# Patient Record
Sex: Female | Born: 1945
Health system: Southern US, Community
[De-identification: ages and names within clinical notes are randomized; demographics above are authoritative.]

## PROBLEM LIST (undated history)

## (undated) DIAGNOSIS — R112 Nausea with vomiting, unspecified: Secondary | ICD-10-CM

## (undated) DIAGNOSIS — F32A Depression, unspecified: Secondary | ICD-10-CM

## (undated) DIAGNOSIS — M1990 Unspecified osteoarthritis, unspecified site: Secondary | ICD-10-CM

## (undated) DIAGNOSIS — R7303 Prediabetes: Secondary | ICD-10-CM

## (undated) DIAGNOSIS — M47812 Spondylosis without myelopathy or radiculopathy, cervical region: Secondary | ICD-10-CM

## (undated) DIAGNOSIS — G2581 Restless legs syndrome: Secondary | ICD-10-CM

## (undated) DIAGNOSIS — L0390 Cellulitis, unspecified: Secondary | ICD-10-CM

## (undated) DIAGNOSIS — F329 Major depressive disorder, single episode, unspecified: Secondary | ICD-10-CM

## (undated) DIAGNOSIS — R110 Nausea: Secondary | ICD-10-CM

## (undated) DIAGNOSIS — Z8719 Personal history of other diseases of the digestive system: Secondary | ICD-10-CM

## (undated) DIAGNOSIS — G43909 Migraine, unspecified, not intractable, without status migrainosus: Secondary | ICD-10-CM

## (undated) DIAGNOSIS — M8580 Other specified disorders of bone density and structure, unspecified site: Secondary | ICD-10-CM

## (undated) DIAGNOSIS — Z9889 Other specified postprocedural states: Secondary | ICD-10-CM

## (undated) DIAGNOSIS — K219 Gastro-esophageal reflux disease without esophagitis: Secondary | ICD-10-CM

## (undated) DIAGNOSIS — E785 Hyperlipidemia, unspecified: Secondary | ICD-10-CM

## (undated) DIAGNOSIS — I700 Atherosclerosis of aorta: Secondary | ICD-10-CM

## (undated) DIAGNOSIS — E559 Vitamin D deficiency, unspecified: Secondary | ICD-10-CM

## (undated) DIAGNOSIS — T7840XA Allergy, unspecified, initial encounter: Secondary | ICD-10-CM

## (undated) DIAGNOSIS — F419 Anxiety disorder, unspecified: Secondary | ICD-10-CM

## (undated) DIAGNOSIS — R569 Unspecified convulsions: Secondary | ICD-10-CM

## (undated) DIAGNOSIS — C4490 Unspecified malignant neoplasm of skin, unspecified: Secondary | ICD-10-CM

## (undated) DIAGNOSIS — R031 Nonspecific low blood-pressure reading: Secondary | ICD-10-CM

## (undated) DIAGNOSIS — M5030 Other cervical disc degeneration, unspecified cervical region: Secondary | ICD-10-CM

## (undated) DIAGNOSIS — R55 Syncope and collapse: Secondary | ICD-10-CM

## (undated) DIAGNOSIS — Z87898 Personal history of other specified conditions: Secondary | ICD-10-CM

## (undated) DIAGNOSIS — H269 Unspecified cataract: Secondary | ICD-10-CM

## (undated) DIAGNOSIS — Z860100 Personal history of colon polyps, unspecified: Secondary | ICD-10-CM

## (undated) DIAGNOSIS — G9340 Encephalopathy, unspecified: Secondary | ICD-10-CM

## (undated) DIAGNOSIS — S22080A Wedge compression fracture of T11-T12 vertebra, initial encounter for closed fracture: Secondary | ICD-10-CM

## (undated) DIAGNOSIS — C4492 Squamous cell carcinoma of skin, unspecified: Secondary | ICD-10-CM

## (undated) DIAGNOSIS — I680 Cerebral amyloid angiopathy: Secondary | ICD-10-CM

## (undated) DIAGNOSIS — A048 Other specified bacterial intestinal infections: Secondary | ICD-10-CM

## (undated) DIAGNOSIS — G459 Transient cerebral ischemic attack, unspecified: Secondary | ICD-10-CM

## (undated) DIAGNOSIS — K648 Other hemorrhoids: Secondary | ICD-10-CM

## (undated) DIAGNOSIS — D649 Anemia, unspecified: Secondary | ICD-10-CM

## (undated) HISTORY — DX: Depression, unspecified: F32.A

## (undated) HISTORY — DX: Hyperlipidemia, unspecified: E78.5

## (undated) HISTORY — DX: Restless legs syndrome: G25.81

## (undated) HISTORY — DX: Migraine, unspecified, not intractable, without status migrainosus: G43.909

## (undated) HISTORY — DX: Unspecified osteoarthritis, unspecified site: M19.90

## (undated) HISTORY — DX: Anemia, unspecified: D64.9

## (undated) HISTORY — DX: Major depressive disorder, single episode, unspecified: F32.9

## (undated) HISTORY — DX: Anxiety disorder, unspecified: F41.9

## (undated) HISTORY — DX: Allergy, unspecified, initial encounter: T78.40XA

## (undated) HISTORY — DX: Vitamin D deficiency, unspecified: E55.9

---
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
## 1997-11-17 ENCOUNTER — Other Ambulatory Visit: Admission: RE | Admit: 1997-11-17 | Discharge: 1997-11-17 | Payer: Self-pay | Admitting: *Deleted

## 1999-06-23 ENCOUNTER — Ambulatory Visit (HOSPITAL_COMMUNITY): Admission: RE | Admit: 1999-06-23 | Discharge: 1999-06-23 | Payer: Self-pay | Admitting: Internal Medicine

## 1999-06-23 ENCOUNTER — Encounter: Payer: Self-pay | Admitting: Internal Medicine

## 2000-06-20 ENCOUNTER — Other Ambulatory Visit: Admission: RE | Admit: 2000-06-20 | Discharge: 2000-06-20 | Payer: Self-pay | Admitting: Internal Medicine

## 2000-07-17 ENCOUNTER — Ambulatory Visit (HOSPITAL_COMMUNITY): Admission: RE | Admit: 2000-07-17 | Discharge: 2000-07-17 | Payer: Self-pay | Admitting: Internal Medicine

## 2000-07-17 ENCOUNTER — Encounter: Payer: Self-pay | Admitting: Internal Medicine

## 2000-07-25 ENCOUNTER — Ambulatory Visit (HOSPITAL_COMMUNITY): Admission: RE | Admit: 2000-07-25 | Discharge: 2000-07-25 | Payer: Self-pay | Admitting: Orthopaedic Surgery

## 2000-10-09 ENCOUNTER — Ambulatory Visit (HOSPITAL_COMMUNITY): Admission: RE | Admit: 2000-10-09 | Discharge: 2000-10-09 | Payer: Self-pay | Admitting: Plastic Surgery

## 2003-11-25 ENCOUNTER — Ambulatory Visit (HOSPITAL_COMMUNITY): Admission: RE | Admit: 2003-11-25 | Discharge: 2003-11-25 | Payer: Self-pay | Admitting: Internal Medicine

## 2004-08-23 ENCOUNTER — Other Ambulatory Visit: Admission: RE | Admit: 2004-08-23 | Discharge: 2004-08-23 | Payer: Self-pay | Admitting: Internal Medicine

## 2006-10-12 ENCOUNTER — Ambulatory Visit (HOSPITAL_COMMUNITY): Admission: RE | Admit: 2006-10-12 | Discharge: 2006-10-12 | Payer: Self-pay | Admitting: Internal Medicine

## 2006-10-23 ENCOUNTER — Other Ambulatory Visit: Admission: RE | Admit: 2006-10-23 | Discharge: 2006-10-23 | Payer: Self-pay | Admitting: Internal Medicine

## 2006-11-02 ENCOUNTER — Ambulatory Visit (HOSPITAL_COMMUNITY): Admission: RE | Admit: 2006-11-02 | Discharge: 2006-11-02 | Payer: Self-pay | Admitting: Internal Medicine

## 2006-11-24 LAB — HM COLONOSCOPY

## 2007-03-25 ENCOUNTER — Emergency Department (HOSPITAL_COMMUNITY): Admission: EM | Admit: 2007-03-25 | Discharge: 2007-03-25 | Payer: Self-pay | Admitting: Emergency Medicine

## 2007-11-20 ENCOUNTER — Ambulatory Visit (HOSPITAL_COMMUNITY): Admission: RE | Admit: 2007-11-20 | Discharge: 2007-11-20 | Payer: Self-pay | Admitting: Internal Medicine

## 2008-04-17 HISTORY — PX: SPINE SURGERY: SHX786

## 2008-11-23 ENCOUNTER — Ambulatory Visit (HOSPITAL_COMMUNITY): Admission: RE | Admit: 2008-11-23 | Discharge: 2008-11-23 | Payer: Self-pay | Admitting: Internal Medicine

## 2008-12-02 ENCOUNTER — Other Ambulatory Visit: Admission: RE | Admit: 2008-12-02 | Discharge: 2008-12-02 | Payer: Self-pay | Admitting: Internal Medicine

## 2008-12-16 DIAGNOSIS — M5030 Other cervical disc degeneration, unspecified cervical region: Secondary | ICD-10-CM

## 2008-12-16 DIAGNOSIS — M47812 Spondylosis without myelopathy or radiculopathy, cervical region: Secondary | ICD-10-CM

## 2008-12-16 HISTORY — DX: Spondylosis without myelopathy or radiculopathy, cervical region: M47.812

## 2008-12-16 HISTORY — DX: Other cervical disc degeneration, unspecified cervical region: M50.30

## 2008-12-20 ENCOUNTER — Encounter: Admission: RE | Admit: 2008-12-20 | Discharge: 2008-12-20 | Payer: Self-pay | Admitting: Internal Medicine

## 2008-12-29 ENCOUNTER — Encounter: Admission: RE | Admit: 2008-12-29 | Discharge: 2008-12-29 | Payer: Self-pay | Admitting: Neurosurgery

## 2009-01-07 ENCOUNTER — Ambulatory Visit (HOSPITAL_COMMUNITY): Admission: RE | Admit: 2009-01-07 | Discharge: 2009-01-08 | Payer: Self-pay | Admitting: Orthopedic Surgery

## 2009-11-23 LAB — HM PAP SMEAR: HM Pap smear: NEGATIVE

## 2010-07-22 LAB — BASIC METABOLIC PANEL
CO2: 30 mEq/L (ref 19–32)
Calcium: 9.6 mg/dL (ref 8.4–10.5)
Chloride: 100 mEq/L (ref 96–112)
Creatinine, Ser: 0.81 mg/dL (ref 0.4–1.2)
GFR calc non Af Amer: >60 mL/min (ref >60)
Glucose, Bld: 109 mg/dL — ABNORMAL HIGH (ref 70–99)
Potassium: 3.7 mEq/L (ref 3.5–5.1)
Sodium: 136 mEq/L (ref 135–145)

## 2010-07-22 LAB — CBC
MCHC: 34.3 g/dL (ref 30.0–36.0)
RBC: 4.13 MIL/uL (ref 3.87–5.11)
WBC: 6.2 10*3/uL (ref 4.0–10.5)

## 2011-10-27 ENCOUNTER — Other Ambulatory Visit (HOSPITAL_COMMUNITY): Payer: Self-pay | Admitting: Internal Medicine

## 2011-10-27 DIAGNOSIS — Z1231 Encounter for screening mammogram for malignant neoplasm of breast: Secondary | ICD-10-CM

## 2011-11-16 ENCOUNTER — Ambulatory Visit (HOSPITAL_COMMUNITY)
Admission: RE | Admit: 2011-11-16 | Discharge: 2011-11-16 | Disposition: A | Discharge status: Home / Self Care | Payer: Medicare Other | Source: Ambulatory Visit | Attending: Internal Medicine | Admitting: Internal Medicine

## 2011-11-16 DIAGNOSIS — Z1231 Encounter for screening mammogram for malignant neoplasm of breast: Secondary | ICD-10-CM | POA: Insufficient documentation

## 2011-11-24 LAB — HM MAMMOGRAPHY: HM Mammogram: NEGATIVE

## 2011-12-04 ENCOUNTER — Other Ambulatory Visit (HOSPITAL_COMMUNITY): Payer: Self-pay | Admitting: Internal Medicine

## 2011-12-04 ENCOUNTER — Ambulatory Visit (HOSPITAL_COMMUNITY)
Admission: RE | Admit: 2011-12-04 | Discharge: 2011-12-04 | Disposition: A | Discharge status: Home / Self Care | Payer: Medicare Other | Source: Ambulatory Visit | Attending: Internal Medicine | Admitting: Internal Medicine

## 2011-12-04 DIAGNOSIS — R05 Cough: Secondary | ICD-10-CM

## 2011-12-04 DIAGNOSIS — R110 Nausea: Secondary | ICD-10-CM | POA: Insufficient documentation

## 2011-12-04 DIAGNOSIS — R0989 Other specified symptoms and signs involving the circulatory and respiratory systems: Secondary | ICD-10-CM | POA: Insufficient documentation

## 2011-12-04 DIAGNOSIS — R059 Cough, unspecified: Secondary | ICD-10-CM | POA: Insufficient documentation

## 2012-02-22 ENCOUNTER — Ambulatory Visit
Admission: RE | Admit: 2012-02-22 | Discharge: 2012-02-22 | Disposition: A | Discharge status: Home / Self Care | Payer: Medicare Other | Source: Ambulatory Visit | Attending: Internal Medicine | Admitting: Internal Medicine

## 2012-02-22 ENCOUNTER — Other Ambulatory Visit: Payer: Self-pay | Admitting: Internal Medicine

## 2012-04-17 HISTORY — PX: CATARACT EXTRACTION, BILATERAL: SHX1313

## 2013-03-25 ENCOUNTER — Encounter: Payer: Self-pay | Admitting: Physician Assistant

## 2013-03-25 DIAGNOSIS — E559 Vitamin D deficiency, unspecified: Secondary | ICD-10-CM | POA: Insufficient documentation

## 2013-03-25 DIAGNOSIS — E785 Hyperlipidemia, unspecified: Secondary | ICD-10-CM | POA: Insufficient documentation

## 2013-03-25 DIAGNOSIS — I10 Essential (primary) hypertension: Secondary | ICD-10-CM | POA: Insufficient documentation

## 2013-03-25 DIAGNOSIS — R7303 Prediabetes: Secondary | ICD-10-CM

## 2013-03-27 ENCOUNTER — Ambulatory Visit: Payer: Self-pay | Admitting: Physician Assistant

## 2013-04-17 HISTORY — PX: MOHS SURGERY: SUR867

## 2013-04-22 ENCOUNTER — Other Ambulatory Visit: Payer: Self-pay | Admitting: Internal Medicine

## 2013-04-29 ENCOUNTER — Ambulatory Visit (INDEPENDENT_AMBULATORY_CARE_PROVIDER_SITE_OTHER): Payer: 59 | Admitting: Physician Assistant

## 2013-04-29 ENCOUNTER — Encounter: Payer: Self-pay | Admitting: Physician Assistant

## 2013-04-29 VITALS — BP 110/60 | HR 88 | Temp 97.7°F | Resp 16 | Ht 65.0 in | Wt 165.0 lb

## 2013-04-29 DIAGNOSIS — I10 Essential (primary) hypertension: Secondary | ICD-10-CM

## 2013-04-29 DIAGNOSIS — R7303 Prediabetes: Secondary | ICD-10-CM

## 2013-04-29 DIAGNOSIS — E559 Vitamin D deficiency, unspecified: Secondary | ICD-10-CM

## 2013-04-29 DIAGNOSIS — Z79899 Other long term (current) drug therapy: Secondary | ICD-10-CM

## 2013-04-29 DIAGNOSIS — R7309 Other abnormal glucose: Secondary | ICD-10-CM

## 2013-04-29 DIAGNOSIS — E785 Hyperlipidemia, unspecified: Secondary | ICD-10-CM

## 2013-04-29 LAB — CBC WITH DIFFERENTIAL/PLATELET
BASOS ABS: 0.1 10*3/uL (ref 0.0–0.1)
BASOS PCT: 1 % (ref 0–1)
EOS PCT: 4 % (ref 0–5)
Eosinophils Absolute: 0.2 10*3/uL (ref 0.0–0.7)
HEMATOCRIT: 36.3 % (ref 36.0–46.0)
HEMOGLOBIN: 12.4 g/dL (ref 12.0–15.0)
LYMPHS PCT: 33 % (ref 12–46)
Lymphs Abs: 1.8 10*3/uL (ref 0.7–4.0)
MCH: 31.1 pg (ref 26.0–34.0)
MCHC: 34.2 g/dL (ref 30.0–36.0)
MCV: 91 fL (ref 78.0–100.0)
MONO ABS: 0.5 10*3/uL (ref 0.1–1.0)
MONOS PCT: 9 % (ref 3–12)
Neutro Abs: 2.9 10*3/uL (ref 1.7–7.7)
Neutrophils Relative %: 53 % (ref 43–77)
Platelets: 269 10*3/uL (ref 150–400)
RBC: 3.99 MIL/uL (ref 3.87–5.11)
RDW: 13.1 % (ref 11.5–15.5)
WBC: 5.4 10*3/uL (ref 4.0–10.5)

## 2013-04-29 LAB — LIPID PANEL
Cholesterol: 215 mg/dL — ABNORMAL HIGH (ref 0–200)
HDL: 55 mg/dL (ref >39)
LDL CALC: 112 mg/dL — AB (ref 0–99)
Total CHOL/HDL Ratio: 3.9 Ratio
Triglycerides: 242 mg/dL — ABNORMAL HIGH (ref <150)
VLDL: 48 mg/dL — ABNORMAL HIGH (ref 0–40)

## 2013-04-29 LAB — BASIC METABOLIC PANEL WITH GFR
BUN: 12 mg/dL (ref 6–23)
CHLORIDE: 103 meq/L (ref 96–112)
CO2: 25 mEq/L (ref 19–32)
CREATININE: 0.88 mg/dL (ref 0.50–1.10)
Calcium: 9.1 mg/dL (ref 8.4–10.5)
GFR, EST NON AFRICAN AMERICAN: 68 mL/min
GFR, Est African American: 79 mL/min
GLUCOSE: 86 mg/dL (ref 70–99)
POTASSIUM: 4 meq/L (ref 3.5–5.3)
Sodium: 136 mEq/L (ref 135–145)

## 2013-04-29 LAB — HEPATIC FUNCTION PANEL
ALBUMIN: 4.3 g/dL (ref 3.5–5.2)
ALK PHOS: 55 U/L (ref 39–117)
ALT: 10 U/L (ref 0–35)
AST: 18 U/L (ref 0–37)
BILIRUBIN INDIRECT: 0.3 mg/dL (ref 0.0–0.9)
Bilirubin, Direct: 0.1 mg/dL (ref 0.0–0.3)
TOTAL PROTEIN: 6.4 g/dL (ref 6.0–8.3)
Total Bilirubin: 0.4 mg/dL (ref 0.3–1.2)

## 2013-04-29 LAB — HEMOGLOBIN A1C
HEMOGLOBIN A1C: 5.5 % (ref <5.7)
MEAN PLASMA GLUCOSE: 111 mg/dL (ref <117)

## 2013-04-29 LAB — MAGNESIUM: MAGNESIUM: 2.1 mg/dL (ref 1.5–2.5)

## 2013-04-29 NOTE — Patient Instructions (Addendum)
Meloxicam is a once a day medication for pain as needed. Please take it with food or it can irritate your stomach. You can take tylenol with it but not other antiinflammatories such as aleve, ibuprofen.   Diet and Irritable Bowel Syndrome  No cure has been found for irritable bowel syndrome (IBS). Many options are available to treat the symptoms. Your caregiver will give you the best treatments available for your symptoms. He or she will also encourage you to manage stress and to make changes to your diet. You need to work with your caregiver and Registered Dietician to find the best combination of medicine, diet, counseling, and support to control your symptoms. The following are some diet suggestions. FOODS THAT MAKE IBS WORSE  Fatty foods, such as [place] fries.  Milk products, such as cheese or ice cream.  Chocolate.  Alcohol.  Caffeine (found in coffee and some sodas).  Carbonated drinks, such as soda. If certain foods cause symptoms, you should eat less of them or stop eating them. FOOD JOURNAL   Keep a journal of the foods that seem to cause distress. Write down:  What you are eating during the day and when.  What problems you are having after eating.  When the symptoms occur in relation to your meals.  What foods always make you feel badly.  Take your notes with you to your caregiver to see if you should stop eating certain foods. FOODS THAT MAKE IBS BETTER Fiber reduces IBS symptoms, especially constipation, because it makes stools soft, bulky, and easier to pass. Fiber is found in bran, bread, cereal, beans, fruit, and vegetables. Examples of foods with fiber include:  Apples.  Peaches.  Pears.  Berries.  Figs.  Broccoli, raw.  Cabbage.  Carrots.  Raw peas.  Kidney beans.  Lima beans.  Whole-grain bread.  Whole-grain cereal. Add foods with fiber to your diet a little at a time. This will let your body get used to them. Too much fiber at once might  cause gas and swelling of your abdomen. This can trigger symptoms in a person with IBS. Caregivers usually recommend a diet with enough fiber to produce soft, painless bowel movements. High fiber diets may cause gas and bloating. However, these symptoms often go away within a few weeks, as your body adjusts. In many cases, dietary fiber may lessen IBS symptoms, particularly constipation. However, it may not help pain or diarrhea. High fiber diets keep the colon mildly enlarged (distended) with the added fiber. This may help prevent spasms in the colon. Some forms of fiber also keep water in the stool, thereby preventing hard stools that are difficult to pass.  Besides telling you to eat more foods with fiber, your caregiver may also tell you to get more fiber by taking a fiber pill or drinking water mixed with a special high fiber powder. An example of this is a natural fiber laxative containing psyllium seed.  TIPS  Large meals can cause cramping and diarrhea in people with IBS. If this happens to you, try eating 4 or 5 small meals a day, or try eating less at each of your usual 3 meals. It may also help if your meals are low in fat and high in carbohydrates. Examples of carbohydrates are pasta, rice, whole-grain breads and cereals, fruits, and vegetables.  If dairy products cause your symptoms to flare up, you can try eating less of those foods. You might be able to handle yogurt better than other dairy products,  because it contains bacteria that helps with digestion. Dairy products are an important source of calcium and other nutrients. If you need to avoid dairy products, be sure to talk with a Registered Dietitian about getting these nutrients through other food sources.  Drink enough water and fluids to keep your urine clear or pale yellow. This is important, especially if you have diarrhea. FOR MORE INFORMATION  International Foundation for Functional Gastrointestinal Disorders: www.iffgd.org   National Digestive Diseases Information Clearinghouse: digestive.AmenCredit.is Document Released: 06/24/2003 Document Revised: 06/26/2011 Document Reviewed: 03/11/2007 [facility] Patient Information 2014 [place], [place].    Bad carbs also include fruit juice, alcohol, and sweet tea. These are empty calories that do not signal to your brain that you are full.   Please remember the good carbs are still carbs which convert into sugar. So please measure them out no more than 1/2-1 cup of rice, oatmeal, pasta, and beans.  Veggies are however free foods! Pile them on.   I like lean protein at every meal such as chicken, [place], pork chops, cottage cheese, etc. Just do not fry these meats and please center your meal around vegetable, the meats should be a side dish.   No all fruit is created equal. Please see the list below, the fruit at the bottom is higher in sugars than the fruit at the top

## 2013-04-29 NOTE — Progress Notes (Signed)
HPI Patient presents for 3 month follow up with hypertension, hyperlipidemia, prediabetes and vitamin D. Patient's blood pressure has been controlled at home, today their BP is BP: 110/60 mmHg  Patient denies chest pain, shortness of breath, dizziness.  Patient's cholesterol is diet controlled. In addition they are on crestor/zetia and denies myalgias. The cholesterol last visit was LDL 66, HDL 65.  The patient has been working on diet and exercise for prediabetes, and denies changes in vision, polys, and paresthesias. A1C 5.8.  GFR was 67.  Patient is on Vitamin D supplement.   Current Medications:  Current Outpatient Prescriptions on File Prior to Visit  Medication Sig Dispense Refill  . aspirin 81 MG tablet Take 81 mg by mouth daily.      . baclofen (LIORESAL) 10 MG tablet Take 10 mg by mouth 3 (three) times daily.      . calcium-vitamin D (OSCAL WITH D) 250-125 MG-UNIT per tablet Take 1 tablet by mouth daily.      . cholecalciferol (VITAMIN D) 1000 UNITS tablet Take 10,000 Units by mouth daily.      Marland Kitchen ezetimibe (ZETIA) 10 MG tablet Take 10 mg by mouth daily.      . meloxicam (MOBIC) 15 MG tablet TAKE ONE TABLET BY MOUTH EVERY DAY AFTER A MEAL AS NEEDED FOR PAIN AND INFLAMMATION  90 tablet  0  . rosuvastatin (CRESTOR) 20 MG tablet Take 20 mg by mouth daily.      . sertraline (ZOLOFT) 100 MG tablet Take 100 mg by mouth daily.      . SUMAtriptan (IMITREX) 20 MG/ACT nasal spray Place 20 mg into the nose every 2 (two) hours as needed for migraine or headache. May repeat in 2 hours if headache persists or recurs.      . topiramate (TOPAMAX) 100 MG tablet Take 100 mg by mouth 2 (two) times daily.       No current facility-administered medications on file prior to visit.   Medical History:  Past Medical History  Diagnosis Date  . Hypertension   . Hyperlipidemia   . Arthritis   . Allergy   . Migraines   . Vitamin D deficiency   . Prediabetes   . RLS (restless legs syndrome)   . Anxiety    . Depression   . Anemia    Allergies:  Allergies  Allergen Reactions  . Banana     Nausea and vomiting  . Effexor [Venlafaxine]     insomnia  . Tussin [Guaifenesin]     rash  . Zostavax [Zoster Vaccine Live]     ROS Constitutional: Denies fever, chills, headaches, insomnia, fatigue, night sweats Eyes: Denies redness, blurred vision, diplopia, discharge, itchy, watery eyes.  ENT: Denies congestion, post nasal drip, sore throat, earache, dental pain, Tinnitus, Vertigo, Sinus pain, snoring.  Cardio: Denies chest pain, palpitations, irregular heartbeat, dyspnea, diaphoresis, orthopnea, PND, claudication, edema Respiratory: denies cough, shortness of breath, wheezing.  Gastrointestinal: Denies dysphagia, heartburn, AB pain/ cramps, N/V, diarrhea, constipation, hematemesis, melena, hematochezia,  hemorrhoids Genitourinary: Denies dysuria, frequency, urgency, nocturia, hesitancy, discharge, hematuria, flank pain Musculoskeletal: Denies myalgia, stiffness, pain, swelling and strain/sprain. Skin: Denies pruritis, rash, changing in skin lesion Neuro: Denies Weakness, tremor, incoordination, spasms, pain Psychiatric: Denies confusion, memory loss, sensory loss Endocrine: Denies change in weight, skin, hair change, nocturia Diabetic Polys, Denies visual blurring, hyper /hypo glycemic episodes, and paresthesia, Heme/Lymph: Denies Excessive bleeding, bruising, enlarged lymph nodes  Family history- Review and unchanged Social history- Review and unchanged Physical Exam:  Filed Vitals:   04/29/13 1046  BP: 110/60  Pulse: 88  Temp: 97.7 F (36.5 C)  Resp: 16   Filed Weights   04/29/13 1046  Weight: 165 lb (74.844 kg)   General Appearance: Well nourished, in no apparent distress. Eyes: PERRLA, EOMs, conjunctiva no swelling or erythema Sinuses: No Frontal/maxillary tenderness ENT/Mouth: Ext aud canals clear, TMs without erythema, bulging. No erythema, swelling, or exudate on post  pharynx.  Tonsils not swollen or erythematous. Hearing normal.  Neck: Supple, thyroid normal.  Respiratory: Respiratory effort normal, BS equal bilaterally without rales, rhonchi, wheezing or stridor.  Cardio: RRR with no MRGs. Brisk peripheral pulses without edema.  Abdomen: Soft, + BS.  Non tender, no guarding, rebound, hernias, masses. Lymphatics: Non tender without lymphadenopathy.  Musculoskeletal: Full ROM, 5/5 strength, normal gait.  Skin: Warm, dry without rashes, lesions, ecchymosis.  Neuro: Cranial nerves intact. Normal muscle tone, no cerebellar symptoms. Sensation intact.  Psych: Awake and oriented X 3, normal affect, Insight and Judgment appropriate.   Assessment and Plan:  Hypertension: Continue medication, monitor blood pressure at home.  Continue DASH diet. Cholesterol: Continue diet and exercise. Check cholesterol.  Pre-diabetes-Continue diet and exercise. Check A1C Vitamin D Def- check level and continue medications.   Continue diet and meds as discussed. Further disposition pending results of labs.  Vicie Mutters 11:06 AM

## 2013-04-30 LAB — TSH: TSH: 3.971 u[IU]/mL (ref 0.350–4.500)

## 2013-04-30 LAB — VITAMIN D 25 HYDROXY (VIT D DEFICIENCY, FRACTURES): VIT D 25 HYDROXY: 80 ng/mL (ref 30–89)

## 2013-06-12 ENCOUNTER — Other Ambulatory Visit: Payer: Self-pay | Admitting: Internal Medicine

## 2013-07-01 ENCOUNTER — Encounter: Payer: Self-pay | Admitting: Internal Medicine

## 2013-07-01 ENCOUNTER — Ambulatory Visit (INDEPENDENT_AMBULATORY_CARE_PROVIDER_SITE_OTHER): Payer: 59 | Admitting: Internal Medicine

## 2013-07-01 VITALS — BP 116/74 | HR 88 | Temp 98.8°F | Resp 16 | Ht 65.75 in | Wt 165.0 lb

## 2013-07-01 DIAGNOSIS — I10 Essential (primary) hypertension: Secondary | ICD-10-CM

## 2013-07-01 DIAGNOSIS — R7309 Other abnormal glucose: Secondary | ICD-10-CM

## 2013-07-01 DIAGNOSIS — E785 Hyperlipidemia, unspecified: Secondary | ICD-10-CM

## 2013-07-01 DIAGNOSIS — R7303 Prediabetes: Secondary | ICD-10-CM | POA: Insufficient documentation

## 2013-07-01 DIAGNOSIS — Z79899 Other long term (current) drug therapy: Secondary | ICD-10-CM | POA: Insufficient documentation

## 2013-07-01 DIAGNOSIS — E559 Vitamin D deficiency, unspecified: Secondary | ICD-10-CM

## 2013-07-01 LAB — CBC WITH DIFFERENTIAL/PLATELET
Basophils Absolute: 0.1 10*3/uL (ref 0.0–0.1)
Basophils Relative: 1 % (ref 0–1)
EOS PCT: 4 % (ref 0–5)
Eosinophils Absolute: 0.2 10*3/uL (ref 0.0–0.7)
HCT: 35 % — ABNORMAL LOW (ref 36.0–46.0)
Hemoglobin: 12 g/dL (ref 12.0–15.0)
LYMPHS ABS: 1.7 10*3/uL (ref 0.7–4.0)
LYMPHS PCT: 31 % (ref 12–46)
MCH: 29.8 pg (ref 26.0–34.0)
MCHC: 34.3 g/dL (ref 30.0–36.0)
MCV: 86.8 fL (ref 78.0–100.0)
MONO ABS: 0.4 10*3/uL (ref 0.1–1.0)
Monocytes Relative: 8 % (ref 3–12)
Neutro Abs: 3.1 10*3/uL (ref 1.7–7.7)
Neutrophils Relative %: 56 % (ref 43–77)
Platelets: 300 10*3/uL (ref 150–400)
RBC: 4.03 MIL/uL (ref 3.87–5.11)
RDW: 13.3 % (ref 11.5–15.5)
WBC: 5.6 10*3/uL (ref 4.0–10.5)

## 2013-07-01 MED ORDER — PHENTERMINE HCL 37.5 MG PO TABS: ORAL_TABLET | ORAL | Status: DC

## 2013-07-01 NOTE — Progress Notes (Signed)
Patient ID: Deborah [name], female   DOB: Jul 06, 1945, 68 y.o.   MRN: [id number]    This very nice 68 y.o. MWF presents for 3 month follow up with Hypertension, Hyperlipidemia, Pre-Diabetes, Migraine and Vitamin D Deficiency.    Patient has Hx/o labile HTN which has been monitored expectantly for years. Today's BP: 116/74 mmHg . Patient denies any cardiac type chest pain, palpitations, dyspnea/orthopnea/PND, dizziness, claudication, or dependent edema. Patient's migraines have been controlled with her Topomax.   Hyperlipidemia is not fully controlled with diet & meds. Last lipids in Jan 2015 were still not quite at goal. Patient denies myalgias or other med SE's.  Lab Results  Component Value Date   CHOL 215* 04/29/2013   HDL 55 04/29/2013   LDLCALC 112* 04/29/2013   TRIG 242* 04/29/2013   CHOLHDL 3.9 04/29/2013    Also, the patient has history of PreDiabetes with A1c 6.1% in Nov 2011 and with last A1c of 5.5% in Jan 2014. Patient denies any symptoms of reactive hypoglycemia, diabetic polys, paresthesias or visual blurring.   Further, Patient has history of Vitamin D Deficiency of 18 in 2008 with last vitamin D of 101 in Jan 2014. Patient supplements vitamin D without any suspected side-effects.  Medication Sig  . aspirin 81 MG tablet Take 81 mg by mouth daily.  . calcium-vitamin D (OSCAL WITH D) 250-125 MG-UNIT per tablet Take 1 tablet by mouth daily.  Marland Kitchen ezetimibe (ZETIA) 10 MG tablet Take 10 mg by mouth daily.  . meloxicam (MOBIC) 15 MG tablet TAKE ONE TAB daily AS NEEDED FOR PAIN   . rosuvastatin (CRESTOR) 20 MG tablet Take 20 mg by mouth daily.  . sertraline (ZOLOFT) 100 MG tablet TAKE ONE TABLET BY MOUTH EVERY DAY  . SUMAtriptan (IMITREX) 20 MG/ACT nasal spray Place 20 mg into the nose every 2 (two) hours as needed for migraine or headache. May repeat in 2 hours if headache persists or recurs.  . topiramate (TOPAMAX) 100 MG tablet Take 100 mg by mouth 2 (two) times daily.     Allergies   Allergen Reactions  . Banana     Nausea and vomiting  . Effexor [Venlafaxine]     insomnia  . Tussin [Guaifenesin]     rash  . Zostavax [Zoster Vaccine Live]     PMHx:   Past Medical History  Diagnosis Date  . Hypertension   . Hyperlipidemia   . Arthritis   . Allergy   . Migraines   . Vitamin D deficiency   . Prediabetes   . RLS (restless legs syndrome)   . Anxiety   . Depression   . Anemia     FHx:    Reviewed / unchanged  SHx:    Reviewed / unchanged   Systems Review: Constitutional: Denies fever, chills, wt changes, headaches, insomnia, fatigue, night sweats, change in appetite. Eyes: Denies redness, blurred vision, diplopia, discharge, itchy, watery eyes.  ENT: Denies discharge, congestion, post nasal drip, epistaxis, sore throat, earache, hearing loss, dental pain, tinnitus, vertigo, sinus pain, snoring.  CV: Denies chest pain, palpitations, irregular heartbeat, syncope, dyspnea, diaphoresis, orthopnea, PND, claudication, edema. Respiratory: denies cough, dyspnea, DOE, pleurisy, hoarseness, laryngitis, wheezing.  Gastrointestinal: Denies dysphagia, odynophagia, heartburn, reflux, water brash, abdominal pain or cramps, nausea, vomiting, bloating, diarrhea, constipation, hematemesis, melena, hematochezia,  or hemorrhoids. Genitourinary: Denies dysuria, frequency, urgency, nocturia, hesitancy, discharge, hematuria, flank pain. Musculoskeletal: Denies arthralgias, myalgias, stiffness, jt. swelling, pain, limp, strain/sprain.  Skin: Denies pruritus, rash, hives, warts, acne, eczema,  change in skin lesion(s). Neuro: No weakness, tremor, incoordination, spasms, paresthesia, or pain. Psychiatric: Denies confusion, memory loss, or sensory loss. Endo: Denies change in weight, skin, hair change.  Heme/Lymph: No excessive bleeding, bruising, orenlarged lymph nodes.   Exam:  BP 116/74  Pulse 88  Temp 98.8 F  Resp 16  Ht 5' 5.75"   Wt 165 lb   BMI 26.84  kg/m2  Appears well nourished - in no distress. Eyes: PERRLA, EOMs, conjunctiva no swelling or erythema. Sinuses: No frontal/maxillary tenderness ENT/Mouth: EAC's clear, TM's nl w/o erythema, bulging. Nares clear w/o erythema, swelling, exudates. Oropharynx clear without erythema or exudates. Oral hygiene is good. Tongue normal, non obstructing. Hearing intact.  Neck: Supple. Thyroid nl. Car 2+/2+ without bruits, nodes or JVD. Chest: Respirations nl with BS clear & equal w/o rales, rhonchi, wheezing or stridor.  Cor: Heart sounds normal w/ regular rate and rhythm without sig. murmurs, gallops, clicks, or rubs. Peripheral pulses normal and equal  without edema.  Abdomen: Soft & bowel sounds normal. Non-tender w/o guarding, rebound, hernias, masses, or organomegaly.  Musculoskeletal: Full ROM all peripheral extremities, joint stability, 5/5 strength, and normal gait.  Skin: Warm, dry without exposed rashes, lesions, ecchymosis apparent.  Neuro: Cranial nerves intact, reflexes equal bilaterally. Sensory-motor testing grossly intact. Tendon reflexes grossly intact.  Pysch: Alert & oriented x 3. Insight and judgement nl & appropriate. No ideations.  Assessment and Plan:  1. Labile HTN- Continue monitor blood pressure at home. Continue diet/meds same.  2. Hyperlipidemia - Continue diet/meds, exercise,& lifestyle modifications. Continue monitor periodic cholesterol/liver & renal functions   3. Pre-diabetes - Continue diet, exercise, lifestyle modifications. Monitor appropriate labs.  4. Vitamin D Deficiency - Continue supplementation.  Recommended regular exercise, BP monitoring, weight control, and discussed med and SE's. Recommended labs to assess and monitor clinical status. Further disposition pending results of labs. Patient given RF of Phentermine

## 2013-07-01 NOTE — Patient Instructions (Signed)

## 2013-07-02 LAB — BASIC METABOLIC PANEL WITH GFR
BUN: 14 mg/dL (ref 6–23)
CHLORIDE: 104 meq/L (ref 96–112)
CO2: 26 mEq/L (ref 19–32)
Calcium: 9.6 mg/dL (ref 8.4–10.5)
Creat: 0.86 mg/dL (ref 0.50–1.10)
GFR, EST AFRICAN AMERICAN: 81 mL/min
GFR, Est Non African American: 70 mL/min
GLUCOSE: 95 mg/dL (ref 70–99)
POTASSIUM: 4.6 meq/L (ref 3.5–5.3)
SODIUM: 135 meq/L (ref 135–145)

## 2013-07-02 LAB — LIPID PANEL
CHOLESTEROL: 221 mg/dL — AB (ref 0–200)
HDL: 72 mg/dL (ref >39)
LDL Cholesterol: 120 mg/dL — ABNORMAL HIGH (ref 0–99)
TRIGLYCERIDES: 146 mg/dL (ref <150)
Total CHOL/HDL Ratio: 3.1 Ratio
VLDL: 29 mg/dL (ref 0–40)

## 2013-07-02 LAB — HEPATIC FUNCTION PANEL
ALK PHOS: 62 U/L (ref 39–117)
ALT: 12 U/L (ref 0–35)
AST: 20 U/L (ref 0–37)
Albumin: 4.5 g/dL (ref 3.5–5.2)
BILIRUBIN DIRECT: 0.1 mg/dL (ref 0.0–0.3)
Indirect Bilirubin: 0.3 mg/dL (ref 0.2–1.2)
Total Bilirubin: 0.4 mg/dL (ref 0.2–1.2)
Total Protein: 6.8 g/dL (ref 6.0–8.3)

## 2013-07-02 LAB — HEMOGLOBIN A1C
Hgb A1c MFr Bld: 5.8 % — ABNORMAL HIGH (ref <5.7)
Mean Plasma Glucose: 120 mg/dL — ABNORMAL HIGH (ref <117)

## 2013-07-02 LAB — MAGNESIUM: Magnesium: 2.3 mg/dL (ref 1.5–2.5)

## 2013-07-02 LAB — VITAMIN D 25 HYDROXY (VIT D DEFICIENCY, FRACTURES): Vit D, 25-Hydroxy: 76 ng/mL (ref 30–89)

## 2013-07-02 LAB — INSULIN, FASTING: Insulin fasting, serum: 11 u[IU]/mL (ref 3–28)

## 2013-07-02 LAB — TSH: TSH: 2.457 u[IU]/mL (ref 0.350–4.500)

## 2013-08-20 ENCOUNTER — Other Ambulatory Visit: Payer: Self-pay | Admitting: Emergency Medicine

## 2013-09-30 ENCOUNTER — Encounter: Payer: Self-pay | Admitting: Emergency Medicine

## 2013-10-02 ENCOUNTER — Ambulatory Visit: Payer: Self-pay | Admitting: Emergency Medicine

## 2013-11-23 ENCOUNTER — Other Ambulatory Visit: Payer: Self-pay | Admitting: Physician Assistant

## 2013-12-18 ENCOUNTER — Other Ambulatory Visit: Payer: Self-pay | Admitting: Internal Medicine

## 2013-12-18 DIAGNOSIS — Z1231 Encounter for screening mammogram for malignant neoplasm of breast: Secondary | ICD-10-CM

## 2013-12-25 ENCOUNTER — Ambulatory Visit (HOSPITAL_COMMUNITY)
Admission: RE | Admit: 2013-12-25 | Discharge: 2013-12-25 | Disposition: A | Discharge status: Home / Self Care | Payer: Medicare Other | Source: Ambulatory Visit | Attending: Internal Medicine | Admitting: Internal Medicine

## 2013-12-25 DIAGNOSIS — Z1231 Encounter for screening mammogram for malignant neoplasm of breast: Secondary | ICD-10-CM | POA: Diagnosis present

## 2014-01-01 ENCOUNTER — Encounter: Payer: Self-pay | Admitting: Internal Medicine

## 2014-01-01 ENCOUNTER — Ambulatory Visit (INDEPENDENT_AMBULATORY_CARE_PROVIDER_SITE_OTHER): Payer: Medicare Other | Admitting: Internal Medicine

## 2014-01-01 VITALS — BP 118/80 | HR 88 | Temp 99.1°F | Resp 16 | Ht 66.5 in | Wt 165.0 lb

## 2014-01-01 DIAGNOSIS — I10 Essential (primary) hypertension: Secondary | ICD-10-CM

## 2014-01-01 DIAGNOSIS — Z1331 Encounter for screening for depression: Secondary | ICD-10-CM

## 2014-01-01 DIAGNOSIS — E559 Vitamin D deficiency, unspecified: Secondary | ICD-10-CM

## 2014-01-01 DIAGNOSIS — Z23 Encounter for immunization: Secondary | ICD-10-CM

## 2014-01-01 DIAGNOSIS — R7309 Other abnormal glucose: Secondary | ICD-10-CM

## 2014-01-01 DIAGNOSIS — Z789 Other specified health status: Secondary | ICD-10-CM

## 2014-01-01 DIAGNOSIS — E785 Hyperlipidemia, unspecified: Secondary | ICD-10-CM

## 2014-01-01 DIAGNOSIS — Z79899 Other long term (current) drug therapy: Secondary | ICD-10-CM

## 2014-01-01 DIAGNOSIS — Z0000 Encounter for general adult medical examination without abnormal findings: Secondary | ICD-10-CM

## 2014-01-01 DIAGNOSIS — Z1212 Encounter for screening for malignant neoplasm of rectum: Secondary | ICD-10-CM

## 2014-01-01 LAB — CBC WITH DIFFERENTIAL/PLATELET
BASOS ABS: 0.1 10*3/uL (ref 0.0–0.1)
BASOS PCT: 1 % (ref 0–1)
Eosinophils Absolute: 0.3 10*3/uL (ref 0.0–0.7)
Eosinophils Relative: 4 % (ref 0–5)
HEMATOCRIT: 36.5 % (ref 36.0–46.0)
Hemoglobin: 12.5 g/dL (ref 12.0–15.0)
LYMPHS PCT: 29 % (ref 12–46)
Lymphs Abs: 2 10*3/uL (ref 0.7–4.0)
MCH: 30.4 pg (ref 26.0–34.0)
MCHC: 34.2 g/dL (ref 30.0–36.0)
MCV: 88.8 fL (ref 78.0–100.0)
Monocytes Absolute: 0.5 10*3/uL (ref 0.1–1.0)
Monocytes Relative: 8 % (ref 3–12)
NEUTROS PCT: 58 % (ref 43–77)
Neutro Abs: 3.9 10*3/uL (ref 1.7–7.7)
PLATELETS: 289 10*3/uL (ref 150–400)
RBC: 4.11 MIL/uL (ref 3.87–5.11)
RDW: 13.2 % (ref 11.5–15.5)
WBC: 6.8 10*3/uL (ref 4.0–10.5)

## 2014-01-01 NOTE — Progress Notes (Signed)
Patient ID: Deborah [name], female   DOB: 1945/07/01, 68 y.o.   MRN: [id number]  [facility] VISIT AND CPE  Assessment:   1. Routine general medical examination at a health care facility  2. Essential hypertension  - Microalbumin / creatinine urine ratio - EKG 12-Lead - [place], RETROPERITNL ABD,  LTD - TSH  3. Hyperlipidemia  - Lipid panel  4. PreDiabetes  - Hemoglobin A1c - Insulin, fasting  5. Vitamin D deficiency  - Vit D  25 hydroxy (rtn osteoporosis monitoring)  6. Encounter for long-term (current) use of other medications  - Urine Microscopic - CBC with Differential - BASIC METABOLIC PANEL WITH GFR - Hepatic function panel - Magnesium  7. Screening for malignant neoplasm of the rectum  - POC Hemoccult Bld/Stl (3-Cd Home Screen); Future  8. Other specified conditions influencing health status(V49.89)  -Screening for falls  9. Screening for depression   10. Need for prophylactic vaccination and inoculation against influenza  - Flu vaccine HIGH DOSE PF (Fluzone Tri High dose)  Plan:   During the course of the visit the patient was educated and counseled about appropriate screening and preventive services including:    Pneumococcal vaccine   Influenza vaccine  Td vaccine  Screening electrocardiogram  Screening mammography  Bone densitometry screening  Colorectal cancer screening  Diabetes screening  Glaucoma screening  Nutrition counseling   Advanced directives: given information/requested  Screening recommendations, referrals:  Vaccinations: Tdap vaccine not indicated Influenza vaccine not indicated Pneumococcal vaccine not indicated Shingles vaccine not indicated Hep B vaccine not indicated  Nutrition assessed and recommended  Colonoscopy not indicated Mammogram not indicated Pap smear not indicated Pelvic exam not indicated Recommended yearly ophthalmology/optometry visit for glaucoma screening and  checkup Recommended yearly dental visit for hygiene and checkup Advanced directives - undecided  Conditions/risks identified: BMI: Discussed weight loss, diet, and increase physical activity.  Increase physical activity: AHA recommends 150 minutes of physical activity a week.  Medications reviewed DEXA- not indicated PreDiabetes at goal Urinary Incontinence is not an issue: discussed non pharmacology and pharmacology options.  Fall risk: low- discussed PT, home fall assessment, medications.   Subjective:   Deborah [name] is a 68 y.o. MWF who presents for Medicare Annual Wellness Visit and complete physical.    Date of last medicare wellness visit is unknown.  She has had labile elevated blood pressure for years and has been monitored expectantly. Her blood pressure has been controlled at home, today their BP is BP: 118/80 mmHg She does workout. She denies chest pain, shortness of breath, dizziness.  She is on cholesterol medication and denies myalgias. Her cholesterol is not at goal. The cholesterol last visit was:   Lab Results  Component Value Date   CHOL 221* 07/01/2013   HDL 72 07/01/2013   LDLCALC 120* 07/01/2013   TRIG 146 07/01/2013   CHOLHDL 3.1 07/01/2013   She has had dPreDabetes for 5 years since 2010. She has been working on diet and exercise for prediabetes, and denies foot ulcerations, hyperglycemia, hypoglycemia , nausea, paresthesia of the feet, polydipsia, polyuria, visual disturbances and weight loss. Last A1C in the office was:  Lab Results  Component Value Date   HGBA1C 5.8* 07/01/2013   Patient is on Vitamin D supplement.   Lab Results  Component Value Date   VD25OH 76 07/01/2013     Names of Other Physician/Practitioners you currently use: 1. [facility]- here for primary care 2. Dr Shirley Muscat, eye  doctor, last visit 2015 3. Dr Randie Heinz, dentist, last visit 2015  Patient Care Team: Unk Pinto, MD as PCP - General  (Internal Medicine) Mayme Genta, MD as Consulting Physician (Gastroenterology) Calton Dach, MD as Referring Physician (Optometry)  Medication Review Medication Sig  . aspirin 81 MG tab Take 81 mg  daily.  Marland Kitchen VITAMIN D  Take 5,000 Units daily.  Marland Kitchen MOBIC 15 MG tablet TAKE ONE TAB DAILY AS NEEDED FOR PAIN   . CRESTOR 20 MG tablet Take 20 mg  daily.  . IMITREX 20 MG nasal spray Place 20 mg into the nose every 2 (two) hours as needed   Current Problems (verified) Patient Active Problem List   Diagnosis Date Noted  . PreDiabetes 07/01/2013  . Encounter for long-term (current) use of other medications 07/01/2013  . Hypertension   . Hyperlipidemia   . Vitamin D deficiency    Screening Tests Health Maintenance  Topic Date Due  . Influenza Vaccine  11/15/2013  . Mammogram  12/26/2015  . Colonoscopy  11/23/2016  . Tetanus/tdap  12/27/2022  . Pneumococcal Polysaccharide Vaccine Age 70 And Over  Completed  . Zostavax  Completed   Immunization History  Administered Date(s) Administered  . Influenza, High Dose Seasonal PF 01/01/2014  . Influenza-Unspecified 12/26/2012  . Pneumococcal-Unspecified 12/26/2012  . Td 12/26/2012  . Zoster 12/08/2010   Preventative care: Last colonoscopy: 11/2006 Last mammogram: 12/2013 Last pap smear/pelvic exam: 2013   DEXA:10/2006  Prior vaccinations: TD : 12/2012  Influenza: 01/01/2014  Pneumococcal: 12/2012 Shingles/Zostavax: 11/2010  History reviewed: allergies, current medications, past family history, past medical history, past social history, past surgical history and problem list  Risk Factors: Osteoporosis: postmenopausal estrogen deficiency and amenorrhea History of fracture in the past year: no  Tobacco History  Substance Use Topics  . Smoking status: Former Smoker -- 1.00 packs/day for 4 years    Types: Cigarettes    Quit date: 03/25/1986  . Smokeless tobacco: Never Used  . Alcohol Use: Yes     Comment: rare   She does  not smoke.  Patient is a former smoker. Are there smokers in your home (other than you)?  Yes  Alcohol Current alcohol use: glass of wine with dinner  Caffeine Current caffeine use: coffee occas /day  Exercise  Current exercise: walking  Nutrition/Diet Current diet: in general, a "healthy" diet    Cardiac risk factors: advanced age (older than 38 for men, 21 for women), dyslipidemia, hypertension and sedentary lifestyle.  Depression Screen (Note: if answer to either of the following is "Yes", a more complete depression screening is indicated)   Q1: Over the past two weeks, have you felt down, depressed or hopeless? No  Q2: Over the past two weeks, have you felt little interest or pleasure in doing things? No  Have you lost interest or pleasure in daily life? No  Do you often feel hopeless? No  Do you cry easily over simple problems? No  Activities of Daily Living In your present state of health, do you have any difficulty performing the following activities?:  Driving? No Managing money?  No Feeding yourself? No Getting from bed to chair? No Climbing a flight of stairs? No Preparing food and eating?: No Bathing or showering? No Getting dressed: No Getting to the toilet? No Using the toilet:No Moving around from place to place: No In the past year have you fallen or had a near fall?:No   Are you sexually active?  Yes  Do you have more than one partner?  No  Vision Difficulties: No  Hearing Difficulties: No Do you often ask people to speak up or repeat themselves? No Do you experience ringing or noises in your ears? No Do you have difficulty understanding soft or whispered voices? No  Cognition  Do you feel that you have a problem with memory?No  Do you often misplace items? No  Do you feel safe at home?  No  Advanced directives Does patient have a Health Care Power of Attorney? No Does patient have a Living Will? No   Objective:     Blood pressure 118/80,  pulse 88, temperature 99.1 F (37.3 C), temperature source Temporal, resp. rate 16, height 5' 6.5" (1.689 m), weight 165 lb (74.844 kg). Body mass index is 26.24 kg/(m^2).  General appearance: alert, no distress, WD/WN,  female Cognitive Testing  Alert? Yes  Normal Appearance?Yes  Oriented to person? Yes  Place? Yes   Time? Yes  Recall of three objects?  Yes  Can perform simple calculations? Yes  Displays appropriate judgment?Yes  Can read the correct time from a watch face?Yes  HEENT: normocephalic, sclerae anicteric, TMs pearly, nares patent, no discharge or erythema, pharynx normal Oral cavity: MMM, no lesions Neck: supple, no lymphadenopathy, no thyromegaly, no masses Heart: RRR, normal S1, S2, no murmurs Lungs: CTA bilaterally, no wheezes, rhonchi, or rales Abdomen: +bs, soft, non tender, non distended, no masses, no hepatomegaly, no splenomegaly Musculoskeletal: nontender, no swelling, no obvious deformity Extremities: no edema, no cyanosis, no clubbing Pulses: 2+ symmetric, upper and lower extremities, normal cap refill Neurological: alert, oriented x 3, CN2-12 intact, strength normal upper extremities and lower extremities, sensation normal throughout, DTRs 2+ throughout, no cerebellar signs, gait normal Psychiatric: normal affect, behavior normal, pleasant  Breast:  nontender, no masses or lumps, no skin changes, no nipple discharge or inversion, no axillary lymphadenopathy Gyn: defer  Rectal: defer  Medicare Attestation I have personally reviewed: The patient's medical and social history Their use of alcohol, tobacco or illicit drugs Their current medications and supplements The patient's functional ability including ADLs,fall risks, home safety risks, cognitive, and hearing and visual impairment Diet and physical activities Evidence for depression or mood disorders  The patient's weight, height, BMI, and visual acuity have been recorded in the chart.  I have made  referrals, counseling, and provided education to the patient based on review of the above and I have provided the patient with a written personalized care plan for preventive services.    Zariah Jost DAVID, MD   01/01/2014

## 2014-01-01 NOTE — Patient Instructions (Signed)
Recommend the book "The END of DIETING" by Dr Baker Janus   and the book "The END of DIABETES " by Dr Excell Seltzer  At [email or web address] - get book & Audio CD's      Being diabetic has a  300% increased risk for heart attack, stroke, cancer, and alzheimer- type vascular dementia. It is very important that you work harder with diet by avoiding all foods that are white except chicken & fish. Avoid white rice (brown & wild rice is OK), white potatoes (sweetpotatoes in moderation is OK), White bread or wheat bread or anything made out of white flour like bagels, donuts, rolls, buns, biscuits, cakes, pastries, cookies, pizza crust, and pasta (made from white flour & egg whites) - vegetarian pasta or spinach or wheat pasta is OK. Multigrain breads like Arnold's or [employer], or multigrain sandwich thins or flatbreads.  Diet, exercise and weight loss can reverse and cure diabetes in the early stages.  Diet, exercise and weight loss is very important in the control and prevention of complications of diabetes which affects every system in your body, ie. Brain - dementia/stroke, eyes - glaucoma/blindness, heart - heart attack/heart failure, kidneys - dialysis, stomach - gastric paralysis, intestines - malabsorption, nerves - severe painful neuritis, circulation - gangrene & loss of a leg(s), and finally cancer and Alzheimers.    I recommend avoid fried & greasy foods,  sweets/candy, white rice (brown or wild rice or Quinoa is OK), white potatoes (sweet potatoes are OK) - anything made from white flour - bagels, doughnuts, rolls, buns, biscuits,white and wheat breads, pizza crust and traditional pasta made of white flour & egg white(vegetarian pasta or spinach or wheat pasta is OK).  Multi-grain bread is OK - like multi-grain flat bread or sandwich thins. Avoid alcohol in excess. Exercise is also important.    Eat all the vegetables you want - avoid meat, especially red meat and dairy - especially cheese.  Cheese  is the most concentrated form of trans-fats which is the worst thing to clog up our arteries. Veggie cheese is OK which can be found in the fresh produce section at [employer] or [employer] or [employer]  Preventive Care for Adults A healthy lifestyle and preventive care can promote health and wellness. Preventive health guidelines for women include the following key practices.  A routine yearly physical is a good way to check with your health care provider about your health and preventive screening. It is a chance to share any concerns and updates on your health and to receive a thorough exam.  Visit your dentist for a routine exam and preventive care every 6 months. Brush your teeth twice a day and floss once a day. Good oral hygiene prevents tooth decay and gum disease.  The frequency of eye exams is based on your age, health, family medical history, use of contact lenses, and other factors. Follow your health care provider's recommendations for frequency of eye exams.  Eat a healthy diet. Foods like vegetables, fruits, whole grains, low-fat dairy products, and lean protein foods contain the nutrients you need without too many calories. Decrease your intake of foods high in solid fats, added sugars, and salt. Eat the right amount of calories for you.Get information about a proper diet from your health care provider, if necessary.  Regular physical exercise is one of the most important things you can do for your health. Most adults should get at least 150 minutes of moderate-intensity exercise (any activity that increases  your heart rate and causes you to sweat) each week. In addition, most adults need muscle-strengthening exercises on 2 or more days a week.  Maintain a healthy weight. The body mass index (BMI) is a screening tool to identify possible weight problems. It provides an estimate of body fat based on height and weight. Your health care provider can find your BMI and can help you  achieve or maintain a healthy weight.For adults 20 years and older:  A BMI below 18.5 is considered underweight.  A BMI of 18.5 to 24.9 is normal.  A BMI of 25 to 29.9 is considered overweight.  A BMI of 30 and above is considered obese.  Maintain normal blood lipids and cholesterol levels by exercising and minimizing your intake of saturated fat. Eat a balanced diet with plenty of fruit and vegetables. Blood tests for lipids and cholesterol should begin at age 43 and be repeated every 5 years. If your lipid or cholesterol levels are high, you are over 50, or you are at high risk for heart disease, you may need your cholesterol levels checked more frequently.Ongoing high lipid and cholesterol levels should be treated with medicines if diet and exercise are not working.  If you smoke, find out from your health care provider how to quit. If you do not use tobacco, do not start.  Lung cancer screening is recommended for adults aged 40-80 years who are at high risk for developing lung cancer because of a history of smoking. A yearly low-dose CT scan of the lungs is recommended for people who have at least a 30-pack-year history of smoking and are a current smoker or have quit within the past 15 years. A pack year of smoking is smoking an average of 1 pack of cigarettes a day for 1 year (for example: 1 pack a day for 30 years or 2 packs a day for 15 years). Yearly screening should continue until the smoker has stopped smoking for at least 15 years. Yearly screening should be stopped for people who develop a health problem that would prevent them from having lung cancer treatment.  If you are pregnant, do not drink alcohol. If you are breastfeeding, be very cautious about drinking alcohol. If you are not pregnant and choose to drink alcohol, do not have more than 1 drink per day. One drink is considered to be 12 ounces (355 mL) of beer, 5 ounces (148 mL) of wine, or 1.5 ounces (44 mL) of liquor.  Avoid  use of street drugs. Do not share needles with anyone. Ask for help if you need support or instructions about stopping the use of drugs.  High blood pressure causes heart disease and increases the risk of stroke. Your blood pressure should be checked at least every 1 to 2 years. Ongoing high blood pressure should be treated with medicines if weight loss and exercise do not work.  If you are 74-43 years old, ask your health care provider if you should take aspirin to prevent strokes.  Diabetes screening involves taking a blood sample to check your fasting blood sugar level. This should be done once every 3 years, after age 105, if you are within normal weight and without risk factors for diabetes. Testing should be considered at a younger age or be carried out more frequently if you are overweight and have at least 1 risk factor for diabetes.  Breast cancer screening is essential preventive care for women. You should practice "breast self-awareness." This means understanding the  normal appearance and feel of your breasts and may include breast self-examination. Any changes detected, no matter how small, should be reported to a health care provider. Women in their 77s and 30s should have a clinical breast exam (CBE) by a health care provider as part of a regular health exam every 1 to 3 years. After age 52, women should have a CBE every year. Starting at age 59, women should consider having a mammogram (breast X-ray test) every year. Women who have a family history of breast cancer should talk to their health care provider about genetic screening. Women at a high risk of breast cancer should talk to their health care providers about having an MRI and a mammogram every year.  Breast cancer gene (BRCA)-related cancer risk assessment is recommended for women who have family members with BRCA-related cancers. BRCA-related cancers include breast, ovarian, tubal, and peritoneal cancers. Having family members with  these cancers may be associated with an increased risk for harmful changes (mutations) in the breast cancer genes BRCA1 and BRCA2. Results of the assessment will determine the need for genetic counseling and BRCA1 and BRCA2 testing.  Routine pelvic exams to screen for cancer are no longer recommended for nonpregnant women who are considered low risk for cancer of the pelvic organs (ovaries, uterus, and vagina) and who do not have symptoms. Ask your health care provider if a screening pelvic exam is right for you.  If you have had past treatment for cervical cancer or a condition that could lead to cancer, you need Pap tests and screening for cancer for at least 20 years after your treatment. If Pap tests have been discontinued, your risk factors (such as having a new sexual partner) need to be reassessed to determine if screening should be resumed. Some women have medical problems that increase the chance of getting cervical cancer. In these cases, your health care provider may recommend more frequent screening and Pap tests.  The HPV test is an additional test that may be used for cervical cancer screening. The HPV test looks for the virus that can cause the cell changes on the cervix. The cells collected during the Pap test can be tested for HPV. The HPV test could be used to screen women aged 43 years and older, and should be used in women of any age who have unclear Pap test results. After the age of 50, women should have HPV testing at the same frequency as a Pap test.  Colorectal cancer can be detected and often prevented. Most routine colorectal cancer screening begins at the age of 30 years and continues through age 66 years. However, your health care provider may recommend screening at an earlier age if you have risk factors for colon cancer. On a yearly basis, your health care provider may provide home test kits to check for hidden blood in the stool. Use of a small camera at the end of a tube, to  directly examine the colon (sigmoidoscopy or colonoscopy), can detect the earliest forms of colorectal cancer. Talk to your health care provider about this at age 43, when routine screening begins. Direct exam of the colon should be repeated every 5-10 years through age 56 years, unless early forms of pre-cancerous polyps or small growths are found.  People who are at an increased risk for hepatitis B should be screened for this virus. You are considered at high risk for hepatitis B if:  You were born in a country where hepatitis B occurs  often. Talk with your health care provider about which countries are considered high risk.  Your parents were born in a high-risk country and you have not received a shot to protect against hepatitis B (hepatitis B vaccine).  You have HIV or AIDS.  You use needles to inject street drugs.  You live with, or have sex with, someone who has hepatitis B.  You get hemodialysis treatment.  You take certain medicines for conditions like cancer, organ transplantation, and autoimmune conditions.  Hepatitis C blood testing is recommended for all people born from [birth year] through 1965 and any individual with known risks for hepatitis C.  Practice safe sex. Use condoms and avoid high-risk sexual practices to reduce the spread of sexually transmitted infections (STIs). STIs include gonorrhea, chlamydia, syphilis, trichomonas, herpes, HPV, and human immunodeficiency virus (HIV). Herpes, HIV, and HPV are viral illnesses that have no cure. They can result in disability, cancer, and death.  You should be screened for sexually transmitted illnesses (STIs) including gonorrhea and chlamydia if:  You are sexually active and are younger than 24 years.  You are older than 24 years and your health care provider tells you that you are at risk for this type of infection.  Your sexual activity has changed since you were last screened and you are at an increased risk for chlamydia or  gonorrhea. Ask your health care provider if you are at risk.  If you are at risk of being infected with HIV, it is recommended that you take a prescription medicine daily to prevent HIV infection. This is called preexposure prophylaxis (PrEP). You are considered at risk if:  You are a heterosexual woman, are sexually active, and are at increased risk for HIV infection.  You take drugs by injection.  You are sexually active with a partner who has HIV.  Talk with your health care provider about whether you are at high risk of being infected with HIV. If you choose to begin PrEP, you should first be tested for HIV. You should then be tested every 3 months for as long as you are taking PrEP.  Osteoporosis is a disease in which the bones lose minerals and strength with aging. This can result in serious bone fractures or breaks. The risk of osteoporosis can be identified using a bone density scan. Women ages 35 years and over and women at risk for fractures or osteoporosis should discuss screening with their health care providers. Ask your health care provider whether you should take a calcium supplement or vitamin D to reduce the rate of osteoporosis.  Menopause can be associated with physical symptoms and risks. Hormone replacement therapy is available to decrease symptoms and risks. You should talk to your health care provider about whether hormone replacement therapy is right for you.  Use sunscreen. Apply sunscreen liberally and repeatedly throughout the day. You should seek shade when your shadow is shorter than you. Protect yourself by wearing long sleeves, pants, a wide-brimmed hat, and sunglasses year round, whenever you are outdoors.  Once a month, do a whole body skin exam, using a mirror to look at the skin on your back. Tell your health care provider of new moles, moles that have irregular borders, moles that are larger than a pencil eraser, or moles that have changed in shape or  color.  Stay current with required vaccines (immunizations).  Influenza vaccine. All adults should be immunized every year.  Tetanus, diphtheria, and acellular pertussis (Td, Tdap) vaccine. Pregnant women should receive  1 dose of Tdap vaccine during each pregnancy. The dose should be obtained regardless of the length of time since the last dose. Immunization is preferred during the 27th-36th week of gestation. An adult who has not previously received Tdap or who does not know her vaccine status should receive 1 dose of Tdap. This initial dose should be followed by tetanus and diphtheria toxoids (Td) booster doses every 10 years. Adults with an unknown or incomplete history of completing a 3-dose immunization series with Td-containing vaccines should begin or complete a primary immunization series including a Tdap dose. Adults should receive a Td booster every 10 years.  Varicella vaccine. An adult without evidence of immunity to varicella should receive 2 doses or a second dose if she has previously received 1 dose. Pregnant females who do not have evidence of immunity should receive the first dose after pregnancy. This first dose should be obtained before leaving the health care facility. The second dose should be obtained 4-8 weeks after the first dose.  Human papillomavirus (HPV) vaccine. Females aged 13-26 years who have not received the vaccine previously should obtain the 3-dose series. The vaccine is not recommended for use in pregnant females. However, pregnancy testing is not needed before receiving a dose. If a female is found to be pregnant after receiving a dose, no treatment is needed. In that case, the remaining doses should be delayed until after the pregnancy. Immunization is recommended for any person with an immunocompromised condition through the age of 32 years if she did not get any or all doses earlier. During the 3-dose series, the second dose should be obtained 4-8 weeks after the  first dose. The third dose should be obtained 24 weeks after the first dose and 16 weeks after the second dose.  Zoster vaccine. One dose is recommended for adults aged 26 years or older unless certain conditions are present.  Measles, mumps, and rubella (MMR) vaccine. Adults born before [birth year] generally are considered immune to measles and mumps. Adults born in [birth year] or later should have 1 or more doses of MMR vaccine unless there is a contraindication to the vaccine or there is laboratory evidence of immunity to each of the three diseases. A routine second dose of MMR vaccine should be obtained at least 28 days after the first dose for students attending postsecondary schools, health care workers, or international travelers. People who received inactivated measles vaccine or an unknown type of measles vaccine during 1963-1967 should receive 2 doses of MMR vaccine. People who received inactivated mumps vaccine or an unknown type of mumps vaccine before 1979 and are at high risk for mumps infection should consider immunization with 2 doses of MMR vaccine. For females of childbearing age, rubella immunity should be determined. If there is no evidence of immunity, females who are not pregnant should be vaccinated. If there is no evidence of immunity, females who are pregnant should delay immunization until after pregnancy. Unvaccinated health care workers born before [birth year] who lack laboratory evidence of measles, mumps, or rubella immunity or laboratory confirmation of disease should consider measles and mumps immunization with 2 doses of MMR vaccine or rubella immunization with 1 dose of MMR vaccine.  Pneumococcal 13-valent conjugate (PCV13) vaccine. When indicated, a person who is uncertain of her immunization history and has no record of immunization should receive the PCV13 vaccine. An adult aged 47 years or older who has certain medical conditions and has not been previously immunized should receive 1 dose of  PCV13 vaccine. This PCV13 should be followed with a dose of pneumococcal polysaccharide (PPSV23) vaccine. The PPSV23 vaccine dose should be obtained at least 8 weeks after the dose of PCV13 vaccine. An adult aged 66 years or older who has certain medical conditions and previously received 1 or more doses of PPSV23 vaccine should receive 1 dose of PCV13. The PCV13 vaccine dose should be obtained 1 or more years after the last PPSV23 vaccine dose.  Pneumococcal polysaccharide (PPSV23) vaccine. When PCV13 is also indicated, PCV13 should be obtained first. All adults aged 41 years and older should be immunized. An adult younger than age 20 years who has certain medical conditions should be immunized. Any person who resides in a nursing home or long-term care facility should be immunized. An adult smoker should be immunized. People with an immunocompromised condition and certain other conditions should receive both PCV13 and PPSV23 vaccines. People with human immunodeficiency virus (HIV) infection should be immunized as soon as possible after diagnosis. Immunization during chemotherapy or radiation therapy should be avoided. Routine use of PPSV23 vaccine is not recommended for American Indians, [place] Natives, or people younger than 65 years unless there are medical conditions that require PPSV23 vaccine. When indicated, people who have unknown immunization and have no record of immunization should receive PPSV23 vaccine. One-time revaccination 5 years after the first dose of PPSV23 is recommended for people aged 19-64 years who have chronic kidney failure, nephrotic syndrome, asplenia, or immunocompromised conditions. People who received 1-2 doses of PPSV23 before age 33 years should receive another dose of PPSV23 vaccine at age 19 years or later if at least 5 years have passed since the previous dose. Doses of PPSV23 are not needed for people immunized with PPSV23 at or after age 15 years.  Meningococcal vaccine.  Adults with asplenia or persistent complement component deficiencies should receive 2 doses of quadrivalent meningococcal conjugate (MenACWY-D) vaccine. The doses should be obtained at least 2 months apart. Microbiologists working with certain meningococcal bacteria, [place] recruits, people at risk during an outbreak, and people who travel to or live in countries with a high rate of meningitis should be immunized. A first-year college student up through age 52 years who is living in a residence hall should receive a dose if she did not receive a dose on or after her 16th birthday. Adults who have certain high-risk conditions should receive one or more doses of vaccine.  Hepatitis A vaccine. Adults who wish to be protected from this disease, have certain high-risk conditions, work with hepatitis A-infected animals, work in hepatitis A research labs, or travel to or work in countries with a high rate of hepatitis A should be immunized. Adults who were previously unvaccinated and who anticipate close contact with an international adoptee during the first 60 days after arrival in the [place] from a country with a high rate of hepatitis A should be immunized.  Hepatitis B vaccine. Adults who wish to be protected from this disease, have certain high-risk conditions, may be exposed to blood or other infectious body fluids, are household contacts or sex partners of hepatitis B positive people, are clients or workers in certain care facilities, or travel to or work in countries with a high rate of hepatitis B should be immunized.  Haemophilus influenzae type b (Hib) vaccine. A previously unvaccinated person with asplenia or sickle cell disease or having a scheduled splenectomy should receive 1 dose of Hib vaccine. Regardless of previous immunization, a recipient of a hematopoietic stem  cell transplant should receive a 3-dose series 6-12 months after her successful transplant. Hib vaccine is not recommended for  adults with HIV infection. Preventive Services / Frequency  Ages 65 years and over  Blood pressure check.** / Every 1 to 2 years.  Lipid and cholesterol check.** / Every 5 years beginning at age 20 years.  Lung cancer screening. / Every year if you are aged 55-80 years and have a 30-pack-year history of smoking and currently smoke or have quit within the past 15 years. Yearly screening is stopped once you have quit smoking for at least 15 years or develop a health problem that would prevent you from having lung cancer treatment.  Clinical breast exam.** / Every year after age 40 years.  BRCA-related cancer risk assessment.** / For women who have family members with a BRCA-related cancer (breast, ovarian, tubal, or peritoneal cancers).  Mammogram.** / Every year beginning at age 40 years and continuing for as long as you are in good health. Consult with your health care provider.  Pap test.** / Every 3 years starting at age 30 years through age 65 or 70 years with 3 consecutive normal Pap tests. Testing can be stopped between 65 and 70 years with 3 consecutive normal Pap tests and no abnormal Pap or HPV tests in the past 10 years.  HPV screening.** / Every 3 years from ages 30 years through ages 65 or 70 years with a history of 3 consecutive normal Pap tests. Testing can be stopped between 65 and 70 years with 3 consecutive normal Pap tests and no abnormal Pap or HPV tests in the past 10 years.  Fecal occult blood test (FOBT) of stool. / Every year beginning at age 50 years and continuing until age 75 years. You may not need to do this test if you get a colonoscopy every 10 years.  Flexible sigmoidoscopy or colonoscopy.** / Every 5 years for a flexible sigmoidoscopy or every 10 years for a colonoscopy beginning at age 50 years and continuing until age 75 years.  Hepatitis C blood test.** / For all people born from 1945 through 1965 and any individual with known risks for hepatitis  C.  Osteoporosis screening.** / A one-time screening for women ages 65 years and over and women at risk for fractures or osteoporosis.  Skin self-exam. / Monthly.  Influenza vaccine. / Every year.  Tetanus, diphtheria, and acellular pertussis (Tdap/Td) vaccine.** / 1 dose of Td every 10 years.  Varicella vaccine.** / Consult your health care provider.  Zoster vaccine.** / 1 dose for adults aged 60 years or older.  Pneumococcal 13-valent conjugate (PCV13) vaccine.** / Consult your health care provider.  Pneumococcal polysaccharide (PPSV23) vaccine.** / 1 dose for all adults aged 65 years and older.  Meningococcal vaccine.** / Consult your health care provider.  Hepatitis A vaccine.** / Consult your health care provider.  Hepatitis B vaccine.** / Consult your health care provider.  Haemophilus influenzae type b (Hib) vaccine.** / Consult your health care provider.  

## 2014-01-02 LAB — VITAMIN D 25 HYDROXY (VIT D DEFICIENCY, FRACTURES): Vit D, 25-Hydroxy: 92 ng/mL — ABNORMAL HIGH (ref 30–89)

## 2014-01-02 LAB — MICROALBUMIN / CREATININE URINE RATIO
Creatinine, Urine: 81.8 mg/dL
Microalb Creat Ratio: 9.4 mg/g (ref 0.0–30.0)
Microalb, Ur: 0.77 mg/dL (ref 0.00–1.89)

## 2014-01-02 LAB — HEMOGLOBIN A1C
Hgb A1c MFr Bld: 5.9 % — ABNORMAL HIGH (ref <5.7)
MEAN PLASMA GLUCOSE: 123 mg/dL — AB (ref <117)

## 2014-01-02 LAB — BASIC METABOLIC PANEL WITH GFR
BUN: 11 mg/dL (ref 6–23)
CO2: 27 mEq/L (ref 19–32)
Calcium: 9.9 mg/dL (ref 8.4–10.5)
Chloride: 98 mEq/L (ref 96–112)
Creat: 0.87 mg/dL (ref 0.50–1.10)
GFR, EST AFRICAN AMERICAN: 80 mL/min
GFR, EST NON AFRICAN AMERICAN: 69 mL/min
Glucose, Bld: 91 mg/dL (ref 70–99)
POTASSIUM: 4.1 meq/L (ref 3.5–5.3)
Sodium: 133 mEq/L — ABNORMAL LOW (ref 135–145)

## 2014-01-02 LAB — URINALYSIS, MICROSCOPIC ONLY
Bacteria, UA: NONE SEEN
CASTS: NONE SEEN
CRYSTALS: NONE SEEN
SQUAMOUS EPITHELIAL / LPF: NONE SEEN

## 2014-01-02 LAB — LIPID PANEL
CHOLESTEROL: 176 mg/dL (ref 0–200)
HDL: 78 mg/dL (ref >39)
LDL CALC: 76 mg/dL (ref 0–99)
TRIGLYCERIDES: 108 mg/dL (ref <150)
Total CHOL/HDL Ratio: 2.3 Ratio
VLDL: 22 mg/dL (ref 0–40)

## 2014-01-02 LAB — HEPATIC FUNCTION PANEL
ALK PHOS: 76 U/L (ref 39–117)
ALT: 13 U/L (ref 0–35)
AST: 24 U/L (ref 0–37)
Albumin: 4.6 g/dL (ref 3.5–5.2)
BILIRUBIN INDIRECT: 0.5 mg/dL (ref 0.2–1.2)
BILIRUBIN TOTAL: 0.6 mg/dL (ref 0.2–1.2)
Bilirubin, Direct: 0.1 mg/dL (ref 0.0–0.3)
Total Protein: 7.1 g/dL (ref 6.0–8.3)

## 2014-01-02 LAB — MAGNESIUM: Magnesium: 2.2 mg/dL (ref 1.5–2.5)

## 2014-01-02 LAB — TSH: TSH: 1.569 u[IU]/mL (ref 0.350–4.500)

## 2014-01-02 LAB — INSULIN, FASTING: INSULIN FASTING, SERUM: 2.8 u[IU]/mL (ref 2.0–19.6)

## 2014-01-06 ENCOUNTER — Other Ambulatory Visit: Payer: Self-pay | Admitting: Internal Medicine

## 2014-01-19 ENCOUNTER — Other Ambulatory Visit (INDEPENDENT_AMBULATORY_CARE_PROVIDER_SITE_OTHER): Payer: Medicare Other | Admitting: *Deleted

## 2014-01-19 DIAGNOSIS — Z1212 Encounter for screening for malignant neoplasm of rectum: Secondary | ICD-10-CM

## 2014-01-19 LAB — POC HEMOCCULT BLD/STL (HOME/3-CARD/SCREEN)
FECAL OCCULT BLD: NEGATIVE
FECAL OCCULT BLD: NEGATIVE
Fecal Occult Blood, POC: NEGATIVE

## 2014-02-25 ENCOUNTER — Other Ambulatory Visit: Payer: Self-pay | Admitting: Internal Medicine

## 2014-03-05 ENCOUNTER — Other Ambulatory Visit: Payer: Self-pay

## 2014-03-05 MED ORDER — ROSUVASTATIN CALCIUM 20 MG PO TABS: 20.0000 mg | ORAL_TABLET | Freq: Every day | ORAL | Status: DC

## 2014-04-01 ENCOUNTER — Other Ambulatory Visit: Payer: Self-pay | Admitting: Internal Medicine

## 2014-04-01 DIAGNOSIS — F4322 Adjustment disorder with anxiety: Secondary | ICD-10-CM

## 2014-04-01 MED ORDER — ALPRAZOLAM 1 MG PO TABS: ORAL_TABLET | ORAL | Status: DC

## 2014-04-07 ENCOUNTER — Ambulatory Visit: Payer: Self-pay | Admitting: Physician Assistant

## 2014-04-14 ENCOUNTER — Ambulatory Visit (INDEPENDENT_AMBULATORY_CARE_PROVIDER_SITE_OTHER): Payer: Medicare Other | Admitting: Internal Medicine

## 2014-04-14 ENCOUNTER — Other Ambulatory Visit: Payer: Self-pay | Admitting: Internal Medicine

## 2014-04-14 ENCOUNTER — Encounter: Payer: Self-pay | Admitting: Internal Medicine

## 2014-04-14 VITALS — BP 136/82 | HR 84 | Temp 98.1°F | Resp 18 | Ht 66.5 in | Wt 170.0 lb

## 2014-04-14 DIAGNOSIS — R7309 Other abnormal glucose: Secondary | ICD-10-CM

## 2014-04-14 DIAGNOSIS — E559 Vitamin D deficiency, unspecified: Secondary | ICD-10-CM

## 2014-04-14 DIAGNOSIS — R7303 Prediabetes: Secondary | ICD-10-CM

## 2014-04-14 DIAGNOSIS — E785 Hyperlipidemia, unspecified: Secondary | ICD-10-CM

## 2014-04-14 DIAGNOSIS — Z79899 Other long term (current) drug therapy: Secondary | ICD-10-CM

## 2014-04-14 DIAGNOSIS — I10 Essential (primary) hypertension: Secondary | ICD-10-CM

## 2014-04-14 LAB — CBC WITH DIFFERENTIAL/PLATELET
BASOS ABS: 0.1 10*3/uL (ref 0.0–0.1)
Basophils Relative: 1 % (ref 0–1)
Eosinophils Absolute: 0.3 10*3/uL (ref 0.0–0.7)
Eosinophils Relative: 5 % (ref 0–5)
HEMATOCRIT: 36.9 % (ref 36.0–46.0)
HEMOGLOBIN: 12.5 g/dL (ref 12.0–15.0)
LYMPHS PCT: 31 % (ref 12–46)
Lymphs Abs: 1.7 10*3/uL (ref 0.7–4.0)
MCH: 30.5 pg (ref 26.0–34.0)
MCHC: 33.9 g/dL (ref 30.0–36.0)
MCV: 90 fL (ref 78.0–100.0)
MPV: 9.3 fL (ref 8.6–12.4)
Monocytes Absolute: 0.6 10*3/uL (ref 0.1–1.0)
Monocytes Relative: 11 % (ref 3–12)
NEUTROS ABS: 2.8 10*3/uL (ref 1.7–7.7)
NEUTROS PCT: 52 % (ref 43–77)
Platelets: 314 10*3/uL (ref 150–400)
RBC: 4.1 MIL/uL (ref 3.87–5.11)
RDW: 13.6 % (ref 11.5–15.5)
WBC: 5.4 10*3/uL (ref 4.0–10.5)

## 2014-04-14 NOTE — Progress Notes (Signed)
Patient ID: Deborah [name], female   DOB: 1946/01/02, 68 y.o.   MRN: [id number]   This very nice 68 y.o.MWF  presents for 3 month follow up with Hypertension, Hyperlipidemia, Pre-Diabetes and Vitamin D Deficiency.    Patient recently has a (+0 bx of a skin cancer of her left lower eye lid and is scheduling for surgery.   Patient is monitored expectantly  For labile HTN & BP has been controlled at home. Today's BP: 136/82 mmHg. Patient has had no complaints of any cardiac type chest pain, palpitations, dyspnea/orthopnea/PND, dizziness, claudication, or dependent edema.   Hyperlipidemia is controlled with diet & meds. Patient denies myalgias or other med SE's. Last Lipids were at goal - Total Chol 176; HDL  78; LDL  76; Trig 108 on 01/01/2014.   Also, the patient has history of  PreDiabetes and has had no symptoms of reactive hypoglycemia, diabetic polys, paresthesias or visual blurring.  Last A1c was  5.9% on  01/01/2014.   Further, the patient also has history of Vitamin D Deficiency and supplements vitamin D without any suspected side-effects. Last vitamin D was  92 on  01/01/2014.   Medication List   aspirin 81 MG tablet  Take 81 mg by mouth daily.     BIOTIN PO  Take by mouth. Patient takes 10000 mcg plus Keratin 1 daily     meloxicam 15 MG tablet  Commonly known as:  MOBIC  TAKE ONE TABLET BY MOUTH ONCE DAILY AS NEEDED FOR PAIN AND  INFLAMMATION     multivitamin tablet  Take 1 tablet by mouth daily.     OVER THE COUNTER MEDICATION  Magnesium with chelate Zinc 1 deaily     OVER THE COUNTER MEDICATION  Advanced Melatonin 1 daily     rosuvastatin 20 MG tablet  Commonly known as:  CRESTOR  Take 1 tablet (20 mg total) by mouth daily.     SUMAtriptan 20 MG/ACT nasal spray  Commonly known as:  IMITREX  USE ONE DOSE STAT AND MAY REPEAT IN 2 HOURS MAX 2 DOSES IN 24 HOURS     VITAMIN D PO  Take 5,000 Units by mouth daily.     VITAMIN-B COMPLEX PO  Take 1 capsule by mouth daily.      Allergies  Allergen Reactions  . Banana     Nausea and vomiting  . Effexor [Venlafaxine]     insomnia  . Tussin [Guaifenesin]     rash  . Zostavax [Zoster Vaccine Live]    PMHx:   Past Medical History  Diagnosis Date  . Hypertension   . Hyperlipidemia   . Arthritis   . Allergy   . Migraines   . Vitamin D deficiency   . Prediabetes   . RLS (restless legs syndrome)   . Anxiety   . Depression   . Anemia    Immunization History  Administered Date(s) Administered  . Influenza, High Dose Seasonal PF 01/01/2014  . Influenza-Unspecified 12/26/2012  . Pneumococcal-Unspecified 12/26/2012  . Td 12/26/2012  . Zoster 12/08/2010   Past Surgical History  Procedure Laterality Date  . Spine surgery  2010    C6-C7 fusion   FHx:    Reviewed / unchanged  SHx:    Reviewed / unchanged  Systems Review:  Constitutional: Denies fever, chills, wt changes, headaches, insomnia, fatigue, night sweats, change in appetite. Eyes: Denies redness, blurred vision, diplopia, discharge, itchy, watery eyes.  ENT: Denies discharge, congestion, post nasal drip, epistaxis, sore throat, earache,  hearing loss, dental pain, tinnitus, vertigo, sinus pain, snoring.  CV: Denies chest pain, palpitations, irregular heartbeat, syncope, dyspnea, diaphoresis, orthopnea, PND, claudication or edema. Respiratory: denies cough, dyspnea, DOE, pleurisy, hoarseness, laryngitis, wheezing.  Gastrointestinal: Denies dysphagia, odynophagia, heartburn, reflux, water brash, abdominal pain or cramps, nausea, vomiting, bloating, diarrhea, constipation, hematemesis, melena, hematochezia  or hemorrhoids. Genitourinary: Denies dysuria, frequency, urgency, nocturia, hesitancy, discharge, hematuria or flank pain. Musculoskeletal: Denies arthralgias, myalgias, stiffness, jt. swelling, pain, limping or strain/sprain.  Skin: Denies pruritus, rash, hives, warts, acne, eczema or change in skin lesion(s). Neuro: No weakness, tremor,  incoordination, spasms, paresthesia or pain. Psychiatric: Denies confusion, memory loss or sensory loss. Endo: Denies change in weight, skin or hair change.  Heme/Lymph: No excessive bleeding, bruising or enlarged lymph nodes.  Physical Exam  BP 136/82   Pulse 84  Temp 98.1 F   Resp 18  Ht 5' 6.5"  Wt 170 lb   BMI 27.03   Appears well nourished and in no distress. Eyes: PERRLA, EOMs, conjunctiva no swelling or erythema. Sinuses: No frontal/maxillary tenderness ENT/Mouth: EAC's clear, TM's nl w/o erythema, bulging. Nares clear w/o erythema, swelling, exudates. Oropharynx clear without erythema or exudates. Oral hygiene is good. Tongue normal, non obstructing. Hearing intact.  Neck: Supple. Thyroid nl. Car 2+/2+ without bruits, nodes or JVD. Chest: Respirations nl with BS clear & equal w/o rales, rhonchi, wheezing or stridor.  Cor: Heart sounds normal w/ regular rate and rhythm without sig. murmurs, gallops, clicks, or rubs. Peripheral pulses normal and equal  without edema.  Abdomen: Soft & bowel sounds normal. Non-tender w/o guarding, rebound, hernias, masses, or organomegaly.  Lymphatics: Unremarkable.  Musculoskeletal: Full ROM all peripheral extremities, joint stability, 5/5 strength, and normal gait.  Skin: Warm, dry without exposed rashes, lesions or ecchymosis apparent.  Neuro: Cranial nerves intact, reflexes equal bilaterally. Sensory-motor testing grossly intact. Tendon reflexes grossly intact.  Pysch: Alert & oriented x 3.  Insight and judgement nl & appropriate. No ideations.  Assessment and Plan:  1. Hypertension - Continue monitor blood pressure at home. Continue diet/meds same.  2. Hyperlipidemia - Continue diet/meds, exercise,& lifestyle modifications. Continue monitor periodic cholesterol/liver & renal functions   3.  Pre-Diabetes - Continue diet, exercise, lifestyle modifications. Monitor appropriate labs.  4. Vitamin D Deficiency - Continue  supplementation.   Recommended regular exercise, BP monitoring, weight control, and discussed med and SE's. Recommended labs to assess and monitor clinical status. Further disposition pending results of labs.

## 2014-04-14 NOTE — Patient Instructions (Signed)

## 2014-04-15 LAB — HEPATIC FUNCTION PANEL
ALT: 33 U/L (ref 0–35)
AST: 41 U/L — ABNORMAL HIGH (ref 0–37)
Albumin: 4.3 g/dL (ref 3.5–5.2)
Alkaline Phosphatase: 65 U/L (ref 39–117)
BILIRUBIN DIRECT: 0.1 mg/dL (ref 0.0–0.3)
BILIRUBIN TOTAL: 0.5 mg/dL (ref 0.2–1.2)
Indirect Bilirubin: 0.4 mg/dL (ref 0.2–1.2)
Total Protein: 6.8 g/dL (ref 6.0–8.3)

## 2014-04-15 LAB — BASIC METABOLIC PANEL WITH GFR
BUN: 18 mg/dL (ref 6–23)
CHLORIDE: 101 meq/L (ref 96–112)
CO2: 27 meq/L (ref 19–32)
Calcium: 9.8 mg/dL (ref 8.4–10.5)
Creat: 0.87 mg/dL (ref 0.50–1.10)
GFR, EST NON AFRICAN AMERICAN: 69 mL/min
GFR, Est African American: 79 mL/min
Glucose, Bld: 72 mg/dL (ref 70–99)
Potassium: 4.2 mEq/L (ref 3.5–5.3)
Sodium: 136 mEq/L (ref 135–145)

## 2014-04-15 LAB — LIPID PANEL
CHOL/HDL RATIO: 3.1 ratio
CHOLESTEROL: 236 mg/dL — AB (ref 0–200)
HDL: 75 mg/dL (ref >39)
LDL Cholesterol: 134 mg/dL — ABNORMAL HIGH (ref 0–99)
TRIGLYCERIDES: 134 mg/dL (ref <150)
VLDL: 27 mg/dL (ref 0–40)

## 2014-04-15 LAB — TSH: TSH: 1.933 u[IU]/mL (ref 0.350–4.500)

## 2014-04-15 LAB — VITAMIN D 25 HYDROXY (VIT D DEFICIENCY, FRACTURES): Vit D, 25-Hydroxy: 57 ng/mL (ref 30–100)

## 2014-04-15 LAB — INSULIN, FASTING: Insulin fasting, serum: 5.6 u[IU]/mL (ref 2.0–19.6)

## 2014-04-15 LAB — HEMOGLOBIN A1C
HEMOGLOBIN A1C: 6 % — AB (ref <5.7)
Mean Plasma Glucose: 126 mg/dL — ABNORMAL HIGH (ref <117)

## 2014-04-15 LAB — MAGNESIUM: Magnesium: 2.1 mg/dL (ref 1.5–2.5)

## 2014-04-17 HISTORY — PX: KNEE ARTHROSCOPY: SUR90

## 2014-04-30 ENCOUNTER — Telehealth: Payer: Self-pay

## 2014-04-30 NOTE — Telephone Encounter (Signed)
Sunday Spillers from [facility] called requesting last EKG. EKG was faxed to [facility] [phone].

## 2014-07-15 ENCOUNTER — Ambulatory Visit: Payer: Self-pay | Admitting: Internal Medicine

## 2014-07-21 ENCOUNTER — Other Ambulatory Visit: Payer: Self-pay | Admitting: Internal Medicine

## 2014-07-21 DIAGNOSIS — E782 Mixed hyperlipidemia: Secondary | ICD-10-CM

## 2014-07-21 MED ORDER — ROSUVASTATIN CALCIUM 20 MG PO TABS: 20.0000 mg | ORAL_TABLET | Freq: Every day | ORAL | Status: DC

## 2014-07-23 ENCOUNTER — Ambulatory Visit: Payer: Self-pay | Admitting: Physician Assistant

## 2014-08-06 ENCOUNTER — Ambulatory Visit (INDEPENDENT_AMBULATORY_CARE_PROVIDER_SITE_OTHER): Payer: Medicare Other | Admitting: Physician Assistant

## 2014-08-06 ENCOUNTER — Encounter: Payer: Self-pay | Admitting: Physician Assistant

## 2014-08-06 VITALS — BP 128/78 | HR 80 | Temp 98.1°F | Resp 16 | Ht 66.5 in | Wt 171.0 lb

## 2014-08-06 DIAGNOSIS — E785 Hyperlipidemia, unspecified: Secondary | ICD-10-CM

## 2014-08-06 DIAGNOSIS — C4490 Unspecified malignant neoplasm of skin, unspecified: Secondary | ICD-10-CM

## 2014-08-06 DIAGNOSIS — Z0000 Encounter for general adult medical examination without abnormal findings: Secondary | ICD-10-CM

## 2014-08-06 DIAGNOSIS — G43909 Migraine, unspecified, not intractable, without status migrainosus: Secondary | ICD-10-CM | POA: Insufficient documentation

## 2014-08-06 DIAGNOSIS — I10 Essential (primary) hypertension: Secondary | ICD-10-CM

## 2014-08-06 DIAGNOSIS — R7303 Prediabetes: Secondary | ICD-10-CM

## 2014-08-06 DIAGNOSIS — E559 Vitamin D deficiency, unspecified: Secondary | ICD-10-CM

## 2014-08-06 DIAGNOSIS — R32 Unspecified urinary incontinence: Secondary | ICD-10-CM | POA: Insufficient documentation

## 2014-08-06 DIAGNOSIS — Z79899 Other long term (current) drug therapy: Secondary | ICD-10-CM

## 2014-08-06 DIAGNOSIS — R7309 Other abnormal glucose: Secondary | ICD-10-CM

## 2014-08-06 DIAGNOSIS — Z85828 Personal history of other malignant neoplasm of skin: Secondary | ICD-10-CM | POA: Insufficient documentation

## 2014-08-06 DIAGNOSIS — Z9181 History of falling: Secondary | ICD-10-CM

## 2014-08-06 DIAGNOSIS — Z1331 Encounter for screening for depression: Secondary | ICD-10-CM

## 2014-08-06 DIAGNOSIS — G43809 Other migraine, not intractable, without status migrainosus: Secondary | ICD-10-CM

## 2014-08-06 DIAGNOSIS — F411 Generalized anxiety disorder: Secondary | ICD-10-CM | POA: Insufficient documentation

## 2014-08-06 DIAGNOSIS — Z23 Encounter for immunization: Secondary | ICD-10-CM

## 2014-08-06 HISTORY — DX: Unspecified urinary incontinence: R32

## 2014-08-06 LAB — CBC WITH DIFFERENTIAL/PLATELET
BASOS ABS: 0.1 10*3/uL (ref 0.0–0.1)
Basophils Relative: 1 % (ref 0–1)
EOS PCT: 4 % (ref 0–5)
Eosinophils Absolute: 0.2 10*3/uL (ref 0.0–0.7)
HCT: 37.2 % (ref 36.0–46.0)
Hemoglobin: 12.6 g/dL (ref 12.0–15.0)
LYMPHS ABS: 1.8 10*3/uL (ref 0.7–4.0)
Lymphocytes Relative: 33 % (ref 12–46)
MCH: 29.4 pg (ref 26.0–34.0)
MCHC: 33.9 g/dL (ref 30.0–36.0)
MCV: 86.7 fL (ref 78.0–100.0)
MONO ABS: 0.6 10*3/uL (ref 0.1–1.0)
MONOS PCT: 11 % (ref 3–12)
MPV: 9.5 fL (ref 8.6–12.4)
NEUTROS ABS: 2.8 10*3/uL (ref 1.7–7.7)
Neutrophils Relative %: 51 % (ref 43–77)
PLATELETS: 302 10*3/uL (ref 150–400)
RBC: 4.29 MIL/uL (ref 3.87–5.11)
RDW: 13.1 % (ref 11.5–15.5)
WBC: 5.5 10*3/uL (ref 4.0–10.5)

## 2014-08-06 LAB — HEMOGLOBIN A1C
Hgb A1c MFr Bld: 6 % — ABNORMAL HIGH
Mean Plasma Glucose: 126 mg/dL — ABNORMAL HIGH

## 2014-08-06 NOTE — Progress Notes (Signed)
MEDICARE ANNUAL WELLNESS VISIT AND FOLLOW UP  Assessment:   1. Essential hypertension - continue medications, DASH diet, exercise and monitor at home. Call if greater than 130/80.  - CBC with Differential/Platelet - BASIC METABOLIC PANEL WITH GFR - Hepatic function panel - TSH  2. Prediabetes Discussed general issues about diabetes pathophysiology and management., Educational material distributed., Suggested low cholesterol diet., Encouraged aerobic exercise., Discussed foot care., Reminded to get yearly retinal exam. - Hemoglobin A1c - Insulin, fasting - HM DIABETES FOOT EXAM  3. Hyperlipidemia -continue medications, check lipids, decrease fatty foods, increase activity. - Lipid panel  4. Vitamin D deficiency - Vit D  25 hydroxy (rtn osteoporosis monitoring)  5. Medication management - Magnesium  6. Screening for depression negative  7. Routine general medical examination at a health care facility  8. Skin cancer Continue follow up derm  9. Other migraine without status migrainosus, not intractable controlled  10. Generalized anxiety disorder Anxiety-  continue medications, stress management techniques discussed, increase water, good sleep hygiene discussed, increase exercise, and increase veggies.   11. Need for prophylactic vaccination against Streptococcus pneumoniae (pneumococcus) - Pneumococcal conjugate vaccine 13-valent IM  12. Incontinence in female Samples of myrbetriq given, bladder matters given.   Over 30 minutes of exam, counseling, chart review, and critical decision making was performed  Plan:   During the course of the visit the patient was educated and counseled about appropriate screening and preventive services including:    Pneumococcal vaccine   Influenza vaccine  Td vaccine  Screening electrocardiogram  Screening mammography  Bone densitometry screening  Colorectal cancer screening  Diabetes screening  Glaucoma  screening  Nutrition counseling   Advanced directives: given info/requested  Conditions/risks identified: Diabetes is at goal, ACE/ARB therapy: No, Reason not on Ace Inhibitor/ARB therapy:  only PreDM Urinary Incontinence is an issue: discussed non pharmacology and pharmacology options. Given bladder matters and will give sample myrebtreiq.  Fall risk: low- moderate- discussed PT, home fall assessment, medications.    Subjective:   Deborah [name] is a 69 y.o. female who presents for Medicare Annual Wellness Visit and 3 month follow up on hypertension, prediabetes, hyperlipidemia, vitamin D def.  Date of last medicare wellness visit was 01/01/2014.   Her blood pressure has been controlled at home, today their BP is BP: 128/78 mmHg She does workout. She denies chest pain, shortness of breath, dizziness.  She is on cholesterol medication and denies myalgias. Her cholesterol is not at goal. The cholesterol last visit was:   Lab Results  Component Value Date   CHOL 236* 04/14/2014   HDL 75 04/14/2014   LDLCALC 134* 04/14/2014   TRIG 134 04/14/2014   CHOLHDL 3.1 04/14/2014   She has been working on diet and exercise for prediabetes x 2010, and denies paresthesia of the feet, polydipsia, polyuria and visual disturbances. Last A1C in the office was:  Lab Results  Component Value Date   HGBA1C 6.0* 04/14/2014   Patient is on Vitamin D supplement. Lab Results  Component Value Date   VD25OH 57 04/14/2014    She has a history of migraines, uses imitrex PRN, well controlled.  [facility]  She does have stress/over flow incontinence.  Had bilateral knee pain, had arthroscopy bilateral knees in jan at [facility], she did have some falls after her procedures due to imbalance, did PT.  She is on xanax PRN for anxiety and uses for sleep.  Wt Readings  from Last 3 Encounters:  08/06/14 171 lb (77.565 kg)  04/14/14 170 lb  (77.111 kg)  01/01/14 165 lb (74.844 kg)     Medication Review Current Outpatient Prescriptions on File Prior to Visit  Medication Sig Dispense Refill  . ALPRAZolam (XANAX) 1 MG tablet Take 1/2 to 1 tablet 2 or 3 x day if needed for anxiety 90 tablet 2  . aspirin 81 MG tablet Take 81 mg by mouth daily.    . B Complex Vitamins (VITAMIN-B COMPLEX PO) Take 1 capsule by mouth daily.    Marland Kitchen BIOTIN PO Take by mouth. Patient takes 10000 mcg plus Keratin 1 daily    . Cholecalciferol (VITAMIN D PO) Take 5,000 Units by mouth daily.    . meloxicam (MOBIC) 15 MG tablet TAKE ONE TABLET BY MOUTH ONCE DAILY AS NEEDED FOR PAIN AND  INFLAMMATION 90 tablet 99  . Multiple Vitamin (MULTIVITAMIN) tablet Take 1 tablet by mouth daily.    Marland Kitchen OVER THE COUNTER MEDICATION Magnesium with chelate Zinc 1 deaily    . OVER THE COUNTER MEDICATION Advanced Melatonin 1 daily    . rosuvastatin (CRESTOR) 20 MG tablet Take 1 tablet (20 mg total) by mouth daily. 90 tablet 0  . SUMAtriptan (IMITREX) 20 MG/ACT nasal spray USE ONE DOSE STAT AND MAY REPEAT IN 2 HOURS MAX 2 DOSES IN 24 HOURS 12 Inhaler 99   No current facility-administered medications on file prior to visit.    Current Problems (verified) Patient Active Problem List   Diagnosis Date Noted  . Skin cancer 08/06/2014  . Migraine 08/06/2014  . Generalized anxiety disorder 08/06/2014  . Incontinence in female 08/06/2014  . Prediabetes 07/01/2013  . Medication management 07/01/2013  . Hypertension   . Hyperlipidemia   . Vitamin D deficiency     Screening Tests Immunization History  Administered Date(s) Administered  . Influenza, High Dose Seasonal PF 01/01/2014  . Influenza-Unspecified 12/26/2012  . Pneumococcal Conjugate-13 08/06/2014  . Pneumococcal-Unspecified 12/26/2012  . Td 12/26/2012  . Zoster 12/08/2010   Preventative care: Last colonoscopy: 11/2006 Last mammogram: 12/2013 Last pap smear/pelvic exam: 2013  DEXA:10/2006  Prior  vaccinations: TD : 12/2012 Influenza: 01/01/2014 Pneumococcal: 12/2012 Prevnar 13: DUE  Shingles/Zostavax: 11/2010  Names of Other Physician/Practitioners you currently use: 1. [facility]- here for primary care 2. Dr Shirley Muscat, eye doctor, last visit 2015 3. Dr Randie Heinz, dentist, last visit 2015  Patient Care Team: Unk Pinto, MD as PCP - General (Internal Medicine) Richmond Campbell, MD as Consulting Physician (Gastroenterology) Calton Dach, MD as Referring Physician (Optometry) Berenice Primas, MD as Referring Physician (Orthopedic Surgery) Janann August, MD as Referring Physician (Dermatology)  Past Surgical History  Procedure Laterality Date  . Spine surgery  2010    C6-C7 fusion  . Mohs surgery Right 04/2013    Squamous cell cacinoma  . Knee arthroscopy Bilateral 04/2014    [facility]   Family History  Problem Relation Age of Onset  . Diabetes Mother   . Heart disease Mother   . Heart disease Father   . Stroke Father   . Hypertension Father   . Hyperlipidemia Sister   . Hypertension Brother    History  Substance Use Topics  . Smoking status: Former Smoker -- 1.00 packs/day for 4 years    Types: Cigarettes    Quit date: 03/25/1986  . Smokeless tobacco: Never Used  . Alcohol Use: Yes     Comment: rare    MEDICARE WELLNESS  OBJECTIVES: Tobacco use: She does not smoke.  Patient is a former smoker. Alcohol Current alcohol use: glass of wine with dinner Caffeine Current caffeine use: coffee 1 /day Osteoporosis: postmenopausal estrogen deficiency and dietary calcium and/or vitamin D deficiency, History of fracture in the past year: no Diet: in general, a "healthy" diet   Physical activity: walking Depression/mood screen:   Depression screen [facility] 2/9 08/06/2014  Decreased Interest 0  Down, Depressed, Hopeless 0  PHQ - 2 Score 0   Hearing: decreased Visual acuity: normal,  does perform annual eye exam   ADLs:  In your present state of health, do you have any difficulty performing the following activities: 08/06/2014 01/01/2014  Hearing? N N  Vision? N N  Difficulty concentrating or making decisions? N N  Walking or climbing stairs? N N  Dressing or bathing? N N  Doing errands, shopping? N N  Preparing Food and eating ? N -  Using the Toilet? N -  In the past six months, have you accidently leaked urine? N -  Do you have problems with loss of bowel control? N -  Managing your Medications? N -  Managing your Finances? N -  Housekeeping or managing your Housekeeping? N -    Fall risk: Low- moderate Risk Cognitive Testing  Alert? Yes  Normal Appearance?Yes  Oriented to person? Yes  Place? Yes   Time? Yes  Recall of three objects?  Yes  Can perform simple calculations? Yes  Displays appropriate judgment?Yes  Can read the correct time from a watch face?Yes No flowsheet data found. EOL planning: Does patient have an advance directive?: No Would patient like information on creating an advanced directive?: Yes - Educational materials given     Objective:   Blood pressure 128/78, pulse 80, temperature 98.1 F (36.7 C), resp. rate 16, height 5' 6.5" (1.689 m), weight 171 lb (77.565 kg). Body mass index is 27.19 kg/(m^2).  General appearance: alert, no distress, WD/WN,  female HEENT: normocephalic, sclerae anicteric, TMs pearly, nares patent, no discharge or erythema, pharynx normal Oral cavity: MMM, no lesions Neck: supple, no lymphadenopathy, no thyromegaly, no masses Heart: RRR, normal S1, S2, no murmurs Lungs: CTA bilaterally, no wheezes, rhonchi, or rales Abdomen: +bs, soft, non tender, non distended, no masses, no hepatomegaly, no splenomegaly Musculoskeletal: nontender, no swelling, no obvious deformity Extremities: no edema, no cyanosis, no clubbing Pulses: 2+ symmetric, upper and lower extremities, normal cap refill Neurological: alert, oriented x 3, CN2-12 intact,  strength normal upper extremities and lower extremities, sensation normal throughout, DTRs 2+ throughout, no cerebellar signs, gait normal Psychiatric: normal affect, behavior normal, pleasant  Breast: defer Gyn: defer Rectal: defer   Medicare Attestation I have personally reviewed: The patient's medical and social history Their use of alcohol, tobacco or illicit drugs Their current medications and supplements The patient's functional ability including ADLs,fall risks, home safety risks, cognitive, and hearing and visual impairment Diet and physical activities Evidence for depression or mood disorders  The patient's weight, height, BMI, and visual acuity have been recorded in the chart.  I have made referrals, counseling, and provided education to the patient based on review of the above and I have provided the patient with a written personalized care plan for preventive services.     Vicie Mutters, PA-C   08/06/2014

## 2014-08-06 NOTE — Patient Instructions (Addendum)
: - Try the Flonase or Nasonex. Remember to spray each nostril twice towards the outer part of your eye.  Do not sniff but instead pinch your nose and tilt your head back to help the medicine get into your sinuses.  The best time to do this is at bedtime.Stop if you get blurred vision or nose bleeds.  -While drinking fluids, pinch and hold nose close and swallow, to help open eustachian tubes to drain fluid behind ear drums.  if worsening HA, changes vision/speech, imbalance, weakness go to the ER     Bad carbs also include fruit juice, alcohol, and sweet tea. These are empty calories that do not signal to your brain that you are full.   Please remember the good carbs are still carbs which convert into sugar. So please measure them out no more than 1/2-1 cup of rice, oatmeal, pasta, and beans  Veggies are however free foods! Pile them on.   Not all fruit is created equal. Please see the list below, the fruit at the bottom is higher in sugars than the fruit at the top. Please avoid all dried fruits.      Before you even begin to attack a weight-loss plan, it pays to remember this: You are not fat. You have fat. Losing weight isn't about blame or shame; it's simply another achievement to accomplish. Dieting is like any other skill-you have to buckle down and work at it. As long as you act in a smart, reasonable way, you'll ultimately get where you want to be. Here are some weight loss pearls for you.  1. It's Not a Diet. It's a Lifestyle Thinking of a diet as something you're on and suffering through only for the short term doesn't work. To shed weight and keep it off, you need to make permanent changes to the way you eat. It's OK to indulge occasionally, of course, but if you cut calories temporarily and then revert to your old way of eating, you'll gain back the weight quicker than you can say yo-yo. Use it to lose it. Research shows that one of the best predictors of long-term weight loss is  how many pounds you drop in the first month. For that reason, nutritionists often suggest being stricter for the first two weeks of your new eating strategy to build momentum. Cut out added sugar and alcohol and avoid unrefined carbs. After that, figure out how you can reincorporate them in a way that's healthy and maintainable.  2. There's a Right Way to Exercise Working out burns calories and fat and boosts your metabolism by building muscle. But those trying to lose weight are notorious for overestimating the number of calories they burn and underestimating the amount they take in. Unfortunately, your system is biologically programmed to hold on to extra pounds and that means when you start exercising, your body senses the deficit and ramps up its hunger signals. If you're not diligent, you'll eat everything you burn and then some. Use it to lose it. Cardio gets all the exercise glory, but strength and interval training are the real heroes. They help you build lean muscle, which in turn increases your metabolism and calorie-burning ability 3. Don't Overreact to Mild Hunger Some people have a hard time losing weight because of hunger anxiety. To them, being hungry is bad-something to be avoided at all costs-so they carry snacks with them and eat when they don't need to. Others eat because they're stressed out or bored. While you  never want to get to the point of being ravenous (that's when bingeing is likely to happen), a hunger pang, a craving, or the fact that it's 3:00 p.m. should not send you racing for the vending machine or obsessing about the energy bar in your purse. Ideally, you should put off eating until your stomach is growling and it's difficult to concentrate.  Use it to lose it. When you feel the urge to eat, use the HALT method. Ask yourself, Am I really hungry? Or am I angry or anxious, lonely or bored, or tired? If you're still not certain, try the apple test. If you're truly hungry, an  apple should seem delicious; if it doesn't, something else is going on. Or you can try drinking water and making yourself busy, if you are still hungry try a healthy snack.  4. Not All Calories Are Created Equal The mechanics of weight loss are pretty simple: Take in fewer calories than you use for energy. But the kind of food you eat makes all the difference. Processed food that's high in saturated fat and refined starch or sugar can cause inflammation that disrupts the hormone signals that tell your brain you're full. The result: You eat a lot more.  Use it to lose it. Clean up your diet. Swap in whole, unprocessed foods, including vegetables, lean protein, and healthy fats that will fill you up and give you the biggest nutritional bang for your calorie buck. In a few weeks, as your brain starts receiving regular hunger and fullness signals once again, you'll notice that you feel less hungry overall and naturally start cutting back on the amount you eat.  5. Protein, Produce, and Plant-Based Fats Are Your Weight-Loss Trinity Here's why eating the three Ps regularly will help you drop pounds. Protein fills you up. You need it to build lean muscle, which keeps your metabolism humming so that you can torch more fat. People in a weight-loss program who ate double the recommended daily allowance for protein (about 110 grams for a 150-pound woman) lost 70 percent of their weight from fat, while people who ate the RDA lost only about 40 percent, one study found. Produce is packed with filling fiber. "It's very difficult to consume too many calories if you're eating a lot of vegetables. Example: Three cups of broccoli is a lot of food, yet only 93 calories. (Fruit is another story. It can be easy to overeat and can contain a lot of calories from sugar, so be sure to monitor your intake.) Plant-based fats like olive oil and those in avocados and nuts are healthy and extra satiating.  Use it to lose it. Aim to  incorporate each of the three Ps into every meal and snack. People who eat protein throughout the day are able to keep weight off, according to a study in the [place] of Clinical Nutrition. In addition to meat, poultry and seafood, good sources are beans, lentils, eggs, tofu, and yogurt. As for fat, keep portion sizes in check by measuring out salad dressing, oil, and nut butters (shoot for one to two tablespoons). Finally, eat veggies or a little fruit at every meal. People who did that consumed 308 fewer calories but didn't feel any hungrier than when they didn't eat more produce.  7. How You Eat Is As Important As What You Eat In order for your brain to register that you're full, you need to focus on what you're eating. Sit down whenever you eat, preferably at  a table. Turn off the TV or computer, put down your phone, and look at your food. Smell it. Chew slowly, and don't put another bite on your fork until you swallow. When women ate lunch this attentively, they consumed 30 percent less when snacking later than those who listened to an audiobook at lunchtime, according to a study in the [place] of Nutrition. 8. Weighing Yourself Really Works The scale provides the best evidence about whether your efforts are paying off. Seeing the numbers tick up or down or stagnate is motivation to keep going-or to rethink your approach. A 2015 study at [facility] found that daily weigh-ins helped people lose more weight, keep it off, and maintain that loss, even after two years. Use it to lose it. Step on the scale at the same time every day for the best results. If your weight shoots up several pounds from one weigh-in to the next, don't freak out. Eating a lot of salt the night before or having your period is the likely culprit. The number should return to normal in a day or two. It's a steady climb that you need to do something about. 9. Too Much Stress and Too Little Sleep Are Your  Enemies When you're tired and frazzled, your body cranks up the production of cortisol, the stress hormone that can cause carb cravings. Not getting enough sleep also boosts your levels of ghrelin, a hormone associated with hunger, while suppressing leptin, a hormone that signals fullness and satiety. People on a diet who slept only five and a half hours a night for two weeks lost 55 percent less fat and were hungrier than those who slept eight and a half hours, according to a study in the [place]. Use it to lose it. Prioritize sleep, aiming for seven hours or more a night, which research shows helps lower stress. And make sure you're getting quality zzz's. If a snoring spouse or a fidgety cat wakes you up frequently throughout the night, you may end up getting the equivalent of just four hours of sleep, according to a study from [facility]. Keep pets out of the bedroom, and use a white-noise app to drown out snoring. 10. You Will Hit a plateau-And You Can Bust Through It As you slim down, your body releases much less leptin, the fullness hormone.  If you're not strength training, start right now. Building muscle can raise your metabolism to help you overcome a plateau. To keep your body challenged and burning calories, incorporate new moves and more intense intervals into your workouts or add another sweat session to your weekly routine. Alternatively, cut an extra 100 calories or so a day from your diet. Now that you've lost weight, your body simply doesn't need as much fuel.

## 2014-08-07 LAB — LIPID PANEL
CHOLESTEROL: 192 mg/dL (ref 0–200)
HDL: 86 mg/dL (ref >=46)
LDL CALC: 86 mg/dL (ref 0–99)
Total CHOL/HDL Ratio: 2.2 Ratio
Triglycerides: 98 mg/dL (ref <150)
VLDL: 20 mg/dL (ref 0–40)

## 2014-08-07 LAB — BASIC METABOLIC PANEL WITH GFR
BUN: 10 mg/dL (ref 6–23)
CHLORIDE: 101 meq/L (ref 96–112)
CO2: 27 meq/L (ref 19–32)
Calcium: 9.7 mg/dL (ref 8.4–10.5)
Creat: 0.93 mg/dL (ref 0.50–1.10)
GFR, Est African American: 73 mL/min
GFR, Est Non African American: 63 mL/min
Glucose, Bld: 85 mg/dL (ref 70–99)
POTASSIUM: 4.2 meq/L (ref 3.5–5.3)
SODIUM: 137 meq/L (ref 135–145)

## 2014-08-07 LAB — HEPATIC FUNCTION PANEL
ALBUMIN: 4.3 g/dL (ref 3.5–5.2)
ALT: 12 U/L (ref 0–35)
AST: 23 U/L (ref 0–37)
Alkaline Phosphatase: 65 U/L (ref 39–117)
BILIRUBIN TOTAL: 0.3 mg/dL (ref 0.2–1.2)
Bilirubin, Direct: 0.1 mg/dL (ref 0.0–0.3)
Indirect Bilirubin: 0.2 mg/dL (ref 0.2–1.2)
Total Protein: 6.7 g/dL (ref 6.0–8.3)

## 2014-08-07 LAB — INSULIN, FASTING: Insulin fasting, serum: 5.5 u[IU]/mL (ref 2.0–19.6)

## 2014-08-07 LAB — MAGNESIUM: MAGNESIUM: 2.2 mg/dL (ref 1.5–2.5)

## 2014-08-07 LAB — TSH: TSH: 3.188 u[IU]/mL (ref 0.350–4.500)

## 2014-08-07 LAB — VITAMIN D 25 HYDROXY (VIT D DEFICIENCY, FRACTURES): VIT D 25 HYDROXY: 66 ng/mL (ref 30–100)

## 2014-09-08 ENCOUNTER — Other Ambulatory Visit: Payer: Self-pay | Admitting: Internal Medicine

## 2014-09-08 DIAGNOSIS — G43809 Other migraine, not intractable, without status migrainosus: Secondary | ICD-10-CM

## 2014-09-08 MED ORDER — ZOLMITRIPTAN 5 MG NA SOLN: NASAL | Status: DC

## 2014-09-09 ENCOUNTER — Ambulatory Visit (INDEPENDENT_AMBULATORY_CARE_PROVIDER_SITE_OTHER): Payer: Medicare Other

## 2014-09-09 DIAGNOSIS — R300 Dysuria: Secondary | ICD-10-CM

## 2014-09-09 MED ORDER — CIPROFLOXACIN HCL 500 MG PO TABS
500.0000 mg | ORAL_TABLET | Freq: Two times a day (BID) | ORAL | Status: DC
Start: 2014-09-09 — End: 2014-11-12

## 2014-09-09 NOTE — Progress Notes (Signed)
Patient ID: Deborah [name], female   DOB: June 03, 1945, 69 y.o.   MRN: [id number] Patient complains of dysuria and UTI symptoms. Started Cipro today. Patient aware that the medication may have to be changed based on results.

## 2014-09-10 LAB — URINALYSIS, COMPLETE
BACTERIA UA: NONE SEEN
Bilirubin Urine: NEGATIVE
Crystals: NONE SEEN
Glucose, UA: NEGATIVE mg/dL
Nitrite: NEGATIVE
PH: 5.5 (ref 5.0–8.0)
Protein, ur: NEGATIVE mg/dL
SQUAMOUS EPITHELIAL / LPF: NONE SEEN
Specific Gravity, Urine: <1.005 — ABNORMAL LOW (ref 1.005–1.030)
Urobilinogen, UA: 0.2 mg/dL (ref 0.0–1.0)

## 2014-09-12 LAB — URINE CULTURE: Colony Count: 40000

## 2014-09-15 ENCOUNTER — Other Ambulatory Visit: Payer: Self-pay | Admitting: Internal Medicine

## 2014-09-15 DIAGNOSIS — N390 Urinary tract infection, site not specified: Secondary | ICD-10-CM

## 2014-09-15 MED ORDER — SULFAMETHOXAZOLE-TRIMETHOPRIM 800-160 MG PO TABS: ORAL_TABLET | ORAL | Status: DC

## 2014-10-11 ENCOUNTER — Other Ambulatory Visit: Payer: Self-pay | Admitting: Internal Medicine

## 2014-10-12 ENCOUNTER — Ambulatory Visit (INDEPENDENT_AMBULATORY_CARE_PROVIDER_SITE_OTHER): Payer: Medicare Other | Admitting: *Deleted

## 2014-10-12 ENCOUNTER — Other Ambulatory Visit: Payer: Self-pay | Admitting: Internal Medicine

## 2014-10-12 DIAGNOSIS — N390 Urinary tract infection, site not specified: Secondary | ICD-10-CM

## 2014-10-12 NOTE — Progress Notes (Signed)
Patient ID: Deborah [name], female   DOB: 1945-05-16, 69 y.o.   MRN: [id number] Patient presents for 1 month f/u recheck UA, C&S.

## 2014-10-13 LAB — URINALYSIS, ROUTINE W REFLEX MICROSCOPIC
BILIRUBIN URINE: NEGATIVE
Glucose, UA: NEGATIVE mg/dL
Hgb urine dipstick: NEGATIVE
Ketones, ur: NEGATIVE mg/dL
Leukocytes, UA: NEGATIVE
Nitrite: NEGATIVE
PROTEIN: NEGATIVE mg/dL
Specific Gravity, Urine: 1.005 (ref 1.005–1.030)
UROBILINOGEN UA: 0.2 mg/dL (ref 0.0–1.0)
pH: 6 (ref 5.0–8.0)

## 2014-10-13 LAB — URINE CULTURE
Colony Count: NO GROWTH
Organism ID, Bacteria: NO GROWTH

## 2014-10-30 ENCOUNTER — Ambulatory Visit: Payer: Self-pay | Admitting: Internal Medicine

## 2014-11-12 ENCOUNTER — Ambulatory Visit (INDEPENDENT_AMBULATORY_CARE_PROVIDER_SITE_OTHER): Payer: Medicare Other | Admitting: Internal Medicine

## 2014-11-12 ENCOUNTER — Encounter: Payer: Self-pay | Admitting: Internal Medicine

## 2014-11-12 VITALS — BP 104/78 | HR 64 | Temp 97.6°F | Resp 16 | Ht 66.5 in | Wt 168.8 lb

## 2014-11-12 DIAGNOSIS — R7303 Prediabetes: Secondary | ICD-10-CM

## 2014-11-12 DIAGNOSIS — E785 Hyperlipidemia, unspecified: Secondary | ICD-10-CM

## 2014-11-12 DIAGNOSIS — G43009 Migraine without aura, not intractable, without status migrainosus: Secondary | ICD-10-CM

## 2014-11-12 DIAGNOSIS — E559 Vitamin D deficiency, unspecified: Secondary | ICD-10-CM

## 2014-11-12 DIAGNOSIS — R7309 Other abnormal glucose: Secondary | ICD-10-CM

## 2014-11-12 DIAGNOSIS — Z6826 Body mass index (BMI) 26.0-26.9, adult: Secondary | ICD-10-CM

## 2014-11-12 DIAGNOSIS — Z79899 Other long term (current) drug therapy: Secondary | ICD-10-CM

## 2014-11-12 DIAGNOSIS — I10 Essential (primary) hypertension: Secondary | ICD-10-CM

## 2014-11-12 MED ORDER — TOPIRAMATE 100 MG PO TABS: ORAL_TABLET | ORAL | Status: DC

## 2014-11-12 NOTE — Progress Notes (Signed)
Patient ID: Deborah [name], female   DOB: 12-Sep-1945, 69 y.o.   MRN: [id number]   This very nice 69 y.o. MWF presents for 3 month follow up with Hypertension, Hyperlipidemia, Pre-Diabetes and Vitamin D Deficiency. Patient report increased frequency of her common migraines and requests to restart Topomax.    Patient is treated for HTN & BP has been controlled at home. Today's BP: 104/78 mmHg. Patient has had no complaints of any cardiac type chest pain, palpitations, dyspnea/orthopnea/PND, dizziness, claudication, or dependent edema.   Hyperlipidemia is controlled with diet & meds. Patient denies myalgias or other med SE's. Last Lipids were at goal -  Cholesterol 192; HDL 86; LDL 86; Triglycerides 98 on 08/06/2014.   Also, the patient has history of PreDiabetes and has had no symptoms of reactive hypoglycemia, diabetic polys, paresthesias or visual blurring.  Last A1c was  6.0% on 08/06/2014.   Further, the patient also has history of Vitamin D Deficiency and supplements vitamin D without any suspected side-effects. Last vitamin D was  on 08/06/2014.   Medication Sig  . ALPRAZolam  1 MG tablet TAKE 1/2 -1 TAB TWICE DAILY TO THREE TIMES DAILY AS NEEDED FOR ANXIETY  . aspirin 81 MG tablet Take 81 mg by mouth daily.  . B Complex Vitamins  Take 1 capsule by mouth daily.  Marland Kitchen BIOTIN PO Take by mouth. Patient takes 10000 mcg plus Keratin 1 daily  . VITAMIN D  Take 5,000 Units by mouth daily.  . Multiple Vitamin  Take 1 tablet by mouth daily.  . Magnesium with chelate Zinc  1 daily  . Advanced Melatonin  1 daily  . rosuvastatin 20 MG tablet Take 1 tablet (20 mg total) by mouth daily.  . meloxicam 15 MG tablet TAKE ONE TABLET BY MOUTH ONCE DAILY AS NEEDED FOR PAIN AND  INFLAMMATION  . zolmitriptan (ZOMIG) 5 MG nasal solution 1 Spray nasal stat for migraine and may repeat 1 x in 2 hours   Allergies  Allergen Reactions  . Banana     Nausea and vomiting  . Effexor [Venlafaxine]     insomnia  . Tussin  [Guaifenesin]     rash  . Zostavax [Zoster Vaccine Live]    PMHx:   Past Medical History  Diagnosis Date  . Hypertension   . Hyperlipidemia   . Arthritis   . Allergy   . Migraines   . Vitamin D deficiency   . Prediabetes   . RLS (restless legs syndrome)   . Anxiety   . Depression   . Anemia    Immunization History  Administered Date(s) Administered  . Influenza, High Dose Seasonal PF 01/01/2014  . Influenza-Unspecified 12/26/2012  . Pneumococcal Conjugate-13 08/06/2014  . Pneumococcal-Unspecified 12/26/2012  . Td 12/26/2012  . Zoster 12/08/2010   Past Surgical History  Procedure Laterality Date  . Spine surgery  2010    C6-C7 fusion  . Mohs surgery Right 04/2013    Squamous cell cacinoma  . Knee arthroscopy Bilateral 04/2014    [facility]   FHx:    Reviewed / unchanged  SHx:    Reviewed / unchanged  Systems Review:  Constitutional: Denies fever, chills, wt changes, headaches, insomnia, fatigue, night sweats, change in appetite. Eyes: Denies redness, blurred vision, diplopia, discharge, itchy, watery eyes.  ENT: Denies discharge, congestion, post nasal drip, epistaxis, sore throat, earache, hearing loss, dental pain, tinnitus, vertigo, sinus pain, snoring.  CV: Denies chest pain, palpitations, irregular heartbeat, syncope, dyspnea, diaphoresis, orthopnea, PND, claudication  or edema. Respiratory: denies cough, dyspnea, DOE, pleurisy, hoarseness, laryngitis, wheezing.  Gastrointestinal: Denies dysphagia, odynophagia, heartburn, reflux, water brash, abdominal pain or cramps, nausea, vomiting, bloating, diarrhea, constipation, hematemesis, melena, hematochezia  or hemorrhoids. Genitourinary: Denies dysuria, frequency, urgency, nocturia, hesitancy, discharge, hematuria or flank pain. Musculoskeletal: Denies arthralgias, myalgias, stiffness, jt. swelling, pain, limping or strain/sprain.  Skin: Denies pruritus, rash, hives, warts, acne, eczema or change in skin  lesion(s). Neuro: No weakness, tremor, incoordination, spasms, paresthesia or pain. Psychiatric: Denies confusion, memory loss or sensory loss. Endo: Denies change in weight, skin or hair change.  Heme/Lymph: No excessive bleeding, bruising or enlarged lymph nodes.  Physical Exam  BP 104/78 mmHg  Pulse 64  Temp(Src) 97.6 F (36.4 C)  Resp 16  Ht 5' 6.5" (1.689 m)  Wt 168 lb 12.8 oz (76.567 kg)  BMI 26.84 kg/m2  Appears well nourished and in no distress. Eyes: PERRLA, EOMs, conjunctiva no swelling or erythema. Sinuses: No frontal/maxillary tenderness ENT/Mouth: EAC's clear, TM's nl w/o erythema, bulging. Nares clear w/o erythema, swelling, exudates. Oropharynx clear without erythema or exudates. Oral hygiene is good. Tongue normal, non obstructing. Hearing intact.  Neck: Supple. Thyroid nl. Car 2+/2+ without bruits, nodes or JVD. Chest: Respirations nl with BS clear & equal w/o rales, rhonchi, wheezing or stridor.  Cor: Heart sounds normal w/ regular rate and rhythm without sig. murmurs, gallops, clicks, or rubs. Peripheral pulses normal and equal  without edema.  Abdomen: Soft & bowel sounds normal. Non-tender w/o guarding, rebound, hernias, masses, or organomegaly.  Lymphatics: Unremarkable.  Musculoskeletal: Full ROM all peripheral extremities, joint stability, 5/5 strength, and normal gait.  Skin: Warm, dry without exposed rashes, lesions or ecchymosis apparent.  Neuro: Cranial nerves intact, reflexes equal bilaterally. Sensory-motor testing grossly intact. Tendon reflexes grossly intact.  Pysch: Alert & oriented x 3.  Insight and judgement nl & appropriate. No ideations.  Assessment and Plan:  1. Essential hypertension  - TSH  2. Hyperlipidemia  - Lipid panel  3. Prediabetes  - Hemoglobin A1c - Insulin, random  4. Vitamin D deficiency  - Vit D  25 hydroxy   5. Medication management  - CBC with Differential/Platelet - BASIC METABOLIC PANEL WITH GFR - Hepatic  function panel - Magnesium  6. Migraine  - Restart Topiramate 100 mg bid for HA prophylaxis   Recommended regular exercise, BP monitoring, weight control, and discussed med and SE's. Recommended labs to assess and monitor clinical status. Further disposition pending results of labs. Over 30 minutes of exam, counseling, chart review was performed

## 2014-11-12 NOTE — Patient Instructions (Signed)

## 2014-11-13 LAB — BASIC METABOLIC PANEL WITH GFR
BUN: 12 mg/dL (ref 7–25)
CALCIUM: 9.6 mg/dL (ref 8.6–10.4)
CO2: 28 meq/L (ref 20–31)
Chloride: 101 mEq/L (ref 98–110)
Creat: 0.83 mg/dL (ref 0.50–0.99)
GFR, EST AFRICAN AMERICAN: 84 mL/min (ref >=60)
GFR, EST NON AFRICAN AMERICAN: 73 mL/min (ref >=60)
Glucose, Bld: 72 mg/dL (ref 65–99)
POTASSIUM: 4.7 meq/L (ref 3.5–5.3)
SODIUM: 135 meq/L (ref 135–146)

## 2014-11-13 LAB — CBC WITH DIFFERENTIAL/PLATELET
Basophils Absolute: 0.1 10*3/uL (ref 0.0–0.1)
Basophils Relative: 2 % — ABNORMAL HIGH (ref 0–1)
EOS ABS: 0.1 10*3/uL (ref 0.0–0.7)
Eosinophils Relative: 3 % (ref 0–5)
HCT: 34.5 % — ABNORMAL LOW (ref 36.0–46.0)
Hemoglobin: 11.5 g/dL — ABNORMAL LOW (ref 12.0–15.0)
Lymphocytes Relative: 42 % (ref 12–46)
Lymphs Abs: 2 10*3/uL (ref 0.7–4.0)
MCH: 29.1 pg (ref 26.0–34.0)
MCHC: 33.3 g/dL (ref 30.0–36.0)
MCV: 87.3 fL (ref 78.0–100.0)
MONO ABS: 0.6 10*3/uL (ref 0.1–1.0)
MPV: 9.2 fL (ref 8.6–12.4)
Monocytes Relative: 12 % (ref 3–12)
NEUTROS PCT: 41 % — AB (ref 43–77)
Neutro Abs: 2 10*3/uL (ref 1.7–7.7)
Platelets: 264 10*3/uL (ref 150–400)
RBC: 3.95 MIL/uL (ref 3.87–5.11)
RDW: 15 % (ref 11.5–15.5)
WBC: 4.8 10*3/uL (ref 4.0–10.5)

## 2014-11-13 LAB — MAGNESIUM: Magnesium: 2.2 mg/dL (ref 1.5–2.5)

## 2014-11-13 LAB — HEPATIC FUNCTION PANEL
ALK PHOS: 69 U/L (ref 33–130)
ALT: 10 U/L (ref 6–29)
AST: 20 U/L (ref 10–35)
Albumin: 4.2 g/dL (ref 3.6–5.1)
Bilirubin, Direct: 0.1 mg/dL (ref <=0.2)
Indirect Bilirubin: 0.3 mg/dL (ref 0.2–1.2)
Total Bilirubin: 0.4 mg/dL (ref 0.2–1.2)
Total Protein: 6.5 g/dL (ref 6.1–8.1)

## 2014-11-13 LAB — LIPID PANEL
CHOLESTEROL: 177 mg/dL (ref 125–200)
HDL: 77 mg/dL (ref >=46)
LDL Cholesterol: 89 mg/dL (ref <130)
TRIGLYCERIDES: 57 mg/dL (ref <150)
Total CHOL/HDL Ratio: 2.3 Ratio (ref <=5.0)
VLDL: 11 mg/dL (ref <30)

## 2014-11-13 LAB — HEMOGLOBIN A1C
Hgb A1c MFr Bld: 5.8 % — ABNORMAL HIGH (ref <5.7)
Mean Plasma Glucose: 120 mg/dL — ABNORMAL HIGH (ref <117)

## 2014-11-13 LAB — INSULIN, RANDOM: Insulin: 4.3 u[IU]/mL (ref 2.0–19.6)

## 2014-11-13 LAB — TSH: TSH: 3.625 u[IU]/mL (ref 0.350–4.500)

## 2014-11-13 LAB — VITAMIN D 25 HYDROXY (VIT D DEFICIENCY, FRACTURES): Vit D, 25-Hydroxy: 68 ng/mL (ref 30–100)

## 2014-11-16 ENCOUNTER — Other Ambulatory Visit: Payer: Self-pay | Admitting: Internal Medicine

## 2015-01-07 ENCOUNTER — Encounter: Payer: Self-pay | Admitting: Internal Medicine

## 2015-03-12 DIAGNOSIS — Z6822 Body mass index (BMI) 22.0-22.9, adult: Secondary | ICD-10-CM | POA: Insufficient documentation

## 2015-03-12 DIAGNOSIS — Z6824 Body mass index (BMI) 24.0-24.9, adult: Secondary | ICD-10-CM | POA: Insufficient documentation

## 2015-03-15 ENCOUNTER — Encounter: Payer: Self-pay | Admitting: Internal Medicine

## 2015-03-15 ENCOUNTER — Ambulatory Visit (INDEPENDENT_AMBULATORY_CARE_PROVIDER_SITE_OTHER): Payer: Medicare Other | Admitting: Internal Medicine

## 2015-03-15 VITALS — BP 122/68 | HR 96 | Temp 97.9°F | Resp 16 | Ht 66.5 in | Wt 153.2 lb

## 2015-03-15 DIAGNOSIS — G43009 Migraine without aura, not intractable, without status migrainosus: Secondary | ICD-10-CM

## 2015-03-15 DIAGNOSIS — Z1331 Encounter for screening for depression: Secondary | ICD-10-CM

## 2015-03-15 DIAGNOSIS — I10 Essential (primary) hypertension: Secondary | ICD-10-CM | POA: Diagnosis not present

## 2015-03-15 DIAGNOSIS — E559 Vitamin D deficiency, unspecified: Secondary | ICD-10-CM

## 2015-03-15 DIAGNOSIS — Z0001 Encounter for general adult medical examination with abnormal findings: Secondary | ICD-10-CM

## 2015-03-15 DIAGNOSIS — Z789 Other specified health status: Secondary | ICD-10-CM | POA: Diagnosis not present

## 2015-03-15 DIAGNOSIS — Z0000 Encounter for general adult medical examination without abnormal findings: Secondary | ICD-10-CM | POA: Diagnosis not present

## 2015-03-15 DIAGNOSIS — E785 Hyperlipidemia, unspecified: Secondary | ICD-10-CM

## 2015-03-15 DIAGNOSIS — Z9181 History of falling: Secondary | ICD-10-CM

## 2015-03-15 DIAGNOSIS — R7303 Prediabetes: Secondary | ICD-10-CM

## 2015-03-15 DIAGNOSIS — Z23 Encounter for immunization: Secondary | ICD-10-CM | POA: Diagnosis not present

## 2015-03-15 DIAGNOSIS — Z6826 Body mass index (BMI) 26.0-26.9, adult: Secondary | ICD-10-CM

## 2015-03-15 DIAGNOSIS — Z1212 Encounter for screening for malignant neoplasm of rectum: Secondary | ICD-10-CM

## 2015-03-15 DIAGNOSIS — Z79899 Other long term (current) drug therapy: Secondary | ICD-10-CM

## 2015-03-15 DIAGNOSIS — Z1389 Encounter for screening for other disorder: Secondary | ICD-10-CM | POA: Diagnosis not present

## 2015-03-15 LAB — CBC WITH DIFFERENTIAL/PLATELET
BASOS ABS: 0.1 10*3/uL (ref 0.0–0.1)
Basophils Relative: 1 % (ref 0–1)
EOS ABS: 0.2 10*3/uL (ref 0.0–0.7)
EOS PCT: 4 % (ref 0–5)
HCT: 38.5 % (ref 36.0–46.0)
Hemoglobin: 12.6 g/dL (ref 12.0–15.0)
LYMPHS PCT: 35 % (ref 12–46)
Lymphs Abs: 2 10*3/uL (ref 0.7–4.0)
MCH: 30 pg (ref 26.0–34.0)
MCHC: 32.7 g/dL (ref 30.0–36.0)
MCV: 91.7 fL (ref 78.0–100.0)
MONO ABS: 0.6 10*3/uL (ref 0.1–1.0)
MPV: 10.3 fL (ref 8.6–12.4)
Monocytes Relative: 11 % (ref 3–12)
Neutro Abs: 2.7 10*3/uL (ref 1.7–7.7)
Neutrophils Relative %: 49 % (ref 43–77)
PLATELETS: 297 10*3/uL (ref 150–400)
RBC: 4.2 MIL/uL (ref 3.87–5.11)
RDW: 14 % (ref 11.5–15.5)
WBC: 5.6 10*3/uL (ref 4.0–10.5)

## 2015-03-15 LAB — BASIC METABOLIC PANEL WITH GFR
BUN: 12 mg/dL (ref 7–25)
CALCIUM: 10 mg/dL (ref 8.6–10.4)
CO2: 25 mmol/L (ref 20–31)
CREATININE: 0.84 mg/dL (ref 0.50–0.99)
Chloride: 105 mmol/L (ref 98–110)
GFR, Est African American: 83 mL/min (ref >=60)
GFR, Est Non African American: 72 mL/min (ref >=60)
Glucose, Bld: 91 mg/dL (ref 65–99)
Potassium: 3.8 mmol/L (ref 3.5–5.3)
SODIUM: 139 mmol/L (ref 135–146)

## 2015-03-15 LAB — HEPATIC FUNCTION PANEL
ALT: 10 U/L (ref 6–29)
AST: 20 U/L (ref 10–35)
Albumin: 4.5 g/dL (ref 3.6–5.1)
Alkaline Phosphatase: 72 U/L (ref 33–130)
BILIRUBIN DIRECT: 0.1 mg/dL (ref <=0.2)
Indirect Bilirubin: 0.4 mg/dL (ref 0.2–1.2)
TOTAL PROTEIN: 6.9 g/dL (ref 6.1–8.1)
Total Bilirubin: 0.5 mg/dL (ref 0.2–1.2)

## 2015-03-15 LAB — LIPID PANEL
CHOL/HDL RATIO: 2.8 ratio (ref <=5.0)
CHOLESTEROL: 179 mg/dL (ref 125–200)
HDL: 64 mg/dL (ref >=46)
LDL Cholesterol: 89 mg/dL (ref <130)
Triglycerides: 129 mg/dL (ref <150)
VLDL: 26 mg/dL (ref <30)

## 2015-03-15 LAB — MAGNESIUM: MAGNESIUM: 2.2 mg/dL (ref 1.5–2.5)

## 2015-03-15 NOTE — Progress Notes (Signed)
Patient ID: Deborah [name], female   DOB: 03-Jun-1945, 69 y.o.   MRN: [id number]  Annual Screening/Preventative Visit  And Comprehensive Evaluation &  Examination  This very nice 69 y.o. MWF presents for presents for a Wellness/Preventative Visit & comprehensive evaluation and management of multiple medical co-morbidities.  Patient has been followed for HTN, Prediabetes, Hyperlipidemia and Vitamin D Deficiency. Since last Ov patient has had excision of a skin cancer drom her R eye lower lid.     Labile HTN predates circa 2010 and has been monitored expectantly. Patient's BP has been controlled at home and patient denies any cardiac symptoms as chest pain, palpitations, shortness of breath, dizziness or ankle swelling. Today's BP: 122/68 mmHg    Patient's hyperlipidemia is controlled with diet and medications. Patient denies myalgias or other medication SE's. Last lipids were  Cholesterol 177; HDL 77; LDL Cholesterol 89; Triglycerides 57 on 11/12/2014.   Patient has prediabetes predating since 2011 with A1c 6.1% and patient denies reactive hypoglycemic symptoms, visual blurring, diabetic polys, or paresthesias. Patient has lost ~ 20# since that time and last A1c was 5.8% on 11/12/2014.   Finally, patient has history of Vitamin D Deficiency of 18 in 2008 and last Vitamin D was 68 on 11/12/2014.   Medication Sig  . ALPRAZolam (XANAX) 1 MG tablet TAKE ONE-HALF TO ONE TABLET BY MOUTH TWICE DAILY TO THREE TIMES DAILY AS NEEDED FOR ANXIETY  . aspirin 81 MG tablet Take 81 mg by mouth daily.  . B Complex Vitamins (VITAMIN-B COMPLEX PO) Take 1 capsule by mouth daily.  Marland Kitchen BIOTIN PO Take by mouth. Patient takes 10000 mcg plus Keratin 1 daily  . Cholecalciferol (VITAMIN D PO) Take 5,000 Units by mouth daily.  . CRESTOR 20 MG tablet TAKE ONE TABLET BY MOUTH ONCE DAILY  . Multiple Vitamin (MULTIVITAMIN) tablet Take 1 tablet by mouth daily.  Marland Kitchen OVER THE COUNTER MEDICATION Magnesium with chelate Zinc 1 deaily  . OVER  THE COUNTER MEDICATION Advanced Melatonin 1 daily  . SUMAtriptan (IMITREX) 20 MG/ACT nasal spray   . topiramate (TOPAMAX) 100 MG tablet Take 1 tablet 2 x day for Headache Prevention   No current facility-administered medications on file prior to visit.   Allergies  Allergen Reactions  . Banana     Nausea and vomiting  . Effexor [Venlafaxine]     insomnia  . Tussin [Guaifenesin]     rash  . Zostavax [Zoster Vaccine Live]    Past Medical History  Diagnosis Date  . Hypertension   . Hyperlipidemia   . Arthritis   . Allergy   . Migraines   . Vitamin D deficiency   . Prediabetes   . RLS (restless legs syndrome)   . Anxiety   . Depression   . Anemia    Health Maintenance  Topic Date Due  . Hepatitis C Screening  1945-07-14  . INFLUENZA VACCINE  11/16/2014  . MAMMOGRAM  12/26/2015  . COLONOSCOPY  11/23/2016  . TETANUS/TDAP  12/27/2022  . DEXA SCAN  Completed  . ZOSTAVAX  Completed  . PNA vac Low Risk Adult  Completed   Immunization History  Administered Date(s) Administered  . Influenza, High Dose Seasonal PF 01/01/2014  . Influenza-Unspecified 12/26/2012  . Pneumococcal Conjugate-13 08/06/2014  . Pneumococcal-Unspecified 12/26/2012  . Td 12/26/2012  . Zoster 12/08/2010   Past Surgical History  Procedure Laterality Date  . Spine surgery  2010    C6-C7 fusion  . Mohs surgery Right 04/2013  Squamous cell cacinoma  . Knee arthroscopy Bilateral 04/2014    [facility]   Family History  Problem Relation Age of Onset  . Diabetes Mother   . Heart disease Mother   . Heart disease Father   . Stroke Father   . Hypertension Father   . Hyperlipidemia Sister   . Hypertension Brother    Social History  Substance Use Topics  . Smoking status: Former Smoker -- 1.00 packs/day for 4 years    Types: Cigarettes    Quit date: 03/25/1986  . Smokeless tobacco: Never Used  . Alcohol Use: Yes     Comment: rare    ROS Constitutional: Denies fever, chills, weight  loss/gain, headaches, insomnia,  night sweats, and change in appetite. Does c/o fatigue. Eyes: Denies redness, blurred vision, diplopia, discharge, itchy, watery eyes.  ENT: Denies discharge, congestion, post nasal drip, epistaxis, sore throat, earache, hearing loss, dental pain, Tinnitus, Vertigo, Sinus pain, snoring.  Cardio: Denies chest pain, palpitations, irregular heartbeat, syncope, dyspnea, diaphoresis, orthopnea, PND, claudication, edema Respiratory: denies cough, dyspnea, DOE, pleurisy, hoarseness, laryngitis, wheezing.  Gastrointestinal: Denies dysphagia, heartburn, reflux, water brash, pain, cramps, nausea, vomiting, bloating, diarrhea, constipation, hematemesis, melena, hematochezia, jaundice, hemorrhoids Genitourinary: Denies dysuria, frequency, urgency, nocturia, hesitancy, discharge, hematuria, flank pain Breast: Breast lumps, nipple discharge, bleeding.  Musculoskeletal: Denies arthralgia, myalgia, stiffness, Jt. Swelling, pain, limp, and strain/sprain. Denies falls. Skin: Denies puritis, rash, hives, warts, acne, eczema, changing in skin lesion Neuro: No weakness, tremor, incoordination, spasms, paresthesia, pain Psychiatric: Denies confusion, memory loss, sensory loss. Denies Depression. Endocrine: Denies change in weight, skin, hair change, nocturia, and paresthesia, diabetic polys, visual blurring, hyper / hypo glycemic episodes.  Heme/Lymph: No excessive bleeding, bruising, enlarged lymph nodes.  Physical Exam  BP 122/68 mmHg  Pulse 96  Temp(Src) 97.9 F (36.6 C)  Resp 16  Ht 5' 6.5" (1.689 m)  Wt 153 lb 3.2 oz (69.491 kg)  BMI 24.36 kg/m2  General Appearance: Well nourished and in no apparent distress. Eyes: PERRLA, EOMs, conjunctiva no swelling or erythema, normal fundi and vessels. Eyelids appear unremarkable w/o signs of asymmetry or deformity.  Sinuses: No frontal/maxillary tenderness ENT/Mouth: EACs patent / TMs  nl. Nares clear without erythema, swelling,  mucoid exudates. Oral hygiene is good. No erythema, swelling, or exudate. Tongue normal, non-obstructing. Tonsils not swollen or erythematous. Hearing normal.  Neck: Supple, thyroid normal. No bruits, nodes or JVD. Respiratory: Respiratory effort normal.  BS equal and clear bilateral without rales, rhonci, wheezing or stridor. Cardio: Heart sounds are normal with regular rate and rhythm and no murmurs, rubs or gallops. Peripheral pulses are normal and equal bilaterally without edema. No aortic or femoral bruits. Chest: symmetric with normal excursions and percussion. Breasts: Symmetric, without lumps, nipple discharge, retractions, or fibrocystic changes.  Abdomen: Flat, soft, with bowl sounds. Nontender, no guarding, rebound, hernias, masses, or organomegaly.  Lymphatics: Non tender without lymphadenopathy.  Musculoskeletal: Full ROM all peripheral extremities, joint stability, 5/5 strength, and normal gait. Skin: Warm and dry without rashes, lesions, cyanosis, clubbing or  ecchymosis.  Neuro: Cranial nerves intact, reflexes equal bilaterally. Normal muscle tone, no cerebellar symptoms. Sensation intact.  Pysch: Awake and oriented X 3, normal affect, Insight and Judgment appropriate.   Assessment and Plan  1. Encounter for general adult medical examination with abnormal findings   2. Essential hypertension  - Microalbumin / creatinine urine ratio - EKG 12-Lead - [place], RETROPERITNL ABD,  LTD - TSH  3. Hyperlipidemia  - Lipid panel - TSH  4. Prediabetes  - Hemoglobin A1c - Insulin, random  5. Vitamin D deficiency  - VITAMIN D 25 Hydroxy   6. Migraine without aura and without status migrainosus, not intractable   7. BMI 26.0-26.9,adult   8. Screening for rectal cancer  - POC Hemoccult Bld/Stl   9. Depression screen   10. At low risk for fall   11. Medication management  - Urinalysis, Routine w reflex microscopic  - CBC with Differential/Platelet - BASIC  METABOLIC PANEL WITH GFR - Hepatic function panel - Magnesium  12. Need for prophylactic vaccination and inoculation against influenza - Flu vaccine HIGH DOSE PF (Fluzone High Dose)   Continue prudent diet as discussed, weight control, BP monitoring, regular exercise, and medications. Discussed med's effects and SE's. Screening labs and tests as requested with regular follow-up as recommended.

## 2015-03-15 NOTE — Patient Instructions (Signed)

## 2015-03-16 LAB — MICROALBUMIN / CREATININE URINE RATIO
Creatinine, Urine: 63 mg/dL (ref 20–320)
MICROALB UR: 0.5 mg/dL
MICROALB/CREAT RATIO: 8 ug/mg{creat} (ref <30)

## 2015-03-16 LAB — URINALYSIS, ROUTINE W REFLEX MICROSCOPIC
Bilirubin Urine: NEGATIVE
Glucose, UA: NEGATIVE
HGB URINE DIPSTICK: NEGATIVE
Leukocytes, UA: NEGATIVE
NITRITE: NEGATIVE
PROTEIN: NEGATIVE
SPECIFIC GRAVITY, URINE: 1.011 (ref 1.001–1.035)
pH: 6 (ref 5.0–8.0)

## 2015-03-16 LAB — INSULIN, RANDOM: INSULIN: 4 u[IU]/mL (ref 2.0–19.6)

## 2015-03-16 LAB — VITAMIN D 25 HYDROXY (VIT D DEFICIENCY, FRACTURES): VIT D 25 HYDROXY: 103 ng/mL — AB (ref 30–100)

## 2015-03-16 LAB — TSH: TSH: 1.69 u[IU]/mL (ref 0.350–4.500)

## 2015-03-16 LAB — HEMOGLOBIN A1C
HEMOGLOBIN A1C: 5.9 % — AB (ref <5.7)
MEAN PLASMA GLUCOSE: 123 mg/dL — AB (ref <117)

## 2015-03-18 ENCOUNTER — Other Ambulatory Visit: Payer: Self-pay | Admitting: Internal Medicine

## 2015-04-15 ENCOUNTER — Other Ambulatory Visit: Payer: Self-pay | Admitting: Internal Medicine

## 2015-04-21 ENCOUNTER — Other Ambulatory Visit: Payer: Self-pay | Admitting: Physician Assistant

## 2015-04-21 MED ORDER — RIZATRIPTAN BENZOATE 10 MG PO TBDP: 10.0000 mg | ORAL_TABLET | ORAL | Status: DC | PRN

## 2015-05-31 ENCOUNTER — Other Ambulatory Visit: Payer: Self-pay | Admitting: Internal Medicine

## 2015-06-17 ENCOUNTER — Ambulatory Visit: Payer: Self-pay | Admitting: Internal Medicine

## 2015-06-17 ENCOUNTER — Encounter: Payer: Self-pay | Admitting: Internal Medicine

## 2015-06-17 ENCOUNTER — Ambulatory Visit (INDEPENDENT_AMBULATORY_CARE_PROVIDER_SITE_OTHER): Payer: Medicare Other | Admitting: Internal Medicine

## 2015-06-17 VITALS — BP 126/82 | HR 96 | Temp 98.0°F | Resp 18 | Ht 66.5 in | Wt 150.0 lb

## 2015-06-17 DIAGNOSIS — C4490 Unspecified malignant neoplasm of skin, unspecified: Secondary | ICD-10-CM | POA: Diagnosis not present

## 2015-06-17 DIAGNOSIS — R6889 Other general symptoms and signs: Secondary | ICD-10-CM

## 2015-06-17 DIAGNOSIS — Z6826 Body mass index (BMI) 26.0-26.9, adult: Secondary | ICD-10-CM | POA: Diagnosis not present

## 2015-06-17 DIAGNOSIS — R7303 Prediabetes: Secondary | ICD-10-CM

## 2015-06-17 DIAGNOSIS — E785 Hyperlipidemia, unspecified: Secondary | ICD-10-CM

## 2015-06-17 DIAGNOSIS — I10 Essential (primary) hypertension: Secondary | ICD-10-CM | POA: Diagnosis not present

## 2015-06-17 DIAGNOSIS — E559 Vitamin D deficiency, unspecified: Secondary | ICD-10-CM | POA: Diagnosis not present

## 2015-06-17 DIAGNOSIS — Z0000 Encounter for general adult medical examination without abnormal findings: Secondary | ICD-10-CM

## 2015-06-17 DIAGNOSIS — F411 Generalized anxiety disorder: Secondary | ICD-10-CM

## 2015-06-17 DIAGNOSIS — Z79899 Other long term (current) drug therapy: Secondary | ICD-10-CM | POA: Diagnosis not present

## 2015-06-17 DIAGNOSIS — N393 Stress incontinence (female) (male): Secondary | ICD-10-CM

## 2015-06-17 DIAGNOSIS — R32 Unspecified urinary incontinence: Secondary | ICD-10-CM

## 2015-06-17 DIAGNOSIS — Z0001 Encounter for general adult medical examination with abnormal findings: Secondary | ICD-10-CM

## 2015-06-17 DIAGNOSIS — G43009 Migraine without aura, not intractable, without status migrainosus: Secondary | ICD-10-CM

## 2015-06-17 DIAGNOSIS — Z1159 Encounter for screening for other viral diseases: Secondary | ICD-10-CM

## 2015-06-17 LAB — CBC WITH DIFFERENTIAL/PLATELET
Basophils Absolute: 0.1 10*3/uL (ref 0.0–0.1)
Basophils Relative: 1 % (ref 0–1)
EOS ABS: 0.2 10*3/uL (ref 0.0–0.7)
EOS PCT: 4 % (ref 0–5)
HCT: 38 % (ref 36.0–46.0)
Hemoglobin: 12.3 g/dL (ref 12.0–15.0)
LYMPHS ABS: 2.1 10*3/uL (ref 0.7–4.0)
Lymphocytes Relative: 35 % (ref 12–46)
MCH: 29.8 pg (ref 26.0–34.0)
MCHC: 32.4 g/dL (ref 30.0–36.0)
MCV: 92 fL (ref 78.0–100.0)
MONOS PCT: 8 % (ref 3–12)
MPV: 10.5 fL (ref 8.6–12.4)
Monocytes Absolute: 0.5 10*3/uL (ref 0.1–1.0)
Neutro Abs: 3.2 10*3/uL (ref 1.7–7.7)
Neutrophils Relative %: 52 % (ref 43–77)
PLATELETS: 293 10*3/uL (ref 150–400)
RBC: 4.13 MIL/uL (ref 3.87–5.11)
RDW: 12.9 % (ref 11.5–15.5)
WBC: 6.1 10*3/uL (ref 4.0–10.5)

## 2015-06-17 MED ORDER — ROSUVASTATIN CALCIUM 20 MG PO TABS: 20.0000 mg | ORAL_TABLET | Freq: Every day | ORAL | Status: DC

## 2015-06-17 NOTE — Progress Notes (Signed)
MEDICARE ANNUAL WELLNESS VISIT AND FOLLOW UP Assessment:    1. Essential hypertension -cont meds -DASH diet - TSH  2. Hyperlipidemia -cont meds - Lipid panel  3. Prediabetes -cont diet and exercise -cont meds - Hemoglobin A1c  4. Vitamin D deficiency -cont supplement  5. Medication management  - CBC with Differential/Platelet - BASIC METABOLIC PANEL WITH GFR - Hepatic function panel  6. Need for hepatitis C screening test  - Hepatitis C antibody  7. Migraine without aura and without status migrainosus, not intractable -cont imitrex prn  8. Skin cancer -resolved after skin surgery -monitor with derm  9. Generalized anxiety disorder -cont meds -well controlled  10. Incontinence in female -currently well controlled  11. Medicare annual wellness visit, subsequent -due next year  83. BMI 26.0-26.9,adult     Over 30 minutes of exam, counseling, chart review, and critical decision making was performed  Plan:   During the course of the visit the patient was educated and counseled about appropriate screening and preventive services including:    Pneumococcal vaccine   Influenza vaccine  Prevnar 13  Td vaccine  Screening electrocardiogram  Colorectal cancer screening  Diabetes screening  Glaucoma screening  Nutrition counseling   Conditions/risks identified: BMI: Discussed weight loss, diet, and increase physical activity.  Increase physical activity: AHA recommends 150 minutes of physical activity a week.  Medications reviewed Diabetes is at goal, ACE/ARB therapy: No, Reason not on Ace Inhibitor/ARB therapy:  not indicated Urinary Incontinence is not an issue: discussed non pharmacology and pharmacology options.  Fall risk: low- discussed PT, home fall assessment, medications.    Subjective:  Deborah [name] is a 70 y.o. female who presents for Medicare Annual Wellness Visit and 3 month follow up for HTN, hyperlipidemia, prediabetes, and  vitamin D Def.  Date of last medicare wellness visit was is unknown.  Her blood pressure has been controlled at home, today their BP is BP: 126/82 mmHg She does workout. She denies chest pain, shortness of breath, dizziness.  She is not on cholesterol medication and denies myalgias. Her cholesterol is at goal. The cholesterol last visit was:   Lab Results  Component Value Date   CHOL 179 03/15/2015   HDL 64 03/15/2015   LDLCALC 89 03/15/2015   TRIG 129 03/15/2015   CHOLHDL 2.8 03/15/2015   She has been working on diet and exercise for prediabetes, and denies foot ulcerations, hyperglycemia, hypoglycemia , increased appetite, nausea, paresthesia of the feet, polydipsia, polyuria, visual disturbances, vomiting and weight loss. Last A1C in the office was:  Lab Results  Component Value Date   HGBA1C 5.9* 03/15/2015   Last GFR NonAA   Lab Results  Component Value Date   GFRNONAA 72 03/15/2015   AA  Lab Results  Component Value Date   GFRAA 83 03/15/2015   Patient is on Vitamin D supplement.   Lab Results  Component Value Date   VD25OH 103* 03/15/2015      Medication Review: Current Outpatient Prescriptions on File Prior to Visit  Medication Sig Dispense Refill  . ALPRAZolam (XANAX) 1 MG tablet TAKE ONE-HALF TO ONE TABLET BY MOUTH TWICE DAILY TO THREE TIMES DAILY AS NEEDED FOR ANXIETY 90 tablet 0  . aspirin 81 MG tablet Take 81 mg by mouth daily.    . B Complex Vitamins (VITAMIN-B COMPLEX PO) Take 1 capsule by mouth daily.    Marland Kitchen BIOTIN PO Take by mouth. Patient takes 10000 mcg plus Keratin 1 daily    .  Cholecalciferol (VITAMIN D PO) Take 5,000 Units by mouth daily.    . Multiple Vitamin (MULTIVITAMIN) tablet Take 1 tablet by mouth daily.    Marland Kitchen OVER THE COUNTER MEDICATION Magnesium with chelate Zinc 1 deaily    . OVER THE COUNTER MEDICATION Advanced Melatonin 1 daily    . SUMAtriptan (IMITREX) 20 MG/ACT nasal spray USE ONE DOSE IN THE NOSE NOW MAY REPEAT IN 2 HOURS, MAX 2 DOSES  IN 24 HOURS 12 Inhaler 0  . topiramate (TOPAMAX) 100 MG tablet Take 1 tablet 2 x day for Headache Prevention 60 tablet 99   No current facility-administered medications on file prior to visit.    Current Problems (verified) Patient Active Problem List   Diagnosis Date Noted  . Medicare annual wellness visit, subsequent 03/12/2015  . BMI 26.0-26.9,adult 03/12/2015  . Skin cancer 08/06/2014  . Migraine 08/06/2014  . Generalized anxiety disorder 08/06/2014  . Incontinence in female 08/06/2014  . Prediabetes 07/01/2013  . Medication management 07/01/2013  . Hypertension   . Hyperlipidemia   . Vitamin D deficiency     Screening Tests Immunization History  Administered Date(s) Administered  . Influenza, High Dose Seasonal PF 01/01/2014, 03/15/2015  . Influenza-Unspecified 12/26/2012  . Pneumococcal Conjugate-13 08/06/2014  . Pneumococcal-Unspecified 12/26/2012  . Td 12/26/2012  . Zoster 12/08/2010    Preventative care: Last colonoscopy: 2008  Prior vaccinations: TD or Tdap: 2014  Influenza: 2016  Pneumococcal: 2014 Prevnar13: 2016 Shingles/Zostavax: 2012  Names of Other Physician/Practitioners you currently use: 1. [facility] here for primary care 2. Dr. Shirley Muscat, eye doctor, last visit 2016 3. Dr. Aggie Moats, dentist, last visit 2016 Patient Care Team: Unk Pinto, MD as PCP - General (Internal Medicine) Richmond Campbell, MD as Consulting Physician (Gastroenterology) Calton Dach, MD as Referring Physician (Optometry) Berenice Primas, MD as Referring Physician (Orthopedic Surgery) Janann August, MD as Referring Physician (Dermatology)  Past Surgical History  Procedure Laterality Date  . Spine surgery  2010    C6-C7 fusion  . Mohs surgery Right 04/2013    Squamous cell cacinoma  . Knee arthroscopy Bilateral 04/2014    [facility]   Family History  Problem Relation Age of Onset  . Diabetes Mother   . Heart  disease Mother   . Heart disease Father   . Stroke Father   . Hypertension Father   . Hyperlipidemia Sister   . Hypertension Brother    Social History  Substance Use Topics  . Smoking status: Former Smoker -- 1.00 packs/day for 4 years    Types: Cigarettes    Quit date: 03/25/1986  . Smokeless tobacco: Never Used  . Alcohol Use: Yes     Comment: rare    MEDICARE WELLNESS OBJECTIVES: Tobacco use: She does not smoke.  Patient is a former smoker. If yes, counseling given Alcohol Current alcohol use: social drinker Osteoporosis: postmenopausal estrogen deficiency, History of fracture in the past year: no Fall risk: Low Risk Hearing: normal Visual acuity: normal,  does perform annual eye exam Diet: well balanced Physical activity: Current Exercise Habits:: Home exercise routine, Type of exercise: walking (Physical therapy twice weekly), Time (Minutes): 35, Intensity: Moderate Cardiac risk factors: Cardiac Risk Factors include: family history of premature cardiovascular disease;advanced age (>30men, >64 women);dyslipidemia;hypertension Depression/mood screen:   Depression screen [facility] 2/9 06/17/2015  Decreased Interest 0  Down, Depressed, Hopeless 0  PHQ - 2 Score 0    ADLs:  In your present state of health, do you have any difficulty  performing the following activities: 06/17/2015 03/15/2015  Hearing? N N  Vision? N N  Difficulty concentrating or making decisions? N N  Walking or climbing stairs? N N  Dressing or bathing? N N  Doing errands, shopping? N N  Preparing Food and eating ? N -  Using the Toilet? N -  In the past six months, have you accidently leaked urine? N -  Do you have problems with loss of bowel control? N -  Managing your Medications? N -  Managing your Finances? N -  Housekeeping or managing your Housekeeping? N -     Cognitive Testing  Alert? Yes  Normal Appearance?Yes  Oriented to person? Yes  Place? Yes   Time? Yes  Recall of three objects?  Yes  Can  perform simple calculations? Yes  Displays appropriate judgment?Yes  Can read the correct time from a watch face?Yes  EOL planning: Does patient have an advance directive?: Yes Type of Advance Directive: [place], Living will Does patient want to make changes to advanced directive?: Yes - information given   Objective:   Today's Vitals   06/17/15 1531  BP: 126/82  Pulse: 96  Temp: 98 F (36.7 C)  TempSrc: Temporal  Resp: 18  Height: 5' 6.5" (1.689 m)  Weight: 150 lb (68.04 kg)   Body mass index is 23.85 kg/(m^2).  General appearance: alert, no distress, WD/WN, female HEENT: normocephalic, sclerae anicteric, TMs pearly, nares patent, no discharge or erythema, pharynx normal Oral cavity: MMM, no lesions Neck: supple, no lymphadenopathy, no thyromegaly, no masses Heart: RRR, normal S1, S2, no murmurs Lungs: CTA bilaterally, no wheezes, rhonchi, or rales Abdomen: +bs, soft, non tender, non distended, no masses, no hepatomegaly, no splenomegaly Musculoskeletal: nontender, no swelling, no obvious deformity Extremities: no edema, no cyanosis, no clubbing Pulses: 2+ symmetric, upper and lower extremities, normal cap refill Neurological: alert, oriented x 3, CN2-12 intact, strength normal upper extremities and lower extremities, sensation normal throughout, DTRs 2+ throughout, no cerebellar signs, gait normal Psychiatric: normal affect, behavior normal, pleasant   Medicare Attestation I have personally reviewed: The patient's medical and social history Their use of alcohol, tobacco or illicit drugs Their current medications and supplements The patient's functional ability including ADLs,fall risks, home safety risks, cognitive, and hearing and visual impairment Diet and physical activities Evidence for depression or mood disorders  The patient's weight, height, BMI, and visual acuity have been recorded in the chart.  I have made referrals, counseling, and  provided education to the patient based on review of the above and I have provided the patient with a written personalized care plan for preventive services.     Starlyn Skeans, PA-C   06/17/2015

## 2015-06-18 LAB — LIPID PANEL
Cholesterol: 191 mg/dL (ref 125–200)
HDL: 60 mg/dL (ref >=46)
LDL Cholesterol: 99 mg/dL (ref <130)
Total CHOL/HDL Ratio: 3.2 Ratio (ref <=5.0)
Triglycerides: 158 mg/dL — ABNORMAL HIGH (ref <150)
VLDL: 32 mg/dL — ABNORMAL HIGH (ref <30)

## 2015-06-18 LAB — BASIC METABOLIC PANEL WITH GFR
BUN: 11 mg/dL (ref 7–25)
CO2: 23 mmol/L (ref 20–31)
CREATININE: 0.88 mg/dL (ref 0.50–0.99)
Calcium: 9.6 mg/dL (ref 8.6–10.4)
Chloride: 105 mmol/L (ref 98–110)
GFR, EST AFRICAN AMERICAN: 78 mL/min (ref >=60)
GFR, Est Non African American: 67 mL/min (ref >=60)
Glucose, Bld: 140 mg/dL — ABNORMAL HIGH (ref 65–99)
Potassium: 3.5 mmol/L (ref 3.5–5.3)
Sodium: 139 mmol/L (ref 135–146)

## 2015-06-18 LAB — HEPATIC FUNCTION PANEL
ALBUMIN: 4.2 g/dL (ref 3.6–5.1)
ALK PHOS: 67 U/L (ref 33–130)
ALT: 10 U/L (ref 6–29)
AST: 19 U/L (ref 10–35)
BILIRUBIN TOTAL: 0.4 mg/dL (ref 0.2–1.2)
Bilirubin, Direct: 0.1 mg/dL (ref <=0.2)
Indirect Bilirubin: 0.3 mg/dL (ref 0.2–1.2)
TOTAL PROTEIN: 6.6 g/dL (ref 6.1–8.1)

## 2015-06-18 LAB — TSH: TSH: 2.01 m[IU]/L

## 2015-06-18 LAB — HEMOGLOBIN A1C
HEMOGLOBIN A1C: 5.7 % — AB (ref <5.7)
MEAN PLASMA GLUCOSE: 117 mg/dL — AB (ref <117)

## 2015-06-18 LAB — HEPATITIS C ANTIBODY: HCV AB: NEGATIVE

## 2015-07-28 ENCOUNTER — Other Ambulatory Visit: Payer: Self-pay

## 2015-07-28 DIAGNOSIS — Z1231 Encounter for screening mammogram for malignant neoplasm of breast: Secondary | ICD-10-CM

## 2015-08-17 ENCOUNTER — Ambulatory Visit
Admission: RE | Admit: 2015-08-17 | Discharge: 2015-08-17 | Disposition: A | Discharge status: Home / Self Care | Payer: Medicare Other | Source: Ambulatory Visit

## 2015-08-17 DIAGNOSIS — Z1231 Encounter for screening mammogram for malignant neoplasm of breast: Secondary | ICD-10-CM

## 2015-09-17 ENCOUNTER — Ambulatory Visit (INDEPENDENT_AMBULATORY_CARE_PROVIDER_SITE_OTHER): Payer: Medicare Other | Admitting: Physician Assistant

## 2015-09-17 ENCOUNTER — Encounter: Payer: Self-pay | Admitting: Physician Assistant

## 2015-09-17 VITALS — BP 126/64 | HR 106 | Temp 97.9°F | Resp 14 | Ht 66.5 in | Wt 144.0 lb

## 2015-09-17 DIAGNOSIS — H6593 Unspecified nonsuppurative otitis media, bilateral: Secondary | ICD-10-CM | POA: Diagnosis not present

## 2015-09-17 DIAGNOSIS — J309 Allergic rhinitis, unspecified: Secondary | ICD-10-CM | POA: Diagnosis not present

## 2015-09-17 MED ORDER — BENZONATATE 100 MG PO CAPS: 200.0000 mg | ORAL_CAPSULE | Freq: Three times a day (TID) | ORAL | Status: DC | PRN

## 2015-09-17 MED ORDER — FLUTICASONE PROPIONATE 50 MCG/ACT NA SUSP: 2.0000 | Freq: Every day | NASAL | Status: DC

## 2015-09-17 MED ORDER — PREDNISONE 20 MG PO TABS: ORAL_TABLET | ORAL | Status: DC

## 2015-09-17 NOTE — Patient Instructions (Addendum)
Do nasal sprays at night, follow instructions below Get on allergy pill, names are below Do the prednisone.   Call Monday if you are not improving If your hearing is still affected in 1 month, will send to ENT  Your ears and sinuses are connected by the eustachian tube. When your sinuses are inflamed, this can close off the tube and cause fluid to collect in your middle ear. This can then cause dizziness, popping, clicking, ringing, and echoing in your ears. This is often NOT an infection and does NOT require antibiotics, it is caused by inflammation so the treatments help the inflammation. This can take a long time to get better so please be patient.  Here are things you can do to help with this: - Try the Flonase or Nasonex. Remember to spray each nostril twice towards the outer part of your eye.  Do not sniff but instead pinch your nose and tilt your head back to help the medicine get into your sinuses.  The best time to do this is at bedtime.Stop if you get blurred vision or nose bleeds.  -While drinking fluids, pinch and hold nose close and swallow, to help open eustachian tubes to drain fluid behind ear drums. -Please pick one of the over the counter allergy medications below and take it once daily for allergies.  It will also help with fluid behind ear drums. Claritin or loratadine cheapest but likely the weakest  Zyrtec or certizine at night because it can make you sleepy The strongest is allegra or fexafinadine  Cheapest at [employer], [employer], [employer] -can use decongestant over the counter, please do not use if you have high blood pressure or certain heart conditions.   if worsening HA, changes vision/speech, imbalance, weakness go to the ER  Please take the prednisone to help decrease inflammation and therefore decrease symptoms. Take it it with food to avoid GI upset. It can cause increased energy but on the other hand it can make it hard to sleep at night so please take it AT [place], it takes 8-12 hours to start working so it will NOT affect your sleeping if you take it at night with your food!!  If you are diabetic it will increase your sugars so decrease carbs and monitor your sugars closely.     HOW TO TREAT VIRAL COUGH AND COLD SYMPTOMS:  -Symptoms usually last at least 1 week with the worst symptoms being around day 4.  - colds usually start with a sore throat and end with a cough, and the cough can take 2 weeks to get better.  -No antibiotics are needed for colds, flu, sore throats, cough, bronchitis UNLESS symptoms are longer than 7 days OR if you are getting better then get drastically worse.  -There are a lot of combination medications (Dayquil, Nyquil, Vicks 44, tyelnol cold and sinus, ETC). Please look at the ingredients on the back so that you are treating the correct symptoms and not doubling up on medications/ingredients.    Medicines you can use  Nasal congestion  - pseudoephedrine (Sudafed)- behind the counter, do not use if you have high blood pressure, medicine that have -D in them.  - phenylephrine (Sudafed PE) -Dextormethorphan + chlorpheniramine (Coridcidin HBP)- okay if you have high blood pressure -Oxymetazoline (Afrin) nasal spray- LIMIT to 3 days -Saline nasal spray -Neti pot (used distilled or bottled water)  Ear pain/congestion  -pseudoephedrine (sudafed) - Nasonex/flonase nasal spray  Fever  -Acetaminophen (Tyelnol) -Ibuprofen (Advil, motrin,  aleve)  Sore Throat  -Acetaminophen (Tyelnol) -Ibuprofen (Advil, motrin, aleve) -Drink a lot of water -Gargle with salt water - Rest your voice (don't talk) -Throat sprays -Cough drops  Body Aches  -Acetaminophen (Tyelnol) -Ibuprofen (Advil, motrin, aleve)  Headache  -Acetaminophen (Tyelnol) -Ibuprofen (Advil, motrin, aleve) - Exedrin, Exedrin Migraine  Allergy symptoms (cough, sneeze, runny nose, itchy eyes) -Claritin or loratadine cheapest but likely the weakest  -Zyrtec  or certizine at night because it can make you sleepy -The strongest is allegra or fexafinadine  Cheapest at [employer], [employer], [employer]  Cough  -Dextromethorphan (Delsym)- medicine that has DM in it -Guafenesin (Mucinex/Robitussin) - cough drops - drink lots of water  Chest Congestion  -Guafenesin (Mucinex/Robitussin)  Red Itchy Eyes  - Naphcon-A  Upset Stomach  - Bland diet (nothing spicy, greasy, fried, and high acid foods like tomatoes, oranges, berries) -OKAY- cereal, bread, soup, crackers, rice -Eat smaller more frequent meals -reduce caffeine, no alcohol -Loperamide (Imodium-AD) if diarrhea -Prevacid for heart burn  General health when sick  -Hydration -wash your hands frequently -keep surfaces clean -change pillow cases and sheets often -Get fresh air but do not exercise strenuously -Vitamin D, double up on it - Vitamin C -Zinc

## 2015-09-17 NOTE — Progress Notes (Signed)
Subjective:    Patient ID: Deborah [name], female    DOB: November 19, 1945, 70 y.o.   MRN: [id number]  HPI 70 y.o. WF presents with cold symptoms x 2 weeks, went on zpak immediately from husband, states this helped the fever and sore throat, however she continues to have fatigue, ear problems and congestion. Has tried OTC wax removal in left ear but still having trouble hearing. Tried mucinex, on nasal spray as little as possible.   Blood pressure 126/64, pulse 106, temperature 97.9 F (36.6 C), temperature source Temporal, resp. rate 14, height 5' 6.5" (1.689 m), weight 144 lb (65.318 kg), SpO2 98 %.  Past Medical History  Diagnosis Date  . Hypertension   . Hyperlipidemia   . Arthritis   . Allergy   . Migraines   . Vitamin D deficiency   . Prediabetes   . RLS (restless legs syndrome)   . Anxiety   . Depression   . Anemia    Current Outpatient Prescriptions on File Prior to Visit  Medication Sig Dispense Refill  . ALPRAZolam (XANAX) 1 MG tablet TAKE ONE-HALF TO ONE TABLET BY MOUTH TWICE DAILY TO THREE TIMES DAILY AS NEEDED FOR ANXIETY 90 tablet 0  . aspirin 81 MG tablet Take 81 mg by mouth daily.    . B Complex Vitamins (VITAMIN-B COMPLEX PO) Take 1 capsule by mouth daily.    Marland Kitchen BIOTIN PO Take by mouth. Patient takes 10000 mcg plus Keratin 1 daily    . Cholecalciferol (VITAMIN D PO) Take 5,000 Units by mouth daily.    . Multiple Vitamin (MULTIVITAMIN) tablet Take 1 tablet by mouth daily.    Marland Kitchen OVER THE COUNTER MEDICATION Magnesium with chelate Zinc 1 deaily    . OVER THE COUNTER MEDICATION Advanced Melatonin 1 daily    . rosuvastatin (CRESTOR) 20 MG tablet Take 1 tablet (20 mg total) by mouth daily. 90 tablet 2  . SUMAtriptan (IMITREX) 20 MG/ACT nasal spray USE ONE DOSE IN THE NOSE NOW MAY REPEAT IN 2 HOURS, MAX 2 DOSES IN 24 HOURS 12 Inhaler 0  . topiramate (TOPAMAX) 100 MG tablet Take 1 tablet 2 x day for Headache Prevention 60 tablet 99   No current facility-administered  medications on file prior to visit.    Review of Systems  Constitutional: Positive for fatigue. Negative for fever and chills.  HENT: Positive for ear pain, postnasal drip, sinus pressure and sneezing. Negative for congestion, dental problem, drooling, ear discharge, facial swelling, hearing loss, mouth sores, nosebleeds, rhinorrhea, sore throat, tinnitus and trouble swallowing.   Eyes: Negative.  Negative for visual disturbance.  Respiratory: Positive for cough. Negative for apnea, choking, chest tightness, shortness of breath, wheezing and stridor.   Cardiovascular: Negative.   Genitourinary: Negative.   Neurological: Positive for headaches. Negative for dizziness, tremors, seizures, syncope, facial asymmetry, speech difficulty, weakness, light-headedness and numbness.       Objective:   Physical Exam  Constitutional: She is oriented to person, place, and time. She appears well-developed and well-nourished.  HENT:  Right Ear: Hearing and external ear normal. No mastoid tenderness. Tympanic membrane is injected. Tympanic membrane is not perforated, not erythematous, not retracted and not bulging. A middle ear effusion is present.  Left Ear: Hearing and external ear normal. No mastoid tenderness. Tympanic membrane is injected. Tympanic membrane is not perforated, not erythematous, not retracted and not bulging. A middle ear effusion is present.  Nose: Right sinus exhibits maxillary sinus tenderness. Left sinus exhibits maxillary sinus  tenderness.  Mouth/Throat: Uvula is midline, oropharynx is clear and moist and mucous membranes are normal.  Eyes: Conjunctivae and EOM are normal. Pupils are equal, round, and reactive to light.  Neck: Neck supple.  Cardiovascular: Normal rate and regular rhythm.   Pulmonary/Chest: Effort normal and breath sounds normal. No respiratory distress. She has no wheezes.  Abdominal: Soft. Bowel sounds are normal.  Musculoskeletal: Normal range of motion.   Lymphadenopathy:    She has no cervical adenopathy.  Neurological: She is alert and oriented to person, place, and time.  Skin: Skin is warm and dry.       Assessment & Plan:  Allergies/inflammation- no fever, chills, suggest allergy pill, prednisone, if not better call office Monday for ABX

## 2015-09-27 ENCOUNTER — Ambulatory Visit: Payer: Self-pay | Admitting: Internal Medicine

## 2015-10-06 ENCOUNTER — Encounter: Payer: Self-pay | Admitting: Internal Medicine

## 2015-10-06 ENCOUNTER — Ambulatory Visit (INDEPENDENT_AMBULATORY_CARE_PROVIDER_SITE_OTHER): Payer: Medicare Other | Admitting: Internal Medicine

## 2015-10-06 VITALS — BP 108/64 | HR 82 | Temp 98.0°F | Resp 16 | Ht 66.5 in | Wt 148.0 lb

## 2015-10-06 DIAGNOSIS — R300 Dysuria: Secondary | ICD-10-CM

## 2015-10-06 MED ORDER — CIPROFLOXACIN HCL 500 MG PO TABS: 500.0000 mg | ORAL_TABLET | Freq: Two times a day (BID) | ORAL | Status: AC

## 2015-10-06 NOTE — Progress Notes (Signed)
   Subjective:    Patient ID: Deborah [name], female    DOB: Feb 27, 1946, 70 y.o.   MRN: [id number]  Dysuria  Associated symptoms include frequency, hematuria and urgency. Pertinent negatives include no chills, nausea or vomiting.  Patient presents to the office for evaluation of dysuria since Monday.  She reports that she was having some burning and pain with urination.  She notes that she has pain with every urination.  She has tried azo over the counter.  She notes some suprapubic pain with the urination.      Review of Systems  Constitutional: Negative for fever and chills.  Gastrointestinal: Positive for abdominal pain. Negative for nausea and vomiting.  Genitourinary: Positive for dysuria, urgency, frequency and hematuria. Negative for vaginal bleeding, vaginal discharge and vaginal pain.       Objective:   Physical Exam  Constitutional: She is oriented to person, place, and time. She appears well-developed and well-nourished. No distress.  HENT:  Head: Normocephalic.  Mouth/Throat: Oropharynx is clear and moist. No oropharyngeal exudate.  Eyes: Conjunctivae are normal. No scleral icterus.  Neck: Normal range of motion. Neck supple. No JVD present. No thyromegaly present.  Cardiovascular: Normal rate, regular rhythm, normal heart sounds and intact distal pulses.  Exam reveals no gallop and no friction rub.   No murmur heard. Pulmonary/Chest: Effort normal and breath sounds normal. No respiratory distress. She has no wheezes. She has no rales. She exhibits no tenderness.  Abdominal: Soft. Bowel sounds are normal. She exhibits no distension and no mass. There is no tenderness. There is no rebound and no guarding.  Musculoskeletal: Normal range of motion.  Lymphadenopathy:    She has no cervical adenopathy.  Neurological: She is alert and oriented to person, place, and time.  Skin: Skin is warm and dry. She is not diaphoretic.  Psychiatric: She has a normal mood and affect. Her  behavior is normal. Judgment and thought content normal.  Nursing note and vitals reviewed.   Filed Vitals:   10/06/15 1529  BP: 108/64  Pulse: 82  Temp: 98 F (36.7 C)  Resp: 16         Assessment & Plan:    1. Dysuria -cipro -recheck in a month - Urinalysis, Routine w reflex microscopic (not at [facility]) - Culture, Urine

## 2015-10-07 LAB — URINALYSIS, MICROSCOPIC ONLY
CASTS: NONE SEEN [LPF]
Crystals: NONE SEEN [HPF]
SQUAMOUS EPITHELIAL / LPF: NONE SEEN [HPF] (ref <=5)
YEAST: NONE SEEN [HPF]

## 2015-10-07 LAB — URINALYSIS, ROUTINE W REFLEX MICROSCOPIC
BILIRUBIN URINE: NEGATIVE
Glucose, UA: NEGATIVE
KETONES UR: NEGATIVE
NITRITE: POSITIVE — AB
PROTEIN: NEGATIVE
SPECIFIC GRAVITY, URINE: 1.009 (ref 1.001–1.035)
pH: 6 (ref 5.0–8.0)

## 2015-10-08 LAB — URINE CULTURE

## 2015-11-08 ENCOUNTER — Ambulatory Visit (INDEPENDENT_AMBULATORY_CARE_PROVIDER_SITE_OTHER): Payer: Medicare Other | Admitting: Internal Medicine

## 2015-11-08 ENCOUNTER — Encounter: Payer: Self-pay | Admitting: Internal Medicine

## 2015-11-08 VITALS — BP 106/62 | HR 90 | Temp 98.2°F | Resp 16 | Ht 66.5 in | Wt 150.0 lb

## 2015-11-08 DIAGNOSIS — N3000 Acute cystitis without hematuria: Secondary | ICD-10-CM | POA: Diagnosis not present

## 2015-11-08 MED ORDER — OMEPRAZOLE 20 MG PO CPDR: 20.0000 mg | DELAYED_RELEASE_CAPSULE | Freq: Every day | ORAL | 0 refills | Status: DC

## 2015-11-08 MED ORDER — ONDANSETRON 8 MG PO TBDP: ORAL_TABLET | ORAL | 0 refills | Status: DC

## 2015-11-08 NOTE — Progress Notes (Signed)
Patient ID: Deborah [name], female   DOB: [birth year], 70 y.o.   MRN: [id number]  Assessment and Plan:   1. Acute cystitis without hematuria -finished cipro -recheck urine culture -recheck ua  HPI 70 y.o.female presents for 1 month follow up of UTI with Klebsiella as main bacteria.  She did take her cipro until it was gone. Patient reports that they have been doing well.  female did take her medication.  They are not having difficulty with their medications.  They report no adverse reactions. She notes that she hasn't had any issues since finishing abx with frequency, urgency, vomiting, dysuria.    She does report some nausea which has been going on for a while.  She reports that it is better than it was.  She reports that she does get it worse after eating. She does not get abdominal pain.  She reports no water brash or reflux.  f  Past Medical History:  Diagnosis Date  . Allergy   . Anemia   . Anxiety   . Arthritis   . Depression   . Hyperlipidemia   . Hypertension   . Migraines   . Prediabetes   . RLS (restless legs syndrome)   . Vitamin D deficiency      Allergies  Allergen Reactions  . Banana     Nausea and vomiting  . Effexor [Venlafaxine]     insomnia  . Maxalt [Rizatriptan Benzoate] Other (See Comments)    Nausea and excessive salivation  . Tussin [Guaifenesin]     rash  . Zostavax [Zoster Vaccine Live]       Current Outpatient Prescriptions on File Prior to Visit  Medication Sig Dispense Refill  . ALPRAZolam (XANAX) 1 MG tablet TAKE ONE-HALF TO ONE TABLET BY MOUTH TWICE DAILY TO THREE TIMES DAILY AS NEEDED FOR ANXIETY 90 tablet 0  . aspirin 81 MG tablet Take 81 mg by mouth daily.    . B Complex Vitamins (VITAMIN-B COMPLEX PO) Take 1 capsule by mouth daily.    Marland Kitchen BIOTIN PO Take by mouth. Patient takes 10000 mcg plus Keratin 1 daily    . Cholecalciferol (VITAMIN D PO) Take 5,000 Units by mouth daily.    . fluticasone (FLONASE) 50 MCG/ACT nasal spray Place 2  sprays into both nostrils at bedtime. 16 g 1  . Multiple Vitamin (MULTIVITAMIN) tablet Take 1 tablet by mouth daily.    Marland Kitchen OVER THE COUNTER MEDICATION Magnesium with chelate Zinc 1 deaily    . OVER THE COUNTER MEDICATION Advanced Melatonin 1 daily    . rosuvastatin (CRESTOR) 20 MG tablet Take 1 tablet (20 mg total) by mouth daily. 90 tablet 2  . SUMAtriptan (IMITREX) 20 MG/ACT nasal spray USE ONE DOSE IN THE NOSE NOW MAY REPEAT IN 2 HOURS, MAX 2 DOSES IN 24 HOURS 12 Inhaler 0  . topiramate (TOPAMAX) 100 MG tablet Take 1 tablet 2 x day for Headache Prevention 60 tablet 99   No current facility-administered medications on file prior to visit.     ROS: all negative except above.   Physical Exam: There were no vitals filed for this visit. Ht 5' 6.5" (1.689 m)  General Appearance: Well developed well nourished, non-toxic appearing in no apparent distress. Eyes: PERRLA, EOMs, conjunctiva w/ no swelling or erythema or discharge Sinuses: No Frontal/maxillary tenderness ENT/Mouth: Ear canals clear without swelling or erythema.  TM's normal bilaterally with no retractions, bulging, or loss of landmarks.   Neck: Supple, thyroid normal, no notable JVD  Respiratory: Respiratory effort normal, Clear breath sounds anteriorly and posteriorly bilaterally without rales, rhonchi, wheezing or stridor. No retractions or accessory muscle usage. Cardio: RRR with no MRGs.   Abdomen: Soft, + BS.  Non tender, no guarding, rebound, hernias, masses.  Musculoskeletal: Full ROM, 5/5 strength, normal gait.  Skin: Warm, dry without rashes  Neuro: Awake and oriented X 3, Cranial nerves intact. Normal muscle tone, no cerebellar symptoms. Sensation intact.  Psych: normal affect, Insight and Judgment appropriate.     Starlyn Skeans, PA-C 11:29 AM [facility]

## 2015-11-09 LAB — URINALYSIS, ROUTINE W REFLEX MICROSCOPIC
BILIRUBIN URINE: NEGATIVE
Glucose, UA: NEGATIVE
HGB URINE DIPSTICK: NEGATIVE
KETONES UR: NEGATIVE
Leukocytes, UA: NEGATIVE
Nitrite: NEGATIVE
PH: 7 (ref 5.0–8.0)
Protein, ur: NEGATIVE
Specific Gravity, Urine: 1.011 (ref 1.001–1.035)

## 2015-11-10 LAB — URINE CULTURE: ORGANISM ID, BACTERIA: NO GROWTH

## 2015-11-30 ENCOUNTER — Encounter: Payer: Self-pay | Admitting: Internal Medicine

## 2015-11-30 ENCOUNTER — Ambulatory Visit (INDEPENDENT_AMBULATORY_CARE_PROVIDER_SITE_OTHER): Payer: Medicare Other | Admitting: Internal Medicine

## 2015-11-30 VITALS — BP 116/68 | HR 72 | Temp 97.3°F | Resp 16 | Ht 66.5 in | Wt 149.2 lb

## 2015-11-30 DIAGNOSIS — R110 Nausea: Secondary | ICD-10-CM | POA: Diagnosis not present

## 2015-11-30 DIAGNOSIS — R1011 Right upper quadrant pain: Secondary | ICD-10-CM | POA: Diagnosis not present

## 2015-11-30 LAB — CBC WITH DIFFERENTIAL/PLATELET
BASOS PCT: 1 %
Basophils Absolute: 60 cells/uL (ref 0–200)
EOS ABS: 180 {cells}/uL (ref 15–500)
EOS PCT: 3 %
HCT: 38.8 % (ref 35.0–45.0)
HEMOGLOBIN: 12.7 g/dL (ref 11.7–15.5)
Lymphs Abs: 1920 cells/uL (ref 850–3900)
MCH: 30 pg (ref 27.0–33.0)
MCHC: 32.7 g/dL (ref 32.0–36.0)
MCV: 91.7 fL (ref 80.0–100.0)
MPV: 9.6 fL (ref 7.5–12.5)
Monocytes Absolute: 540 cells/uL (ref 200–950)
Monocytes Relative: 9 %
NEUTROS ABS: 3300 {cells}/uL (ref 1500–7800)
Neutrophils Relative %: 55 %
Platelets: 276 10*3/uL (ref 140–400)
RBC: 4.23 MIL/uL (ref 3.80–5.10)
RDW: 13.6 % (ref 11.0–15.0)
WBC: 6 10*3/uL (ref 3.8–10.8)

## 2015-11-30 LAB — HEPATIC FUNCTION PANEL
ALK PHOS: 69 U/L (ref 33–130)
ALT: 9 U/L (ref 6–29)
AST: 17 U/L (ref 10–35)
Albumin: 4.3 g/dL (ref 3.6–5.1)
BILIRUBIN DIRECT: 0.1 mg/dL (ref <=0.2)
BILIRUBIN INDIRECT: 0.3 mg/dL (ref 0.2–1.2)
BILIRUBIN TOTAL: 0.4 mg/dL (ref 0.2–1.2)
Total Protein: 6.6 g/dL (ref 6.1–8.1)

## 2015-11-30 LAB — AMYLASE: Amylase: 56 U/L (ref 0–105)

## 2015-11-30 MED ORDER — RANITIDINE HCL 300 MG PO TABS: ORAL_TABLET | ORAL | 1 refills | Status: DC

## 2015-11-30 MED ORDER — PANTOPRAZOLE SODIUM 40 MG PO TBEC: DELAYED_RELEASE_TABLET | ORAL | 1 refills | Status: DC

## 2015-11-30 NOTE — Progress Notes (Signed)
Subjective:    Patient ID: Deborah [name], female    DOB: 1945-10-23, 70 y.o.   MRN: [id number]  HPI  Patient is a very nice 70 yo MWF presenting with intermittent nausea w/o emesis unrelated to food ingestion. She denies any apparent identified triggers. She denies any HB, water brash or reflux sx's.  Medication Sig  . ALPRAZolam (XANAX) 1 MG tablet TAKE ONE-HALF TO ONE TABLET BY MOUTH TWICE DAILY TO THREE TIMES DAILY AS NEEDED FOR ANXIETY  . aspirin 81 MG tablet Take 81 mg by mouth daily.  . B Complex Vitamins (VITAMIN-B COMPLEX PO) Take 1 capsule by mouth daily.  Marland Kitchen BIOTIN PO Take by mouth. Patient takes 10000 mcg plus Keratin 1 daily  . Cholecalciferol (VITAMIN D PO) Take 5,000 Units by mouth daily.  . fluticasone (FLONASE) 50 MCG/ACT nasal spray Place 2 sprays into both nostrils at bedtime.  . Multiple Vitamin (MULTIVITAMIN) tablet Take 1 tablet by mouth daily.  . ondansetron (ZOFRAN ODT) 8 MG disintegrating tablet 8mg  ODT q4 hours prn nausea  . OVER THE COUNTER MEDICATION Magnesium with chelate Zinc 1 deaily  . OVER THE COUNTER MEDICATION Advanced Melatonin 1 daily  . rosuvastatin (CRESTOR) 20 MG tablet Take 1 tablet (20 mg total) by mouth daily.  . SUMAtriptan (IMITREX) 20 MG/ACT nasal spray USE ONE DOSE IN THE NOSE NOW MAY REPEAT IN 2 HOURS, MAX 2 DOSES IN 24 HOURS  . topiramate (TOPAMAX) 100 MG tablet Take 1 tablet 2 x day for Headache Prevention  . omeprazole (PRILOSEC) 20 MG capsule Take 1 capsule (20 mg total) by mouth daily.   Allergies  Allergen Reactions  . Banana     Nausea and vomiting  . Effexor [Venlafaxine]     insomnia  . Maxalt [Rizatriptan Benzoate] Other (See Comments)    Nausea and excessive salivation  . Tussin [Guaifenesin]     rash  . Zostavax [Zoster Vaccine Live]    Past Medical History:  Diagnosis Date  . Allergy   . Anemia   . Anxiety   . Arthritis   . Depression   . Hyperlipidemia   . Hypertension   . Migraines   . Prediabetes   . RLS  (restless legs syndrome)   . Vitamin D deficiency    Past Surgical History:  Procedure Laterality Date  . KNEE ARTHROSCOPY Bilateral 04/2014   [facility]  . MOHS SURGERY Right 04/2013   Squamous cell cacinoma  . SPINE SURGERY  2010   C6-C7 fusion   Review of Systems  10 point systems review negative except as above.    Objective:   Physical Exam  BP 116/68   Pulse 72   Temp 97.3 F (36.3 C)   Resp 16   Ht 5' 6.5" (1.689 m)   Wt 149 lb 3.2 oz (67.7 kg)   BMI 23.72 kg/m   HEENT - Eac's patent. TM's Nl. EOM's full. PERRLA. NasoOroPharynx clear. Neck - supple. Nl Thyroid. Carotids 2+ & No bruits, nodes, JVD Chest - Clear equal BS w/o Rales, rhonchi, wheezes. Cor - Nl HS. RRR w/o sig MGR. PP 1(+). No edema. Abd - (+) RUQ point tenderness. No masses. BS nl. MS- FROM w/o deformities. Muscle power, tone and bulk Nl. Gait Nl. Neuro - Nl w/o focal abnormalities.    Assessment & Plan:   1. RUQ pain  - Amylase - CBC with Differential/Platelet - Hepatic function panel - pantoprazole (PROTONIX) 40 MG tablet; Take 1 tablet every morning  Dispense:  30 tablet; Refill: 1 - ranitidine (ZANTAC) 300 MG tablet; Take 1 tablet at bedtime for indigestion  Dispense: 30 tablet; Refill: 1 - US Abdomen Limited RUQ  2. Nausea without vomiting, ? Occult UGI Dz  - Amylase - CBC with Differential/Platelet - Hepatic function panel - pantoprazole (PROTONIX) 40 MG tablet; Take 1 tablet every morning  Dispense: 30 tablet; Refill: 1 - ranitidine (ZANTAC) 300 MG tablet; Take 1 tablet at bedtime for indigestion  Dispense: 30 tablet; Refill: 1 - US Abdomen Limited RUQ; Future  -  Diet discussed

## 2015-11-30 NOTE — Patient Instructions (Addendum)
Cholecystitis Cholecystitis is inflammation of the gallbladder. It is often called a gallbladder attack. The gallbladder is a pear-shaped organ that lies beneath the liver on the right side of the body. The gallbladder stores bile, which is a fluid that helps the body to digest fats. If bile builds up in your gallbladder, your gallbladder becomes inflamed. This condition may occur suddenly (be acute). Repeat episodes of acute cholecystitis or prolonged episodes may lead to a long-term (chronic) condition. Cholecystitis is serious and it requires treatment.  CAUSES The most common cause of this condition is gallstones. Gallstones can block the tube (duct) that carries bile out of your gallbladder. This causes bile to build up. Other causes of this condition include:  Damage to the gallbladder due to a decrease in blood flow.  Infections in the bile ducts.  Scars or kinks in the bile ducts.  Tumors in the liver, pancreas, or gallbladder. RISK FACTORS This condition is more likely to develop in:  People who have sickle cell disease.  People who take birth control pills or use estrogen.  People who have alcoholic liver disease.  People who have liver cirrhosis.  People who have their nutrition delivered through a vein (parenteral nutrition).  People who do not eat or drink (do fasting) for a long period of time.  People who are obese.  People who have rapid weight loss.  People who are pregnant.  People who have increased triglyceride levels.  People who have pancreatitis. SYMPTOMS Symptoms of this condition include:  Abdominal pain, especially in the upper right area of the abdomen.  Abdominal tenderness or bloating.  Nausea.  Vomiting.  Fever.  Chills.  Yellowing of the skin and the whites of the eyes (jaundice). DIAGNOSIS This condition is diagnosed with a medical history and physical exam. You may also have other tests, including:  Imaging tests, such as:  An  ultrasound of the gallbladder.  A CT scan of the abdomen.  A gallbladder nuclear scan (HIDA scan). This scan allows your health care provider to see the bile moving from your liver to your gallbladder and to your small intestine.  MRI.  Blood tests, such as:  A complete blood count, because the white blood cell count may be higher than normal.  Liver function tests, because some levels may be higher than normal with certain types of gallstones. TREATMENT Treatment may include:  Fasting for a certain amount of time.  IV fluids.  Medicine to treat pain or vomiting.  Antibiotic medicine.  Surgery to remove your gallbladder (cholecystectomy). This may happen immediately or at a later time. Dana care will depend on your treatment. In general:  Take over-the-counter and prescription medicines only as told by your health care provider.  If you were prescribed an antibiotic medicine, take it as told by your health care provider. Do not stop taking the antibiotic even if you start to feel better.  Follow instructions from your health care provider about what to eat or drink. When you are allowed to eat, avoid eating or drinking anything that triggers your symptoms.  Keep all follow-up visits as told by your health care provider. This is important. SEEK MEDICAL CARE IF:  Your pain is not controlled with medicine.  You have a fever. SEEK IMMEDIATE MEDICAL CARE IF:  Your pain moves to another part of your abdomen or to your back.  You continue to have symptoms or you develop new symptoms even with treatment.    +++++++++++++++++++++++++++  Low-Fat Diet for  Gallbladder Conditions A low-fat diet can be helpful if you have  a gallbladder condition. With these conditions, your pancreas and gallbladder have trouble digesting fats. A healthy eating plan with less fat will help rest your pancreas and gallbladder and reduce your symptoms. WHAT DO I NEED TO KNOW  ABOUT THIS DIET?  Eat a low-fat diet.  Reduce your fat intake to less than 20-30% of your total daily calories. This is less than 50-60 g of fat per day.  Remember that you need some fat in your diet. Ask your dietician what your daily goal should be.  Choose nonfat and low-fat healthy foods. Look for the words "nonfat," "low fat," or "fat free."  As a guide, look on the label and choose foods with less than 3 g of fat per serving. Eat only one serving.  Avoid alcohol.  Do not smoke. If you need help quitting, talk with your health care provider.  Eat small frequent meals instead of three large heavy meals. WHAT FOODS CAN I EAT? Grains Include healthy grains and starches such as potatoes, wheat bread, fiber-rich cereal, and brown rice. Choose whole grain options whenever possible. In adults, whole grains should account for 45-65% of your daily calories.  Fruits and Vegetables Eat plenty of fruits and vegetables. Fresh fruits and vegetables add fiber to your diet. Meats and Other Protein Sources Eat lean meat such as chicken and pork. Trim any fat off of meat before cooking it. Eggs, fish, and beans are other sources of protein. In adults, these foods should account for 10-35% of your daily calories. Dairy Choose low-fat milk and dairy options. Dairy includes fat and protein, as well as calcium.  Fats and Oils Limit high-fat foods such as fried foods, sweets, baked goods, sugary drinks.  Other Creamy sauces and condiments, such as mayonnaise, can add extra fat. Think about whether or not you need to use them, or use smaller amounts or low fat options. WHAT FOODS ARE NOT RECOMMENDED?  High fat foods, such as:  [employer].  Ice cream.  [place] toast.  Sweet rolls.  Pizza.  Cheese bread.  Foods covered with batter, butter, creamy sauces, or cheese.  Fried foods.  Sugary drinks and desserts.  Foods that cause gas or bloating    ++++++++++++++++++++++++++++++++++++++++ Food Choices for Gastroesophageal Reflux Disease, Adult When you have gastroesophageal reflux disease (GERD), the foods you eat and your eating habits are very important. Choosing the right foods can help ease the discomfort of GERD. WHAT GENERAL GUIDELINES DO I NEED TO FOLLOW?  Choose fruits, vegetables, whole grains, low-fat dairy products, and low-fat meat, fish, and poultry.  Limit fats such as oils, salad dressings, butter, nuts, and avocado.  Keep a food diary to identify foods that cause symptoms.  Avoid foods that cause reflux. These may be different for different people.  Eat frequent small meals instead of three large meals each day.  Eat your meals slowly, in a relaxed setting.  Limit fried foods.  Cook foods using methods other than frying.  Avoid drinking alcohol.  Avoid drinking large amounts of liquids with your meals.  Avoid bending over or lying down until 2-3 hours after eating. WHAT FOODS ARE NOT RECOMMENDED? The following are some foods and drinks that may worsen your symptoms: Vegetables Tomatoes. Tomato juice. Tomato and spaghetti sauce. Chili peppers. Onion and garlic. Horseradish. Fruits Oranges, grapefruit, and lemon (fruit and juice). Meats High-fat meats, fish, and poultry. This includes hot dogs,  ribs, ham, sausage, salami, and bacon. Dairy Whole milk and chocolate milk. Sour cream. Cream. Butter. Ice cream. Cream cheese.  Beverages Coffee and tea, with or without caffeine. Carbonated beverages or energy drinks. Condiments Hot sauce. Barbecue sauce.  Sweets/Desserts Chocolate and cocoa. Donuts. Peppermint and spearmint. Fats and Oils High-fat foods, including [place] fries and potato chips. Other Vinegar. Strong spices, such as black pepper, white pepper, red pepper, cayenne, curry powder, cloves, ginger, and chili powder. The items listed above may not be a complete list of foods and beverages to  avoid. Contact your dietitian for more information.

## 2015-12-03 ENCOUNTER — Ambulatory Visit
Admission: RE | Admit: 2015-12-03 | Discharge: 2015-12-03 | Disposition: A | Discharge status: Home / Self Care | Payer: Medicare Other | Source: Ambulatory Visit | Attending: Internal Medicine | Admitting: Internal Medicine

## 2015-12-03 DIAGNOSIS — R110 Nausea: Secondary | ICD-10-CM

## 2015-12-03 DIAGNOSIS — R1011 Right upper quadrant pain: Secondary | ICD-10-CM

## 2015-12-13 ENCOUNTER — Encounter: Payer: Self-pay | Admitting: Internal Medicine

## 2015-12-13 ENCOUNTER — Ambulatory Visit (INDEPENDENT_AMBULATORY_CARE_PROVIDER_SITE_OTHER): Payer: Medicare Other | Admitting: Internal Medicine

## 2015-12-13 VITALS — BP 110/68 | HR 56 | Temp 97.7°F | Resp 16 | Ht 66.5 in | Wt 147.6 lb

## 2015-12-13 DIAGNOSIS — R110 Nausea: Secondary | ICD-10-CM | POA: Diagnosis not present

## 2015-12-13 DIAGNOSIS — R1011 Right upper quadrant pain: Secondary | ICD-10-CM

## 2015-12-13 DIAGNOSIS — Z23 Encounter for immunization: Secondary | ICD-10-CM

## 2015-12-13 NOTE — Progress Notes (Signed)
[facility]   Unk Pinto, M.D.    [name]. [name], P.A.-C      Starlyn Skeans, P.A.-C   [facility]                [address] [place] [id number] Telephone [phone] Telefax [phone] Subjective:    Patient ID: Deborah [name], female    DOB: [birth year], 70 y.o.   MRN: [id number]  HPI  This very nice 70 yo MWF was seen ~ 10 days ago with c/o Intermittent and query dyspepsia and her anti-dyspepsia regimen was intensified with Pantoprazole in the am & Ranitidine in the evening. Lab screening was WNL and Abd U/S was unremarkable. Her sx's persit and she returnss. She self d/c'd her Lamonte Sakai meds (Topomax) 2 days ago and also reports she's off of her Zofran. Denies fevers, chill, sweats, pruritis, rashes, emesis or diarrhea, but does endorse constipation requiring a daily laxative. Hx/o colonoscopy in 2008 with Dr Earlean Shawl.  Medication Sig  . ALPRAZolam (XANAX) 1 MG tablet TAKE ONE-HALF TO ONE TABLET BY MOUTH TWICE DAILY TO THREE TIMES DAILY AS NEEDED FOR ANXIETY  . aspirin 81 MG tablet Take 81 mg by mouth daily.  . B Complex Vitamins (VITAMIN-B COMPLEX PO) Take 1 capsule by mouth daily.  Marland Kitchen BIOTIN PO Take by mouth. Patient takes 10000 mcg plus Keratin 1 daily  . Cholecalciferol (VITAMIN D PO) Take 5,000 Units by mouth daily.  . fluticasone (FLONASE) 50 MCG/ACT nasal spray Place 2 sprays into both nostrils at bedtime.  . Multiple Vitamin (MULTIVITAMIN) tablet Take 1 tablet by mouth daily.  . ondansetron (ZOFRAN ODT) 8 MG disintegrating tablet 8mg  ODT q4 hours prn nausea  . OVER THE COUNTER MEDICATION Magnesium with chelate Zinc 1 deaily  . OVER THE COUNTER MEDICATION Advanced Melatonin 1 daily  . pantoprazole (PROTONIX) 40 MG tablet Take 1 tablet every morning  . ranitidine (ZANTAC) 300 MG tablet Take 1 tablet at bedtime for indigestion  . rosuvastatin (CRESTOR) 20 MG tablet Take 1 tablet (20 mg  total) by mouth daily.  . SUMAtriptan (IMITREX) 20 MG/ACT nasal spray USE ONE DOSE IN THE NOSE NOW MAY REPEAT IN 2 HOURS, MAX 2 DOSES IN 24 HOURS  . topiramate (TOPAMAX) 100 MG tablet Take 1 tablet 2 x day for Headache Prevention   No facility-administered medications prior to visit.    Allergies  Allergen Reactions  . Banana     Nausea and vomiting  . Effexor [Venlafaxine]     insomnia  . Maxalt [Rizatriptan Benzoate] Other (See Comments)    Nausea and excessive salivation  . Tussin [Guaifenesin]     rash  . Zostavax [Zoster Vaccine Live]    Past Medical History:  Diagnosis Date  . Allergy   . Anemia   . Anxiety   . Arthritis   . Depression   . Hyperlipidemia   . Hypertension   . Migraines   . Prediabetes   . RLS (restless legs syndrome)   . Vitamin D deficiency    Past Surgical History:  Procedure Laterality Date  . KNEE ARTHROSCOPY Bilateral 04/2014   [facility]  . MOHS SURGERY Right 04/2013   Squamous cell cacinoma  . SPINE SURGERY  2010   C6-C7 fusion   Review of Systems 10 point systems review negative except as above.    Objective:  Physical Exam  BP 110/68   Pulse (!) 56   Temp 97.7 F (36.5 C)   Resp 16   Ht 5' 6.5" (1.689 m)   Wt 147 lb 9.6 oz (67 kg)   BMI 23.47 kg/m   Skin - Clear w/o rash, icterus.  HEENT - WNL Neck - supple. Nl Thyroid. Carotids 2+ & No bruits, nodes, JVD Chest - Clear equal BS w/o Rales, rhonchi, wheezes. Cor - Nl HS. RRR w/o sig M. Abd - Soft with RUQ point tenderness & mild guarding & also EG tenderness w/o RB.  BS nl. MS- FROM w/o deformities. Muscle power, tone and bulk Nl. Gait Nl. Neuro - Nl w/o focal abnormalities.    Assessment & Plan:   1. Nausea without vomiting  - NM Hepato W/Eject Fract; Future  2. RUQ pain  - NM Hepato W/Eject Fract; Future  3. Need for prophylactic vaccination and inoculation against influenza  - Flu vaccine HIGH DOSE PF (Fluzone High dose)  - disposition pending labs.

## 2015-12-31 ENCOUNTER — Ambulatory Visit (HOSPITAL_COMMUNITY)
Admission: RE | Admit: 2015-12-31 | Discharge: 2015-12-31 | Disposition: A | Discharge status: Home / Self Care | Payer: Medicare Other | Source: Ambulatory Visit | Attending: Internal Medicine | Admitting: Internal Medicine

## 2015-12-31 DIAGNOSIS — R110 Nausea: Secondary | ICD-10-CM | POA: Diagnosis not present

## 2015-12-31 DIAGNOSIS — R1011 Right upper quadrant pain: Secondary | ICD-10-CM | POA: Diagnosis present

## 2015-12-31 MED ORDER — TECHNETIUM TC 99M MEBROFENIN IV KIT
5.3000 | PACK | Freq: Once | INTRAVENOUS | Status: AC | PRN
  Administered 2015-12-31: 5.3 via INTRAVENOUS

## 2016-01-04 ENCOUNTER — Encounter: Payer: Self-pay | Admitting: Internal Medicine

## 2016-01-04 ENCOUNTER — Ambulatory Visit (INDEPENDENT_AMBULATORY_CARE_PROVIDER_SITE_OTHER): Payer: Medicare Other | Admitting: Internal Medicine

## 2016-01-04 VITALS — BP 124/78 | HR 72 | Temp 97.3°F | Resp 16 | Ht 66.5 in | Wt 150.0 lb

## 2016-01-04 DIAGNOSIS — Z79899 Other long term (current) drug therapy: Secondary | ICD-10-CM

## 2016-01-04 DIAGNOSIS — E785 Hyperlipidemia, unspecified: Secondary | ICD-10-CM | POA: Diagnosis not present

## 2016-01-04 DIAGNOSIS — R110 Nausea: Secondary | ICD-10-CM | POA: Diagnosis not present

## 2016-01-04 DIAGNOSIS — G43009 Migraine without aura, not intractable, without status migrainosus: Secondary | ICD-10-CM

## 2016-01-04 DIAGNOSIS — I10 Essential (primary) hypertension: Secondary | ICD-10-CM | POA: Diagnosis not present

## 2016-01-04 DIAGNOSIS — E559 Vitamin D deficiency, unspecified: Secondary | ICD-10-CM | POA: Diagnosis not present

## 2016-01-04 DIAGNOSIS — R7303 Prediabetes: Secondary | ICD-10-CM | POA: Diagnosis not present

## 2016-01-04 LAB — HEPATIC FUNCTION PANEL
ALT: 8 U/L (ref 6–29)
AST: 18 U/L (ref 10–35)
Albumin: 4 g/dL (ref 3.6–5.1)
Alkaline Phosphatase: 64 U/L (ref 33–130)
BILIRUBIN DIRECT: 0.1 mg/dL (ref <=0.2)
BILIRUBIN INDIRECT: 0.3 mg/dL (ref 0.2–1.2)
BILIRUBIN TOTAL: 0.4 mg/dL (ref 0.2–1.2)
Total Protein: 6.4 g/dL (ref 6.1–8.1)

## 2016-01-04 LAB — LIPID PANEL
CHOL/HDL RATIO: 3.7 ratio (ref <=5.0)
Cholesterol: 246 mg/dL — ABNORMAL HIGH (ref 125–200)
HDL: 67 mg/dL (ref >=46)
LDL Cholesterol: 157 mg/dL — ABNORMAL HIGH (ref <130)
Triglycerides: 110 mg/dL (ref <150)
VLDL: 22 mg/dL (ref <30)

## 2016-01-04 LAB — CBC WITH DIFFERENTIAL/PLATELET
BASOS PCT: 1 %
Basophils Absolute: 43 cells/uL (ref 0–200)
Eosinophils Absolute: 172 cells/uL (ref 15–500)
Eosinophils Relative: 4 %
HEMATOCRIT: 38 % (ref 35.0–45.0)
Hemoglobin: 12.6 g/dL (ref 11.7–15.5)
LYMPHS PCT: 40 %
Lymphs Abs: 1720 cells/uL (ref 850–3900)
MCH: 29.8 pg (ref 27.0–33.0)
MCHC: 33.2 g/dL (ref 32.0–36.0)
MCV: 89.8 fL (ref 80.0–100.0)
MONO ABS: 473 {cells}/uL (ref 200–950)
MONOS PCT: 11 %
MPV: 10 fL (ref 7.5–12.5)
NEUTROS PCT: 44 %
Neutro Abs: 1892 cells/uL (ref 1500–7800)
PLATELETS: 270 10*3/uL (ref 140–400)
RBC: 4.23 MIL/uL (ref 3.80–5.10)
RDW: 13.2 % (ref 11.0–15.0)
WBC: 4.3 10*3/uL (ref 3.8–10.8)

## 2016-01-04 LAB — BASIC METABOLIC PANEL WITH GFR
BUN: 9 mg/dL (ref 7–25)
CO2: 27 mmol/L (ref 20–31)
Calcium: 9.4 mg/dL (ref 8.6–10.4)
Chloride: 104 mmol/L (ref 98–110)
Creat: 0.9 mg/dL (ref 0.50–0.99)
GFR, EST NON AFRICAN AMERICAN: 65 mL/min (ref >=60)
GFR, Est African American: 75 mL/min (ref >=60)
GLUCOSE: 85 mg/dL (ref 65–99)
POTASSIUM: 4.3 mmol/L (ref 3.5–5.3)
Sodium: 139 mmol/L (ref 135–146)

## 2016-01-04 LAB — MAGNESIUM: MAGNESIUM: 2.3 mg/dL (ref 1.5–2.5)

## 2016-01-04 LAB — TSH: TSH: 2.65 m[IU]/L

## 2016-01-04 MED ORDER — TOPIRAMATE 100 MG PO TABS: ORAL_TABLET | ORAL | 1 refills | Status: DC

## 2016-01-04 NOTE — Patient Instructions (Signed)

## 2016-01-04 NOTE — Progress Notes (Signed)
[place] [facility] Unk Pinto, M.D.        Deborah [name]. Deborah [name], P.A.-C       Starlyn Skeans, P.A.-C  [facility]                [address] [place], [place] [id number] Telephone [phone] Telefax [phone] ______________________________________________________________________     This very nice 70 y.o. MWF presents for 6 month follow up with Hypertension, Hyperlipidemia, Pre-Diabetes and Vitamin D Deficiency. Patient also has has several visits recently for c/o intermittent nausea w/o emesis and labs have been unrevealing w/ nl CBNC, HFP and amylase. Also Abd U/S and Hida scans were negative & Nl and empiric trial with Pantoprazole & Ranitidine has failed to improve her nausea.      Patient is monitored expectantly with labile HTN since 2010 & BP has been controlled at home. Today's BP is  124/78. Patient has had no complaints of any cardiac type chest pain, palpitations, dyspnea/orthopnea/PND, dizziness, claudication, or dependent edema.     Hyperlipidemia has been controlled with diet & meds in the past, but today relates that she has been off of her Crestor recently. . Patient denies myalgias or other med SE's. Today's Lipids off Crestor are not at goal:  Lab Results  Component Value Date   CHOL 246 (H) 01/04/2016   HDL 67 01/04/2016   LDLCALC 157 (H) 01/04/2016   TRIG 110 01/04/2016   CHOLHDL 3.7 01/04/2016      Also, the patient has history of PreDiabetes (A1c 6.1%) since 2011 and has had no symptoms of reactive hypoglycemia, diabetic polys, paresthesias or visual blurring.  Last A1c was almost at goal & back to normal: Lab Results  Component Value Date   HGBA1C 5.7 (H) 06/17/2015      Further, the patient also has history of Vitamin D Deficiency in 2008 of "18" and supplements vitamin D without any suspected side-effects. Last vitamin D was at goal:  Lab Results  Component Value Date   VD25OH  103 (H) 03/15/2015   Current Outpatient Prescriptions on File Prior to Visit  Medication Sig  . Cholecalciferol (VITAMIN D PO) Take 5,000 Units by mouth daily.  . pantoprazole (PROTONIX) 40 MG tablet Take 1 tablet every morning  . ranitidine (ZANTAC) 300 MG tablet Take 1 tablet at bedtime for indigestion  . SUMAtriptan (IMITREX) 20 MG/ACT nasal spray USE ONE DOSE IN THE NOSE NOW MAY REPEAT IN 2 HOURS, MAX 2 DOSES IN 24 HOURS  . ALPRAZolam (XANAX) 1 MG tablet TAKE ONE-HALF TO ONE TABLET BY MOUTH TWICE DAILY TO THREE TIMES DAILY AS NEEDED FOR ANXIETY (Patient not taking: Reported on 01/04/2016)  . aspirin 81 MG tablet Take 81 mg by mouth daily.  . B Complex Vitamins (VITAMIN-B COMPLEX PO) Take 1 capsule by mouth daily.  Marland Kitchen BIOTIN PO Take by mouth. Patient takes 10000 mcg plus Keratin 1 daily  . fluticasone (FLONASE) 50 MCG/ACT nasal spray Place 2 sprays into both nostrils at bedtime. (Patient not taking: Reported on 01/04/2016)  . Multiple Vitamin (MULTIVITAMIN) tablet Take 1 tablet by mouth daily.  . ondansetron (ZOFRAN ODT) 8 MG disintegrating tablet 8mg  ODT q4 hours prn nausea (Patient not taking: Reported on 01/04/2016)  . OVER THE COUNTER MEDICATION Magnesium with chelate Zinc 1 deaily  . OVER THE COUNTER MEDICATION Advanced Melatonin 1 daily  . rosuvastatin (CRESTOR) 20 MG  tablet Take 1 tablet (20 mg total) by mouth daily. (Patient not taking: Reported on 01/04/2016)   Allergies  Allergen Reactions  . Banana     Nausea and vomiting  . Effexor [Venlafaxine]     insomnia  . Maxalt [Rizatriptan Benzoate] Other (See Comments)    Nausea and excessive salivation  . Tussin [Guaifenesin]     rash  . Zostavax [Zoster Vaccine Live]    PMHx:   Past Medical History:  Diagnosis Date  . Allergy   . Anemia   . Anxiety   . Arthritis   . Depression   . Hyperlipidemia   . Hypertension   . Migraines   . Prediabetes   . RLS (restless legs syndrome)   . Vitamin D deficiency    Immunization  History  Administered Date(s) Administered  . Influenza, High Dose Seasonal PF 01/01/2014, 03/15/2015, 12/13/2015  . Influenza-Unspecified 12/26/2012  . Pneumococcal Conjugate-13 08/06/2014  . Pneumococcal-Unspecified 12/26/2012  . Td 12/26/2012  . Zoster 12/08/2010   Past Surgical History:  Procedure Laterality Date  . KNEE ARTHROSCOPY Bilateral 04/2014   [facility]  . MOHS SURGERY Right 04/2013   Squamous cell cacinoma  . SPINE SURGERY  2010   C6-C7 fusion   FHx:    Reviewed / unchanged  SHx:    Reviewed / unchanged  Systems Review:  Constitutional: Denies fever, chills, wt changes, headaches, insomnia, fatigue, night sweats, change in appetite. Eyes: Denies redness, blurred vision, diplopia, discharge, itchy, watery eyes.  ENT: Denies discharge, congestion, post nasal drip, epistaxis, sore throat, earache, hearing loss, dental pain, tinnitus, vertigo, sinus pain, snoring.  CV: Denies chest pain, palpitations, irregular heartbeat, syncope, dyspnea, diaphoresis, orthopnea, PND, claudication or edema. Respiratory: denies cough, dyspnea, DOE, pleurisy, hoarseness, laryngitis, wheezing.  Gastrointestinal: Denies dysphagia, odynophagia, heartburn, reflux, water brash, abdominal pain or cramps,  vomiting, bloating, diarrhea, constipation, hematemesis, melena, hematochezia  or hemorrhoids. Genitourinary: Denies dysuria, frequency, urgency, nocturia, hesitancy, discharge, hematuria or flank pain. Musculoskeletal: Denies arthralgias, myalgias, stiffness, jt. swelling, pain, limping or strain/sprain.  Skin: Denies pruritus, rash, hives, warts, acne, eczema or change in skin lesion(s). Neuro: No weakness, tremor, incoordination, spasms, paresthesia or pain. Psychiatric: Denies confusion, memory loss or sensory loss. Endo: Denies change in weight, skin or hair change.  Heme/Lymph: No excessive bleeding, bruising or enlarged lymph nodes.  Physical Exam BP 124/78   Pulse 72   Temp  97.3 F (36.3 C)   Resp 16   Ht 5' 6.5" (1.689 m)   Wt 150 lb (68 kg)   BMI 23.85 kg/m   Appears well nourished and in no distress.  Eyes: PERRLA, EOMs, conjunctiva no swelling or erythema. Sinuses: No frontal/maxillary tenderness ENT/Mouth: EAC's clear, TM's nl w/o erythema, bulging. Nares clear w/o erythema, swelling, exudates. Oropharynx clear without erythema or exudates. Oral hygiene is good. Tongue normal, non obstructing. Hearing intact.  Neck: Supple. Thyroid nl. Car 2+/2+ without bruits, nodes or JVD. Chest: Respirations nl with BS clear & equal w/o rales, rhonchi, wheezing or stridor.  Cor: Heart sounds normal w/ regular rate and rhythm without sig. murmurs, gallops, clicks, or rubs. Peripheral pulses normal and equal  without edema.  Abdomen: Soft & bowel sounds normal. Non-tender w/o guarding, rebound, hernias, masses, or organomegaly.  Lymphatics: Unremarkable.  Musculoskeletal: Full ROM all peripheral extremities, joint stability, 5/5 strength, and normal gait.  Skin: Warm, dry without exposed rashes, lesions or ecchymosis apparent.  Neuro: Cranial nerves intact, reflexes equal bilaterally. Sensory-motor testing grossly intact. Tendon reflexes  grossly intact.  Pysch: Alert & oriented x 3.  Insight and judgement nl & appropriate. No ideations.  Assessment and Plan:  1. Essential hypertension  - Continue medication, monitor blood pressure at home. Continue DASH diet. Reminder to go to the ER if any CP, SOB, nausea, dizziness, severe HA, changes vision/speech, left arm numbness and tingling and jaw pain. - TSH  2. Hyperlipidemia  - Continue diet/meds, exercise,& lifestyle modifications. Continue monitor periodic cholesterol/liver & renal functions  - Lipid panel - TSH  3. Prediabetes  - Continue diet, exercise, lifestyle modifications. Monitor appropriate labs. - Hemoglobin A1c - Insulin, random  4. Vitamin D deficiency  - Continue supplementation. - VITAMIN D  25 Hydroxy   5. Migraine without aura and without status migrainosus, not intractable  - topiramate (TOPAMAX) 100 MG tablet; Take 1 tablet 2 x day for Headache Prevention  Dispense: 180 tablet; Refill: 1  6. Nausea without vomiting  - Ambulatory referral to Gastroenterology (Dr Earlean Shawl)   7. Medication management  - CBC with Differential/Platelet - BASIC METABOLIC PANEL WITH GFR - Hepatic function panel - Magnesium      Recommended regular exercise, BP monitoring, weight control, and discussed med and SE's. Recommended labs to assess and monitor clinical status. Further disposition pending results of labs. Over 30 minutes of exam, counseling, chart review was performed

## 2016-01-05 LAB — VITAMIN D 25 HYDROXY (VIT D DEFICIENCY, FRACTURES): VIT D 25 HYDROXY: 71 ng/mL (ref 30–100)

## 2016-01-05 LAB — HEMOGLOBIN A1C
Hgb A1c MFr Bld: 5.5 % (ref <5.7)
Mean Plasma Glucose: 111 mg/dL

## 2016-01-05 LAB — INSULIN, RANDOM: Insulin: 4.6 u[IU]/mL (ref 2.0–19.6)

## 2016-01-10 ENCOUNTER — Telehealth: Payer: Self-pay | Admitting: *Deleted

## 2016-01-10 ENCOUNTER — Other Ambulatory Visit: Payer: Self-pay | Admitting: Internal Medicine

## 2016-01-10 NOTE — Telephone Encounter (Signed)
Patient called and states she is feeling much better since she started taking her allergy 2 times a day.  She asked if the referral to Dr Earlean Shawl is necessary since the nausea is gone.  Per Dr Melford Aase, it is OK to cancel the referral for now. Dr [facility] office and the patient are aware.

## 2016-01-28 ENCOUNTER — Other Ambulatory Visit: Payer: Self-pay | Admitting: *Deleted

## 2016-01-28 ENCOUNTER — Other Ambulatory Visit: Payer: Self-pay | Admitting: Internal Medicine

## 2016-01-28 MED ORDER — ONDANSETRON HCL 8 MG PO TABS: ORAL_TABLET | ORAL | 1 refills | Status: DC

## 2016-01-28 MED ORDER — ONDANSETRON HCL 8 MG PO TABS: ORAL_TABLET | ORAL | 1 refills | Status: AC

## 2016-02-21 ENCOUNTER — Encounter: Payer: Self-pay | Admitting: Internal Medicine

## 2016-02-21 ENCOUNTER — Ambulatory Visit (INDEPENDENT_AMBULATORY_CARE_PROVIDER_SITE_OTHER): Payer: Medicare Other | Admitting: Internal Medicine

## 2016-02-21 VITALS — BP 112/70 | HR 76 | Temp 97.3°F | Resp 16 | Ht 66.5 in | Wt 149.0 lb

## 2016-02-21 DIAGNOSIS — J0140 Acute pansinusitis, unspecified: Secondary | ICD-10-CM | POA: Diagnosis not present

## 2016-02-21 DIAGNOSIS — E782 Mixed hyperlipidemia: Secondary | ICD-10-CM

## 2016-02-21 DIAGNOSIS — J0410 Acute tracheitis without obstruction: Secondary | ICD-10-CM

## 2016-02-21 MED ORDER — EZETIMIBE 10 MG PO TABS: ORAL_TABLET | ORAL | 1 refills | Status: DC

## 2016-02-21 MED ORDER — AZITHROMYCIN 250 MG PO TABS: ORAL_TABLET | ORAL | 1 refills | Status: DC

## 2016-02-21 MED ORDER — BENZONATATE 200 MG PO CAPS: ORAL_CAPSULE | ORAL | 1 refills | Status: DC

## 2016-02-21 MED ORDER — PREDNISONE 20 MG PO TABS: ORAL_TABLET | ORAL | 0 refills | Status: DC

## 2016-02-21 NOTE — Progress Notes (Signed)
Subjective:    Patient ID: Deborah [name], female    DOB: [birth year], 70 y.o.   MRN: [id number]  HPI  This very nice [phone] MWF presents with a 1 week hx/o head/chest congestion with cough productive of a yellow greenish sputum. Denies fever, chills, sweats, rash, dyspnea. Also reports previously address persistent nausea resolved after discontinuing Crestor.   Medication Sig  . ALPRAZolam (XANAX) 1 MG tablet TAKE 1/2-1 tab 2-3 x DAILY AS NEEDED FOR ANXIETY  . aspirin 81 MG tablet Take 81 mg by mouth daily.  Marland Kitchen VITAMIN-B COMPLEX  Take 1 capsule by mouth daily.  Marland Kitchen BIOTIN PO Take by mouth. Patient takes 10000 mcg plus Keratin 1 daily  . Cholecalciferol (VITAMIN D PO) Take 5,000 Units by mouth daily.  Marland Kitchen FLONASE nasal spray Place 2 sprays into both nostrils at bedtime.  . Multiple Vitamin Take 1 tablet by mouth daily.  . ondansetron (ZOFRAN ODT) 8 MG disintegrating tablet 8mg  ODT q4 hours prn nausea  . ondansetron (ZOFRAN) 8 MG tablet Take 1/2 to 1 tablet 3 x/day if needed for Nausea  . OVER THE COUNTER MEDICATION Magnesium with chelate Zinc 1 deaily  . OVER THE COUNTER MEDICATION Advanced Melatonin 1 daily  . rosuvastatin (CRESTOR) 20 MG tablet Take 1 tablet (20 mg total) by mouth daily.  . SUMAtriptan  20 MGnasal spray USE ONE DOSE IN THE NOSE NOW MAY REPEAT IN 2 HOURS,   . topiramate (TOPAMAX) 100 MG tablet Take 1 tablet 2 x day for Headache Prevention  . pantoprazole (PROTONIX) 40 MG tablet Take 1 tablet every morning  . ranitidine (ZANTAC) 300 MG tablet Take 1 tablet at bedtime for indigestion   Allergies  Allergen Reactions  . Banana     Nausea and vomiting  . Effexor [Venlafaxine]     insomnia  . Maxalt [Rizatriptan Benzoate] Other (See Comments)    Nausea and excessive salivation  . Tussin [Guaifenesin]     rash  . Zostavax [Zoster Vaccine Live]    Past Medical History:  Diagnosis Date  . Allergy   . Anemia   . Anxiety   . Arthritis   . Depression   . Hyperlipidemia   .  Hypertension   . Migraines   . Prediabetes   . RLS (restless legs syndrome)   . Vitamin D deficiency    Review of Systems  10 point systems review negative except as above.    Objective:   Physical Exam  BP 112/70   Pulse 76   Temp 97.3 F (36.3 C)   Resp 16   Ht 5' 6.5" (1.689 m)   Wt 149 lb (67.6 kg)   BMI 23.69 kg/m   Brassy cough. Hoarse voice. No strider.  HEENT - Eac's patent. TM's Nl. EOM's full. PERRLA. Bilat Maxillary tenderness. NasoOroPharynx clear. Neck - supple. Nl Thyroid. Carotids 2+ & No bruits, nodes, JVD Chest - Clear equal BS w/ofew scattered rales and no rhonchi or  wheezes. Cor - Nl HS. RRR w/o sig MGR. PP 1(+). No edema.  MS- FROM w/o deformities. Muscle power, tone and bulk Nl. Gait Nl. Neuro - No obvious Cr N abnormalities. Sensory, motor and Cerebellar functions appear Nl w/o focal abnormalities.    Assessment & Plan:   1. Acute pansinusitis, recurrence not specified  - azithromycin (ZITHROMAX) 250 MG tablet; Take 2 tablets (500 mg) on  Day 1,  followed by 1 tablet (250 mg) once daily on Days 2 through 5.  Dispense: 6 each;  Refill: 1  2. Tracheitis  - predniSONE (DELTASONE) 20 MG tablet; 1 tab 3 x day for 3 days, then 1 tab 2 x day for 3 days, then 1 tab 1 x day for 5 days  Dispense: 20 tablet; Refill: 0 - azithromycin (ZITHROMAX) 250 MG tablet; Take 2 tablets (500 mg) on  Day 1,  followed by 1 tablet (250 mg) once daily on Days 2 through 5.  Dispense: 6 each; Refill: 1 - benzonatate (TESSALON) 200 MG capsule; Take 1 perle 3 x/day to prevent cough  Dispense: 30 capsule; Refill: 1  3. Mixed hyperlipidemia  - since Statin Intolerant   - ezetimibe (ZETIA) 10 MG tablet; Take 1 tablet daily for Cholesterol  Dispense: 90 tablet; Refill: 1

## 2016-04-05 ENCOUNTER — Ambulatory Visit (INDEPENDENT_AMBULATORY_CARE_PROVIDER_SITE_OTHER): Payer: Medicare Other | Admitting: Internal Medicine

## 2016-04-05 ENCOUNTER — Encounter: Payer: Self-pay | Admitting: Internal Medicine

## 2016-04-05 VITALS — BP 106/78 | HR 68 | Temp 97.8°F | Resp 16 | Ht 66.0 in | Wt 151.4 lb

## 2016-04-05 DIAGNOSIS — Z0000 Encounter for general adult medical examination without abnormal findings: Secondary | ICD-10-CM | POA: Diagnosis not present

## 2016-04-05 DIAGNOSIS — E782 Mixed hyperlipidemia: Secondary | ICD-10-CM

## 2016-04-05 DIAGNOSIS — I10 Essential (primary) hypertension: Secondary | ICD-10-CM

## 2016-04-05 DIAGNOSIS — Z0001 Encounter for general adult medical examination with abnormal findings: Secondary | ICD-10-CM

## 2016-04-05 DIAGNOSIS — E559 Vitamin D deficiency, unspecified: Secondary | ICD-10-CM

## 2016-04-05 DIAGNOSIS — Z79899 Other long term (current) drug therapy: Secondary | ICD-10-CM

## 2016-04-05 DIAGNOSIS — Z1212 Encounter for screening for malignant neoplasm of rectum: Secondary | ICD-10-CM

## 2016-04-05 DIAGNOSIS — Z136 Encounter for screening for cardiovascular disorders: Secondary | ICD-10-CM

## 2016-04-05 DIAGNOSIS — R7303 Prediabetes: Secondary | ICD-10-CM

## 2016-04-05 DIAGNOSIS — R110 Nausea: Secondary | ICD-10-CM

## 2016-04-05 LAB — TSH: TSH: 2.3 m[IU]/L

## 2016-04-05 LAB — HEPATIC FUNCTION PANEL
ALK PHOS: 70 U/L (ref 33–130)
ALT: 7 U/L (ref 6–29)
AST: 17 U/L (ref 10–35)
Albumin: 3.9 g/dL (ref 3.6–5.1)
BILIRUBIN DIRECT: 0.1 mg/dL (ref <=0.2)
BILIRUBIN INDIRECT: 0.4 mg/dL (ref 0.2–1.2)
BILIRUBIN TOTAL: 0.5 mg/dL (ref 0.2–1.2)
Total Protein: 6.3 g/dL (ref 6.1–8.1)

## 2016-04-05 LAB — BASIC METABOLIC PANEL WITH GFR
BUN: 11 mg/dL (ref 7–25)
CHLORIDE: 107 mmol/L (ref 98–110)
CO2: 22 mmol/L (ref 20–31)
Calcium: 9.4 mg/dL (ref 8.6–10.4)
Creat: 0.83 mg/dL (ref 0.50–0.99)
GFR, EST NON AFRICAN AMERICAN: 72 mL/min (ref >=60)
GFR, Est African American: 83 mL/min (ref >=60)
Glucose, Bld: 76 mg/dL (ref 65–99)
POTASSIUM: 3.6 mmol/L (ref 3.5–5.3)
Sodium: 139 mmol/L (ref 135–146)

## 2016-04-05 LAB — CBC WITH DIFFERENTIAL/PLATELET
BASOS PCT: 1 %
Basophils Absolute: 56 cells/uL (ref 0–200)
EOS ABS: 112 {cells}/uL (ref 15–500)
Eosinophils Relative: 2 %
HCT: 39.1 % (ref 35.0–45.0)
Hemoglobin: 13 g/dL (ref 11.7–15.5)
Lymphocytes Relative: 37 %
Lymphs Abs: 2072 cells/uL (ref 850–3900)
MCH: 30.4 pg (ref 27.0–33.0)
MCHC: 33.2 g/dL (ref 32.0–36.0)
MCV: 91.4 fL (ref 80.0–100.0)
MONO ABS: 560 {cells}/uL (ref 200–950)
MONOS PCT: 10 %
MPV: 9.3 fL (ref 7.5–12.5)
NEUTROS ABS: 2800 {cells}/uL (ref 1500–7800)
Neutrophils Relative %: 50 %
PLATELETS: 307 10*3/uL (ref 140–400)
RBC: 4.28 MIL/uL (ref 3.80–5.10)
RDW: 13.8 % (ref 11.0–15.0)
WBC: 5.6 10*3/uL (ref 3.8–10.8)

## 2016-04-05 LAB — LIPID PANEL
CHOL/HDL RATIO: 4.3 ratio (ref <5.0)
Cholesterol: 282 mg/dL — ABNORMAL HIGH (ref <200)
HDL: 66 mg/dL (ref >50)
LDL CALC: 186 mg/dL — AB (ref <100)
TRIGLYCERIDES: 152 mg/dL — AB (ref <150)
VLDL: 30 mg/dL (ref <30)

## 2016-04-05 LAB — MAGNESIUM: Magnesium: 2.2 mg/dL (ref 1.5–2.5)

## 2016-04-05 MED ORDER — PROCHLORPERAZINE MALEATE 10 MG PO TABS: ORAL_TABLET | ORAL | 0 refills | Status: DC

## 2016-04-05 NOTE — Patient Instructions (Addendum)

## 2016-04-05 NOTE — Progress Notes (Signed)
[place] [facility] Unk Pinto, M.D.    [name]. [name], P.A.-C      Starlyn Skeans, P.A.-C  [facility]                [address] [place], [place] [phone] Telephone [phone] Telefax [phone]  Annual Screening/Preventative Visit & Comprehensive Evaluation &  Examination     This very nice 70 y.o. MWF presents for a Screening/Preventative Visit & comprehensive evaluation and management of multiple medical co-morbidities.  Patient has been followed for HTN, Prediabetes, Hyperlipidemia and Vitamin D Deficiency.     Over the last several months she has had intermittent nausea w/o emesis and labs have been unrevealing w/ nl CBC, HFP and amylase.   Abd U/S and Hida scans were negative & Nl and empiric trial with Pantoprazole & Ranitidine  both of which she has stopped) has failed to improve her nausea. She denies any particular medicine or food triggers, heartburn reflux or waterbrash sx's. She does use Zofran apparently with good rescue of her nausea. She has lost 22# over the the last 3 years which she reports was intentional and preceded her nausea.        Patient is monitored for labile HTN predates since 2010. Patient's BP has been controlled at home and patient denies any cardiac symptoms as chest pain, palpitations, shortness of breath, dizziness or ankle swelling. Today's BP is at goal - 106/78.     Patient's hyperlipidemia is notcontrolled with diet and medications. Patient denies myalgias, but has had ongoing issues with nausea which seemed to resolve with stopping Crestor, but now she admits her nausea persists.  Last lipids were off Crestor and not at goal: Lab Results  Component Value Date   CHOL 246 (H) 01/04/2016   HDL 67 01/04/2016   LDLCALC 157 (H) 01/04/2016   TRIG 110 01/04/2016   CHOLHDL 3.7 01/04/2016      Patient has prediabetes in 2001 predating with A1c 6.1% and patient denies  reactive hypoglycemic symptoms, visual blurring, diabetic polys, or paresthesias. Last A1c was at goal and normal: Lab Results  Component Value Date   HGBA1C 5.5 01/04/2016      Finally, patient has history of Vitamin D Deficiency of "18" in 2008 and last Vitamin D was at goal: Lab Results  Component Value Date   VD25OH 71 01/04/2016   Current Outpatient Prescriptions on File Prior to Visit  Medication Sig  . ALPRAZolam  1 MG tablet TAKE 1/2-1 tab 2-3 x/d as needed  . aspirin 81 MG Take 81 mg by mouth daily.  Marland Kitchen VITAMIN-B COMPLEX  Take 1 capsule by mouth daily.  Marland Kitchen BIOTIN 10,000 mcg+Keratin takes  1 daily  . VITAMIN D Take 5,000 Units by mouth daily.  Marland Kitchen ezetimibe  10 MG  Take 1 tablet daily for Cholesterol  . FLONASE nasal spray Place 2 sprays into both nostrils at bedtime.  . Multiple Vitamin  Take 1 tablet by mouth daily.  . Ondansetron-ODT 8 MG disintegrating tablet 8mg  ODT q4 hours prn nausea  . Magnesium w/chelate Zinc  1 daily  . Advanced Melatonin  1 daily  . pantoprazole 40 MG tablet Take 1 tablet every morning  . ranitidine  300 MG tablet Take 1 tablet at bedtime for indigestion  . rosuvastatin 20 MG tablet Take 1 tablet (20 mg total) by mouth daily.  Marland Kitchen  SUMAtriptan  20 MG nasal  USE ONE DOSE & MAY REPEAT IN 2 HOURS  . topiramate  100 MG tablet Take 1 tablet 2 x day for Headache Prevention   Allergies  Allergen Reactions  . Banana     Nausea and vomiting  . Effexor [Venlafaxine]     insomnia  . Maxalt [Rizatriptan Benzoate] Other (See Comments)    Nausea and excessive salivation  . Tussin [Guaifenesin]     rash  . Zostavax [Zoster Vaccine Live]    Past Medical History:  Diagnosis Date  . Allergy   . Anemia   . Anxiety   . Arthritis   . Depression   . Hyperlipidemia   . Hypertension   . Migraines   . Prediabetes   . RLS (restless legs syndrome)   . Vitamin D deficiency    Health Maintenance  Topic Date Due  . COLONOSCOPY  11/23/2016  . MAMMOGRAM   08/16/2017  . TETANUS/TDAP  12/27/2022  . INFLUENZA VACCINE  Completed  . DEXA SCAN  Completed  . ZOSTAVAX  Completed  . Hepatitis C Screening  Completed  . PNA vac Low Risk Adult  Completed   Immunization History  Administered Date(s) Administered  . Influenza, High Dose Seasonal PF 01/01/2014, 03/15/2015, 12/13/2015  . Influenza-Unspecified 12/26/2012  . Pneumococcal Conjugate-13 08/06/2014  . Pneumococcal-Unspecified 12/26/2012  . Td 12/26/2012  . Zoster 12/08/2010   Past Surgical History:  Procedure Laterality Date  . KNEE ARTHROSCOPY Bilateral 04/2014   [facility]  . MOHS SURGERY Right 04/2013   Squamous cell cacinoma  . SPINE SURGERY  2010   C6-C7 fusion   Family History  Problem Relation Age of Onset  . Diabetes Mother   . Heart disease Mother   . Heart disease Father   . Stroke Father   . Hypertension Father   . Hyperlipidemia Sister   . Hypertension Brother    Social History  Substance Use Topics  . Smoking status: Former Smoker    Packs/day: 1.00    Years: 4.00    Types: Cigarettes    Quit date: 03/25/1986  . Smokeless tobacco: Never Used  . Alcohol use Yes     Comment: rare    ROS Constitutional: Denies fever, chills, weight loss/gain, headaches, insomnia,  night sweats, and change in appetite. Does c/o fatigue. Eyes: Denies redness, blurred vision, diplopia, discharge, itchy, watery eyes.  ENT: Denies discharge, congestion, post nasal drip, epistaxis, sore throat, earache, hearing loss, dental pain, Tinnitus, Vertigo, Sinus pain, snoring.  Cardio: Denies chest pain, palpitations, irregular heartbeat, syncope, dyspnea, diaphoresis, orthopnea, PND, claudication, edema Respiratory: denies cough, dyspnea, DOE, pleurisy, hoarseness, laryngitis, wheezing.  Gastrointestinal: Denies dysphagia, heartburn, reflux, water brash, pain, cramps, nausea, vomiting, bloating, diarrhea, constipation, hematemesis, melena, hematochezia, jaundice,  hemorrhoids Genitourinary: Denies dysuria, frequency, urgency, nocturia, hesitancy, discharge, hematuria, flank pain Breast: Breast lumps, nipple discharge, bleeding.  Musculoskeletal: Denies arthralgia, myalgia, stiffness, Jt. Swelling, pain, limp, and strain/sprain. Denies falls. Skin: Denies puritis, rash, hives, warts, acne, eczema, changing in skin lesion Neuro: No weakness, tremor, incoordination, spasms, paresthesia, pain Psychiatric: Denies confusion, memory loss, sensory loss. Denies Depression. Endocrine: Denies change in weight, skin, hair change, nocturia, and paresthesia, diabetic polys, visual blurring, hyper / hypo glycemic episodes.  Heme/Lymph: No excessive bleeding, bruising, enlarged lymph nodes.  Physical Exam  BP 106/78   Pulse 68   Temp 97.8 F (36.6 C)   Resp 16   Ht 5\' 6"  (1.676 m)   Wt 151 lb 6.4  oz (68.7 kg)   BMI 24.44 kg/m   General Appearance: Well nourished and in no apparent distress.  Eyes: PERRLA, EOMs, conjunctiva no swelling or erythema, normal fundi and vessels. Sinuses: No frontal/maxillary tenderness ENT/Mouth: EACs patent / TMs  nl. Nares clear without erythema, swelling, mucoid exudates. Oral hygiene is good. No erythema, swelling, or exudate. Tongue normal, non-obstructing. Tonsils not swollen or erythematous. Hearing normal.  Neck: Supple, thyroid normal. No bruits, nodes or JVD. Respiratory: Respiratory effort normal.  BS equal and clear bilateral without rales, rhonci, wheezing or stridor. Cardio: Heart sounds are normal with regular rate and rhythm and no murmurs, rubs or gallops. Peripheral pulses are normal and equal bilaterally without edema. No aortic or femoral bruits. Chest: symmetric with normal excursions and percussion. Breasts: Symmetric, without lumps, nipple discharge, retractions, or fibrocystic changes.  Abdomen: Flat, soft with bowel sounds active. Nontender, no guarding, rebound, hernias, masses, or organomegaly.   Lymphatics: Non tender without lymphadenopathy.  Musculoskeletal: Full ROM all peripheral extremities, joint stability, 5/5 strength, and normal gait. Skin: Warm and dry without rashes, lesions, cyanosis, clubbing or  ecchymosis.  Neuro: Cranial nerves intact, reflexes equal bilaterally. Normal muscle tone, no cerebellar symptoms. Sensation intact.  Pysch: Alert and oriented X 3, normal affect, Insight and Judgment appropriate.   Assessment and Plan  1. Annual Preventative Screening Examination   2. Essential hypertension  - Microalbumin / creatinine urine ratio - EKG 12-Lead - Urinalysis, Routine w reflex microscopic - CBC with Differential/Platelet - BASIC METABOLIC PANEL WITH GFR - TSH  3. Mixed hyperlipidemia  - EKG 12-Lead - Hepatic function panel - Lipid panel - TSH  4. Prediabetes  - EKG 12-Lead - Hemoglobin A1c - Insulin, random  5. Vitamin D deficiency  - VITAMIN D 25 Hydroxy  6. Screening for rectal cancer  - POC Hemoccult Bld/Stl   7. Screening for ischemic heart disease  - EKG 12-Lead  8. Nausea without vomiting  - empiric trial with compazine daily as she prefers to defer GI referral til after the upcoming holidays  9. Medication management  - Urinalysis, Routine w reflex microscopic - CBC with Differential/Platelet - BASIC METABOLIC PANEL WITH GFR - Hepatic function panel - Magnesium       Continue prudent diet as discussed, weight control, BP monitoring, regular exercise, and medications. Discussed med's effects and SE's. Screening labs and tests as requested with regular follow-up as recommended. Over 40 minutes of exam, counseling, chart review and high complex critical decision making was performed.

## 2016-04-06 LAB — URINALYSIS, ROUTINE W REFLEX MICROSCOPIC
Bilirubin Urine: NEGATIVE
Glucose, UA: NEGATIVE
HGB URINE DIPSTICK: NEGATIVE
Ketones, ur: NEGATIVE
LEUKOCYTES UA: NEGATIVE
NITRITE: NEGATIVE
PH: 6 (ref 5.0–8.0)
Protein, ur: NEGATIVE
SPECIFIC GRAVITY, URINE: 1.011 (ref 1.001–1.035)

## 2016-04-06 LAB — MICROALBUMIN / CREATININE URINE RATIO
Creatinine, Urine: 108 mg/dL (ref 20–320)
Microalb Creat Ratio: 4 mcg/mg creat (ref <30)
Microalb, Ur: 0.4 mg/dL

## 2016-04-06 LAB — INSULIN, RANDOM: INSULIN: 4 u[IU]/mL (ref 2.0–19.6)

## 2016-04-06 LAB — HEMOGLOBIN A1C
HEMOGLOBIN A1C: 5.5 % (ref <5.7)
MEAN PLASMA GLUCOSE: 111 mg/dL

## 2016-04-06 LAB — VITAMIN D 25 HYDROXY (VIT D DEFICIENCY, FRACTURES): Vit D, 25-Hydroxy: 57 ng/mL (ref 30–100)

## 2016-04-17 DIAGNOSIS — A048 Other specified bacterial intestinal infections: Secondary | ICD-10-CM

## 2016-04-17 HISTORY — DX: Other specified bacterial intestinal infections: A04.8

## 2016-05-05 ENCOUNTER — Other Ambulatory Visit: Payer: Self-pay | Admitting: Internal Medicine

## 2016-05-05 DIAGNOSIS — R110 Nausea: Secondary | ICD-10-CM

## 2016-07-05 ENCOUNTER — Ambulatory Visit: Payer: Medicare Other | Admitting: Internal Medicine

## 2016-07-24 ENCOUNTER — Other Ambulatory Visit: Payer: Self-pay | Admitting: Internal Medicine

## 2016-07-24 DIAGNOSIS — Z1231 Encounter for screening mammogram for malignant neoplasm of breast: Secondary | ICD-10-CM

## 2016-07-30 ENCOUNTER — Other Ambulatory Visit: Payer: Self-pay | Admitting: Internal Medicine

## 2016-07-30 DIAGNOSIS — R110 Nausea: Secondary | ICD-10-CM

## 2016-08-18 ENCOUNTER — Ambulatory Visit
Admission: RE | Admit: 2016-08-18 | Discharge: 2016-08-18 | Disposition: A | Discharge status: Home / Self Care | Payer: Medicare Other | Source: Ambulatory Visit | Attending: Internal Medicine | Admitting: Internal Medicine

## 2016-08-18 DIAGNOSIS — Z1231 Encounter for screening mammogram for malignant neoplasm of breast: Secondary | ICD-10-CM

## 2016-10-03 ENCOUNTER — Other Ambulatory Visit: Payer: Self-pay | Admitting: Internal Medicine

## 2016-10-03 DIAGNOSIS — R110 Nausea: Secondary | ICD-10-CM

## 2016-10-06 ENCOUNTER — Ambulatory Visit: Payer: Self-pay | Admitting: Internal Medicine

## 2016-10-16 DIAGNOSIS — Z8719 Personal history of other diseases of the digestive system: Secondary | ICD-10-CM

## 2016-10-16 HISTORY — PX: UPPER GI ENDOSCOPY: SHX6162

## 2016-10-16 HISTORY — DX: Personal history of other diseases of the digestive system: Z87.19

## 2016-10-17 ENCOUNTER — Other Ambulatory Visit: Payer: Self-pay | Admitting: *Deleted

## 2016-10-17 MED ORDER — SUMATRIPTAN 20 MG/ACT NA SOLN: NASAL | 0 refills | Status: DC

## 2016-10-26 ENCOUNTER — Ambulatory Visit (INDEPENDENT_AMBULATORY_CARE_PROVIDER_SITE_OTHER): Payer: Medicare Other | Admitting: Internal Medicine

## 2016-10-26 VITALS — BP 118/80 | HR 91 | Temp 97.7°F | Resp 16 | Ht 66.0 in | Wt 157.0 lb

## 2016-10-26 DIAGNOSIS — E782 Mixed hyperlipidemia: Secondary | ICD-10-CM

## 2016-10-26 DIAGNOSIS — Z79899 Other long term (current) drug therapy: Secondary | ICD-10-CM

## 2016-10-26 DIAGNOSIS — E559 Vitamin D deficiency, unspecified: Secondary | ICD-10-CM | POA: Diagnosis not present

## 2016-10-26 DIAGNOSIS — I10 Essential (primary) hypertension: Secondary | ICD-10-CM | POA: Diagnosis not present

## 2016-10-26 DIAGNOSIS — R7303 Prediabetes: Secondary | ICD-10-CM

## 2016-10-26 LAB — CBC WITH DIFFERENTIAL/PLATELET
BASOS PCT: 1 %
Basophils Absolute: 62 cells/uL (ref 0–200)
EOS PCT: 3 %
Eosinophils Absolute: 186 cells/uL (ref 15–500)
HCT: 38.9 % (ref 35.0–45.0)
HEMOGLOBIN: 13 g/dL (ref 11.7–15.5)
LYMPHS ABS: 2170 {cells}/uL (ref 850–3900)
Lymphocytes Relative: 35 %
MCH: 29.8 pg (ref 27.0–33.0)
MCHC: 33.4 g/dL (ref 32.0–36.0)
MCV: 89.2 fL (ref 80.0–100.0)
MPV: 9.5 fL (ref 7.5–12.5)
Monocytes Absolute: 682 cells/uL (ref 200–950)
Monocytes Relative: 11 %
NEUTROS ABS: 3100 {cells}/uL (ref 1500–7800)
Neutrophils Relative %: 50 %
Platelets: 339 10*3/uL (ref 140–400)
RBC: 4.36 MIL/uL (ref 3.80–5.10)
RDW: 12.8 % (ref 11.0–15.0)
WBC: 6.2 10*3/uL (ref 3.8–10.8)

## 2016-10-26 LAB — HEPATIC FUNCTION PANEL
ALT: 8 U/L (ref 6–29)
AST: 16 U/L (ref 10–35)
Albumin: 4.2 g/dL (ref 3.6–5.1)
Alkaline Phosphatase: 79 U/L (ref 33–130)
BILIRUBIN DIRECT: 0.1 mg/dL (ref <=0.2)
BILIRUBIN INDIRECT: 0.5 mg/dL (ref 0.2–1.2)
Total Bilirubin: 0.6 mg/dL (ref 0.2–1.2)
Total Protein: 6.8 g/dL (ref 6.1–8.1)

## 2016-10-26 LAB — BASIC METABOLIC PANEL WITH GFR
BUN: 20 mg/dL (ref 7–25)
CALCIUM: 9.7 mg/dL (ref 8.6–10.4)
CHLORIDE: 101 mmol/L (ref 98–110)
CO2: 24 mmol/L (ref 20–31)
Creat: 0.96 mg/dL — ABNORMAL HIGH (ref 0.60–0.93)
GFR, EST NON AFRICAN AMERICAN: 60 mL/min (ref >=60)
GFR, Est African American: 69 mL/min (ref >=60)
Glucose, Bld: 82 mg/dL (ref 65–99)
Potassium: 3.8 mmol/L (ref 3.5–5.3)
SODIUM: 137 mmol/L (ref 135–146)

## 2016-10-26 LAB — TSH: TSH: 2.06 mIU/L

## 2016-10-26 LAB — LIPID PANEL
CHOL/HDL RATIO: 3.6 ratio (ref <5.0)
CHOLESTEROL: 224 mg/dL — AB (ref <200)
HDL: 63 mg/dL (ref >50)
LDL Cholesterol: 138 mg/dL — ABNORMAL HIGH (ref <100)
Triglycerides: 115 mg/dL (ref <150)
VLDL: 23 mg/dL (ref <30)

## 2016-10-26 LAB — MAGNESIUM: Magnesium: 2.1 mg/dL (ref 1.5–2.5)

## 2016-10-26 NOTE — Patient Instructions (Signed)

## 2016-10-27 LAB — HEMOGLOBIN A1C
HEMOGLOBIN A1C: 5.6 % (ref <5.7)
Mean Plasma Glucose: 114 mg/dL

## 2016-10-27 LAB — INSULIN, RANDOM: Insulin: 5.8 u[IU]/mL (ref 2.0–19.6)

## 2016-10-27 LAB — VITAMIN D 25 HYDROXY (VIT D DEFICIENCY, FRACTURES): Vit D, 25-Hydroxy: 117 ng/mL — ABNORMAL HIGH (ref 30–100)

## 2016-10-28 ENCOUNTER — Encounter: Payer: Self-pay | Admitting: Internal Medicine

## 2016-10-28 NOTE — Progress Notes (Signed)
This very nice 71 y.o. MWF presents for 6 month follow up with Hypertension, Hyperlipidemia, Pre-Diabetes and Vitamin D Deficiency. Patient has has on-going issues with recurrent nausea and recently had EGD on 10/16/2016 and was rx'd Pylera for H.pylori and she reports her nausea has resolved for the last 4 days.      Patient is followed expectantly for labile  HTN since 2010 & BP has been controlled at home. Today's BP is at goal - 118/80. Patient has had no complaints of any cardiac type chest pain, palpitations, dyspnea/orthopnea/PND, dizziness, claudication, or dependent edema.     Hyperlipidemia had been controlled with diet & meds, but she had stopped her Crestor suspect for her nausea. Patient denies myalgias or other med SE's. Current Lipids were not controlled off of her Crestor:  Lab Results  Component Value Date   CHOL 224 (H) 10/26/2016   HDL 63 10/26/2016   LDLCALC 138 (H) 10/26/2016   TRIG 115 10/26/2016   CHOLHDL 3.6 10/26/2016      Also, the patient has history of PreDiabetes (A1c 6.1% in 2011) and has had no symptoms of reactive hypoglycemia, diabetic polys, paresthesias or visual blurring.  Last A1c was at goal: Lab Results  Component Value Date   HGBA1C 5.6 10/26/2016      Further, the patient also has history of Vitamin D Deficiency ("18" in 2008) and supplements vitamin D without any suspected side-effects. Current  vitamin D was sl elevated and dose is tapered.  Lab Results  Component Value Date   VD25OH 117 (H) 10/26/2016   Current Outpatient Prescriptions on File Prior to Visit  Medication Sig  . B Complex Vitamins (VITAMIN-B COMPLEX PO) Take 1 capsule by mouth daily.  Marland Kitchen BIOTIN PO Take by mouth. Patient takes 10000 mcg plus Keratin 1 daily  . Cholecalciferol (VITAMIN D PO) Take 5,000 Units by mouth daily.  . fluticasone (FLONASE) 50 MCG/ACT nasal spray Place 2 sprays into both nostrils at bedtime.  . Multiple Vitamin (MULTIVITAMIN) tablet Take 1 tablet by mouth  daily.  . ranitidine (ZANTAC) 300 MG tablet   . rosuvastatin (CRESTOR) 20 MG tablet   . SUMAtriptan (IMITREX) 20 MG/ACT nasal spray USE ONE DOSE IN THE NOSE NOW MAY REPEAT IN 2 HOURS, MAX 2 DOSES IN 24 HOURS  . topiramate (TOPAMAX) 100 MG tablet Take 1 tablet 2 x day for Headache Prevention  . ezetimibe (ZETIA) 10 MG tablet Take 1 tablet daily for Cholesterol   No current facility-administered medications on file prior to visit.    Allergies  Allergen Reactions  . Banana     Nausea and vomiting  . Effexor [Venlafaxine]     insomnia  . Maxalt [Rizatriptan Benzoate] Other (See Comments)    Nausea and excessive salivation  . Tussin [Guaifenesin]     rash  . Zostavax [Zoster Vaccine Live]    PMHx:   Past Medical History:  Diagnosis Date  . Allergy   . Anemia   . Anxiety   . Arthritis   . Depression   . Hyperlipidemia   . Hypertension   . Migraines   . Prediabetes   . RLS (restless legs syndrome)   . Vitamin D deficiency    Immunization History  Administered Date(s) Administered  . Influenza, High Dose Seasonal PF 01/01/2014, 03/15/2015, 12/13/2015  . Influenza-Unspecified 12/26/2012  . Pneumococcal Conjugate-13 08/06/2014  . Pneumococcal-Unspecified 12/26/2012  . Td 12/26/2012  . Zoster 12/08/2010   Past Surgical History:  Procedure Laterality  Date  . KNEE ARTHROSCOPY Bilateral 04/2014   [facility]  . MOHS SURGERY Right 04/2013   Squamous cell cacinoma  . SPINE SURGERY  2010   C6-C7 fusion   FHx:    Reviewed / unchanged  SHx:    Reviewed / unchanged  Systems Review:  Constitutional: Denies fever, chills, wt changes, headaches, insomnia, fatigue, night sweats, change in appetite. Eyes: Denies redness, blurred vision, diplopia, discharge, itchy, watery eyes.  ENT: Denies discharge, congestion, post nasal drip, epistaxis, sore throat, earache, hearing loss, dental pain, tinnitus, vertigo, sinus pain, snoring.  CV: Denies chest pain, palpitations, irregular  heartbeat, syncope, dyspnea, diaphoresis, orthopnea, PND, claudication or edema. Respiratory: denies cough, dyspnea, DOE, pleurisy, hoarseness, laryngitis, wheezing.  Gastrointestinal: Denies dysphagia, odynophagia, heartburn, reflux, water brash, abdominal pain or cramps, nausea, vomiting, bloating, diarrhea, constipation, hematemesis, melena, hematochezia  or hemorrhoids. Genitourinary: Denies dysuria, frequency, urgency, nocturia, hesitancy, discharge, hematuria or flank pain. Musculoskeletal: Denies arthralgias, myalgias, stiffness, jt. swelling, pain, limping or strain/sprain.  Skin: Denies pruritus, rash, hives, warts, acne, eczema or change in skin lesion(s). Neuro: No weakness, tremor, incoordination, spasms, paresthesia or pain. Psychiatric: Denies confusion, memory loss or sensory loss. Endo: Denies change in weight, skin or hair change.  Heme/Lymph: No excessive bleeding, bruising or enlarged lymph nodes.  Physical Exam  BP 118/80   Pulse 91   Temp 97.7 F (36.5 C)   Resp 16   Ht 5\' 6"  (1.676 m)   Wt 157 lb (71.2 kg)   BMI 25.34 kg/m   Appears well nourished, well groomed  and in no distress.  Eyes: PERRLA, EOMs, conjunctiva no swelling or erythema. Sinuses: No frontal/maxillary tenderness ENT/Mouth: EAC's clear, TM's nl w/o erythema, bulging. Nares clear w/o erythema, swelling, exudates. Oropharynx clear without erythema or exudates. Oral hygiene is good. Tongue normal, non obstructing. Hearing intact.  Neck: Supple. Thyroid nl. Car 2+/2+ without bruits, nodes or JVD. Chest: Respirations nl with BS clear & equal w/o rales, rhonchi, wheezing or stridor.  Cor: Heart sounds normal w/ regular rate and rhythm without sig. murmurs, gallops, clicks or rubs. Peripheral pulses normal and equal  without edema.  Abdomen: Soft & bowel sounds normal. Non-tender w/o guarding, rebound, hernias, masses or organomegaly.  Lymphatics: Unremarkable.  Musculoskeletal: Full ROM all peripheral  extremities, joint stability, 5/5 strength and normal gait.  Skin: Warm, dry without exposed rashes, lesions or ecchymosis apparent.  Neuro: Cranial nerves intact, reflexes equal bilaterally. Sensory-motor testing grossly intact. Tendon reflexes grossly intact.  Pysch: Alert & oriented x 3.  Insight and judgement nl & appropriate. No ideations.  Assessment and Plan:  1. Essential hypertension  - Continue medication, monitor blood pressure at home.  - Continue DASH diet. Reminder to go to the ER if any CP,  SOB, nausea, dizziness, severe HA, changes vision/speech.  - CBC with Differential/Platelet - BASIC METABOLIC PANEL WITH GFR - Magnesium - TSH  2. Hyperlipidemia, mixed  - Continue diet/meds, exercise,& lifestyle modifications.  - Continue monitor periodic cholesterol/liver & renal functions   - Hepatic function panel - Lipid panel - TSH  3. Prediabetes  - Continue diet, exercise, lifestyle modifications.  - Monitor appropriate labs.  - Hemoglobin A1c - Insulin, random  4. Vitamin D deficiency  - Continue supplementation.  - VITAMIN D 25 Hydroxy   5. Medication management   - CBC with Differential/Platelet - BASIC METABOLIC PANEL WITH GFR - Hepatic function panel - Magnesium - Lipid panel - TSH - Hemoglobin A1c - Insulin, random -  VITAMIN D 25 Hydroxy       Discussed  regular exercise, BP monitoring, weight control to achieve/maintain BMI less than 25 and discussed med and SE's. Recommended labs to assess and monitor clinical status with further disposition pending results of labs. Over 30 minutes of exam, counseling, chart review was performed.

## 2016-10-30 ENCOUNTER — Other Ambulatory Visit: Payer: Self-pay | Admitting: Internal Medicine

## 2016-11-07 ENCOUNTER — Encounter: Payer: Self-pay | Admitting: Internal Medicine

## 2016-11-09 ENCOUNTER — Other Ambulatory Visit: Payer: Self-pay | Admitting: *Deleted

## 2016-11-09 MED ORDER — ROSUVASTATIN CALCIUM 20 MG PO TABS: 20.0000 mg | ORAL_TABLET | Freq: Every day | ORAL | 1 refills | Status: DC

## 2016-11-15 DIAGNOSIS — I700 Atherosclerosis of aorta: Secondary | ICD-10-CM

## 2016-11-15 HISTORY — DX: Atherosclerosis of aorta: I70.0

## 2016-11-21 ENCOUNTER — Other Ambulatory Visit: Payer: Self-pay | Admitting: Gastroenterology

## 2016-11-21 DIAGNOSIS — R1013 Epigastric pain: Secondary | ICD-10-CM

## 2016-11-23 ENCOUNTER — Ambulatory Visit
Admission: RE | Admit: 2016-11-23 | Discharge: 2016-11-23 | Disposition: A | Discharge status: Home / Self Care | Payer: Medicare Other | Source: Ambulatory Visit | Attending: Gastroenterology | Admitting: Gastroenterology

## 2016-11-23 DIAGNOSIS — R1013 Epigastric pain: Secondary | ICD-10-CM

## 2016-11-23 MED ORDER — IOPAMIDOL (ISOVUE-300) INJECTION 61%
100.0000 mL | Freq: Once | INTRAVENOUS | Status: AC | PRN
  Administered 2016-11-23: 100 mL via INTRAVENOUS

## 2016-11-28 ENCOUNTER — Other Ambulatory Visit: Payer: Self-pay | Admitting: Internal Medicine

## 2016-11-28 MED ORDER — ALPRAZOLAM ER 2 MG PO TB24: ORAL_TABLET | ORAL | 0 refills | Status: DC

## 2016-12-11 NOTE — Progress Notes (Signed)
   Subjective:    Patient ID: Deborah [name], female    DOB: 02-Mar-1946, 71 y.o.   MRN: [id number]  HPI   Patient is a very nice 71 yo MWF with HTN 7 HLD who had long prodrome of  chronic nausea recently treated in July by Dr Earlean Shawl for H. Pylori with f/u this month showing neg fecal result showing eradication.  She reports moderate difficulty with "GI distress" while taking her Pylera which recovered upon completion. She also on 11/23/2016 had a Negative Abd CT scan. Of concern, patient had self d/c'd her Crestor for concerns of contribution to her nausea and despite Tx w/Zetia her LDL was 138 last month. She relates she had resumed her Crestor 2-3 weeks ago w/o N/V.   Medication Sig  . ALPRAZolam XR 2 MG  Take 1 tab every morning for anxiety attacks  . B Complex Vitamins Take 1 cap daily.  Marland Kitchen BIOTIN  Take 10,000 mcg plus Keratin 1 daily  . VITAMIN D 5,000 Units Take daily.  Marland Kitchen ezetimibe 10 MG tablet Take 1 tab daily for Cholesterol  . famotidine  40 MG tablet Take Daily.  Marland Kitchen FLONASE  nasal spray 2 sprays into nostrils at bedtime.  . rosuvastatin 20 MG tablet Take 1 tab daily.  . SUMAtriptan  20 MG nasal spry USE Nasal  &  MAY REPEAT IN 2 HOURS  . topiramate  100 MG tablet Take 1 tab 2 x day for Headache Prevention  . Multiple Vitamin Take 1 tab  daily.  . ondansetron  8 MG tablet TAKE 1/2-1 TAB 3 x DAILY IF NEEDED FOR NAUSEA   Allergies  Allergen Reactions  . Banana     Nausea and vomiting  . Effexor [Venlafaxine]     insomnia  . Maxalt [Rizatriptan Benzoate] Other (See Comments)    Nausea and excessive salivation  . Tussin [Guaifenesin]     rash  . Zostavax [Zoster Vaccine Live]    Past Medical History:  Diagnosis Date  . Allergy   . Anemia   . Anxiety   . Arthritis   . Depression   . Hyperlipidemia   . Hypertension   . Migraines   . Prediabetes   . RLS (restless legs syndrome)   . Vitamin D deficiency    Past Surgical History:  Procedure Laterality Date  . KNEE  ARTHROSCOPY Bilateral 04/2014   [facility]  . MOHS SURGERY Right 04/2013   Squamous cell cacinoma  . SPINE SURGERY  2010   C6-C7 fusion   Review of Systems  10 point systems review negative except as above.    Objective:   Physical Exam  BP 110/76   Pulse 78   Temp (!) 97.4 F (36.3 C)   Ht 5\' 6"  (1.676 m)   Wt 156 lb (70.8 kg)   SpO2 94%   BMI 25.18 kg/m   HEENT - WNL. Neck - supple.  Chest - Clear equal BS. Cor - Nl HS. RRR w/o sig MGR. PP 1(+). No edema. Abd - Soft & benign. MS- FROM w/o deformities.  Gait Nl. Neuro -  Nl w/o focal abnormalities.    Assessment & Plan:   1. H. pylori infection   2. Gastroesophageal reflux disease, esophagitis presence not specified   3. Essential hypertension   4. Hyperlipidemia, mixed  - Discussed diet & med & SE's . Deferred labs til next OV as she's feeling well today

## 2016-12-12 ENCOUNTER — Ambulatory Visit (INDEPENDENT_AMBULATORY_CARE_PROVIDER_SITE_OTHER): Payer: Medicare Other | Admitting: Internal Medicine

## 2016-12-12 ENCOUNTER — Encounter: Payer: Self-pay | Admitting: Internal Medicine

## 2016-12-12 VITALS — BP 110/76 | HR 78 | Temp 97.4°F | Ht 66.0 in | Wt 156.0 lb

## 2016-12-12 DIAGNOSIS — I10 Essential (primary) hypertension: Secondary | ICD-10-CM | POA: Diagnosis not present

## 2016-12-12 DIAGNOSIS — E782 Mixed hyperlipidemia: Secondary | ICD-10-CM | POA: Diagnosis not present

## 2016-12-12 DIAGNOSIS — K219 Gastro-esophageal reflux disease without esophagitis: Secondary | ICD-10-CM

## 2016-12-12 DIAGNOSIS — A048 Other specified bacterial intestinal infections: Secondary | ICD-10-CM | POA: Diagnosis not present

## 2016-12-12 NOTE — Patient Instructions (Signed)

## 2016-12-15 ENCOUNTER — Observation Stay (HOSPITAL_COMMUNITY)
Admission: EM | Admit: 2016-12-15 | Discharge: 2016-12-16 | Disposition: A | Discharge status: Home / Self Care | Payer: Medicare Other | Attending: Internal Medicine | Admitting: Internal Medicine

## 2016-12-15 ENCOUNTER — Encounter (HOSPITAL_COMMUNITY): Payer: Self-pay | Admitting: *Deleted

## 2016-12-15 ENCOUNTER — Emergency Department (HOSPITAL_COMMUNITY): Payer: Medicare Other

## 2016-12-15 ENCOUNTER — Other Ambulatory Visit: Payer: Self-pay

## 2016-12-15 DIAGNOSIS — E854 Organ-limited amyloidosis: Secondary | ICD-10-CM | POA: Diagnosis not present

## 2016-12-15 DIAGNOSIS — F411 Generalized anxiety disorder: Secondary | ICD-10-CM | POA: Diagnosis not present

## 2016-12-15 DIAGNOSIS — R7303 Prediabetes: Secondary | ICD-10-CM | POA: Insufficient documentation

## 2016-12-15 DIAGNOSIS — Z87891 Personal history of nicotine dependence: Secondary | ICD-10-CM | POA: Insufficient documentation

## 2016-12-15 DIAGNOSIS — E785 Hyperlipidemia, unspecified: Secondary | ICD-10-CM | POA: Diagnosis not present

## 2016-12-15 DIAGNOSIS — E782 Mixed hyperlipidemia: Secondary | ICD-10-CM | POA: Diagnosis not present

## 2016-12-15 DIAGNOSIS — F329 Major depressive disorder, single episode, unspecified: Secondary | ICD-10-CM | POA: Insufficient documentation

## 2016-12-15 DIAGNOSIS — E559 Vitamin D deficiency, unspecified: Secondary | ICD-10-CM | POA: Diagnosis not present

## 2016-12-15 DIAGNOSIS — G9341 Metabolic encephalopathy: Secondary | ICD-10-CM | POA: Insufficient documentation

## 2016-12-15 DIAGNOSIS — Z7902 Long term (current) use of antithrombotics/antiplatelets: Secondary | ICD-10-CM | POA: Diagnosis not present

## 2016-12-15 DIAGNOSIS — G43909 Migraine, unspecified, not intractable, without status migrainosus: Secondary | ICD-10-CM | POA: Diagnosis not present

## 2016-12-15 DIAGNOSIS — R410 Disorientation, unspecified: Secondary | ICD-10-CM | POA: Diagnosis present

## 2016-12-15 DIAGNOSIS — R4701 Aphasia: Secondary | ICD-10-CM | POA: Insufficient documentation

## 2016-12-15 DIAGNOSIS — G2581 Restless legs syndrome: Secondary | ICD-10-CM | POA: Diagnosis not present

## 2016-12-15 DIAGNOSIS — G459 Transient cerebral ischemic attack, unspecified: Secondary | ICD-10-CM | POA: Diagnosis not present

## 2016-12-15 DIAGNOSIS — Z79899 Other long term (current) drug therapy: Secondary | ICD-10-CM | POA: Diagnosis not present

## 2016-12-15 DIAGNOSIS — I10 Essential (primary) hypertension: Secondary | ICD-10-CM | POA: Diagnosis not present

## 2016-12-15 DIAGNOSIS — G40909 Epilepsy, unspecified, not intractable, without status epilepticus: Secondary | ICD-10-CM | POA: Diagnosis present

## 2016-12-15 DIAGNOSIS — Z8673 Personal history of transient ischemic attack (TIA), and cerebral infarction without residual deficits: Secondary | ICD-10-CM | POA: Diagnosis present

## 2016-12-15 DIAGNOSIS — I680 Cerebral amyloid angiopathy: Secondary | ICD-10-CM | POA: Diagnosis not present

## 2016-12-15 DIAGNOSIS — G9340 Encephalopathy, unspecified: Secondary | ICD-10-CM | POA: Diagnosis not present

## 2016-12-15 DIAGNOSIS — R404 Transient alteration of awareness: Secondary | ICD-10-CM

## 2016-12-15 DIAGNOSIS — R7309 Other abnormal glucose: Secondary | ICD-10-CM | POA: Diagnosis present

## 2016-12-15 HISTORY — DX: Epilepsy, unspecified, not intractable, without status epilepticus: G40.909

## 2016-12-15 LAB — CBC WITH DIFFERENTIAL/PLATELET
Basophils Absolute: 0.1 10*3/uL (ref 0.0–0.1)
Basophils Relative: 1 %
Eosinophils Absolute: 0.1 10*3/uL (ref 0.0–0.7)
Eosinophils Relative: 1 %
HCT: 40.2 % (ref 36.0–46.0)
Hemoglobin: 13.5 g/dL (ref 12.0–15.0)
Lymphocytes Relative: 29 %
Lymphs Abs: 1.8 10*3/uL (ref 0.7–4.0)
MCH: 29.9 pg (ref 26.0–34.0)
MCHC: 33.6 g/dL (ref 30.0–36.0)
MCV: 88.9 fL (ref 78.0–100.0)
Monocytes Absolute: 0.4 10*3/uL (ref 0.1–1.0)
Monocytes Relative: 7 %
Neutro Abs: 3.9 10*3/uL (ref 1.7–7.7)
Neutrophils Relative %: 62 %
Platelets: 262 10*3/uL (ref 150–400)
RBC: 4.52 MIL/uL (ref 3.87–5.11)
RDW: 13.1 % (ref 11.5–15.5)
WBC: 6.2 10*3/uL (ref 4.0–10.5)

## 2016-12-15 LAB — COMPREHENSIVE METABOLIC PANEL
ALT: 14 U/L (ref 14–54)
AST: 25 U/L (ref 15–41)
Albumin: 4.1 g/dL (ref 3.5–5.0)
Alkaline Phosphatase: 63 U/L (ref 38–126)
Anion gap: 8 (ref 5–15)
BUN: 11 mg/dL (ref 6–20)
CO2: 21 mmol/L — ABNORMAL LOW (ref 22–32)
Calcium: 9.6 mg/dL (ref 8.9–10.3)
Chloride: 109 mmol/L (ref 101–111)
Creatinine, Ser: 1.02 mg/dL — ABNORMAL HIGH (ref 0.44–1.00)
GFR calc Af Amer: >60 mL/min (ref >60)
GFR calc non Af Amer: 54 mL/min — ABNORMAL LOW (ref >60)
Glucose, Bld: 108 mg/dL — ABNORMAL HIGH (ref 65–99)
Potassium: 3.7 mmol/L (ref 3.5–5.1)
Sodium: 138 mmol/L (ref 135–145)
Total Bilirubin: 0.6 mg/dL (ref 0.3–1.2)
Total Protein: 7.3 g/dL (ref 6.5–8.1)

## 2016-12-15 LAB — POCT I-STAT 3, VENOUS BLOOD GAS (G3P V)
Acid-base deficit: 4 mmol/L — ABNORMAL HIGH (ref 0.0–2.0)
Bicarbonate: 21.6 mmol/L (ref 20.0–28.0)
O2 SAT: 27 %
PCO2 VEN: 40.3 mmHg — AB (ref 44.0–60.0)
TCO2: 23 mmol/L (ref 22–32)
pH, Ven: 7.338 (ref 7.250–7.430)
pO2, Ven: 19 mmHg — CL (ref 32.0–45.0)

## 2016-12-15 LAB — URINALYSIS, ROUTINE W REFLEX MICROSCOPIC
Bilirubin Urine: NEGATIVE
Glucose, UA: NEGATIVE mg/dL
Hgb urine dipstick: NEGATIVE
Ketones, ur: 5 mg/dL — AB
Leukocytes, UA: NEGATIVE
Nitrite: NEGATIVE
Protein, ur: NEGATIVE mg/dL
Specific Gravity, Urine: 1.008 (ref 1.005–1.030)
pH: 7 (ref 5.0–8.0)

## 2016-12-15 LAB — SEDIMENTATION RATE: SED RATE: 33 mm/h — AB (ref 0–22)

## 2016-12-15 LAB — TSH: TSH: 2.248 u[IU]/mL (ref 0.350–4.500)

## 2016-12-15 LAB — AMMONIA: AMMONIA: 46 umol/L — AB (ref 9–35)

## 2016-12-15 MED ORDER — ACETAMINOPHEN 325 MG PO TABS: 650.0000 mg | ORAL_TABLET | ORAL | Status: DC | PRN

## 2016-12-15 MED ORDER — ASPIRIN 325 MG PO TABS
325.0000 mg | ORAL_TABLET | Freq: Every day | ORAL | Status: DC
  Administered 2016-12-15: 325 mg via ORAL
  Filled 2016-12-15: qty 1

## 2016-12-15 MED ORDER — STROKE: EARLY STAGES OF RECOVERY BOOK
Freq: Once | Status: AC
  Administered 2016-12-16: 04:00:00
  Filled 2016-12-15 (×2): qty 1

## 2016-12-15 MED ORDER — ACETAMINOPHEN 160 MG/5ML PO SOLN: 650.0000 mg | ORAL | Status: DC | PRN

## 2016-12-15 MED ORDER — TOPIRAMATE 100 MG PO TABS
100.0000 mg | ORAL_TABLET | Freq: Two times a day (BID) | ORAL | Status: DC
  Administered 2016-12-15: 100 mg via ORAL
  Filled 2016-12-15: qty 1

## 2016-12-15 MED ORDER — ALPRAZOLAM ER 1 MG PO TB24: 1.0000 mg | ORAL_TABLET | Freq: Two times a day (BID) | ORAL | Status: DC | PRN

## 2016-12-15 MED ORDER — ALPRAZOLAM 0.5 MG PO TABS: 1.0000 mg | ORAL_TABLET | Freq: Two times a day (BID) | ORAL | Status: DC | PRN

## 2016-12-15 MED ORDER — KETOROLAC TROMETHAMINE 30 MG/ML IJ SOLN
30.0000 mg | Freq: Once | INTRAMUSCULAR | Status: AC
  Administered 2016-12-15: 30 mg via INTRAVENOUS
  Filled 2016-12-15: qty 1

## 2016-12-15 MED ORDER — EZETIMIBE 10 MG PO TABS
10.0000 mg | ORAL_TABLET | Freq: Every day | ORAL | Status: DC
  Administered 2016-12-16: 10 mg via ORAL
  Filled 2016-12-15 (×2): qty 1

## 2016-12-15 MED ORDER — SODIUM CHLORIDE 0.9 % IV SOLN
INTRAVENOUS | Status: DC
  Administered 2016-12-15: 23:00:00 via INTRAVENOUS

## 2016-12-15 MED ORDER — ACETAMINOPHEN 650 MG RE SUPP: 650.0000 mg | RECTAL | Status: DC | PRN

## 2016-12-15 MED ORDER — FAMOTIDINE 20 MG PO TABS
40.0000 mg | ORAL_TABLET | Freq: Every day | ORAL | Status: DC
  Administered 2016-12-15: 40 mg via ORAL
  Filled 2016-12-15: qty 2

## 2016-12-15 MED ORDER — PROCHLORPERAZINE EDISYLATE 5 MG/ML IJ SOLN
10.0000 mg | Freq: Once | INTRAMUSCULAR | Status: AC
  Administered 2016-12-15: 10 mg via INTRAVENOUS
  Filled 2016-12-15: qty 2

## 2016-12-15 MED ORDER — ASPIRIN 300 MG RE SUPP: 300.0000 mg | Freq: Every day | RECTAL | Status: DC

## 2016-12-15 MED ORDER — SUMATRIPTAN SUCCINATE 6 MG/0.5ML ~~LOC~~ SOLN
6.0000 mg | Freq: Once | SUBCUTANEOUS | Status: AC
  Administered 2016-12-15: 6 mg via SUBCUTANEOUS
  Filled 2016-12-15: qty 0.5

## 2016-12-15 MED ORDER — ROSUVASTATIN CALCIUM 20 MG PO TABS
20.0000 mg | ORAL_TABLET | Freq: Every day | ORAL | Status: DC
  Filled 2016-12-15: qty 1

## 2016-12-15 MED ORDER — SODIUM CHLORIDE 0.9 % IV BOLUS (SEPSIS)
1000.0000 mL | Freq: Once | INTRAVENOUS | Status: AC
  Administered 2016-12-15: 1000 mL via INTRAVENOUS

## 2016-12-15 MED ORDER — SENNOSIDES-DOCUSATE SODIUM 8.6-50 MG PO TABS: 1.0000 | ORAL_TABLET | Freq: Every evening | ORAL | Status: DC | PRN

## 2016-12-15 NOTE — Progress Notes (Signed)
Report attempted 

## 2016-12-15 NOTE — ED Provider Notes (Signed)
[facility] DEPT Provider Note   CSN: [id number] Arrival date & time: 12/15/16  1543     History   Chief Complaint Chief Complaint  Patient presents with  . Altered Mental Status    HPI Deborah [name] is a 71 y.o. female.  HPI Patient presents to the emergency department with altered mental status and difficulty finding words over the last 2 weeks.  This seems a bit worse in the last 2 days.  She did see her primary doctor was unable to recall why she saw them.  She called her son who was informing her of what they saw the primary doctor for.  The patient states that she has been having worsening headaches over the last 2 days as well.  Patient states nothing seems make the condition better or worseThe patient denies chest pain, shortness of breath,blurred vision, neck pain, fever, cough, weakness, numbness, dizziness, anorexia, edema, abdominal pain, nausea, vomiting, diarrhea, rash, back pain, dysuria, hematemesis, bloody stool, near syncope, or syncope. Past Medical History:  Diagnosis Date  . Allergy   . Anemia   . Anxiety   . Arthritis   . Depression   . Hyperlipidemia   . Hypertension   . Migraines   . Prediabetes   . RLS (restless legs syndrome)   . Vitamin D deficiency     Patient Active Problem List   Diagnosis Date Noted  . Medicare annual wellness visit, subsequent 03/12/2015  . BMI 26.0-26.9,adult 03/12/2015  . Skin cancer 08/06/2014  . Migraine 08/06/2014  . Generalized anxiety disorder 08/06/2014  . Incontinence in female 08/06/2014  . Prediabetes 07/01/2013  . Medication management 07/01/2013  . Hypertension   . Hyperlipidemia   . Vitamin D deficiency     Past Surgical History:  Procedure Laterality Date  . KNEE ARTHROSCOPY Bilateral 04/2014   [facility]  . MOHS SURGERY Right 04/2013   Squamous cell cacinoma  . SPINE SURGERY  2010   C6-C7 fusion    OB History    No data available       Home Medications    Prior to Admission  medications   Medication Sig Start Date End Date Taking? Authorizing Provider  ALPRAZolam (XANAX XR) 2 MG 24 hr tablet Take 1 tablet every morning for anxiety attacks 11/28/16   Unk Pinto, MD  B Complex Vitamins (VITAMIN-B COMPLEX PO) Take 1 capsule by mouth daily.    [provider]  BIOTIN PO Take by mouth. Patient takes 10000 mcg plus Keratin 1 daily    [provider]  Cholecalciferol (VITAMIN D PO) Take 5,000 Units by mouth daily.    [provider]  ezetimibe (ZETIA) 10 MG tablet Take 1 tablet daily for Cholesterol 02/21/16 08/20/16  Unk Pinto, MD  famotidine (PEPCID) 40 MG tablet Take 40 mg by mouth. 11/20/16 12/20/16  [provider]  fluticasone (FLONASE) 50 MCG/ACT nasal spray Place 2 sprays into both nostrils at bedtime. 09/17/15   Vicie Mutters, PA-C  rosuvastatin (CRESTOR) 20 MG tablet Take 1 tablet (20 mg total) by mouth daily. 11/09/16   Unk Pinto, MD  SUMAtriptan (IMITREX) 20 MG/ACT nasal spray USE ONE DOSE IN THE NOSE NOW MAY REPEAT IN 2 HOURS, MAX 2 DOSES IN 24 HOURS 10/17/16   Unk Pinto, MD  topiramate (TOPAMAX) 100 MG tablet Take 1 tablet 2 x day for Headache Prevention 01/04/16 01/03/17  Unk Pinto, MD    Family History Family History  Problem Relation Age of Onset  . Diabetes  Mother   . Heart disease Mother   . Heart disease Father   . Stroke Father   . Hypertension Father   . Hyperlipidemia Sister   . Hypertension Brother     Social History Social History  Substance Use Topics  . Smoking status: Former Smoker    Packs/day: 1.00    Years: 4.00    Types: Cigarettes    Quit date: 03/25/1986  . Smokeless tobacco: Never Used  . Alcohol use Yes     Comment: rare     Allergies   Banana; Effexor [venlafaxine]; Maxalt [rizatriptan benzoate]; Tussin [guaifenesin]; and Zostavax [zoster vaccine live]   Review of Systems Review of Systems All other systems negative except as documented in the HPI. All  pertinent positives and negatives as reviewed in the HPI.  Physical Exam Updated Vital Signs There were no vitals taken for this visit.  Physical Exam  Constitutional: She is oriented to person, place, and time. She appears well-developed and well-nourished. No distress.  HENT:  Head: Normocephalic and atraumatic.  Mouth/Throat: Oropharynx is clear and moist.  Eyes: Pupils are equal, round, and reactive to light.  Neck: Normal range of motion. Neck supple.  Cardiovascular: Normal rate, regular rhythm and normal heart sounds.  Exam reveals no gallop and no friction rub.   No murmur heard. Pulmonary/Chest: Effort normal and breath sounds normal. No respiratory distress. She has no wheezes.  Abdominal: Soft. Bowel sounds are normal. She exhibits no distension. There is no tenderness.  Neurological: She is alert and oriented to person, place, and time. No sensory deficit. She exhibits normal muscle tone. Coordination normal.  Patient does appear to be having difficulty recalling recent events, along with the fact that she cannot come up certain words.  Patient does not have any focal deficits noted on exam  Skin: Skin is warm and dry. Capillary refill takes less than 2 seconds. No rash noted. No erythema.  Psychiatric: She has a normal mood and affect. Her behavior is normal.  Nursing note and vitals reviewed.    ED Treatments / Results  Labs (all labs ordered are listed, but only abnormal results are displayed) Labs Reviewed  COMPREHENSIVE METABOLIC PANEL - Abnormal; Notable for the following:       Result Value   CO2 21 (*)    Glucose, Bld 108 (*)    Creatinine, Ser 1.02 (*)    GFR calc non Af Amer 54 (*)    All other components within normal limits  URINALYSIS, ROUTINE W REFLEX MICROSCOPIC - Abnormal; Notable for the following:    Color, Urine STRAW (*)    Ketones, ur 5 (*)    All other components within normal limits  CBC WITH DIFFERENTIAL/PLATELET    EKG  EKG  Interpretation None       Radiology Ct Head Wo Contrast  Result Date: 12/15/2016 CLINICAL DATA:  71 year old female with altered level of consciousness. EXAM: CT HEAD WITHOUT CONTRAST TECHNIQUE: Contiguous axial images were obtained from the base of the skull through the vertex without intravenous contrast. COMPARISON:  02/22/2012 head CT FINDINGS: Brain: No evidence of acute infarction, hemorrhage, hydrocephalus, extra-axial collection or mass lesion/mass effect. Chronic small-vessel white matter ischemic changes again noted. Vascular: Intracranial atherosclerotic vascular calcifications noted. Skull: Normal. Negative for fracture or focal lesion. Sinuses/Orbits: No acute finding. Other: None. IMPRESSION: No evidence of acute intracranial abnormality. Chronic small-vessel white matter ischemic changes. Electronically Signed   By: Margarette Canada M.D.   On: 12/15/2016 17:17  Procedures Procedures (including critical care time)  Medications Ordered in ED Medications  sodium chloride 0.9 % bolus 1,000 mL (not administered)  ketorolac (TORADOL) 30 MG/ML injection 30 mg (not administered)  prochlorperazine (COMPAZINE) injection 10 mg (not administered)  SUMAtriptan (IMITREX) injection 6 mg (not administered)     Initial Impression / Assessment and Plan / ED Course  I have reviewed the triage vital signs and the nursing notes.  Pertinent labs & imaging results that were available during my care of the patient were reviewed by me and considered in my medical decision making (see chart for details).     atient will need admission to the hospital for further evaluation and possible stroke workup for this word finding and she along with her altered mental status from her baseline.  Patient is advised of the plan and all questions were answered.  She is having significant headache here in the emergency department at this time.   Final Clinical Impressions(s) / ED Diagnoses   Final diagnoses:    None    New Prescriptions New Prescriptions   No medications on file     Rebeca Allegra 12/15/16 Glenard Haring, MD 12/16/16 [phone]

## 2016-12-15 NOTE — ED Notes (Signed)
Attempted to call report

## 2016-12-15 NOTE — H&P (Signed)
Deborah [name] JJH:[id number] DOB: 10-09-1945 DOA: 12/15/2016     PCP: Unk Pinto, MD   Outpatient Specialists: Morene Rankins Patient coming from:    home Lives with husband    Chief Complaint: progressive short term memory loss and word finding dificulty and the worsening headaches  HPI: Deborah [name] is a 71 y.o. female with medical history significant of HTN, HLD, anxiety,  Prediabetes, depression vitamin D deficiency. Migraine headaches    Presented with 2 week history of progressive worsening confusion and short-term memory problems word finding difficulty Sr. became progressively worse the past 2 days. For example she went to see her PCP but now can remember why she had to call her son to figure out why she had to go see her primary care doctor. She's been having worsening headaches she has chronic migraines  And they have been increasing. Marland Kitchen No chest pain shortness of breath no fevers or chills no localized neurological deficits syncope or presyncope.    Regarding pertinent Chronic problems: Recently treated for H. pylori by GI History of an anxiety for which she recently been treated with Klonopin History of migraine headaches on Topamax for prevention and sumatriptan as needed   IN ER:     Latest HR 78 BP 110/76 Naq 138 k 3.7 bicarb 21 cr 1.02 slightly up from baseline of 0.8 Anion gap 8  WBC 6.2 Hg 13.5  LFT WNL  CT head  Non -acute Following Medications were ordered in ER: Medications  sodium chloride 0.9 % bolus 1,000 mL (1,000 mLs Intravenous New Bag/Given 12/15/16 1921)  ketorolac (TORADOL) 30 MG/ML injection 30 mg (30 mg Intravenous Given 12/15/16 1942)  prochlorperazine (COMPAZINE) injection 10 mg (10 mg Intravenous Given 12/15/16 1942)  SUMAtriptan (IMITREX) injection 6 mg (6 mg Subcutaneous Given 12/15/16 1915)     ER provider discussed case with:  Dr Aroor Who recommends: Admit for CVA work up [employer] see patient in consult tonight    Hospitalist was  called for admission for Progressive mental status changes  Review of Systems:    Pertinent positives include: fatigue, confusion headaches nausea, vomiting, Constitutional:  No weight loss, night sweats, Fevers, chills, weight loss  HEENT:  No , Difficulty swallowing,Tooth/dental problems,Sore throat,  No sneezing, itching, ear ache, nasal congestion, post nasal drip,  Cardio-vascular:  No chest pain, Orthopnea, PND, anasarca, dizziness, palpitations.no Bilateral lower extremity swelling  GI:  No heartburn, indigestion, abdominal pain, diarrhea, change in bowel habits, loss of appetite, melena, blood in stool, hematemesis Resp:  no shortness of breath at rest. No dyspnea on exertion, No excess mucus, no productive cough, No non-productive cough, No coughing up of blood.No change in color of mucus.No wheezing. Skin:  no rash or lesions. No jaundice GU:  no dysuria, change in color of urine, no urgency or frequency. No straining to urinate.  No flank pain.  Musculoskeletal:  No joint pain or no joint swelling. No decreased range of motion. No back pain.  Psych:  No change in mood or affect. No depression or anxiety. No memory loss.  Neuro: no localizing neurological complaints, no tingling, no weakness, no double vision, no gait abnormality, no slurred speech, no confusion  As per HPI otherwise 10 point review of systems negative.   Past Medical History: Past Medical History:  Diagnosis Date  . Allergy   . Anemia   . Anxiety   . Arthritis   . Depression   . Hyperlipidemia   . Hypertension   .  Migraines   . Prediabetes   . RLS (restless legs syndrome)   . Vitamin D deficiency    Past Surgical History:  Procedure Laterality Date  . KNEE ARTHROSCOPY Bilateral 04/2014   [facility]  . MOHS SURGERY Right 04/2013   Squamous cell cacinoma  . SPINE SURGERY  2010   C6-C7 fusion     Social History:  Ambulatory  independently      reports that she quit smoking  about 30 years ago. Her smoking use included Cigarettes. She has a 4.00 pack-year smoking history. She has never used smokeless tobacco. She reports that she drinks alcohol. She reports that she does not use drugs.  Allergies:   Allergies  Allergen Reactions  . Banana     Nausea and vomiting  . Effexor [Venlafaxine]     insomnia  . Maxalt [Rizatriptan Benzoate] Other (See Comments)    Nausea and excessive salivation  . Tussin [Guaifenesin]     rash  . Zostavax [Zoster Vaccine Live]        Family History:   Family History  Problem Relation Age of Onset  . Diabetes Mother   . Heart disease Mother   . Heart disease Father   . Stroke Father   . Hypertension Father   . Hyperlipidemia Sister   . Hypertension Brother     Medications: Prior to Admission medications   Medication Sig Start Date End Date Taking? Authorizing Provider  ALPRAZolam (XANAX XR) 2 MG 24 hr tablet Take 1 tablet every morning for anxiety attacks 11/28/16   Unk Pinto, MD  B Complex Vitamins (VITAMIN-B COMPLEX PO) Take 1 capsule by mouth daily.    [provider]  BIOTIN PO Take by mouth. Patient takes 10000 mcg plus Keratin 1 daily    [provider]  Cholecalciferol (VITAMIN D PO) Take 5,000 Units by mouth daily.    [provider]  ezetimibe (ZETIA) 10 MG tablet Take 1 tablet daily for Cholesterol 02/21/16 08/20/16  Unk Pinto, MD  famotidine (PEPCID) 40 MG tablet Take 40 mg by mouth. 11/20/16 12/20/16  [provider]  fluticasone (FLONASE) 50 MCG/ACT nasal spray Place 2 sprays into both nostrils at bedtime. 09/17/15   Vicie Mutters, PA-C  rosuvastatin (CRESTOR) 20 MG tablet Take 1 tablet (20 mg total) by mouth daily. 11/09/16   Unk Pinto, MD  SUMAtriptan (IMITREX) 20 MG/ACT nasal spray USE ONE DOSE IN THE NOSE NOW MAY REPEAT IN 2 HOURS, MAX 2 DOSES IN 24 HOURS 10/17/16   Unk Pinto, MD  topiramate (TOPAMAX) 100 MG tablet Take 1 tablet 2 x day for Headache  Prevention 01/04/16 01/03/17  Unk Pinto, MD    Physical Exam: No data found.   1. General:  in No Acute distress  well  - appearing 2. Psychological: Alert and  Oriented                           Short term memory deficits unable to remember 2 out of 3 objects                           Word finding difficulty unable to recollect the name for wash clothe 3. Head/ENT:     Dry Mucous Membranes                          Head Non traumatic, neck supple  Normal  Dentition 4. SKIN:  decreased Skin turgor,  Skin clean Dry and intact no rash 5. Heart: Regular rate and rhythm no  Murmur, no Rub or gallop 6. Lungs:   no wheezes or crackles   7. Abdomen: Soft,  non-tender, Non distended  bowel sounds present 8. Lower extremities: no clubbing, cyanosis, or edema 9. Neurologically   strength 5 out of 5 in all 4 extremities cranial nerves II through XII intact, Nastagmus noted 10. MSK: Normal range of motion   body mass index is unknown because there is no height or weight on file.  Labs on Admission:   Labs on Admission: I have personally reviewed following labs and imaging studies  CBC:  Recent Labs Lab 12/15/16 1710  WBC 6.2  NEUTROABS 3.9  HGB 13.5  HCT 40.2  MCV 88.9  PLT 161   Basic Metabolic Panel:  Recent Labs Lab 12/15/16 1710  NA 138  K 3.7  CL 109  CO2 21*  GLUCOSE 108*  BUN 11  CREATININE 1.02*  CALCIUM 9.6   GFR: Estimated Creatinine Clearance: 48 mL/min (A) (by C-G formula based on SCr of 1.02 mg/dL (H)). Liver Function Tests:  Recent Labs Lab 12/15/16 1710  AST 25  ALT 14  ALKPHOS 63  BILITOT 0.6  PROT 7.3  ALBUMIN 4.1   No results for input(s): LIPASE, AMYLASE in the last 168 hours. No results for input(s): AMMONIA in the last 168 hours. Coagulation Profile: No results for input(s): INR, PROTIME in the last 168 hours. Cardiac Enzymes: No results for input(s): CKTOTAL, CKMB, CKMBINDEX, TROPONINI in the last 168  hours. BNP (last 3 results) No results for input(s): PROBNP in the last 8760 hours. HbA1C: No results for input(s): HGBA1C in the last 72 hours. CBG: No results for input(s): GLUCAP in the last 168 hours. Lipid Profile: No results for input(s): CHOL, HDL, LDLCALC, TRIG, CHOLHDL, LDLDIRECT in the last 72 hours. Thyroid Function Tests: No results for input(s): TSH, T4TOTAL, FREET4, T3FREE, THYROIDAB in the last 72 hours. Anemia Panel: No results for input(s): VITAMINB12, FOLATE, FERRITIN, TIBC, IRON, RETICCTPCT in the last 72 hours. Urine analysis:    Component Value Date/Time   COLORURINE STRAW (A) 12/15/2016 1757   APPEARANCEUR CLEAR 12/15/2016 1757   LABSPEC 1.008 12/15/2016 1757   PHURINE 7.0 12/15/2016 1757   GLUCOSEU NEGATIVE 12/15/2016 1757   HGBUR NEGATIVE 12/15/2016 1757   BILIRUBINUR NEGATIVE 12/15/2016 1757   KETONESUR 5 (A) 12/15/2016 1757   PROTEINUR NEGATIVE 12/15/2016 1757   UROBILINOGEN 0.2 10/12/2014 1029   NITRITE NEGATIVE 12/15/2016 1757   LEUKOCYTESUR NEGATIVE 12/15/2016 1757   Sepsis Labs: @LABRCNTIP (procalcitonin:4,lacticidven:4) )No results found for this or any previous visit (from the past 240 hour(s)).     UA  no evidence of UTI   Lab Results  Component Value Date   HGBA1C 5.6 10/26/2016    Estimated Creatinine Clearance: 48 mL/min (A) (by C-G formula based on SCr of 1.02 mg/dL (H)).  BNP (last 3 results) No results for input(s): PROBNP in the last 8760 hours.   ECG REPORT  Independently reviewed Rate: 95  Rhythm: NSR with bi atrial enlargement ST&T Change: No acute ischemic changes  QTC 462  There were no vitals filed for this visit.   Cultures: No results found for: SDES, SPECREQUEST, CULT, REPTSTATUS   Radiological Exams on Admission: Ct Head Wo Contrast  Result Date: 12/15/2016 CLINICAL DATA:  71 year old female with altered level of consciousness. EXAM: CT HEAD WITHOUT CONTRAST TECHNIQUE: Contiguous  axial images were obtained  from the base of the skull through the vertex without intravenous contrast. COMPARISON:  02/22/2012 head CT FINDINGS: Brain: No evidence of acute infarction, hemorrhage, hydrocephalus, extra-axial collection or mass lesion/mass effect. Chronic small-vessel white matter ischemic changes again noted. Vascular: Intracranial atherosclerotic vascular calcifications noted. Skull: Normal. Negative for fracture or focal lesion. Sinuses/Orbits: No acute finding. Other: None. IMPRESSION: No evidence of acute intracranial abnormality. Chronic small-vessel white matter ischemic changes. Electronically Signed   By: Margarette Canada M.D.   On: 12/15/2016 17:17    Chart has been reviewed    Assessment/Plan  71 y.o. female with medical history significant of HTN, HLD, anxiety,  Prediabetes, depression vitamin D deficiency. Migraine headaches  Admitted for Progressive mental status changes    Present on Admission: . TIA (transient ischemic attack) - - will admit based on TIA/CVA protocol, await results of MRA/MRI, Carotid Doppler and Echo, obtain cardiac enzymes,  ECG,   Lipid panel, TSH. Order PT/OT evaluation. Will make sure patient is on antiplatelet agent.   Neurology consulted.      . Hyperlipidemia - stable continue home medications order lipid panel . Hypertension - reports have been having soft BP at home, not on antihypertensive medication . Prediabetes - check HgA1c TSH . Generalized anxiety disorder - continue xanax PRN . Acute encephalopathy - somewhat atypical given progressive symptoms over the past 2 weeks but severe worsening over 2 days. Order MRI, check TSH, RPR, B 12    Other plan as per orders.  DVT prophylaxis:  SCD       Code Status:  FULL CODE as per patient    Family Communication:   Family   at  Bedside  plan of care was discussed with  Sister,    Disposition Plan:       To home once workup is complete and patient is stable                               Consults called:  Neurology    Admission status:    obs   Level of care     Tele      I have spent a total of 66 min on this admission   extra time was spent to discuss case with consultants  [place] 12/15/2016, 9:07 PM    [facility]  Pager [phone]   after 2 AM please page floor coverage PA If 7AM-7PM, please contact the day team taking care of the patient  [email or web address]  Password [email or web address]

## 2016-12-15 NOTE — ED Notes (Signed)
Unable to get temp at this moment. Pt is drinking water.

## 2016-12-16 ENCOUNTER — Observation Stay (HOSPITAL_COMMUNITY): Payer: Medicare Other

## 2016-12-16 DIAGNOSIS — I680 Cerebral amyloid angiopathy: Secondary | ICD-10-CM | POA: Diagnosis not present

## 2016-12-16 DIAGNOSIS — G9340 Encephalopathy, unspecified: Secondary | ICD-10-CM | POA: Diagnosis not present

## 2016-12-16 DIAGNOSIS — G43909 Migraine, unspecified, not intractable, without status migrainosus: Secondary | ICD-10-CM | POA: Diagnosis not present

## 2016-12-16 DIAGNOSIS — F411 Generalized anxiety disorder: Secondary | ICD-10-CM | POA: Diagnosis not present

## 2016-12-16 DIAGNOSIS — R4701 Aphasia: Secondary | ICD-10-CM | POA: Diagnosis not present

## 2016-12-16 DIAGNOSIS — R410 Disorientation, unspecified: Secondary | ICD-10-CM

## 2016-12-16 DIAGNOSIS — G459 Transient cerebral ischemic attack, unspecified: Secondary | ICD-10-CM | POA: Diagnosis not present

## 2016-12-16 DIAGNOSIS — E854 Organ-limited amyloidosis: Secondary | ICD-10-CM | POA: Diagnosis not present

## 2016-12-16 DIAGNOSIS — E785 Hyperlipidemia, unspecified: Secondary | ICD-10-CM | POA: Diagnosis not present

## 2016-12-16 LAB — BASIC METABOLIC PANEL
Anion gap: 8 (ref 5–15)
BUN: 13 mg/dL (ref 6–20)
CALCIUM: 8.9 mg/dL (ref 8.9–10.3)
CO2: 20 mmol/L — ABNORMAL LOW (ref 22–32)
CREATININE: 0.84 mg/dL (ref 0.44–1.00)
Chloride: 112 mmol/L — ABNORMAL HIGH (ref 101–111)
Glucose, Bld: 102 mg/dL — ABNORMAL HIGH (ref 65–99)
Potassium: 3.3 mmol/L — ABNORMAL LOW (ref 3.5–5.1)
SODIUM: 140 mmol/L (ref 135–145)

## 2016-12-16 LAB — VITAMIN B12: VITAMIN B 12: 695 pg/mL (ref 180–914)

## 2016-12-16 LAB — TROPONIN I

## 2016-12-16 LAB — LIPID PANEL
CHOLESTEROL: 178 mg/dL (ref 0–200)
HDL: 61 mg/dL (ref >40)
LDL Cholesterol: 102 mg/dL — ABNORMAL HIGH (ref 0–99)
TRIGLYCERIDES: 74 mg/dL (ref <150)
Total CHOL/HDL Ratio: 2.9 RATIO
VLDL: 15 mg/dL (ref 0–40)

## 2016-12-16 LAB — HEMOGLOBIN A1C
HEMOGLOBIN A1C: 5.6 % (ref 4.8–5.6)
MEAN PLASMA GLUCOSE: 114.02 mg/dL

## 2016-12-16 LAB — AMMONIA: AMMONIA: 21 umol/L (ref 9–35)

## 2016-12-16 LAB — RPR: RPR Ser Ql: NONREACTIVE

## 2016-12-16 LAB — HIV ANTIBODY (ROUTINE TESTING W REFLEX): HIV Screen 4th Generation wRfx: NONREACTIVE

## 2016-12-16 MED ORDER — DIVALPROEX SODIUM 500 MG PO DR TAB: 500.0000 mg | DELAYED_RELEASE_TABLET | Freq: Two times a day (BID) | ORAL | 3 refills | Status: DC

## 2016-12-16 MED ORDER — DIVALPROEX SODIUM 500 MG PO DR TAB
500.0000 mg | DELAYED_RELEASE_TABLET | Freq: Two times a day (BID) | ORAL | 3 refills | Status: DC
Start: 2016-12-16 — End: 2016-12-25

## 2016-12-16 MED ORDER — LORAZEPAM 2 MG/ML IJ SOLN
2.0000 mg | Freq: Once | INTRAMUSCULAR | Status: AC
  Administered 2016-12-16: 2 mg via INTRAVENOUS
  Filled 2016-12-16: qty 1

## 2016-12-16 MED ORDER — UNABLE TO FIND
0 refills | Status: DC
Start: 2016-12-16 — End: 2017-05-08

## 2016-12-16 MED ORDER — POTASSIUM CHLORIDE CRYS ER 20 MEQ PO TBCR
40.0000 meq | EXTENDED_RELEASE_TABLET | Freq: Once | ORAL | Status: AC
  Administered 2016-12-16: 40 meq via ORAL
  Filled 2016-12-16: qty 2

## 2016-12-16 MED ORDER — DIVALPROEX SODIUM 500 MG PO DR TAB
500.0000 mg | DELAYED_RELEASE_TABLET | Freq: Two times a day (BID) | ORAL | Status: DC
  Administered 2016-12-16: 500 mg via ORAL
  Filled 2016-12-16: qty 1

## 2016-12-16 NOTE — Progress Notes (Signed)
Neurology MD paged if they would be ordering Ativan for the pt to get the MRI attempted. Will continue to monitor.

## 2016-12-16 NOTE — Progress Notes (Signed)
Pt arrived room in the presence of ED tech and sister. Pt AxOx4. No complaints of Headache or pain. Pt oriented to room. Vital signs completed. Will continue to monitor.

## 2016-12-16 NOTE — Care Management Note (Signed)
Case Management Note  Patient Details  Name: Deborah [name] MRN: [id number] Date of Birth: December 23, 1945  Subjective/Objective: 71 Y.O. f TO BE DISCHarged with Outpt PT for Balance. Referral sent to Neuro rehab. Called and spoke with pt via phone and made her aware someone would call her to make arrangements for initial visit.                     Action/Plan:CM will sign off for now but will be available should additional discharge needs arise or disposition change.    Expected Discharge Date:  12/16/16               Expected Discharge Plan:     In-House Referral:     Discharge planning Services  CM Consult  Post Acute Care Choice:  NA Choice offered to:  Patient  DME Arranged:  N/A DME Agency:     HH Arranged:    [place] Agency:     Status of Service:  Completed, signed off  If discussed at [place] of Stay Meetings, dates discussed:    Additional Comments:  Delrae Sawyers, RN 12/16/2016, 11:04 AM

## 2016-12-16 NOTE — Evaluation (Signed)
Physical Therapy Evaluation Patient Details Name: Deborah [name] MRN: [id number] DOB: 1946-03-19 Today's Date: 12/16/2016   History of Present Illness  Pt is a 71 y/o female admitted secondary to word finding difficulty and worsening short-term memory. Pt diagnosed with cerebral amyloid angiopathy. PMH including but not limited to HTN, HLD, anxiety and headaches.  Clinical Impression  Pt presented supine in bed with HOB elevated, awake and willing to participate in therapy session. Prior to admission, pt reported that she ambulates mostly independently but will use an AD PRN. Pt lives with her husband in a single level home with no stairs to enter. Pt ambulated in hallway without use of an AD with constant min A secondary to instability. Pt would continue to benefit from skilled physical therapy services at this time while admitted and after d/c to address the below listed limitations in order to improve overall safety and independence with functional mobility.     Follow Up Recommendations Outpatient PT;Other (comment) (OP PT for balance)    Equipment Recommendations  None recommended by PT;Other (comment) (pt has all necessary DME at home)    Recommendations for Other Services       Precautions / Restrictions Precautions Precautions: Fall Restrictions Weight Bearing Restrictions: No      Mobility  Bed Mobility Overal bed mobility: Modified Independent             General bed mobility comments: increased time  Transfers Overall transfer level: Needs assistance Equipment used: None Transfers: Sit to/from Stand Sit to Stand: Supervision         General transfer comment: increased time, pt with LOB posteriorly upon initially standing and sat back down on bed, successful second attempt; supervision for safety  Ambulation/Gait Ambulation/Gait assistance: Min assist Ambulation Distance (Feet): 75 Feet Assistive device: None Gait Pattern/deviations: Step-through  pattern;Decreased step length - right;Decreased step length - left;Decreased stride length Gait velocity: decreased Gait velocity interpretation: Below normal speed for age/gender General Gait Details: modest instability requiring min A without use of an AD; pt with LOB x1 when returning to room and attempting to sit back down EOB  Stairs            Wheelchair Mobility    Modified Rankin (Stroke Patients Only)       Balance Overall balance assessment: Needs assistance Sitting-balance support: Feet supported;No upper extremity supported Sitting balance-Leahy Scale: Good     Standing balance support: During functional activity;No upper extremity supported Standing balance-Leahy Scale: Poor                               Pertinent Vitals/Pain Pain Assessment: No/denies pain    Home Living Family/patient expects to be discharged to:: Private residence Living Arrangements: Spouse/significant other Available Help at Discharge: Family;Available 24 hours/day Type of Home: House Home Access: Level entry     Home Layout: One level Home Equipment: Clinical cytogeneticist - 2 wheels;Cane - single point      Prior Function Level of Independence: Independent with assistive device(s)         Comments: SPC/RW PRN     Hand Dominance   Dominant Hand: Right    Extremity/Trunk Assessment   Upper Extremity Assessment Upper Extremity Assessment: Overall WFL for tasks assessed    Lower Extremity Assessment Lower Extremity Assessment: Overall WFL for tasks assessed       Communication   Communication: No difficulties  Cognition Arousal/Alertness: Awake/alert Behavior During Therapy:  WFL for tasks assessed/performed Overall Cognitive Status: Impaired/Different from baseline Area of Impairment: Memory;Safety/judgement                     Memory: Decreased short-term memory   Safety/Judgement: Decreased awareness of safety;Decreased awareness of  deficits            General Comments      Exercises     Assessment/Plan    PT Assessment Patient needs continued PT services  PT Problem List Decreased balance;Decreased mobility;Decreased coordination;Decreased cognition;Decreased safety awareness       PT Treatment Interventions DME instruction;Gait training;Stair training;Functional mobility training;Therapeutic activities;Therapeutic exercise;Balance training;Neuromuscular re-education;Cognitive remediation;Patient/family education    PT Goals (Current goals can be found in the Care Plan section)  Acute Rehab PT Goals Patient Stated Goal: return home today PT Goal Formulation: With patient Time For Goal Achievement: 12/30/16 Potential to Achieve Goals: Good    Frequency Min 3X/week   Barriers to discharge        Co-evaluation               AM-PAC PT "6 Clicks" Daily Activity  Outcome Measure Difficulty turning over in bed (including adjusting bedclothes, sheets and blankets)?: None Difficulty moving from lying on back to sitting on the side of the bed? : None Difficulty sitting down on and standing up from a chair with arms (e.g., wheelchair, bedside commode, etc,.)?: A Little Help needed moving to and from a bed to chair (including a wheelchair)?: A Little Help needed walking in hospital room?: A Little Help needed climbing 3-5 steps with a railing? : A Little 6 Click Score: 20    End of Session Equipment Utilized During Treatment: Gait belt Activity Tolerance: Patient tolerated treatment well Patient left: in bed;with call bell/phone within reach;with bed alarm set Nurse Communication: Mobility status PT Visit Diagnosis: Other abnormalities of gait and mobility (R26.89)    Time: 0973-5329 PT Time Calculation (min) (ACUTE ONLY): 16 min   Charges:   PT Evaluation $PT Eval Moderate Complexity: 1 Mod     PT G Codes:   PT G-Codes **NOT FOR INPATIENT CLASS** Functional Assessment Tool Used: AM-PAC  6 Clicks Basic Mobility;Clinical judgement Functional Limitation: Mobility: Walking and moving around Mobility: Walking and Moving Around Current Status (J2426): At least 20 percent but less than 40 percent impaired, limited or restricted Mobility: Walking and Moving Around Goal Status [phone]): 0 percent impaired, limited or restricted    [facility], [place], DPT [place] 12/16/2016, 9:35 AM

## 2016-12-16 NOTE — Progress Notes (Signed)
Subjective: Feels much better, back to baseline.   Exam: Vitals:   12/16/16 0156 12/16/16 0630  BP: 138/86 130/70  Pulse: 96 99  Resp: 16 18  Temp: 98.3 F (36.8 C) 98.4 F (36.9 C)  SpO2: 98% 100%   Gen: In bed, NAD Resp: non-labored breathing, no acute distress Abd: soft, nt  Neuro: MS: Awake, alert, she has increased latency of speech and some difficulty with word finding.  AT:FTDDU, EOMI Motor: Moves all extremities well.  Sensory:intact to LT  Pertinent Labs: BMP - unremarkable  Impression: 71 yo F with cerebral amyloid angiopathy.   I suspect that her progressive aphasia may be symptoms of a neurodegenerative condition. I think that topamax may be contributing to her word finding difficulty and would favor changing to depakote for migraine prevention. The largest of her micro hemorrhages is in a very epileptogenic area and the episode of confusion yesterday could have been a complex partial seizure, though with her headache, confusional migraine would also be a strong possibility. Depakote would address either possibility, but at this time, I feel that confusional migraine is more likely.   I think an EEG would be helpful in the long run, but would not keep her hospitalized for this as it will not change management at this time.   Recommendations: 1) Discontinue topamax 2) Start depakote 500mg  BID 3) She will need outpatient neuro follow up, I have requested this.  4) No stroke workup is needed.  5) Please call with any further   Roland Rack, MD [facility] [phone]  If 7pm- 7am, please page neurology on call as listed in [place].

## 2016-12-16 NOTE — Discharge Summary (Signed)
Physician Discharge Summary   Patient ID: Deborah [name] MRN: [id number] DOB/AGE: May 30, 1945 71 y.o.  Admit date: 12/15/2016 Discharge date: 12/16/2016  Primary Care Physician:  Unk Pinto, MD  Discharge Diagnoses:   . progressive aphasia due to cerebral amyloid angiopathy . Hyperlipidemia . Hypertension . Prediabetes . Generalized anxiety disorder . Acute metabolic encephalopathy    Consults: neurology  Recommendations for Outpatient Follow-up:  1. Outpatient PT 2. Please repeat CBC/BMET at next visit   DIET: heart healthy diet    Allergies:   Allergies  Allergen Reactions  . Banana     Nausea and vomiting  . Effexor [Venlafaxine]     insomnia  . Maxalt [Rizatriptan Benzoate] Other (See Comments)    Nausea and excessive salivation  . Tussin [Guaifenesin]     rash  . Zostavax [Zoster Vaccine Live]      DISCHARGE MEDICATIONS: Current Discharge Medication List    START taking these medications   Details  divalproex (DEPAKOTE) 500 MG DR tablet Take 1 tablet (500 mg total) by mouth every 12 (twelve) hours. Qty: 60 tablet, Refills: 3    UNABLE TO FIND Outpatient physical therapy  Diagnosis: cerebral amyloid angiopathy.gait instability Qty: 1 Mutually Defined, Refills: 0      CONTINUE these medications which have NOT CHANGED   Details  prochlorperazine (COMPAZINE) 10 MG tablet Take 10 mg by mouth every 6 (six) hours as needed for nausea or vomiting.    ALPRAZolam (XANAX XR) 2 MG 24 hr tablet Take 1 tablet every morning for anxiety attacks Qty: 30 tablet, Refills: 0    B Complex Vitamins (VITAMIN-B COMPLEX PO) Take 1 capsule by mouth daily.    BIOTIN PO Take by mouth. Patient takes 10000 mcg plus Keratin 1 daily    Cholecalciferol (VITAMIN D PO) Take 5,000 Units by mouth daily.    ezetimibe (ZETIA) 10 MG tablet Take 1 tablet daily for Cholesterol Qty: 90 tablet, Refills: 1   Associated Diagnoses: Mixed hyperlipidemia    famotidine (PEPCID) 40  MG tablet Take 40 mg by mouth.    fluticasone (FLONASE) 50 MCG/ACT nasal spray Place 2 sprays into both nostrils at bedtime. Qty: 16 g, Refills: 1    rosuvastatin (CRESTOR) 20 MG tablet Take 1 tablet (20 mg total) by mouth daily. Qty: 90 tablet, Refills: 1    SUMAtriptan (IMITREX) 20 MG/ACT nasal spray USE ONE DOSE IN THE NOSE NOW MAY REPEAT IN 2 HOURS, MAX 2 DOSES IN 24 HOURS Qty: 12 Inhaler, Refills: 0      STOP taking these medications     topiramate (TOPAMAX) 100 MG tablet          Brief H and P: For complete details please refer to admission H and P, but in brief the patient is a 71 year old female with hypertension, hyperlipidemia, anxiety, migraine headaches presented with 2 week history of progressive worsening confusion and short-term memory problems, word finding difficulty, became progressively worse the past 2 days. For example she went to see her PCP but  she had to call her son to figure out why she had to go see her primary care doctor. She's been having worsening headaches she has chronic migraines  And they have been increasing. No chest pain shortness of breath no fevers or chills no localized neurological deficits syncope or presyncope.  Hospital Course:   Acute metabolic encephalopathy secondary to cerebral myeloid angiopathy - the patient presented with progressive worsening confusion, short-term memory problems, word findingdifficulty - MRI of the brain  showed diffuse peripheral supratentorial distortion of chronic back hemorrhage, consistent with cerebral amyloid angiopathy, single larger 5 mm focus of chronic hemorrhage with in left lateral temporal lobe. No acute intracranial abnormality - per nephrology, Topamax may be contributing to her word finding difficulty and recommended change to Depakote for migraine prevention - patient was started on Depakote 500 mg twice a day. Ambulatory referral to Neurology sent for follow-up - Outpatient PT, no further stroke  workup needed per neuro   Hyperlipidemia - continue crestor  Prediabetes - hemoglobin A1c 5.6, diet controlled  Generalized anxiety - continue Xanax     Day of Discharge BP 130/70 (BP Location: Right Arm)   Pulse 99   Temp 98.4 F (36.9 C) (Oral)   Resp 18   Ht 5\' 6"  (1.676 m)   Wt 68.1 kg (150 lb 1.6 oz)   SpO2 100%   BMI 24.23 kg/m   Physical Exam: General: Alert and awake oriented x3 not in any acute distress. HEENT: anicteric sclera, pupils reactive to light and accommodation CVS: S1-S2 clear no murmur rubs or gallops Chest: clear to auscultation bilaterally, no wheezing rales or rhonchi Abdomen: soft nontender, nondistended, normal bowel sounds Extremities: no cyanosis, clubbing or edema noted bilaterally Neuro: Cranial nerves II-XII intact, no focal neurological deficits   The results of significant diagnostics from this hospitalization (including imaging, microbiology, ancillary and laboratory) are listed below for reference.    LAB RESULTS: Basic Metabolic Panel:  Recent Labs Lab 12/15/16 1710 12/16/16 0613  NA 138 140  K 3.7 3.3*  CL 109 112*  CO2 21* 20*  GLUCOSE 108* 102*  BUN 11 13  CREATININE 1.02* 0.84  CALCIUM 9.6 8.9   Liver Function Tests:  Recent Labs Lab 12/15/16 1710  AST 25  ALT 14  ALKPHOS 63  BILITOT 0.6  PROT 7.3  ALBUMIN 4.1   No results for input(s): LIPASE, AMYLASE in the last 168 hours.  Recent Labs Lab 12/15/16 2118 12/16/16 0613  AMMONIA 46* 21   CBC:  Recent Labs Lab 12/15/16 1710  WBC 6.2  NEUTROABS 3.9  HGB 13.5  HCT 40.2  MCV 88.9  PLT 262   Cardiac Enzymes:  Recent Labs Lab 12/15/16 2233  TROPONINI <0.03   BNP: Invalid input(s): POCBNP CBG: No results for input(s): GLUCAP in the last 168 hours.  Significant Diagnostic Studies:  Dg Chest 2 View  Result Date: 12/16/2016 CLINICAL DATA:  TIA. EXAM: CHEST  2 VIEW COMPARISON:  Two-view chest x-ray 12/04/2011. FINDINGS: The heart size is  normal. There is no edema or effusion. No focal airspace disease is present. The visualized soft tissues and bony thorax are unremarkable. IMPRESSION: Negative two view chest x-ray Electronically Signed   By: San Morelle M.D.   On: 12/16/2016 07:41   Ct Head Wo Contrast  Result Date: 12/15/2016 CLINICAL DATA:  72 year old female with altered level of consciousness. EXAM: CT HEAD WITHOUT CONTRAST TECHNIQUE: Contiguous axial images were obtained from the base of the skull through the vertex without intravenous contrast. COMPARISON:  02/22/2012 head CT FINDINGS: Brain: No evidence of acute infarction, hemorrhage, hydrocephalus, extra-axial collection or mass lesion/mass effect. Chronic small-vessel white matter ischemic changes again noted. Vascular: Intracranial atherosclerotic vascular calcifications noted. Skull: Normal. Negative for fracture or focal lesion. Sinuses/Orbits: No acute finding. Other: None. IMPRESSION: No evidence of acute intracranial abnormality. Chronic small-vessel white matter ischemic changes. Electronically Signed   By: Margarette Canada M.D.   On: 12/15/2016 17:17   Mr Brain Lottie Dawson  Contrast  Result Date: 12/16/2016 CLINICAL DATA:  71 y/o F; 2 weeks of worsening confusion and short-term memory problems with word-finding difficulty. Also worsening headaches. EXAM: MRI HEAD WITHOUT CONTRAST TECHNIQUE: Multiplanar, multiecho pulse sequences of the brain and surrounding structures were obtained without intravenous contrast. COMPARISON:  12/15/2016 CT of the head FINDINGS: Brain: No acute infarction, hemorrhage, hydrocephalus, extra-axial collection or mass lesion. Numerous foci of susceptibility hypointensity are present predominantly at the peripheries of the supratentorial brain compatible with hemosiderin deposition of chronic microhemorrhage. Additionally, there is a larger focus is in the left posterolateral temporal lobe measuring 5 mm. Patchy nonspecific foci of T2 FLAIR hyperintense  signal abnormality in subcortical and periventricular white matter is compatible with moderate chronic microvascular ischemic changes for age. Moderate brain parenchymal volume loss. Vascular: Normal flow voids. Skull and upper cervical spine: Normal marrow signal. Sinuses/Orbits: Left frontal sinus opacification and mild ethmoid air cell mucosal thickening. No abnormal signal of mastoid air cells. Bilateral intra-ocular lens replacement. Other: None. IMPRESSION: 1. Diffuse peripheral supratentorial distribution of chronic microhemorrhage. The pattern is consistent with cerebral amyloid angiopathy and is atypical in distribution for chronic hypertensive encephalopathy. There is a single larger 5 mm focus of chronic hemorrhage within left lateral temporal lobe. 2. Moderate chronic microvascular ischemic changes and moderate parenchymal volume loss of the brain. 3. No acute intracranial abnormality identified. Electronically Signed   By: Kristine Garbe M.D.   On: 12/16/2016 05:37    2D ECHO:   Disposition and Follow-up: Discharge Instructions    Ambulatory referral to Neurology    Complete by:  As directed    An appointment is requested in approximately: 2-4 weeks   Diet - low sodium heart healthy    Complete by:  As directed    Increase activity slowly    Complete by:  As directed        DISPOSITION: home    [place]    Unk Pinto, MD. Schedule an appointment as soon as possible for a visit in 2 week(s).   Specialty:  Internal Medicine Contact information: [address] [place] [address] [phone]            Time spent on Discharge: 75mins   Signed:   Estill Cotta M.D. [facility] 12/16/2016, 10:12 AM Pager: [phone]

## 2016-12-16 NOTE — Consult Note (Addendum)
Neurology Consultation Reason for Consult: memory problems Referring Physician: Dr. Roel Cluck  CC: Word finding difficulty  History is obtained from: Patient and chart review  HPI: Deborah [name] is a 71 y.o. female past medical history of hypertension, hyperlipidemia, prediabetes, chronic migraines on Topamax, anxiety who presents to the emergency room for word finding difficulty and confusion. He states that she will up yesterday morning with a mild headache, and felt very confused. She did not sleep well the night before. She got up in the morning and went to the grocery store and forgot to keep the dog in her crate before leaving. On returning from the grocery store, she felt very confused and as I felt that she could not understand conversation on the phone with her son. She states that she immediately went to her neighbors to call for help and was not able to completely understand her neighbor for she able to express herself.  These that she has been complaining or finding difficulty and has had increased problems for over a year. She hasn't mentioned this to her PCP however this has not been worked up. He also says that she has been having worsening headaches over the last few weeks  ROS: A 14 point ROS was performed and is negative except as noted in the HPI.   Past Medical History:  Diagnosis Date  . Allergy   . Anemia   . Anxiety   . Arthritis   . Depression   . Hyperlipidemia   . Hypertension   . Migraines   . Prediabetes   . RLS (restless legs syndrome)   . Vitamin D deficiency      Family History  Problem Relation Age of Onset  . Diabetes Mother   . Heart disease Mother   . Heart disease Father   . Stroke Father   . Hypertension Father   . Hyperlipidemia Sister   . Hypertension Brother      Social History:  reports that she quit smoking about 30 years ago. Her smoking use included Cigarettes. She has a 4.00 pack-year smoking history. She has never used smokeless  tobacco. She reports that she drinks alcohol. She reports that she does not use drugs.   Exam: Current vital signs: BP 140/90 (BP Location: Left Arm)   Pulse 98   Temp 98.2 F (36.8 C) (Oral)   Resp 18   Ht 5\' 6"  (1.676 m)   Wt 68.1 kg (150 lb 1.6 oz)   SpO2 95%   BMI 24.23 kg/m  Vital signs in last 24 hours: Temp:  [98.1 F (36.7 C)-98.2 F (36.8 C)] 98.2 F (36.8 C) (09/01 0019) Pulse Rate:  [98-107] 98 (09/01 0019) Resp:  [18] 18 (09/01 0019) BP: (130-157)/(87-94) 140/90 (09/01 0019) SpO2:  [95 %-97 %] 95 % (09/01 0019) Weight:  [68.1 kg (150 lb 1.6 oz)] 68.1 kg (150 lb 1.6 oz) (08/31 2147)   Physical Exam  Constitutional: Appears well-developed and well-nourished.  Psych: Affect appropriate to situation Eyes: No scleral injection HENT: No OP obstrucion Head: Normocephalic.  Cardiovascular: Normal rate and regular rhythm.  Respiratory: Effort normal and breath sounds normal to anterior ascultation GI: Soft.  No distension. There is no tenderness.  Skin: WDI  Neuro: Mental Status: Patient is awake, alert, oriented to person, place, month, year, and situation. She has some expressive aphasia with difficulty naming objects. Repetition and comprehension is intact. Was not able to subtract serial sevens from 100. Cranial Nerves: II: Visual Fields are full.  Pupils are equal, round, and reactive to light.   III,IV, VI: EOMI without ptosis or diploplia.  V: Facial sensation is symmetric to temperature VII: Facial movement is symmetric.  VIII: hearing is intact to voice X: Uvula elevates symmetrically XI: Shoulder shrug is symmetric. XII: tongue is midline without atrophy or fasciculations.  Motor: Tone is normal. Bulk is normal. 5/5 strength was present in all four extremities. Sensory: Sensation is symmetric to light touch and temperature in the arms and legs Deep Tendon Reflexes: 2+ and symmetric in the biceps and patellae. Plantars: Toes are downgoing  bilaterally.  Cerebellar: FNF and HKS are intact bilaterally   Assessment and plan  71 year old female with history of prediabetes, hypertension, hyperlipidemia, migraines, vitamin deficiency presents with worsening confusion, word finding difficulty and memory problems upon waking up yesterday, with confusion that has been going on for about 2 weeks according to the family. She also has been having increased headaches since the last 2 weeks. The patient however tells me she isn't complaining more finding difficulty and which has been going on for a year.CT head shows significant white matter changes.   Memory problems Expressive aphasia Headache  Differentials  Possible Left MCA stroke - given her vascular risk factors and presentation of aphasia this is certainly possibility. Degree of aphasia is relatively mild and can be missed on CT scan. However, given subacute to chronic presentation less likely. Recommend obtaining MRI head, however patient is severely claustrophobic and me be potentially challenging.  Possible Medication Induced : word finding difficulty/confusion from Topamax /Benzodiazepines - the patient has been restarted on Topamax over a year ago, and this can be associated with word finding difficulty. The patient did complain to me that her memory and word finding difficulty has been going on for years. Her symptoms could be worse today because of sleep deprivation.She also takes [place] which can cause confusion.   Possible early dementia/cognitive impairment: Constitution memory problems, or finding difficulty or 1 year with worsening symptoms in the setting of sleep deprivation  Plan  MRI head if she can tolerate it after receiving Ativan  If not, may repeat CT head and CTA head and neck. If negative, less likely to be a stroke. Recommend an open MRI head as an outpatient neurology follow-up I agree with obtaining B12, TSH, RPR   Karena Addison Aroor MD [facility] [phone]  If 7pm to 7am, please call on call as listed on AMION.   ###########   ADDENDUM  MRI was completed and reviewed. No acute infarct, however shows chronic microhemorrhages as well 1 macrohemorrhage of 39mm in left temporal lobe.   Cerebral Amyloid Angiopathy  MRI with multiple chronic hemorrhages subcortical in this elderly patient with clinical history memory decline suggests she has  probable CAA based Boston's criteria.   She does not have a stroke. PLEASE DISCONTINUE ASPIRIN.   However still needs aggressive control of hypertension.  Follow up with Behaviorial/Cognitive neurologist  Consider weaning off Klonopin (as outpatient)    Can be discharged after day team can counsel patient regarding diagnosis - high risk of hemorrhage strokes in future, will likely continue to have memory decline and may be increase risk for seizures. If she has episodic spells of confusion, will need an EEG.

## 2016-12-16 NOTE — Progress Notes (Signed)
Pt assessed to be confused this AM. Tele box and IV line pulled out by pt. MD notified. Care handed over to day RN.

## 2016-12-16 NOTE — Evaluation (Signed)
Occupational Therapy Evaluation Patient Details Name: Deborah [name] MRN: [id number] DOB: [birth year] Today's Date: 12/16/2016    History of Present Illness Pt is a 71 y/o female admitted secondary to word finding difficulty and worsening short-term memory. Pt diagnosed with cerebral amyloid angiopathy. PMH including but not limited to HTN, HLD, anxiety and headaches.   Clinical Impression   PTA, pt was independent with all ADL and intermittently used SPC or RW for functional mobility. Pt currently presents with impaired balance, unsteady gait, and cognitive deficits including word finding deficits, slow processing, decreased short-term memory, and decreased awareness. Pt states that she will be returning home at discharge with 24/7 assistance from her husband, but no family in room during OT eval to confirm this. Pt would benefit from 24/7 supervision due to significant balance deficits and cognitive deficits putting her at high fall risk. Pt would benefit from continued acute OT to maximize her independence and safety with ADL and functional mobility prior to d/c home. No DME recommendations at this time. OT will continue to follow acutely.    Follow Up Recommendations  No OT follow up;Supervision/Assistance - 24 hour    Equipment Recommendations  None recommended by OT    Recommendations for Other Services       Precautions / Restrictions Precautions Precautions: Fall Restrictions Weight Bearing Restrictions: No      Mobility Bed Mobility Overal bed mobility: Modified Independent             General bed mobility comments: HOB elevated, increased time  Transfers Overall transfer level: Needs assistance Equipment used: None Transfers: Sit to/from Stand Sit to Stand: Supervision         General transfer comment: Increased time and supervision due to unsteadiness on feet requiring pt to take staggering steps or reach out for UE support to maintain balance    Balance  Overall balance assessment: Needs assistance Sitting-balance support: No upper extremity supported;Feet supported Sitting balance-Leahy Scale: Good     Standing balance support: No upper extremity supported;During functional activity Standing balance-Leahy Scale: Poor Standing balance comment: Leaning to R side due to L knee pain; staggering steps and LOB posteriorly x3                           ADL either performed or assessed with clinical judgement   ADL Overall ADL's : Needs assistance/impaired Eating/Feeding: Independent   Grooming: Wash/dry hands;Wash/dry face;Oral care;Min guard;Standing   Upper Body Bathing: Supervision/ safety;Sitting   Lower Body Bathing: Min guard;Sit to/from stand   Upper Body Dressing : Supervision/safety;Sitting   Lower Body Dressing: Min guard;Sit to/from stand   Toilet Transfer: Min guard;Regular Toilet;Grab bars;Ambulation   Toileting- Clothing Manipulation and Hygiene: Supervision/safety;Sit to/from stand       Functional mobility during ADLs: Min guard General ADL Comments: Pt unsteady on feet with staggering steps taken at times and reaching out for external UE support. No family present for OT eval.     Vision Baseline Vision/History: Wears glasses Wears Glasses: Reading only Patient Visual Report: No change from baseline Vision Assessment?: Yes Eye Alignment: Within Functional Limits Ocular Range of Motion: Within Functional Limits Alignment/Gaze Preference: Within Defined Limits Tracking/Visual Pursuits: Able to track stimulus in all quads without difficulty Saccades: Within functional limits Convergence: Within functional limits Visual Fields: No apparent deficits     Perception Perception Perception Tested?: Yes Perception Deficits: Inattention/neglect;Object recognition;Figure ground;Body part identification Inattention/Neglect: Appears intact Spatial deficits: [facility]  Praxis Praxis Praxis tested?: Within  functional limits    Pertinent Vitals/Pain Pain Assessment: No/denies pain     Hand Dominance Right   Extremity/Trunk Assessment Upper Extremity Assessment Upper Extremity Assessment: Overall WFL for tasks assessed   Lower Extremity Assessment Lower Extremity Assessment: Overall WFL for tasks assessed   Cervical / Trunk Assessment Cervical / Trunk Assessment: Normal   Communication Communication Communication: Expressive difficulties (mild word finding deficits)   Cognition Arousal/Alertness: Awake/alert Behavior During Therapy: WFL for tasks assessed/performed Overall Cognitive Status: Impaired/Different from baseline Area of Impairment: Safety/judgement;Memory                     Memory: Decreased short-term memory   Safety/Judgement: Decreased awareness of safety;Decreased awareness of deficits         General Comments       Exercises     Shoulder Instructions      Home Living Family/patient expects to be discharged to:: Private residence Living Arrangements: Spouse/significant other Available Help at Discharge: Family;Available 24 hours/day Type of Home: House Home Access: Level entry     Home Layout: One level     Bathroom Shower/Tub: Teacher, early years/pre: Standard     Home Equipment: Environmental consultant - 2 wheels;Shower seat;Shower seat - built in;Grab bars - tub/shower;Cane - single point   Additional Comments: Likes to get into tub to take baths      Prior Functioning/Environment Level of Independence: Independent with assistive device(s)        Comments: SPC/RW PRN; used to be a high school Psychologist, prison and probation services        OT Problem List: Decreased strength;Decreased activity tolerance;Impaired balance (sitting and/or standing);Decreased cognition;Decreased safety awareness      OT Treatment/Interventions: Self-care/ADL training;Cognitive remediation/compensation;Patient/family education;Balance training;Therapeutic activities;DME  and/or AE instruction;Therapeutic exercise    OT Goals(Current goals can be found in the care plan section) Acute Rehab OT Goals Patient Stated Goal: return home today OT Goal Formulation: With patient Time For Goal Achievement: 12/30/16 Potential to Achieve Goals: Good ADL Goals Pt Will Perform Lower Body Dressing: (P) with modified independence;sit to/from stand Pt Will Transfer to Toilet: (P) with modified independence;ambulating;regular height toilet Pt Will Perform Toileting - Clothing Manipulation and hygiene: (P) with modified independence;sitting/lateral leans;sit to/from stand  OT Frequency: Min 2X/week   Barriers to D/C:            Co-evaluation              AM-PAC PT "6 Clicks" Daily Activity     Outcome Measure Help from another person eating meals?: None Help from another person taking care of personal grooming?: None Help from another person toileting, which includes using toliet, bedpan, or urinal?: None Help from another person bathing (including washing, rinsing, drying)?: A Little Help from another person to put on and taking off regular upper body clothing?: None Help from another person to put on and taking off regular lower body clothing?: A Little 6 Click Score: 22   End of Session Equipment Utilized During Treatment: Gait belt Nurse Communication: Mobility status  Activity Tolerance: Patient tolerated treatment well Patient left: in bed;with call bell/phone within reach;with bed alarm set  OT Visit Diagnosis: Unsteadiness on feet (R26.81)                Time: 1003-1030 OT Time Calculation (min): 27 min Charges:  OT General Charges $OT Visit: 1 Visit OT Evaluation $OT Eval Moderate Complexity: 1 Mod OT Treatments $Self Care/Home  Management : 8-22 mins G-Codes: OT G-codes **NOT FOR INPATIENT CLASS** Functional Assessment Tool Used: AM-PAC 6 Clicks Daily Activity;Clinical judgement Functional Limitation: Self care Self Care Current Status  (G5364): At least 20 percent but less than 40 percent impaired, limited or restricted Self Care Goal Status (W8032): At least 1 percent but less than 20 percent impaired, limited or restricted   Redmond Baseman, MS, OTR/L 12/16/2016, 11:08 AM

## 2016-12-16 NOTE — Progress Notes (Signed)
Deborah [name] to be D/C'd Home per MD order.  Discussed with the patient and all questions fully answered.  VSS, Skin clean, dry and intact without evidence of skin break down, no evidence of skin tears noted. IV catheter discontinued intact. Site without signs and symptoms of complications. Dressing and pressure applied.  An After Visit Summary was printed and given to the patient. Patient received prescription.  D/c education completed with patient/family including follow up instructions, medication list, d/c activities limitations if indicated, with other d/c instructions as indicated by MD - patient able to verbalize understanding, all questions fully answered.   Patient instructed to return to ED, call 911, or call MD for any changes in condition.   Patient escorted via [place], and D/C home via private auto.  Deborah [name] 12/16/2016 12:46 PM

## 2016-12-16 NOTE — Progress Notes (Signed)
Pt questions reason for PIV placement. States " I thought I was done with the IV stuff". No IV meds noted.  RN notified.  RN will place consult if PIV needed.

## 2016-12-19 LAB — FOLATE RBC
FOLATE, HEMOLYSATE: 393.9 ng/mL
Folate, RBC: 965 ng/mL (ref >498)
Hematocrit: 40.8 % (ref 34.0–46.6)

## 2016-12-19 LAB — ANA W/REFLEX IF POSITIVE: Anti Nuclear Antibody(ANA): NEGATIVE

## 2016-12-19 LAB — VITAMIN B1: VITAMIN B1 (THIAMINE): 88.3 nmol/L (ref 66.5–200.0)

## 2016-12-20 ENCOUNTER — Ambulatory Visit (INDEPENDENT_AMBULATORY_CARE_PROVIDER_SITE_OTHER): Payer: Medicare Other | Admitting: Internal Medicine

## 2016-12-20 ENCOUNTER — Encounter: Payer: Self-pay | Admitting: Internal Medicine

## 2016-12-20 VITALS — BP 124/78 | HR 80 | Temp 97.5°F | Resp 16 | Ht 66.0 in | Wt 151.2 lb

## 2016-12-20 DIAGNOSIS — I680 Cerebral amyloid angiopathy: Secondary | ICD-10-CM | POA: Diagnosis not present

## 2016-12-20 DIAGNOSIS — I10 Essential (primary) hypertension: Secondary | ICD-10-CM

## 2016-12-20 DIAGNOSIS — G43909 Migraine, unspecified, not intractable, without status migrainosus: Secondary | ICD-10-CM | POA: Diagnosis not present

## 2016-12-20 DIAGNOSIS — R4701 Aphasia: Secondary | ICD-10-CM

## 2016-12-20 NOTE — Patient Instructions (Signed)
Migraine Headache A migraine headache is an intense, throbbing pain on one side or both sides of the head. Migraines may also cause other symptoms, such as nausea, vomiting, and sensitivity to light and noise. What are the causes? Doing or taking certain things may also trigger migraines, such as:  Alcohol.  Smoking.  Medicines, such as: ? Medicine used to treat chest pain (nitroglycerine). ? Birth control pills. ? Estrogen pills. ? Certain blood pressure medicines.  Aged cheeses, chocolate, or caffeine.  Foods or drinks that contain nitrates, glutamate, aspartame, or tyramine.  Physical activity.  Other things that may trigger a migraine include:  Menstruation.  Pregnancy.  Hunger.  Stress, lack of sleep, too much sleep, or fatigue.  Weather changes.  What increases the risk? The following factors may make you more likely to experience migraine headaches:  Age. Risk increases with age.  Family history of migraine headaches.  Being Caucasian.  Depression and anxiety.  Obesity.  Being a woman.  Having a hole in the heart (patent foramen ovale) or other heart problems.  What are the signs or symptoms? The main symptom of this condition is pulsating or throbbing pain. Pain may:  Happen in any area of the head, such as on one side or both sides.  Interfere with daily activities.  Get worse with physical activity.  Get worse with exposure to bright lights or loud noises.  Other symptoms may include:  Nausea.  Vomiting.  Dizziness.  General sensitivity to bright lights, loud noises, or smells.  Before you get a migraine, you may get warning signs that a migraine is developing (aura). An aura may include:  Seeing flashing lights or having blind spots.  Seeing bright spots, halos, or zigzag lines.  Having tunnel vision or blurred vision.  Having numbness or a tingling feeling.  Having trouble talking.  Having muscle weakness.  How is this  diagnosed? A migraine headache can be diagnosed based on:  Your symptoms.  A physical exam.  Tests, such as CT scan or MRI of the head. These imaging tests can help rule out other causes of headaches.  Taking fluid from the spine (lumbar puncture) and analyzing it (cerebrospinal fluid analysis, or CSF analysis).  How is this treated? A migraine headache is usually treated with medicines that:  Relieve pain.  Relieve nausea.  Prevent migraines from coming back.  Treatment may also include:  Acupuncture.  Lifestyle changes like avoiding foods that trigger migraines.  Follow these instructions at home: Medicines  Take over-the-counter and prescription medicines only as told by your health care provider.  Do not drive or use heavy machinery while taking prescription pain medicine.  To prevent or treat constipation while you are taking prescription pain medicine, your health care provider may recommend that you: ? Drink enough fluid to keep your urine clear or pale yellow. ? Take over-the-counter or prescription medicines. ? Eat foods that are high in fiber, such as fresh fruits and vegetables, whole grains, and beans. ? Limit foods that are high in fat and processed sugars, such as fried and sweet foods. Lifestyle  Avoid alcohol use.  Do not use any products that contain nicotine or tobacco, such as cigarettes and e-cigarettes. If you need help quitting, ask your health care provider.  Get at least 8 hours of sleep every night.  Limit your stress. General instructions   Keep a journal to find out what may trigger your migraine headaches. For example, write down: ? What you eat and   drink. ? How much sleep you get. ? Any change to your diet or medicines.  If you have a migraine: ? Avoid things that make your symptoms worse, such as bright lights. ? It may help to lie down in a dark, quiet room. ? Do not drive or use heavy machinery. ? Ask your health care provider  what activities are safe for you while you are experiencing symptoms.  Keep all follow-up visits as told by your health care provider. This is important. Contact a health care provider if:  You develop symptoms that are different or more severe than your usual migraine symptoms. Get help right away if:  Your migraine becomes severe.  You have a fever.  You have a stiff neck.  You have vision loss.  Your muscles feel weak or like you cannot control them.  You start to lose your balance often.  You develop trouble walking.  You faint. This information is not intended to replace advice given to you by your health care provider. Make sure you discuss any questions you have with your health care provider. Document Released: 04/03/2005 Document Revised: 10/22/2015 Document Reviewed: 09/20/2015 Elsevier Interactive Patient Education  2017 [employer]   

## 2016-12-20 NOTE — Progress Notes (Signed)
[place]     This very nice 71 y.o. MWF was admitted 8/31-12/16/2016 for TIA, Cerebral Amyloid Angiopathy, confusion, aphasia, HTN, Migraine and presents now  for post hospital follow up . Patient was seen in neuro consultation recommending switching her Topamax to Depakote (which she hasn't started yet). Patient is scheduling for Neurology f/u.  Patient was contacted post discharge by office staff to assure stability and schedule f/u.      Hospitalization discharge instructions and medications are reconciled with the patient.      Patient is also followed with Labile Hypertension (2010), Hyperlipidemia, Pre-Diabetes and Vitamin D Deficiency. She has had long term ongoing difficulties with nausea and in July was treated w/Pylera by Dr Earlean Shawl  for Bx(+) H.Pylori and has has moderate improvement in her nausea.      Patient has been followed expectantly for labile  HTN & BP has been controlled at home. Today's BP is at goal - 124/78. Patient has had no complaints of any cardiac type chest pain, palpitations, dyspnea/orthopnea/PND, dizziness, claudication, or dependent edema.     Hyperlipidemia is controlled with diet & her back on her Crestor. Patient denies myalgias or other med SE's. Last Lipids were at goal: Lab Results  Component Value Date   CHOL 178 12/16/2016   HDL 61 12/16/2016   LDLCALC 102 (H) 12/16/2016   TRIG 74 12/16/2016   CHOLHDL 2.9 12/16/2016      Also, the patient has history of  PreDiabetes since 2011 with A1c 6.1%  and has had no symptoms of reactive hypoglycemia, diabetic polys, paresthesias or visual blurring.  Since 2011, she has lost ~ 30 # down from a high of 180# to her current 151 # and her last A1c was back to normal:  Lab Results  Component Value Date   HGBA1C 5.6 12/16/2016      Further, the patient also has history of Vitamin D Deficiency  ("18" / 2008) and supplements vitamin D without any suspected side-effects. Last vitamin D was sl elevated  and dose was tapered:   Lab Results  Component Value Date   VD25OH 117 (H) 10/26/2016   Current Outpatient Prescriptions on File Prior to Visit  Medication Sig  . ALPRAZolam-XR 2 MG Take 1 tablet every morning for anxiety attacks  . VITAMIN-B COMPLEX Take 1 capsule by mouth daily.  Marland Kitchen BIOTIN Takes 10000 mcg plus Keratin 1 daily  . VITAMIN D  Take 5,000 Units by mouth daily.  Marland Kitchen DEPAKOTE 500 MG DR  Take 1 tablet (500 mg total) by mouth every 12 (twelve) hours.  . famotidine  40 MG tablet Take 40 mg by mouth daily.   Marland Kitchen FLONASE nasal spray Place 2 sprays into both nostrils at bedtime.  . prochlorperazine  10 MG tab Take every 6 hrs as needed for nausea or vomiting.  . rosuvastatin  20 MG tablet Take 1 tablet (20 mg total) by mouth daily.  . SUMAtriptan (IMITREX) 20 MG nasal spray USE 1 DOSE  NOW, MAY REPEAT IN 2 HRS, MAX 2 DOSEs / 24 HRS  . ezetimibe  10 MG tablet  Take 1 tablet daily for Cholesterol   Allergies  Allergen Reactions  . Banana Nausea And Vomiting  . Effexor [Venlafaxine] Other (See Comments)    insomnia  . Maxalt [Rizatriptan Benzoate] Nausea Only and Other (See Comments)    Nausea and excessive salivation  . Tussin [Guaifenesin]     rash  . Zostavax [Zoster Vaccine Live] Itching and  Rash   PMHx:   Past Medical History:  Diagnosis Date  . Allergy   . Anemia   . Anxiety   . Arthritis   . Depression   . Hyperlipidemia   . Hypertension   . Migraines   . Prediabetes   . RLS (restless legs syndrome)   . Vitamin D deficiency    Immunization History  Administered Date(s) Administered  . Influenza, High Dose Seasonal PF 01/01/2014, 03/15/2015, 12/13/2015  . Influenza-Unspecified 12/26/2012  . Pneumococcal Conjugate-13 08/06/2014  . Pneumococcal-Unspecified 12/26/2012  . Td 12/26/2012  . Zoster 12/08/2010   Past Surgical History:  Procedure Laterality Date  . KNEE ARTHROSCOPY Bilateral 04/2014   [facility]  . 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 SPINE SURGERY  2010   C6-C7 fusion   FHx:    Reviewed / unchanged  SHx:    Reviewed / unchanged  Systems Review:  Constitutional: Denies fever, chills, wt changes, headaches, insomnia, fatigue, night sweats, change in appetite. Eyes: Denies redness, blurred vision, diplopia, discharge, itchy, watery eyes.  ENT: Denies discharge, congestion, post nasal drip, epistaxis, sore throat, earache, hearing loss, dental pain, tinnitus, vertigo, sinus pain, snoring.  CV: Denies chest pain, palpitations, irregular heartbeat, syncope, dyspnea, diaphoresis, orthopnea, PND, claudication or edema. Respiratory: denies cough, dyspnea, DOE, pleurisy, hoarseness, laryngitis, wheezing.  Gastrointestinal: Denies dysphagia, odynophagia, heartburn, reflux, water brash, abdominal pain or cramps, nausea, vomiting, bloating, diarrhea, constipation, hematemesis, melena, hematochezia  or hemorrhoids. Genitourinary: Denies dysuria, frequency, urgency, nocturia, hesitancy, discharge, hematuria or flank pain. Musculoskeletal: Denies arthralgias, myalgias, stiffness, jt. swelling, pain, limping or strain/sprain.  Skin: Denies pruritus, rash, hives, warts, acne, eczema or change in skin lesion(s). Neuro: No weakness, tremor, incoordination, spasms, paresthesia or pain. Psychiatric: Denies confusion, memory loss or sensory loss. Endo: Denies change in weight, skin or hair change.  Heme/Lymph: No excessive bleeding, bruising or enlarged lymph nodes.  Physical Exam  BP 124/78   Pulse 80   Temp (!) 97.5 F (36.4 C)   Resp 16   Ht 5\' 6"  (1.676 m)   Wt 151 lb 3.2 oz (68.6 kg)   BMI 24.40 kg/m   Appears well nourished, well groomed  and in no distress.  Eyes: PERRLA, EOMs, conjunctiva no swelling or erythema. Sinuses: No frontal/maxillary tenderness ENT/Mouth: EAC's clear, TM's nl w/o erythema, bulging. Nares clear w/o erythema, swelling, exudates. Oropharynx clear without erythema or exudates. Oral  hygiene is good. Tongue normal, non obstructing. Hearing intact.  Neck: Supple. Thyroid nl. Car 2+/2+ without bruits, nodes or JVD. Chest: Respirations nl with BS clear & equal w/o rales, rhonchi, wheezing or stridor.  Cor: Heart sounds normal w/ regular rate and rhythm without sig. murmurs, gallops, clicks or rubs. Peripheral pulses normal and equal  without edema.  Lymphatics: Unremarkable.  Musculoskeletal: Full ROM all peripheral extremities, joint stability, 5/5 strength and normal gait.  Skin: Warm, dry without exposed rashes, lesions or ecchymosis apparent.  Neuro: Cranial nerves intact, reflexes equal bilaterally. Sensory-motor testing grossly intact. Tendon reflexes grossly intact.  Pysch: Alert & oriented x 3.  Insight and judgement nl & appropriate. No ideations.  Assessment and Plan:  1. Acute confusional migraine  - Encouraged f/u with Neurology   2. Cerebral amyloid angiopathy (CODE)  3. Aphasia  4. Essential hypertension - Continue medication, monitor blood pressure at home.  - Continue DASH diet. Reminder to go to the ER if any CP,  SOB, nausea, dizziness, severe HA, changes vision/speech. - Continue  exercise, lifestyle modifications.  - Monitor appropriate labs.       Discussed  regular exercise, BP monitoring, weight control to achieve/maintain BMI less than 25 and discussed med and SE's. Recommended labs to assess and monitor clinical status with further disposition pending results of labs. Over 30 minutes of exam, counseling, chart review was performed.

## 2016-12-25 ENCOUNTER — Encounter: Payer: Self-pay | Admitting: Diagnostic Neuroimaging

## 2016-12-25 ENCOUNTER — Ambulatory Visit (INDEPENDENT_AMBULATORY_CARE_PROVIDER_SITE_OTHER): Payer: Medicare Other | Admitting: Diagnostic Neuroimaging

## 2016-12-25 VITALS — BP 121/88 | HR 85 | Ht 66.0 in | Wt 154.6 lb

## 2016-12-25 DIAGNOSIS — R410 Disorientation, unspecified: Secondary | ICD-10-CM

## 2016-12-25 DIAGNOSIS — R413 Other amnesia: Secondary | ICD-10-CM | POA: Diagnosis not present

## 2016-12-25 DIAGNOSIS — G43009 Migraine without aura, not intractable, without status migrainosus: Secondary | ICD-10-CM

## 2016-12-25 DIAGNOSIS — F809 Developmental disorder of speech and language, unspecified: Secondary | ICD-10-CM | POA: Diagnosis not present

## 2016-12-25 MED ORDER — DIVALPROEX SODIUM 500 MG PO DR TAB: 500.0000 mg | DELAYED_RELEASE_TABLET | Freq: Every day | ORAL | 4 refills | Status: DC

## 2016-12-25 NOTE — Progress Notes (Signed)
[facility]  PATIENT: Deborah [name] DOB: 1946-02-03  REFERRING CLINICIAN: Mike Craze HISTORY FROM: patient and chart review REASON FOR VISIT: new consult   HISTORICAL  CHIEF COMPLAINT:  Chief Complaint  Patient presents with  . NP Internal referral [facility]  . Transient alteration of awareness    Episode of not acting normal (goofy).     HISTORY OF PRESENT ILLNESS:   71 year old female here for evaluation of confusion and word finding difficulty. Patient was admitted to the hospital on 12/15/16 for headache, confusion, word finding difficulty. Apparently she woke up, went to the grocery*, and when she returned home felt confused. She apparently talked to her son over the phone who could not understand what she was saying. Patient went to the neighbor's house and they were able to call for help. MRI of the brain showed evidence of possible cerebral amyloid angiopathy. Patient was diagnosed with possible underlying neurodegenerative process with superimposed confusion related to topiramate. Complex partial seizure was also raised as a possibility. She was discharged on Depakote instead of Topamax.  Patient also reports history of headaches and migraines at least once per month.   REVIEW OF SYSTEMS: Full 14 system review of systems performed and negative with exception of: Headache runny nose.  ALLERGIES: Allergies  Allergen Reactions  . Banana Nausea And Vomiting  . Effexor [Venlafaxine] Other (See Comments)    insomnia  . Maxalt [Rizatriptan Benzoate] Nausea Only and Other (See Comments)    Nausea and excessive salivation  . Tussin [Guaifenesin]     rash  . Zostavax [Zoster Vaccine Live] Itching and Rash    HOME MEDICATIONS: Outpatient Medications Prior to Visit  Medication Sig Dispense Refill  . ALPRAZolam (XANAX XR) 2 MG 24 hr tablet Take 1 tablet every morning for anxiety attacks (Patient taking differently: Take 1 tablet every morning for  anxiety attacks.  Taking 0.5mg  prn for anxiety) 30 tablet 0  . B Complex Vitamins (VITAMIN-B COMPLEX PO) Take 1 capsule by mouth daily.    Marland Kitchen BIOTIN PO Take by mouth. Patient takes 10000 mcg plus Keratin 1 daily    . Cholecalciferol (VITAMIN D PO) Take 5,000 Units by mouth daily.    . divalproex (DEPAKOTE) 500 MG DR tablet Take 1 tablet (500 mg total) by mouth every 12 (twelve) hours. (Patient taking differently: Take 500 mg by mouth at bedtime. ) 60 tablet 3  . famotidine (PEPCID) 40 MG tablet Take 40 mg by mouth.    . fluticasone (FLONASE) 50 MCG/ACT nasal spray Place 2 sprays into both nostrils at bedtime. 16 g 1  . prochlorperazine (COMPAZINE) 10 MG tablet Take 10 mg by mouth every 6 (six) hours as needed for nausea or vomiting.    . rosuvastatin (CRESTOR) 20 MG tablet Take 1 tablet (20 mg total) by mouth daily. 90 tablet 1  . SUMAtriptan (IMITREX) 20 MG/ACT nasal spray USE ONE DOSE IN THE NOSE NOW MAY REPEAT IN 2 HOURS, MAX 2 DOSES IN 24 HOURS (Patient taking differently: Place 20 mg into the nose every 2 (two) hours as needed for migraine. USE ONE DOSE IN THE NOSE NOW MAY REPEAT IN 2 HOURS, MAX 2 DOSES IN 24 HOURS) 12 Inhaler 0  . UNABLE TO FIND Outpatient physical therapy  Diagnosis: cerebral amyloid angiopathy.gait instability 1 Mutually Defined 0  . ezetimibe (ZETIA) 10 MG tablet Take 1 tablet daily for Cholesterol (Patient taking differently: Take 10 mg by mouth daily. Take 1 tablet daily for Cholesterol) 90 tablet  1   No facility-administered medications prior to visit.     PAST MEDICAL HISTORY: Past Medical History:  Diagnosis Date  . Allergy   . Anemia   . Anxiety   . Arthritis   . Depression   . Hyperlipidemia   . Hypertension   . Migraines   . Prediabetes   . RLS (restless legs syndrome)   . Vitamin D deficiency     PAST SURGICAL HISTORY: Past Surgical History:  Procedure Laterality Date  . KNEE ARTHROSCOPY Bilateral 04/2014   [facility]  . MOHS SURGERY Right  04/2013   Squamous cell cacinoma  . SPINE SURGERY  2010   C6-C7 fusion    FAMILY HISTORY: Family History  Problem Relation Age of Onset  . Diabetes Mother   . Heart disease Mother   . Heart disease Father   . Stroke Father   . Hypertension Father   . Hyperlipidemia Sister   . Hypertension Brother     SOCIAL HISTORY:  Social History   Social History  . Marital status: Married    Spouse name: N/A  . Number of children: N/A  . Years of education: N/A   Occupational History  . Not on file.   Social History Main Topics  . Smoking status: Former Smoker    Packs/day: 1.00    Years: 4.00    Types: Cigarettes    Quit date: 03/25/1986  . Smokeless tobacco: Never Used  . Alcohol use Yes     Comment: occasional  . Drug use: No  . Sexual activity: Not on file   Other Topics Concern  . Not on file   Social History Narrative  . No narrative on file     PHYSICAL EXAM  GENERAL EXAM/CONSTITUTIONAL: Vitals:  Vitals:   12/25/16 1008  BP: 121/88  Pulse: 85  Weight: 154 lb 9.6 oz (70.1 kg)  Height: 5\' 6"  (1.676 m)     Body mass index is 24.95 kg/m.  No exam data present  Patient is in no distress; well developed, nourished and groomed; neck is supple  CARDIOVASCULAR:  Examination of carotid arteries is normal; no carotid bruits  Regular rate and rhythm, no murmurs  Examination of peripheral vascular system by observation and palpation is normal  EYES:  Ophthalmoscopic exam of optic discs and posterior segments is normal; no papilledema or hemorrhages  MUSCULOSKELETAL:  Gait, strength, tone, movements noted in Neurologic exam below  NEUROLOGIC: MENTAL STATUS:  MMSE - Mini Mental State Exam 12/25/2016  Orientation to time 4  Orientation to Place 5  Registration 3  Attention/ Calculation 1  Recall 1  Language- name 2 objects 2  Language- repeat 1  Language- follow 3 step command 3  Language- read & follow direction 1  Write a sentence 1  Copy  design 0  Total score 22    awake, alert, oriented to person, place and time  DECR RECALL MEMORY  DECR attention and concentration  language fluent, comprehension intact, naming intact   fund of knowledge appropriate  CRANIAL NERVE:   2nd - no papilledema on fundoscopic exam  2nd, 3rd, 4th, 6th - pupils equal and reactive to light, visual fields full to confrontation, extraocular muscles intact, no nystagmus  5th - facial sensation symmetric  7th - facial strength symmetric  8th - hearing intact  9th - palate elevates symmetrically, uvula midline  11th - shoulder shrug symmetric  12th - tongue protrusion midline  MOTOR:   normal bulk and tone,  full strength in the BUE, BLE  SENSORY:   normal and symmetric to light touch, temperature, vibration    COORDINATION:   finger-nose-finger, fine finger movements normal  REFLEXES:   deep tendon reflexes present and symmetric  GAIT/STATION:   narrow based gait; romberg is negative    DIAGNOSTIC DATA (LABS, IMAGING, TESTING) - I reviewed patient records, labs, notes, testing and imaging myself where available.  Lab Results  Component Value Date   WBC 6.2 12/15/2016   HGB 13.5 12/15/2016   HCT 40.8 12/15/2016   MCV 88.9 12/15/2016   PLT 262 12/15/2016      Component Value Date/Time   NA 140 12/16/2016 0613   K 3.3 (L) 12/16/2016 0613   CL 112 (H) 12/16/2016 0613   CO2 20 (L) 12/16/2016 0613   GLUCOSE 102 (H) 12/16/2016 0613   BUN 13 12/16/2016 0613   CREATININE 0.84 12/16/2016 0613   CREATININE 0.96 (H) 10/26/2016 1049   CALCIUM 8.9 12/16/2016 0613   PROT 7.3 12/15/2016 1710   ALBUMIN 4.1 12/15/2016 1710   AST 25 12/15/2016 1710   ALT 14 12/15/2016 1710   ALKPHOS 63 12/15/2016 1710   BILITOT 0.6 12/15/2016 1710   GFRNONAA >60 12/16/2016 0613   GFRNONAA 60 10/26/2016 1049   GFRAA >60 12/16/2016 0613   GFRAA 69 10/26/2016 1049   Lab Results  Component Value Date   CHOL 178 12/16/2016   HDL  61 12/16/2016   LDLCALC 102 (H) 12/16/2016   TRIG 74 12/16/2016   CHOLHDL 2.9 12/16/2016   Lab Results  Component Value Date   HGBA1C 5.6 12/16/2016   Lab Results  Component Value Date   ZOXWRUEA54 098 12/16/2016   Lab Results  Component Value Date   TSH 2.248 12/15/2016    12/16/16 MRI brain [I reviewed images myself and agree with interpretation. -VRP]  1. Diffuse peripheral supratentorial distribution of chronic microhemorrhage. The pattern is consistent with cerebral amyloid angiopathy and is atypical in distribution for chronic hypertensive encephalopathy. There is a single larger 5 mm focus of chronic hemorrhage within left lateral temporal lobe. 2. Moderate chronic microvascular ischemic changes and moderate parenchymal volume loss of the brain. 3. No acute intracranial abnormality identified.     ASSESSMENT AND PLAN  71 y.o. year old female here with New onset confusion, word finding difficulty, memory loss, on 12/15/16. This could have been related to seizure. Also with subacute to chronic confusion and short-term memory loss for at least 1-2 years, likely related to underlying neurodegenerative process. MMSE 22 out of 30.     Dx:  1. Confusion   2. Memory loss   3. Language difficulty   4. Migraine without aura and without status migrainosus, not intractable      PLAN: - check EEG - continue divalproex 500mg  at bedtime (could not tolerate twice a day due to nausea) - will request family to be present for next visit - brain healthy activities reviewed  Orders Placed This Encounter  Procedures  . EEG adult   Meds ordered this encounter  Medications  . divalproex (DEPAKOTE) 500 MG DR tablet    Sig: Take 1 tablet (500 mg total) by mouth at bedtime.    Dispense:  90 tablet    Refill:  4   Return in about 2 months (around 02/24/2017).  I reviewed images, labs, notes, records myself. I summarized findings and reviewed with patient, for this high risk  condition (seizure vs neurodegenerative dz) requiring high complexity decision making.  Penni Bombard, MD 8/86/7737, 36:68 AM Certified in Neurology, Neurophysiology and Neuroimaging  [facility] [address] [place], [place] [address] [phone]

## 2016-12-25 NOTE — Patient Instructions (Signed)
Thank you for coming to see [place] at [facility]. I hope we have been able to provide you high quality care today.  You may receive a patient satisfaction survey over the next few weeks. We would appreciate your feedback and comments so that we may continue to improve ourselves and the health of our patients.  - I will check EEG  - continue divalproex 560m at bedtime    ~~~~~~~~~~~~~~~~~~~~~~~~~~~~~~~~~~~~~~~~~~~~~~~~~~~~~~~~~~~~~~~~~  DR. PENUMALLI'S GUIDE TO HAPPY AND HEALTHY LIVING These are some of my general health and wellness recommendations. Some of them may apply to you better than others. Please use common sense as you try these suggestions and feel free to ask me any questions.   ACTIVITY/FITNESS Mental, social, emotional and physical stimulation are very important for brain and body health. Try learning a new activity (arts, music, language, sports, games).  Keep moving your body to the best of your abilities. You can do this at home, inside or outside, the park, community center, gym or anywhere you like. Consider a physical therapist or personal trainer to get started. Consider the app Sworkit. Fitness trackers such as smart-watches, smart-phones or Fitbits can help as well.   NUTRITION Eat more plants: colorful vegetables, nuts, seeds and berries.  Eat less sugar, salt, preservatives and processed foods.  Avoid toxins such as cigarettes and alcohol.  Drink water when you are thirsty. Warm water with a slice of lemon is an excellent morning drink to start the day.  Consider these websites for more information The Nutrition Source (hhttps://www.henry-hernandez.biz/ [employer] (wWindowBlog.ch   RELAXATION Consider practicing mindfulness meditation or other relaxation techniques such as deep breathing, prayer, yoga, tai chi, massage. See website mindful.org or the apps Headspace or Calm to help get  started.   SLEEP Try to get at least 7-8+ hours sleep per day. Regular exercise and reduced caffeine will help you sleep better. Practice good sleep hygeine techniques. See website sleep.org for more information.   PLANNING Prepare estate planning, living will, healthcare POA documents. Sometimes this is best planned with the help of an attorney. Theconversationproject.org and agingwithdignity.org are excellent resources.

## 2016-12-27 ENCOUNTER — Ambulatory Visit (INDEPENDENT_AMBULATORY_CARE_PROVIDER_SITE_OTHER): Payer: Medicare Other | Admitting: Internal Medicine

## 2016-12-27 VITALS — BP 142/92 | HR 88 | Temp 97.6°F | Resp 18 | Ht 66.0 in | Wt 151.0 lb

## 2016-12-27 DIAGNOSIS — R112 Nausea with vomiting, unspecified: Secondary | ICD-10-CM | POA: Diagnosis not present

## 2016-12-27 DIAGNOSIS — R12 Heartburn: Secondary | ICD-10-CM

## 2016-12-27 DIAGNOSIS — R51 Headache: Secondary | ICD-10-CM

## 2016-12-27 DIAGNOSIS — R519 Headache, unspecified: Secondary | ICD-10-CM

## 2016-12-27 MED ORDER — PROCHLORPERAZINE MALEATE 10 MG PO TABS: ORAL_TABLET | ORAL | 1 refills | Status: DC

## 2016-12-27 MED ORDER — PANTOPRAZOLE SODIUM 40 MG PO TBEC: DELAYED_RELEASE_TABLET | ORAL | 1 refills | Status: DC

## 2016-12-28 ENCOUNTER — Other Ambulatory Visit: Payer: Self-pay | Admitting: Internal Medicine

## 2016-12-30 ENCOUNTER — Encounter: Payer: Self-pay | Admitting: Internal Medicine

## 2016-12-30 NOTE — Progress Notes (Signed)
[facility]  Unk Pinto, M.D.      [name]. [name], P.A.-C [facility] [address] [place] [address] Telephone [phone] Telefax [phone]  Subjective:    Patient ID: Deborah [name], female    DOB: [birth year], 71 y.o.   MRN: [id number]  HPI  This nice 71 yo MWF recently hospitalized with acute confusion DDx atypical partial complex seizure vs acute confusional migraine who was also found to have Cerebral Amyloid Angiopathy has been having ongoing GI problems with persistent N/V had recent treatment by Dr Earlean Shawl in July for H.Pylori with initial improvement re-presents now having been off of PPI,  now again with HA's, N/V. Denies HB/reflux/waterbrash. HA's are frontal and non-focal and w/o aura. Patient is now seeing Dr Leta Baptist for ongoing neuro evaluation of HA's and cognitive dysfunction.  Medication Sig  . ALPRAZolam-XR 2 MG 24 hr   Taking 0.5mg  prn for anxiety)  . B Complex Vitamins  Take 1 capsule by mouth daily.  Marland Kitchen BIOTIN  Take by mouth. Patient takes 10000 mcg plus Keratin 1 daily  . VITAMIN D Take 5,000 Units by mouth daily.  . divalproex (DEPAKOTE) 500 MG DR Take 1 tablet (500 mg total) by mouth at bedtime.  . famotidine40 MG tablet Take 40 mg by mouth.  Asencion Islam nasal spray  Place 2 sprays into both nostrils daily   . Rosuvastatin 20 MG tablet Take 1 tablet (20 mg total) by mouth daily.  . prochlorperazine  10 MG tab Take every 6  hrs as needed for nausea or vomiting.  . SUMAtriptan 20 MG nasal  USE ONE DOSE & MAY REPEAT IN 2 HRS  . ezetimibe 10 MG tablet Take 1 tablet daily for Cholesterol   Allergies  Allergen Reactions  . Banana Nausea And Vomiting  . Effexor [Venlafaxine] Other (See Comments)    insomnia  . Maxalt [Rizatriptan Benzoate] Nausea Only and Other (See Comments)    Nausea and excessive salivation  . Tussin [Guaifenesin]     rash  . Zostavax [Zoster Vaccine Live]  Itching and Rash   Review of Systems  10 point systems review negative except as above.    Objective:   Physical Exam  BP (!) 142/92   Pulse 88   Temp 97.6 F (36.4 C)   Resp 18   Ht 5\' 6"  (1.676 m)   Wt 151 lb (68.5 kg)   BMI 24.37 kg/m   HEENT - WNL. Neck - supple.  Chest - Clear equal BS. Cor - Nl HS. RRR w/o sig MGR. PP 1(+). No edema. MS- FROM w/o deformities.  Gait Nl. Neuro -  Nl w/o focal abnormalities.    Assessment & Plan:   1. Pyrosis  - pantoprazole (PROTONIX) 40 MG tablet; Take 1 tablet daily for heart burn, Indigestion and Nausea  Dispense: 90 tablet; Refill: 1  2. Intractable vomiting with nausea, vomiting  - prochlorperazine (COMPAZINE) 10 MG tablet; Take 1 tablet 3 to 4 x / day for severe Nausea  Dispense: 90 tablet; Refill: 1  3. Intractable episodic headache  - discussed meds/SE's & ROV 1 week

## 2017-01-02 ENCOUNTER — Ambulatory Visit (INDEPENDENT_AMBULATORY_CARE_PROVIDER_SITE_OTHER): Payer: Medicare Other | Admitting: Diagnostic Neuroimaging

## 2017-01-02 DIAGNOSIS — R410 Disorientation, unspecified: Secondary | ICD-10-CM | POA: Diagnosis not present

## 2017-01-02 DIAGNOSIS — G9340 Encephalopathy, unspecified: Secondary | ICD-10-CM

## 2017-01-02 HISTORY — DX: Encephalopathy, unspecified: G93.40

## 2017-01-03 ENCOUNTER — Encounter: Payer: Self-pay | Admitting: Internal Medicine

## 2017-01-03 ENCOUNTER — Ambulatory Visit (INDEPENDENT_AMBULATORY_CARE_PROVIDER_SITE_OTHER): Payer: Medicare Other | Admitting: Internal Medicine

## 2017-01-03 VITALS — BP 124/72 | HR 72 | Temp 97.5°F | Resp 18 | Ht 66.0 in | Wt 150.6 lb

## 2017-01-03 DIAGNOSIS — Z79899 Other long term (current) drug therapy: Secondary | ICD-10-CM

## 2017-01-03 DIAGNOSIS — R12 Heartburn: Secondary | ICD-10-CM | POA: Diagnosis not present

## 2017-01-03 DIAGNOSIS — Z23 Encounter for immunization: Secondary | ICD-10-CM | POA: Diagnosis not present

## 2017-01-03 DIAGNOSIS — G43009 Migraine without aura, not intractable, without status migrainosus: Secondary | ICD-10-CM

## 2017-01-03 NOTE — Progress Notes (Signed)
  Subjective:    Patient ID: Deborah [name], female    DOB: 02/19/1946, 71 y.o.   MRN: [id number]  HPI  Patient returns for 1 week f/u after restarting Protonix for refractory N/V and she reports HA and was given IVF and Head CT scan was negative. Since then she's done well with no more HA's or nausea. Likewise, she denies any HB/reflux or water brash sx's since back on Protonix. She had EEG yesterday for Dr Leta Baptist. ( husband thinks he may have been giving her Depakote 2 x/day)   Medication Sig  . ALPRAZolam-XR 2 MG  Says she's stopped  . B Complex Vitamins  Take 1 capsule by mouth daily.  Marland Kitchen BIOTIN  Take by mouth. Patient takes 10000 mcg plus Keratin 1 daily  . VITAMIN D Take 5,000 Units by mouth daily.  . divalproex (DEPAKOTE) 500 MG DR Take 1 tablet (500 mg total) by mouth at bedtime.  . Famotidine 40 MG tablet Take 40 mg by mouth.  Asencion Islam nasal spray Place 2 sprays daily as needed  . pantoprazole  40 MG tablet Take 1 tablet daily for heart burn, Indigestion and Nausea  . prochlorperazine 10 MG tablet Take 1 tablet 3 to 4 x / day for severe Nausea  . rosuvastatin 20 MG tablet Take 1 tab daily.  . SUMAtriptan (IMITREX) 20 MG nasal  1 DOSE NOW, MAY REPEAT IN 2 HOURS  . ezetimibe  10 MG tablet Take 1 tablet daily for Cholesterol    Allergies  Allergen Reactions  . Banana Nausea And Vomiting  . Effexor [Venlafaxine] Other (See Comments)    insomnia  . Maxalt [Rizatriptan Benzoate] Nausea Only and Other (See Comments)    Nausea and excessive salivation  . Tussin [Guaifenesin]     rash  . Zostavax [Zoster Vaccine Live] Itching and Rash   Past Medical History:  Diagnosis Date  . Allergy   . Anemia   . Anxiety   . Arthritis   . Depression   . Hyperlipidemia   . Hypertension   . Migraines   . Prediabetes   . RLS (restless legs syndrome)   . Vitamin D deficiency    Review of Systems  10 point systems review negative except as above.    Objective:   Physical Exam   BP  124/72   Pulse 72   Temp (!) 97.5 F (36.4 C)   Resp 18   Ht 5\' 6"  (1.676 m)   Wt 150 lb 9.6 oz (68.3 kg)   BMI 24.31 kg/m   HEENT - WNL. Neck - supple.  Chest - Clear equal BS. Cor - Nl HS. RRR w/o sig MGR. PP 1(+). No edema. MS- FROM w/o deformities.  Gait Nl. Neuro -  Nl w/o focal abnormalities.    Assessment & Plan:   1. Pyrosis   2. Migraine without aura and without status migrainosus, not intractable  - Valproic acid level  3. Medication management  - Valproic acid level  4. Need for immunization against influenza  - Flu vaccine HIGH DOSE PF (Fluzone High dose)

## 2017-01-03 NOTE — Patient Instructions (Addendum)
Valproic Acid, Divalproex Sodium delayed or extended-release tablets What is this medicine? DIVALPROEX SODIUM (dye VAL pro ex SO dee um) is used to prevent seizures caused by some forms of epilepsy. It is also used to treat bipolar mania and to prevent migraine headaches. This medicine may be used for other purposes; ask your health care provider or pharmacist if you have questions. COMMON BRAND NAME(S): Depakote, Depakote ER What should I tell my health care provider before I take this medicine? They need to know if you have any of these conditions: -if you often drink alcohol -kidney disease -liver disease -low platelet counts -mitochondrial disease -suicidal thoughts, plans, or attempt; a previous suicide attempt by you or a family member -urea cycle disorder (UCD) -an unusual or allergic reaction to divalproex sodium, sodium valproate, valproic acid, other medicines, foods, dyes, or preservatives -pregnant or trying to get pregnant -breast-feeding How should I use this medicine? Take this medicine by mouth with a drink of water. Follow the directions on the prescription label. Do not cut, crush or chew this medicine. You can take it with or without food. If it upsets your stomach, take it with food. Take your medicine at regular intervals. Do not take it more often than directed. Do not stop taking except on your doctor's advice. A special MedGuide will be given to you by the pharmacist with each prescription and refill. Be sure to read this information carefully each time. Talk to your pediatrician regarding the use of this medicine in children. While this drug may be prescribed for children as young as 10 years for selected conditions, precautions do apply. Overdosage: If you think you have taken too much of this medicine contact a poison control center or emergency room at once. NOTE: This medicine is only for you. Do not share this medicine with others. What if I miss a dose? If you  miss a dose, take it as soon as you can. If it is almost time for your next dose, take only that dose. Do not take double or extra doses. What may interact with this medicine? Do not take this medicine with any of the following medications: -sodium phenylbutyrate This medicine may also interact with the following medications: -aspirin -certain antibiotics like ertapenem, imipenem, meropenem -certain medicines for depression, anxiety, or psychotic disturbances -certain medicines for seizures like carbamazepine, clonazepam, diazepam, ethosuximide, felbamate, lamotrigine, phenobarbital, phenytoin, primidone, rufinamide, topiramate -certain medicines that treat or prevent blood clots like warfarin -cholestyramine -female hormones, like estrogens and birth control pills, patches, or rings -propofol -rifampin -ritonavir -tolbutamide -zidovudine This list may not describe all possible interactions. Give your health care provider a list of all the medicines, herbs, non-prescription drugs, or dietary supplements you use. Also tell them if you smoke, drink alcohol, or use illegal drugs. Some items may interact with your medicine. What should I watch for while using this medicine? Tell your doctor or healthcare professional if your symptoms do not get better or they start to get worse. Wear a medical ID bracelet or chain, and carry a card that describes your disease and details of your medicine and dosage times. You may get drowsy, dizzy, or have blurred vision. Do not drive, use machinery, or do anything that needs mental alertness until you know how this medicine affects you. To reduce dizzy or fainting spells, do not sit or stand up quickly, especially if you are an older patient. Alcohol can increase drowsiness and dizziness. Avoid alcoholic drinks. This medicine can make you   more sensitive to the sun. Keep out of the sun. If you cannot avoid being in the sun, wear protective clothing and use  sunscreen. Do not use sun lamps or tanning beds/booths. Patients and their families should watch out for new or worsening depression or thoughts of suicide. Also watch out for sudden changes in feelings such as feeling anxious, agitated, panicky, irritable, hostile, aggressive, impulsive, severely restless, overly excited and hyperactive, or not being able to sleep. If this happens, especially at the beginning of treatment or after a change in dose, call your health care professional. Women should inform their doctor if they wish to become pregnant or think they might be pregnant. There is a potential for serious side effects to an unborn child. Talk to your health care professional or pharmacist for more information. Women who become pregnant while using this medicine may enroll in the [place] Pregnancy Registry by calling [phone]. This registry collects information about the safety of antiepileptic drug use during pregnancy. What side effects may I notice from receiving this medicine? Side effects that you should report to your doctor or health care professional as soon as possible: -allergic reactions like skin rash, itching or hives, swelling of the face, lips, or tongue -changes in vision -redness, blistering, peeling or loosening of the skin, including inside the mouth -signs and symptoms of liver injury like dark yellow or brown urine; general ill feeling or flu-like symptoms; light-colored stools; loss of appetite; nausea; right upper belly pain; unusually weak or tired; yellowing of the eyes or skin -suicidal thoughts or other mood changes -unusual bleeding or bruising Side effects that usually do not require medical attention (report to your doctor or health care professional if they continue or are bothersome): -constipation -diarrhea -dizziness -hair loss -headache -loss of appetite -weight gain +++++++++++++++++++++++++++++++ Recommend Adult Low Dose  Aspirin or  coated  Aspirin 81 mg daily  To reduce risk of Colon Cancer 20 %,  Skin Cancer 26 % ,  Melanoma 46%  and  Pancreatic cancer 60% +++++++++++++++++++++++++ Vitamin D goal  is between 70-100.  Please make sure that you are taking your Vitamin D as directed.  It is very important as a natural anti-inflammatory  helping hair, skin, and nails, as well as reducing stroke and heart attack risk.  It helps your bones and helps with mood. It also decreases numerous cancer risks so please take it as directed.  Low Vit D is associated with a 200-300% higher risk for CANCER  and 200-300% higher risk for HEART   ATTACK  &  STROKE.   .....................................Marland Kitchen It is also associated with higher death rate at younger ages,  autoimmune diseases like Rheumatoid arthritis, Lupus, Multiple Sclerosis.    Also many other serious conditions, like depression, Alzheimer's Dementia, infertility, muscle aches, fatigue, fibromyalgia - just to name a few. ++++++++++++++++++++ Recommend the book "The END of DIETING" by Dr Excell Seltzer  & the book "The END of DIABETES " by Dr Excell Seltzer At [email or web address] - get book & Audio CD's    Being diabetic has a  300% increased risk for heart attack, stroke, cancer, and alzheimer- type vascular dementia. It is very important that you work harder with diet by avoiding all foods that are white. Avoid white rice (brown & wild rice is OK), white potatoes (sweetpotatoes in moderation is OK), White bread or wheat bread or anything made out of white flour like bagels, donuts, rolls, buns, biscuits, cakes, pastries, cookies, pizza crust,  and pasta (made from white flour & egg whites) - vegetarian pasta or spinach or wheat pasta is OK. Multigrain breads like Arnold's or Pepper[employer], or multigrain sandwich thins or flatbreads.  Diet, exercise and weight loss can reverse and cure diabetes in the early stages.  Diet, exercise and weight loss is very important in the  control and prevention of complications of diabetes which affects every system in your body, ie. Brain - dementia/stroke, eyes - glaucoma/blindness, heart - heart attack/heart failure, kidneys - dialysis, stomach - gastric paralysis, intestines - malabsorption, nerves - severe painful neuritis, circulation - gangrene & loss of a leg(s), and finally cancer and Alzheimers.    I recommend avoid fried & greasy foods,  sweets/candy, white rice (brown or wild rice or Quinoa is OK), white potatoes (sweet potatoes are OK) - anything made from white flour - bagels, doughnuts, rolls, buns, biscuits,white and wheat breads, pizza crust and traditional pasta made of white flour & egg white(vegetarian pasta or spinach or wheat pasta is OK).  Multi-grain bread is OK - like multi-grain flat bread or sandwich thins. Avoid alcohol in excess. Exercise is also important.    Eat all the vegetables you want - avoid meat, especially red meat and dairy - especially cheese.  Cheese is the most concentrated form of trans-fats which is the worst thing to clog up our arteries. Veggie cheese is OK which can be found in the fresh produce section at [employer] or [employer] or [employer]  +++++++++++++++++++++ DASH Eating Plan  DASH stands for "Dietary Approaches to Stop Hypertension."   The DASH eating plan is a healthy eating plan that has been shown to reduce high blood pressure (hypertension). Additional health benefits may include reducing the risk of type 2 diabetes mellitus, heart disease, and stroke. The DASH eating plan may also help with weight loss. WHAT DO I NEED TO KNOW ABOUT THE DASH EATING PLAN? For the DASH eating plan, you will follow these general guidelines:  Choose foods with a percent daily value for sodium of less than 5% (as listed on the food label).  Use salt-free seasonings or herbs instead of table salt or sea salt.  Check with your health care provider or pharmacist before using salt  substitutes.  Eat lower-sodium products, often labeled as "lower sodium" or "no salt added."  Eat fresh foods.  Eat more vegetables, fruits, and low-fat dairy products.  Choose whole grains. Look for the word "whole" as the first word in the ingredient list.  Choose fish   Limit sweets, desserts, sugars, and sugary drinks.  Choose heart-healthy fats.  Eat veggie cheese   Eat more home-cooked food and less restaurant, buffet, and fast food.  Limit fried foods.  Cook foods using methods other than frying.  Limit canned vegetables. If you do use them, rinse them well to decrease the sodium.  When eating at a restaurant, ask that your food be prepared with less salt, or no salt if possible.                      WHAT FOODS CAN I EAT? Read Dr Fara Olden Fuhrman's books on The End of Dieting & The End of Diabetes  Grains Whole grain or whole wheat bread. Brown rice. Whole grain or whole wheat pasta. Quinoa, bulgur, and whole grain cereals. Low-sodium cereals. Corn or whole wheat flour tortillas. Whole grain cornbread. Whole grain crackers. Low-sodium crackers.  Vegetables Fresh or frozen vegetables (raw, steamed, roasted, or  grilled). Low-sodium or reduced-sodium tomato and vegetable juices. Low-sodium or reduced-sodium tomato sauce and paste. Low-sodium or reduced-sodium canned vegetables.   Fruits All fresh, canned (in natural juice), or frozen fruits.  Protein Products  All fish and seafood.  Dried beans, peas, or lentils. Unsalted nuts and seeds. Unsalted canned beans.  Dairy Low-fat dairy products, such as skim or 1% milk, 2% or reduced-fat cheeses, low-fat ricotta or cottage cheese, or plain low-fat yogurt. Low-sodium or reduced-sodium cheeses.  Fats and Oils Tub margarines without trans fats. Light or reduced-fat mayonnaise and salad dressings (reduced sodium). Avocado. Safflower, olive, or canola oils. Natural peanut or almond butter.  Other Unsalted popcorn and  pretzels. The items listed above may not be a complete list of recommended foods or beverages. Contact your dietitian for more options.  +++++++++++++++  WHAT FOODS ARE NOT RECOMMENDED? Grains/ White flour or wheat flour White bread. White pasta. White rice. Refined cornbread. Bagels and croissants. Crackers that contain trans fat.  Vegetables  Creamed or fried vegetables. Vegetables in a . Regular canned vegetables. Regular canned tomato sauce and paste. Regular tomato and vegetable juices.  Fruits Dried fruits. Canned fruit in light or heavy syrup. Fruit juice.  Meat and Other Protein Products Meat in general - RED meat & White meat.  Fatty cuts of meat. Ribs, chicken wings, all processed meats as bacon, sausage, bologna, salami, fatback, hot dogs, bratwurst and packaged luncheon meats.  Dairy Whole or 2% milk, cream, half-and-half, and cream cheese. Whole-fat or sweetened yogurt. Full-fat cheeses or blue cheese. Non-dairy creamers and whipped toppings. Processed cheese, cheese spreads, or cheese curds.  Condiments Onion and garlic salt, seasoned salt, table salt, and sea salt. Canned and packaged gravies. Worcestershire sauce. Tartar sauce. Barbecue sauce. Teriyaki sauce. Soy sauce, including reduced sodium. Steak sauce. Fish sauce. Oyster sauce. Cocktail sauce. Horseradish. Ketchup and mustard. Meat flavorings and tenderizers. Bouillon cubes. Hot sauce. Tabasco sauce. Marinades. Taco seasonings. Relishes.  Fats and Oils Butter, stick margarine, lard, shortening and bacon fat. Coconut, palm kernel, or palm oils. Regular salad dressings.  Pickles and olives. Salted popcorn and pretzels.  The items listed above may not be a complete list of foods and beverages to avoid.

## 2017-01-04 ENCOUNTER — Other Ambulatory Visit: Payer: Self-pay | Admitting: Internal Medicine

## 2017-01-04 DIAGNOSIS — Z79899 Other long term (current) drug therapy: Secondary | ICD-10-CM

## 2017-01-04 DIAGNOSIS — G43009 Migraine without aura, not intractable, without status migrainosus: Secondary | ICD-10-CM

## 2017-01-04 LAB — VALPROIC ACID LEVEL: VALPROIC ACID LVL: 135 mg/L — AB (ref 50.0–100.0)

## 2017-01-05 NOTE — Procedures (Signed)
   [facility]  EEG (ELECTROENCEPHALOGRAM) REPORT   STUDY DATE: 01/02/17 PATIENT NAME: Deborah [name] DOB: [birth year] MRN: [id number]  ORDERING CLINICIAN: Andrey Spearman, MD   TECHNOLOGIST: Laretta Alstrom  TECHNIQUE: Electroencephalogram was recorded utilizing standard 10-20 system of lead placement and reformatted into average and bipolar montages.  RECORDING TIME: 27 minutes  ACTIVATION: hyperventilation and photic stimulation  CLINICAL INFORMATION: 71 year old female with confusion and word finding difficulty.  FINDINGS: Generalized background rhythms ranging 7-8 hertz and 40-50 microvolts. No focal, lateralizing, epileptiform activity or seizures are seen. Patient recorded in the awake and drowsy state. EKG channel shows regular rhythm of 70-80 beats per minute.   IMPRESSION:   Mildly abnormal EEG in the awake and drowsy states demonstrate mild diffuse slowing consistent with mild encephalopathy. This can be seen with toxic, metabolic or neurodegenerative etiologies.    INTERPRETING PHYSICIAN:  Penni Bombard, MD Certified in Neurology, Neurophysiology and Neuroimaging  [facility] [address] [place], [place] [address] [phone]

## 2017-01-10 ENCOUNTER — Telehealth: Payer: Self-pay | Admitting: *Deleted

## 2017-01-10 NOTE — Telephone Encounter (Signed)
-----   Message from Penni Bombard, MD sent at 01/09/2017  5:00 PM EDT ----- No tendency for seizure. Mild general slowing noted. Non-specific. Please call patient. Continue current plan. -VRP

## 2017-01-10 NOTE — Telephone Encounter (Signed)
Spoke to pt and relayed that her EEG showed no tendency for sz, did show mild general slowing, non specific.  Continue current plan of care.  Pt verbalized understanding.

## 2017-01-16 ENCOUNTER — Other Ambulatory Visit: Payer: Self-pay | Admitting: Internal Medicine

## 2017-01-16 NOTE — Telephone Encounter (Signed)
Please call Alpraz  

## 2017-01-18 ENCOUNTER — Other Ambulatory Visit: Payer: Medicare Other

## 2017-01-18 DIAGNOSIS — Z79899 Other long term (current) drug therapy: Secondary | ICD-10-CM

## 2017-01-18 DIAGNOSIS — G43009 Migraine without aura, not intractable, without status migrainosus: Secondary | ICD-10-CM

## 2017-01-19 LAB — VALPROIC ACID LEVEL: Valproic Acid Lvl: 73.4 mg/L (ref 50.0–100.0)

## 2017-01-21 ENCOUNTER — Other Ambulatory Visit: Payer: Self-pay | Admitting: Internal Medicine

## 2017-01-22 ENCOUNTER — Telehealth: Payer: Self-pay | Admitting: *Deleted

## 2017-01-22 NOTE — Telephone Encounter (Signed)
Spoke with the [facility] pharmacist and the patient to inform them it is too soon to refill her Xanax 2 mg XR RX at his time.  The patient picked her refill on 01/16/2017 and states she has the medication and does not need a refill.

## 2017-01-22 NOTE — Telephone Encounter (Signed)
Please call Alpraz  

## 2017-02-03 DIAGNOSIS — I700 Atherosclerosis of aorta: Secondary | ICD-10-CM | POA: Insufficient documentation

## 2017-02-03 NOTE — Progress Notes (Signed)
MEDICARE ANNUAL WELLNESS VISIT AND FOLLOW UP  Assessment:   Essential hypertension - continue medications, DASH diet, exercise and monitor at home. Call if greater than 130/80.  -     CBC with Differential/Platelet -     BASIC METABOLIC PANEL WITH GFR -     Hepatic function panel -     TSH  Hyperlipidemia, unspecified hyperlipidemia type -     Lipid panel -continue medications, check lipids, decrease fatty foods, increase activity.   Medication management -     Magnesium  Prediabetes Discussed general issues about diabetes pathophysiology and management., Educational material distributed., Suggested low cholesterol diet., Encouraged aerobic exercise., Discussed foot care., Reminded to get yearly retinal exam.   Aortic atherosclerosis ([place]) Control blood pressure, cholesterol, glucose, increase exercise.   TIA (transient ischemic attack) Control blood pressure, cholesterol, glucose, increase exercise.   Migraine without aura and without status migrainosus, not intractable Continue meds, discussed rebound HA, do not take rescue more than 2 days a week Try xyzal  Skin cancer Continue follow up  Vitamin D deficiency -     VITAMIN D 25 Hydroxy (Vit-D Deficiency, Fractures)  Generalized anxiety disorder Continue meds  Incontinence in female Doing better  Encounter for Medicare annual wellness exam Getting colonoscopy in [facility]  Mixed hyperlipidemia -     ezetimibe (ZETIA) 10 MG tablet; Take 1 tablet daily for Cholesterol  Over 30 minutes of exam, counseling, chart review, and critical decision making was performed  Plan:   During the course of the visit the patient was educated and counseled about appropriate screening and preventive services including:    Pneumococcal vaccine   Influenza vaccine  Td vaccine  Screening electrocardiogram  Screening mammography  Bone densitometry screening  Colorectal cancer screening  Diabetes screening  Glaucoma  screening  Nutrition counseling   Advanced directives: given info/requested  Subjective:   Deborah [name] is a 71 y.o. female who presents for Medicare Annual Wellness Visit and 3 month follow up on hypertension, prediabetes, hyperlipidemia, vitamin D def.   Her blood pressure has been controlled at home, today their BP is BP: 122/78 She does workout. She denies chest pain, shortness of breath, dizziness.  She is on cholesterol medication and denies myalgias. Her cholesterol is not at goal. The cholesterol last visit was:   Lab Results  Component Value Date   CHOL 178 12/16/2016   HDL 61 12/16/2016   LDLCALC 102 (H) 12/16/2016   TRIG 74 12/16/2016   CHOLHDL 2.9 12/16/2016   She has been working on diet and exercise for prediabetes x 2010, and denies paresthesia of the feet, polydipsia, polyuria and visual disturbances. Last A1C in the office was:  Lab Results  Component Value Date   HGBA1C 5.6 12/16/2016   Patient is on Vitamin D supplement, she decreased last visit to 2000 a day.  Lab Results  Component Value Date   VD25OH 117 (H) 10/26/2016   She has a history of migraines, was in the hospital recently for this, had CT scan negative, had normal EEG, on the depakote and have improved some. On sinus medication as well.  [facility]s [facility], goes once a year.  She does have stress/over flow incontinence.  She is on xanax PRN for anxiety and uses for sleep.  Wt Readings from Last 3 Encounters:  02/05/17 156 lb 3.2 oz (70.9 kg)  01/03/17 150 lb 9.6 oz (68.3 kg)  12/27/16 151 lb (68.5 kg)  Medication Review Current Outpatient Prescriptions on File Prior to Visit  Medication Sig Dispense Refill  . ALPRAZolam (XANAX XR) 2 MG 24 hr tablet TAKE 1 TABLET BY MOUTH IN THE MORNING FOR ANXIETY ATTACKS 30 tablet 2  . B Complex Vitamins (VITAMIN-B COMPLEX PO) Take 1 capsule by mouth daily.    Marland Kitchen BIOTIN PO Take by mouth. Patient takes  10000 mcg plus Keratin 1 daily    . Cholecalciferol (VITAMIN D PO) Take 5,000 Units by mouth daily.    . divalproex (DEPAKOTE) 500 MG DR tablet Take 1 tablet (500 mg total) by mouth at bedtime. 90 tablet 4  . famotidine (PEPCID) 40 MG tablet Take 40 mg by mouth.    . fluticasone (FLONASE) 50 MCG/ACT nasal spray Place 2 sprays into both nostrils at bedtime. (Patient taking differently: Place 2 sprays into both nostrils daily as needed. ) 16 g 1  . pantoprazole (PROTONIX) 40 MG tablet Take 1 tablet daily for heart burn, Indigestion and Nausea 90 tablet 1  . prochlorperazine (COMPAZINE) 10 MG tablet Take 1 tablet 3 to 4 x / day for severe Nausea 90 tablet 1  . rosuvastatin (CRESTOR) 20 MG tablet Take 1 tablet (20 mg total) by mouth daily. 90 tablet 1  . SUMAtriptan (IMITREX) 20 MG/ACT nasal spray USE ONE DOSE IN THE NOSE NOW, MAY REPEAT IN 2 HOURS, MAX OF 2 DOSES IN 24 HOURS 12 Inhaler 0  . UNABLE TO FIND Outpatient physical therapy  Diagnosis: cerebral amyloid angiopathy.gait instability 1 Mutually Defined 0  . ezetimibe (ZETIA) 10 MG tablet Take 1 tablet daily for Cholesterol (Patient taking differently: Take 10 mg by mouth daily. Take 1 tablet daily for Cholesterol) 90 tablet 1   No current facility-administered medications on file prior to visit.     Current Problems (verified) Patient Active Problem List   Diagnosis Date Noted  . Aortic atherosclerosis ([place]) 02/03/2017  . TIA (transient ischemic attack) 12/15/2016  . BMI 26.0-26.9,adult 03/12/2015  . Skin cancer 08/06/2014  . Migraine 08/06/2014  . Generalized anxiety disorder 08/06/2014  . Incontinence in female 08/06/2014  . Prediabetes 07/01/2013  . Medication management 07/01/2013  . Hypertension   . Hyperlipidemia   . Vitamin D deficiency     Screening Tests Immunization History  Administered Date(s) Administered  . Influenza, High Dose Seasonal PF 01/01/2014, 03/15/2015, 12/13/2015, 01/03/2017  . Influenza-Unspecified  12/26/2012  . Pneumococcal Conjugate-13 08/06/2014  . Pneumococcal-Unspecified 12/26/2012  . Td 12/26/2012  . Zoster 12/08/2010   Preventative care: Last colonoscopy: 11/2006 Has appt with Dr. Earlean Shawl in [facility] EGD 10/2016 Last mammogram: 08/2016 Last pap smear/pelvic exam: 2013  DEXA:10/2006 \\MRI  12/2016 Ct head 11/2016  Prior vaccinations: TD : 12/2012 Influenza: 12/2016 Pneumococcal: 12/2012 Prevnar 13: 2016  Shingles/Zostavax: 11/2010  Names of Other Physician/Practitioners you currently use: 1. [facility]- here for primary care 2. Dr Shirley Muscat, eye doctor, last visit 2017 3. Dr Randie Heinz, dentist, last visit 2017 Patient Care Team: Unk Pinto, MD as PCP - General (Internal Medicine) Richmond Campbell, MD as Consulting Physician (Gastroenterology) Shirley Muscat Loreen Freud, MD as Referring Physician (Optometry) Berenice Primas, MD as Referring Physician (Orthopedic Surgery) Janann August, MD as Referring Physician (Dermatology)  Allergies Allergies  Allergen Reactions  . Banana Nausea And Vomiting  . Effexor [Venlafaxine] Other (See Comments)    insomnia  . Maxalt [Rizatriptan Benzoate] Nausea Only and Other (See Comments)    Nausea and excessive salivation  . Tussin [Guaifenesin]  rash  . Zostavax [Zoster Vaccine Live] Itching and Rash    SURGICAL HISTORY She  has a past surgical history that includes Spine surgery (2010); Mohs surgery (Right, 04/2013); and Knee arthroscopy (Bilateral, 04/2014). FAMILY HISTORY Her family history includes Diabetes in her mother; Heart disease in her father and mother; Hyperlipidemia in her sister; Hypertension in her brother and father; Stroke in her father. SOCIAL HISTORY She  reports that she quit smoking about 30 years ago. Her smoking use included Cigarettes. She has a 4.00 pack-year smoking history. She has never used smokeless tobacco. She reports that she drinks alcohol. She  reports that she does not use drugs.  MEDICARE WELLNESS OBJECTIVES: Depression/mood screen:   Depression screen [facility] 2/9 01/03/2017  Decreased Interest 0  Down, Depressed, Hopeless 0  PHQ - 2 Score 0   Hearing: decreased Visual acuity: normal,  does perform annual eye exam  ADLs:  In your present state of health, do you have any difficulty performing the following activities: 01/03/2017 12/30/2016  Hearing? N N  Vision? N N  Difficulty concentrating or making decisions? Y N  Walking or climbing stairs? N N  Dressing or bathing? N N  Doing errands, shopping? N N  Some recent data might be hidden    Fall risk: Low- moderate Risk Cognitive Testing  Alert? Yes  Normal Appearance?Yes  Oriented to person? Yes  Place? Yes   Time? Yes  Recall of three objects?  Yes  Can perform simple calculations? Yes  Displays appropriate judgment?Yes  Can read the correct time from a watch face?Yes  EOL planning: Does Patient Have a Medical Advance Directive?: No Would patient like information on creating a medical advance directive?: Yes (Inpatient - patient requests chaplain consult to create a medical advance directive), No - Patient declined     Objective:   Blood pressure 122/78, pulse 78, temperature (!) 97.5 F (36.4 C), resp. rate 16, height 5\' 6"  (1.676 m), weight 156 lb 3.2 oz (70.9 kg), SpO2 98 %. Body mass index is 25.21 kg/m.  General appearance: alert, no distress, WD/WN,  female HEENT: normocephalic, sclerae anicteric, TMs pearly, nares patent, no discharge or erythema, pharynx normal Oral cavity: MMM, no lesions Neck: supple, no lymphadenopathy, no thyromegaly, no masses Heart: RRR, normal S1, S2, no murmurs Lungs: CTA bilaterally, no wheezes, rhonchi, or rales Abdomen: +bs, soft, non tender, non distended, no masses, no hepatomegaly, no splenomegaly Musculoskeletal: nontender, no swelling, no obvious deformity Extremities: no edema, no cyanosis, no clubbing Pulses: 2+  symmetric, upper and lower extremities, normal cap refill Neurological: alert, oriented x 3, CN2-12 intact, strength normal upper extremities and lower extremities, sensation normal throughout, DTRs 2+ throughout, no cerebellar signs, gait normal Psychiatric: normal affect, behavior normal, pleasant  Breast: defer Gyn: defer Rectal: defer   Medicare Attestation I have personally reviewed: The patient's medical and social history Their use of alcohol, tobacco or illicit drugs Their current medications and supplements The patient's functional ability including ADLs,fall risks, home safety risks, cognitive, and hearing and visual impairment Diet and physical activities Evidence for depression or mood disorders  The patient's weight, height, BMI, and visual acuity have been recorded in the chart.  I have made referrals, counseling, and provided education to the patient based on review of the above and I have provided the patient with a written personalized care plan for preventive services.     Vicie Mutters, PA-C   02/05/2017

## 2017-02-05 ENCOUNTER — Encounter: Payer: Self-pay | Admitting: Physician Assistant

## 2017-02-05 ENCOUNTER — Ambulatory Visit (INDEPENDENT_AMBULATORY_CARE_PROVIDER_SITE_OTHER): Payer: Medicare Other | Admitting: Physician Assistant

## 2017-02-05 ENCOUNTER — Other Ambulatory Visit: Payer: Self-pay | Admitting: Internal Medicine

## 2017-02-05 VITALS — BP 122/78 | HR 78 | Temp 97.5°F | Resp 16 | Ht 66.0 in | Wt 156.2 lb

## 2017-02-05 DIAGNOSIS — R7303 Prediabetes: Secondary | ICD-10-CM | POA: Diagnosis not present

## 2017-02-05 DIAGNOSIS — Z0001 Encounter for general adult medical examination with abnormal findings: Secondary | ICD-10-CM

## 2017-02-05 DIAGNOSIS — C4490 Unspecified malignant neoplasm of skin, unspecified: Secondary | ICD-10-CM

## 2017-02-05 DIAGNOSIS — R32 Unspecified urinary incontinence: Secondary | ICD-10-CM | POA: Diagnosis not present

## 2017-02-05 DIAGNOSIS — F411 Generalized anxiety disorder: Secondary | ICD-10-CM

## 2017-02-05 DIAGNOSIS — G459 Transient cerebral ischemic attack, unspecified: Secondary | ICD-10-CM | POA: Diagnosis not present

## 2017-02-05 DIAGNOSIS — I10 Essential (primary) hypertension: Secondary | ICD-10-CM | POA: Diagnosis not present

## 2017-02-05 DIAGNOSIS — Z79899 Other long term (current) drug therapy: Secondary | ICD-10-CM

## 2017-02-05 DIAGNOSIS — G43009 Migraine without aura, not intractable, without status migrainosus: Secondary | ICD-10-CM

## 2017-02-05 DIAGNOSIS — E785 Hyperlipidemia, unspecified: Secondary | ICD-10-CM | POA: Diagnosis not present

## 2017-02-05 DIAGNOSIS — R6889 Other general symptoms and signs: Secondary | ICD-10-CM

## 2017-02-05 DIAGNOSIS — E559 Vitamin D deficiency, unspecified: Secondary | ICD-10-CM

## 2017-02-05 DIAGNOSIS — I700 Atherosclerosis of aorta: Secondary | ICD-10-CM

## 2017-02-05 DIAGNOSIS — E782 Mixed hyperlipidemia: Secondary | ICD-10-CM | POA: Diagnosis not present

## 2017-02-05 DIAGNOSIS — Z0000 Encounter for general adult medical examination without abnormal findings: Secondary | ICD-10-CM

## 2017-02-05 MED ORDER — EZETIMIBE 10 MG PO TABS
ORAL_TABLET | ORAL | 1 refills | Status: DC
Start: 2017-02-05 — End: 2017-09-04

## 2017-02-05 NOTE — Patient Instructions (Signed)
To prevent migraines you can try these natural/OTC medications as prevention: 1) Melatonin 5mg -15mg  30 mins before bed 2) Riboflavin/B2 400mg  daily 3) CoQ10 100mg  3 x a day  We may also treat TMJ if we think you have it If you are having frequent migraines we may put you on a once a day medication with fast acting medication to take. Here is more information below  Please remember, common headache triggers are: sleep deprivation, dehydration, overheating, stress, hypoglycemia or skipping meals and blood sugar fluctuations, excessive pain medications or excessive alcohol use or caffeine withdrawal. Some people have food triggers such as aged cheese, orange juice or chocolate, especially dark chocolate, or MSG (monosodium glutamate). Try to avoid these headache triggers as much possible. It may be helpful to keep a headache diary to figure out what makes your headaches worse or brings them on and what alleviates them. Some people report headache onset after exercise but studies have shown that regular exercise may actually prevent headaches from coming. If you have exercise-induced headaches, please make sure that you drink plenty of fluid before and after exercising and that you do not over do it and do not overheat.    Migraine Headache A migraine headache is an intense, throbbing pain on one or both sides of your head. Recurrent migraines keep coming back. A migraine can last for 30 minutes to several hours. CAUSES  The exact cause of a migraine headache is not always known. However, a migraine may be caused when nerves in the brain become irritated and release chemicals that cause inflammation. This causes pain. Certain things may also trigger migraines, such as:   Alcohol.  Smoking.  Stress.  Menstruation.  Aged cheeses.  Foods or drinks that contain nitrates, glutamate, aspartame, or tyramine.  Lack of sleep.  Chocolate.  Caffeine.  Hunger.  Physical  exertion.  Fatigue.  Medicines used to treat chest pain (nitroglycerine), birth control pills, estrogen, and some blood pressure medicines. SYMPTOMS   Pain on one or both sides of your head.  Pulsating or throbbing pain.  Severe pain that prevents daily activities.  Pain that is aggravated by any physical activity.  Nausea, vomiting, or both.  Dizziness.  Pain with exposure to bright lights, loud noises, or activity.  General sensitivity to bright lights, loud noises, or smells. Before you get a migraine, you may get warning signs that a migraine is coming (aura). An aura may include:  Seeing flashing lights.  Seeing bright spots, halos, or zigzag lines.  Having tunnel vision or blurred vision.  Having feelings of numbness or tingling.  Having trouble talking.  Having muscle weakness. DIAGNOSIS  A recurrent migraine headache is often diagnosed based on:  Symptoms.  Physical examination.  A CT scan or MRI of your head. These imaging tests cannot diagnose migraines but can help rule out other causes of headaches.  TREATMENT  Medicines may be given for pain and nausea. Medicines can also be given to help prevent recurrent migraines. HOME CARE INSTRUCTIONS  Only take over-the-counter or prescription medicines for pain or discomfort as directed by your health care provider. The use of long-term narcotics is not recommended.  Lie down in a dark, quiet room when you have a migraine.  Keep a journal to find out what may trigger your migraine headaches. For example, write down:  What you eat and drink.  How much sleep you get.  Any change to your diet or medicines.  Limit alcohol consumption.  Quit smoking if  you smoke.  Get 7-9 hours of sleep, or as recommended by your health care provider.  Limit stress.  Keep lights dim if bright lights bother you and make your migraines worse. SEEK MEDICAL CARE IF:   You do not get relief from the medicines given to  you.  You have a recurrence of pain.  You have a fever. SEEK IMMEDIATE MEDICAL CARE IF:  Your migraine becomes severe.  You have a stiff neck.  You have loss of vision.  You have muscular weakness or loss of muscle control.  You start losing your balance or have trouble walking.  You feel faint or pass out. You have severe symptoms that are different from your first symptoms. MAKE SURE YOU:   Understand these instructions.  Will watch your condition.  Will get help right away if you are not doing well or get worse.   This information is not intended to replace advice given to you by your health care provider. Make sure you discuss any questions you have with your health care provider.   Document Released: 12/27/2000 Document Revised: 04/24/2014 Document Reviewed: 12/09/2012 Elsevier Interactive Patient Education 2016 [employer].  Common Migraine Triggers   Foods Aged cheese, alcohol, nuts, chocolate, yogurt, onions, figs, liver, caffeinated foods and beverages, monosodium glutamate (MSG), smoked or pickled fish/meat, nitrate/nitrate preserved foods (hotdogs, pepperoni, salami) tyramine  Medications Antibiotics (tetracycline, griseofulvin), antihypertensives (nifedipine, captopril), hormones (oral contraceptives, estrogens), histamine-2 blockers (cimetidine, raniidine, vasodilators (nitroglycerine, isosorbide dinitrate)  Sensory Stimuli Flickering/bright/fluorescent lights, bright sunlight, odors (perfume, chemicals, cigarette smoke)  Lifestyle Changes Time zones, sleep patterns, eating habits, caffeine withdrawal stress  Other Menstrual cycle, weather/season/air pressure changes, high altitude  Adapted from [place] and [place], [place]. [facility] [place]; [place] and [place]. Conquering Headache, 1998  Hormonal variations also are believed to play a part.  Fluctuations of the female hormone estrogen (such as just before menstruation) affect a chemical called serotonin-when  serotonin levels in the brain fall, the dilation (expansion) of blood vessels in the brain that is characteristic of migraine often follows.  Many factors or "triggers" can start a migraine.  In people who get migraines, most experts think certain activities or foods may trigger temporary changes in the blood vessels around the brain.  Swelling of these blood vessels may cause pain in the nearby nerves.  Allergy Headaches:  Hotdogs Milk  Onions  Thyme Bacon  Chocolate Garlic  Nutmeg Ham  Dark Cola Pork  Cinnamon Salami  Nuts  Egg  Ginger Sausage Red wine Cloves  Cheddar Cheese Caffeine

## 2017-02-06 LAB — CBC WITH DIFFERENTIAL/PLATELET
Basophils Absolute: 52 cells/uL (ref 0–200)
Basophils Relative: 0.9 %
EOS ABS: 191 {cells}/uL (ref 15–500)
Eosinophils Relative: 3.3 %
HEMATOCRIT: 38.2 % (ref 35.0–45.0)
HEMOGLOBIN: 13 g/dL (ref 11.7–15.5)
LYMPHS ABS: 1566 {cells}/uL (ref 850–3900)
MCH: 29.7 pg (ref 27.0–33.0)
MCHC: 34 g/dL (ref 32.0–36.0)
MCV: 87.4 fL (ref 80.0–100.0)
MPV: 9.9 fL (ref 7.5–12.5)
Monocytes Relative: 11.9 %
NEUTROS ABS: 3300 {cells}/uL (ref 1500–7800)
Neutrophils Relative %: 56.9 %
Platelets: 271 10*3/uL (ref 140–400)
RBC: 4.37 10*6/uL (ref 3.80–5.10)
RDW: 12.5 % (ref 11.0–15.0)
Total Lymphocyte: 27 %
WBC: 5.8 10*3/uL (ref 3.8–10.8)
WBCMIX: 690 {cells}/uL (ref 200–950)

## 2017-02-06 LAB — HEPATIC FUNCTION PANEL
AG Ratio: 1.7 (calc) (ref 1.0–2.5)
ALKALINE PHOSPHATASE (APISO): 52 U/L (ref 33–130)
ALT: 7 U/L (ref 6–29)
AST: 17 U/L (ref 10–35)
Albumin: 4.3 g/dL (ref 3.6–5.1)
BILIRUBIN DIRECT: 0.1 mg/dL (ref 0.0–0.2)
BILIRUBIN INDIRECT: 0.4 mg/dL (ref 0.2–1.2)
BILIRUBIN TOTAL: 0.5 mg/dL (ref 0.2–1.2)
Globulin: 2.5 g/dL (calc) (ref 1.9–3.7)
Total Protein: 6.8 g/dL (ref 6.1–8.1)

## 2017-02-06 LAB — BASIC METABOLIC PANEL WITH GFR
BUN / CREAT RATIO: 19 (calc) (ref 6–22)
BUN: 18 mg/dL (ref 7–25)
CO2: 31 mmol/L (ref 20–32)
Calcium: 9.6 mg/dL (ref 8.6–10.4)
Chloride: 96 mmol/L — ABNORMAL LOW (ref 98–110)
Creat: 0.94 mg/dL — ABNORMAL HIGH (ref 0.60–0.93)
GFR, EST NON AFRICAN AMERICAN: 61 mL/min/{1.73_m2} (ref >=60)
GFR, Est African American: 71 mL/min/{1.73_m2} (ref >=60)
Glucose, Bld: 77 mg/dL (ref 65–99)
POTASSIUM: 4.6 mmol/L (ref 3.5–5.3)
Sodium: 134 mmol/L — ABNORMAL LOW (ref 135–146)

## 2017-02-06 LAB — LIPID PANEL
Cholesterol: 163 mg/dL (ref <200)
HDL: 74 mg/dL (ref >50)
LDL Cholesterol (Calc): 71 mg/dL (calc)
NON-HDL CHOLESTEROL (CALC): 89 mg/dL (ref <130)
Total CHOL/HDL Ratio: 2.2 (calc) (ref <5.0)
Triglycerides: 95 mg/dL (ref <150)

## 2017-02-06 LAB — MAGNESIUM: MAGNESIUM: 2.2 mg/dL (ref 1.5–2.5)

## 2017-02-06 LAB — VITAMIN D 25 HYDROXY (VIT D DEFICIENCY, FRACTURES): VIT D 25 HYDROXY: 87 ng/mL (ref 30–100)

## 2017-02-06 LAB — TSH: TSH: 1.76 mIU/L (ref 0.40–4.50)

## 2017-02-27 ENCOUNTER — Encounter: Payer: Self-pay | Admitting: Diagnostic Neuroimaging

## 2017-02-27 ENCOUNTER — Ambulatory Visit: Payer: Medicare Other | Admitting: Diagnostic Neuroimaging

## 2017-02-27 VITALS — BP 127/86 | HR 84 | Ht 66.0 in | Wt 155.8 lb

## 2017-02-27 DIAGNOSIS — R413 Other amnesia: Secondary | ICD-10-CM | POA: Diagnosis not present

## 2017-02-27 DIAGNOSIS — R410 Disorientation, unspecified: Secondary | ICD-10-CM

## 2017-02-27 DIAGNOSIS — G43009 Migraine without aura, not intractable, without status migrainosus: Secondary | ICD-10-CM

## 2017-02-27 MED ORDER — DIVALPROEX SODIUM 500 MG PO DR TAB: 500.0000 mg | DELAYED_RELEASE_TABLET | Freq: Every day | ORAL | 4 refills | Status: DC

## 2017-02-27 NOTE — Progress Notes (Signed)
[facility]  PATIENT: Deborah [name] DOB: 08/06/1945  REFERRING CLINICIAN: Mike Craze HISTORY FROM: patient and chart review REASON FOR VISIT: follow up   HISTORICAL  CHIEF COMPLAINT:  Chief Complaint  Patient presents with  . Follow-up    Patient reports that she is still having many headaches.     HISTORY OF PRESENT ILLNESS:   UPDATE (02/27/17, VRP): Since last visit, doing better. Tolerating divalproex. No alleviating or aggravating factors. No more seizures. Memory loss improved. Still with a lot of headaches (right or left side; sharp pain; with nausea/vomiting; with photo/phonophobia; 3-4 days per week; hours-days; no aura; triggers --> chocolate, bananas; headaches started at age 65 years old; + fam hx of migraine mother side of family).   PRIOR HPI (12/25/16): 71 year old female here for evaluation of confusion and word finding difficulty. Patient was admitted to the hospital on 12/15/16 for headache, confusion, word finding difficulty. Apparently she woke up, went to the grocery, and when she returned home felt confused. She apparently talked to her son over the phone who could not understand what she was saying. Patient went to the neighbor's house and they were able to call for help. MRI of the brain showed evidence of possible cerebral amyloid angiopathy. Patient was diagnosed with possible underlying neurodegenerative process with superimposed confusion related to topiramate. Complex partial seizure was also raised as a possibility. She was discharged on Depakote instead of Topamax. Patient also reports history of headaches and migraines at least once per month.   REVIEW OF SYSTEMS: Full 14 system review of systems performed and negative with exception of: memory loss headache  ALLERGIES: Allergies  Allergen Reactions  . Banana Nausea And Vomiting  . Effexor [Venlafaxine] Other (See Comments)    insomnia  . Maxalt [Rizatriptan Benzoate] Nausea  Only and Other (See Comments)    Nausea and excessive salivation  . Tussin [Guaifenesin]     rash  . Zostavax [Zoster Vaccine Live] Itching and Rash    HOME MEDICATIONS: Outpatient Medications Prior to Visit  Medication Sig Dispense Refill  . ALPRAZolam (XANAX XR) 2 MG 24 hr tablet TAKE 1 TABLET BY MOUTH IN THE MORNING FOR ANXIETY ATTACKS 30 tablet 2  . B Complex Vitamins (VITAMIN-B COMPLEX PO) Take 1 capsule by mouth daily.    Marland Kitchen BIOTIN PO Take by mouth. Patient takes 10000 mcg plus Keratin 1 daily    . Cholecalciferol (VITAMIN D PO) Take 5,000 Units by mouth daily.    . divalproex (DEPAKOTE) 500 MG DR tablet Take 1 tablet (500 mg total) by mouth at bedtime. 90 tablet 4  . ezetimibe (ZETIA) 10 MG tablet Take 1 tablet daily for Cholesterol 90 tablet 1  . famotidine (PEPCID) 40 MG tablet Take 40 mg by mouth.    . fluticasone (FLONASE) 50 MCG/ACT nasal spray Place 2 sprays into both nostrils at bedtime. (Patient taking differently: Place 2 sprays into both nostrils daily as needed. ) 16 g 1  . pantoprazole (PROTONIX) 40 MG tablet Take 1 tablet daily for heart burn, Indigestion and Nausea 90 tablet 1  . prochlorperazine (COMPAZINE) 10 MG tablet Take 1 tablet 3 to 4 x / day for severe Nausea 90 tablet 1  . rosuvastatin (CRESTOR) 20 MG tablet Take 1 tablet (20 mg total) by mouth daily. 90 tablet 1  . SUMAtriptan (IMITREX) 20 MG/ACT nasal spray USE ONE DOSE IN THE NOSE NOW, MAY REPEAT IN 2 HOURS. MAX OF 2 DOSES IN 24 HOURS. 12 Inhaler 2  .  UNABLE TO FIND Outpatient physical therapy  Diagnosis: cerebral amyloid angiopathy.gait instability 1 Mutually Defined 0   No facility-administered medications prior to visit.     PAST MEDICAL HISTORY: Past Medical History:  Diagnosis Date  . Allergy   . Anemia   . Anxiety   . Arthritis   . Depression   . Hyperlipidemia   . Hypertension   . Migraines   . Prediabetes   . RLS (restless legs syndrome)   . Vitamin D deficiency     PAST SURGICAL  HISTORY: Past Surgical History:  Procedure Laterality Date  . KNEE ARTHROSCOPY Bilateral 04/2014   [facility]  . MOHS SURGERY Right 04/2013   Squamous cell cacinoma  . SPINE SURGERY  2010   C6-C7 fusion    FAMILY HISTORY: Family History  Problem Relation Age of Onset  . Diabetes Mother   . Heart disease Mother   . Heart disease Father   . Stroke Father   . Hypertension Father   . Hyperlipidemia Sister   . Hypertension Brother     SOCIAL HISTORY:  Social History   Socioeconomic History  . Marital status: Married    Spouse name: Not on file  . Number of children: Not on file  . Years of education: Not on file  . Highest education level: Not on file  Social Needs  . Financial resource strain: Not on file  . Food insecurity - worry: Not on file  . Food insecurity - inability: Not on file  . Transportation needs - medical: Not on file  . Transportation needs - non-medical: Not on file  Occupational History  . Not on file  Tobacco Use  . Smoking status: Former Smoker    Packs/day: 1.00    Years: 4.00    Pack years: 4.00    Types: Cigarettes    Last attempt to quit: 03/25/1986    Years since quitting: 30.9  . Smokeless tobacco: Never Used  Substance and Sexual Activity  . Alcohol use: Yes    Comment: occasional  . Drug use: No  . Sexual activity: Not on file  Other Topics Concern  . Not on file  Social History Narrative  . Not on file     PHYSICAL EXAM  GENERAL EXAM/CONSTITUTIONAL: Vitals:  Vitals:   02/27/17 0842  BP: 127/86  Pulse: 84  Weight: 155 lb 12.8 oz (70.7 kg)  Height: 5\' 6"  (1.676 m)   Body mass index is 25.15 kg/m. No exam data present  Patient is in no distress; well developed, nourished and groomed; neck is supple  CARDIOVASCULAR:  Examination of carotid arteries is normal; no carotid bruits  Regular rate and rhythm, no murmurs  Examination of peripheral vascular system by observation and palpation is  normal  EYES:  Ophthalmoscopic exam of optic discs and posterior segments is normal; no papilledema or hemorrhages  MUSCULOSKELETAL:  Gait, strength, tone, movements noted in Neurologic exam below  NEUROLOGIC: MENTAL STATUS:  MMSE - Mini Mental State Exam 02/27/2017 12/25/2016  Orientation to time 3 4  Orientation to Place 5 5  Registration 3 3  Attention/ Calculation 1 1  Recall 1 1  Language- name 2 objects 2 2  Language- repeat 1 1  Language- follow 3 step command 3 3  Language- read & follow direction 1 1  Write a sentence 1 1  Copy design 1 0  Total score 22 22    awake, alert, oriented to person, place and time  [facility] RECALL  MEMORY  DECR attention and concentration  language fluent, comprehension intact, naming intact   fund of knowledge appropriate  CRANIAL NERVE:   2nd - no papilledema on fundoscopic exam  2nd, 3rd, 4th, 6th - pupils equal and reactive to light, visual fields full to confrontation, extraocular muscles intact, no nystagmus  5th - facial sensation symmetric  7th - facial strength symmetric  8th - hearing intact  9th - palate elevates symmetrically, uvula midline  11th - shoulder shrug symmetric  12th - tongue protrusion midline  MOTOR:   normal bulk and tone, full strength in the BUE, BLE  SENSORY:   normal and symmetric to light touch, temperature, vibration    COORDINATION:   finger-nose-finger, fine finger movements normal  REFLEXES:   deep tendon reflexes present and symmetric  GAIT/STATION:   narrow based gait; romberg is negative    DIAGNOSTIC DATA (LABS, IMAGING, TESTING) - I reviewed patient records, labs, notes, testing and imaging myself where available.  Lab Results  Component Value Date   WBC 5.8 02/05/2017   HGB 13.0 02/05/2017   HCT 38.2 02/05/2017   MCV 87.4 02/05/2017   PLT 271 02/05/2017      Component Value Date/Time   NA 134 (L) 02/05/2017 1139   K 4.6 02/05/2017 1139   CL 96 (L)  02/05/2017 1139   CO2 31 02/05/2017 1139   GLUCOSE 77 02/05/2017 1139   BUN 18 02/05/2017 1139   CREATININE 0.94 (H) 02/05/2017 1139   CALCIUM 9.6 02/05/2017 1139   PROT 6.8 02/05/2017 1139   ALBUMIN 4.1 12/15/2016 1710   AST 17 02/05/2017 1139   ALT 7 02/05/2017 1139   ALKPHOS 63 12/15/2016 1710   BILITOT 0.5 02/05/2017 1139   GFRNONAA 61 02/05/2017 1139   GFRAA 71 02/05/2017 1139   Lab Results  Component Value Date   CHOL 163 02/05/2017   HDL 74 02/05/2017   LDLCALC 102 (H) 12/16/2016   TRIG 95 02/05/2017   CHOLHDL 2.2 02/05/2017   Lab Results  Component Value Date   HGBA1C 5.6 12/16/2016   Lab Results  Component Value Date   TWSFKCLE75 170 12/16/2016   Lab Results  Component Value Date   TSH 1.76 02/05/2017    12/16/16 MRI brain [I reviewed images myself and agree with interpretation. -VRP]  1. Diffuse peripheral supratentorial distribution of chronic microhemorrhage. The pattern is consistent with cerebral amyloid angiopathy and is atypical in distribution for chronic hypertensive encephalopathy. There is a single larger 5 mm focus of chronic hemorrhage within left lateral temporal lobe. 2. Moderate chronic microvascular ischemic changes and moderate parenchymal volume loss of the brain. 3. No acute intracranial abnormality identified.  01/02/17 EEG - Mildly abnormal EEG in the awake and drowsy states demonstrate mild diffuse slowing consistent with mild encephalopathy. This can be seen with toxic, metabolic or neurodegenerative etiologies.      ASSESSMENT AND PLAN  71 y.o. year old female here with new onset confusion, word finding difficulty, memory loss, on 12/15/16. This could have been related to seizure. Also with subacute to chronic confusion and short-term memory loss for at least 1-2 years, likely related to underlying neurodegenerative process. MMSE 22 out of 30.     Dx:  1. Confusion   2. Memory loss   3. Migraine without aura and without status  migrainosus, not intractable      PLAN:  CONFUSION SPELL (? seizure vs migraine vs metabolic) - continue divalproex 500mg  at bedtime (could not tolerate twice a  day due to nausea)  MIGRAINE WITHOUT AURA - continue divalproex - holistic headache techniques reviewed - To prevent or relieve headaches, try the following:  Cool Compress. Lie down and place a cool compress on your head.   Avoid headache triggers. If certain foods or odors seem to have triggered your migraines in the past, avoid them. A headache diary might help you identify triggers.   Include physical activity in your daily routine.   Manage stress. Find healthy ways to cope with the stressors, such as delegating tasks on your to-do list.   Practice relaxation techniques. Try deep breathing, yoga, massage and visualization.   Eat regularly. Eating regularly scheduled meals and maintaining a healthy diet might help prevent headaches. Also, drink plenty of fluids.   Follow a regular sleep schedule. Sleep deprivation might contribute to headaches  Consider biofeedback. With this mind-body technique, you learn to control certain bodily functions - such as muscle tension, heart rate and blood pressure - to prevent headaches or reduce headache pain.  MEMORY LOSS (neurodegenerative vs metabolic) - some subjective improvement per patient - computer based cognitive testing --> braincheck - will request family to be present for next visit - brain healthy activities reviewed  Meds ordered this encounter  Medications  . divalproex (DEPAKOTE) 500 MG DR tablet    Sig: Take 1 tablet (500 mg total) at bedtime by mouth.    Dispense:  90 tablet    Refill:  4   Orders Placed This Encounter  Procedures  . Neuropsychological testing    Standing Status:   Future    Standing Expiration Date:   02/27/2018    Scheduling Instructions:     braincheck testing    Order Specific Question:   Where should this test be performed     Answer:   [facility]   Return in about 1 year (around 02/27/2018).    Penni Bombard, MD 65/78/4696, 2:95 AM Certified in Neurology, Neurophysiology and Neuroimaging  [facility] [address], [place], [place] [address] [phone]

## 2017-02-27 NOTE — Addendum Note (Signed)
Addended by: Andrey Spearman R on: 02/27/2017 03:39 PM   Modules accepted: Orders

## 2017-02-27 NOTE — Patient Instructions (Signed)
CONFUSION SPELL (? seizure vs migraine vs metabolic) - continue divalproex 500mg  at bedtime (could not tolerate twice a day due to nausea)  MIGRAINE WITHOUT AURA - continue divalproex - holistic headache techniques reviewed - To prevent or relieve headaches, try the following:  Cool Compress. Lie down and place a cool compress on your head.   Avoid headache triggers. If certain foods or odors seem to have triggered your migraines in the past, avoid them. A headache diary might help you identify triggers.   Include physical activity in your daily routine.   Manage stress. Find healthy ways to cope with the stressors, such as delegating tasks on your to-do list.   Practice relaxation techniques. Try deep breathing, yoga, massage and visualization.   Eat regularly. Eating regularly scheduled meals and maintaining a healthy diet might help prevent headaches. Also, drink plenty of fluids.   Follow a regular sleep schedule. Sleep deprivation might contribute to headaches  Consider biofeedback. With this mind-body technique, you learn to control certain bodily functions - such as muscle tension, heart rate and blood pressure - to prevent headaches or reduce headache pain.  MEMORY LOSS - computer based cognitive testing --> braincheck - will request family to be present for next visit - brain healthy activities reviewed

## 2017-02-28 ENCOUNTER — Other Ambulatory Visit: Payer: Self-pay | Admitting: Physician Assistant

## 2017-02-28 MED ORDER — LEVOCETIRIZINE DIHYDROCHLORIDE 5 MG PO TABS: 5.0000 mg | ORAL_TABLET | Freq: Every evening | ORAL | 4 refills | Status: DC

## 2017-02-28 NOTE — Progress Notes (Signed)
Future Appointments  Date Time Provider [place]  05/08/2017 10:00 AM Unk Pinto, MD [facility] None  03/05/2018  9:00 AM Penumalli, Earlean Polka, MD [facility] None

## 2017-03-12 ENCOUNTER — Other Ambulatory Visit: Payer: Self-pay | Admitting: Internal Medicine

## 2017-03-12 NOTE — Telephone Encounter (Signed)
Rx called in 

## 2017-04-05 ENCOUNTER — Other Ambulatory Visit: Payer: Self-pay

## 2017-04-05 ENCOUNTER — Encounter (HOSPITAL_COMMUNITY): Payer: Self-pay

## 2017-04-05 DIAGNOSIS — Z79899 Other long term (current) drug therapy: Secondary | ICD-10-CM | POA: Insufficient documentation

## 2017-04-05 DIAGNOSIS — Z85828 Personal history of other malignant neoplasm of skin: Secondary | ICD-10-CM | POA: Insufficient documentation

## 2017-04-05 DIAGNOSIS — Z87891 Personal history of nicotine dependence: Secondary | ICD-10-CM | POA: Diagnosis not present

## 2017-04-05 DIAGNOSIS — R51 Headache: Secondary | ICD-10-CM | POA: Insufficient documentation

## 2017-04-05 DIAGNOSIS — I10 Essential (primary) hypertension: Secondary | ICD-10-CM | POA: Insufficient documentation

## 2017-04-05 NOTE — ED Notes (Signed)
Pt stated that she is nauseous now and has been nauseous all day.

## 2017-04-05 NOTE — ED Triage Notes (Signed)
Pt endorses migraine that began last night with light sensitivity and n/v. Has hx of migraines and this is the usual presentation. VSS. No neuro deficits.

## 2017-04-06 ENCOUNTER — Emergency Department (HOSPITAL_COMMUNITY)
Admission: EM | Admit: 2017-04-06 | Discharge: 2017-04-06 | Disposition: A | Discharge status: Home / Self Care | Payer: Medicare Other | Attending: Emergency Medicine | Admitting: Emergency Medicine

## 2017-04-06 DIAGNOSIS — R519 Headache, unspecified: Secondary | ICD-10-CM

## 2017-04-06 DIAGNOSIS — R51 Headache: Secondary | ICD-10-CM

## 2017-04-06 MED ORDER — METOCLOPRAMIDE HCL 10 MG PO TABS: 10.0000 mg | ORAL_TABLET | Freq: Four times a day (QID) | ORAL | 0 refills | Status: DC | PRN

## 2017-04-06 MED ORDER — DIPHENHYDRAMINE HCL 50 MG/ML IJ SOLN
25.0000 mg | Freq: Once | INTRAMUSCULAR | Status: AC
  Administered 2017-04-06: 25 mg via INTRAVENOUS
  Filled 2017-04-06: qty 1

## 2017-04-06 MED ORDER — DEXAMETHASONE SODIUM PHOSPHATE 10 MG/ML IJ SOLN
10.0000 mg | Freq: Once | INTRAMUSCULAR | Status: AC
  Administered 2017-04-06: 10 mg via INTRAVENOUS
  Filled 2017-04-06: qty 1

## 2017-04-06 MED ORDER — METOCLOPRAMIDE HCL 5 MG/ML IJ SOLN
10.0000 mg | Freq: Once | INTRAMUSCULAR | Status: AC
  Administered 2017-04-06: 10 mg via INTRAVENOUS
  Filled 2017-04-06: qty 2

## 2017-04-06 MED ORDER — SODIUM CHLORIDE 0.9 % IV BOLUS (SEPSIS)
1000.0000 mL | Freq: Once | INTRAVENOUS | Status: AC
  Administered 2017-04-06: 1000 mL via INTRAVENOUS

## 2017-04-06 NOTE — ED Provider Notes (Signed)
[facility] EMERGENCY DEPARTMENT Provider Note   CSN: [id number] Arrival date & time: 04/05/17  2021     History   Chief Complaint Chief Complaint  Patient presents with  . Migraine    HPI Deborah [name] is a 71 y.o. female.  The history is provided by the patient.  Migraine   She has a history of migraines, hyperlipidemia, hypertension, prediabetes, restless leg syndrome.  She had onset last night of a bifrontal headache which is typical of her migraines.  She is unable to characterize the pain, but she rates it at 7/10.  She denies photophobia but does endorse phonophobia.  She denies visual change, nausea, vomiting.  She is feeling generally weak but denies any focal weakness or numbness.  She took 2 doses of sumatriptan with partial relief.  She has had similar headaches in the past which have required visits to the emergency department.  Past Medical History:  Diagnosis Date  . Allergy   . Anemia   . Anxiety   . Arthritis   . Depression   . Hyperlipidemia   . Hypertension   . Migraines   . Prediabetes   . RLS (restless legs syndrome)   . Vitamin D deficiency     Patient Active Problem List   Diagnosis Date Noted  . Aortic atherosclerosis ([place]) 02/03/2017  . TIA (transient ischemic attack) 12/15/2016  . BMI 26.0-26.9,adult 03/12/2015  . Skin cancer 08/06/2014  . Migraine 08/06/2014  . Generalized anxiety disorder 08/06/2014  . Incontinence in female 08/06/2014  . Prediabetes 07/01/2013  . Medication management 07/01/2013  . Hypertension   . Hyperlipidemia   . Vitamin D deficiency     Past Surgical History:  Procedure Laterality Date  . KNEE ARTHROSCOPY Bilateral 04/2014   [facility]  . MOHS SURGERY Right 04/2013   Squamous cell cacinoma  . SPINE SURGERY  2010   C6-C7 fusion    OB History    No data available       Home Medications    Prior to Admission medications   Medication Sig Start Date End Date Taking?  Authorizing Provider  ALPRAZolam (XANAX XR) 2 MG 24 hr tablet TAKE 1 TABLET BY MOUTH IN THE MORNING FOR ANXIETY ATTACKS 03/12/17   Vicie Mutters, PA-C  B Complex Vitamins (VITAMIN-B COMPLEX PO) Take 1 capsule by mouth daily.    [provider]  BIOTIN PO Take by mouth. Patient takes 10000 mcg plus Keratin 1 daily    [provider]  Cholecalciferol (VITAMIN D PO) Take 5,000 Units by mouth daily.    [provider]  divalproex (DEPAKOTE) 500 MG DR tablet Take 1 tablet (500 mg total) at bedtime by mouth. 02/27/17   Penumalli, Earlean Polka, MD  ezetimibe (ZETIA) 10 MG tablet Take 1 tablet daily for Cholesterol 02/05/17 08/05/17  Vicie Mutters, PA-C  famotidine (PEPCID) 40 MG tablet Take 40 mg by mouth. 11/20/16   [provider]  fluticasone (FLONASE) 50 MCG/ACT nasal spray Place 2 sprays into both nostrils at bedtime. Patient taking differently: Place 2 sprays into both nostrils daily as needed.  09/17/15   Vicie Mutters, PA-C  levocetirizine (XYZAL) 5 MG tablet Take 1 tablet (5 mg total) every evening by mouth. 02/28/17   Vicie Mutters, PA-C  pantoprazole (PROTONIX) 40 MG tablet Take 1 tablet daily for heart burn, Indigestion and Nausea 12/27/16 07/27/17  Unk Pinto, MD  prochlorperazine (COMPAZINE) 10 MG tablet Take 1 tablet 3 to 4 x /  day for severe Nausea 12/27/16   Unk Pinto, MD  rosuvastatin (CRESTOR) 20 MG tablet Take 1 tablet (20 mg total) by mouth daily. 11/09/16   Unk Pinto, MD  SUMAtriptan (IMITREX) 20 MG/ACT nasal spray USE ONE DOSE IN THE NOSE NOW, MAY REPEAT IN 2 HOURS. MAX OF 2 DOSES IN 24 HOURS. 02/05/17   Vicie Mutters, PA-C  UNABLE TO FIND Outpatient physical therapy  Diagnosis: cerebral amyloid angiopathy.gait instability 12/16/16   Rai, Vernelle Emerald, MD    Family History Family History  Problem Relation Age of Onset  . Diabetes Mother   . Heart disease Mother   . Heart disease Father   . Stroke Father   . Hypertension  Father   . Hyperlipidemia Sister   . Hypertension Brother     Social History Social History   Tobacco Use  . Smoking status: Former Smoker    Packs/day: 1.00    Years: 4.00    Pack years: 4.00    Types: Cigarettes    Last attempt to quit: 03/25/1986    Years since quitting: 31.0  . Smokeless tobacco: Never Used  Substance Use Topics  . Alcohol use: Yes    Comment: occasional  . Drug use: No     Allergies   Banana; Effexor [venlafaxine]; Maxalt [rizatriptan benzoate]; Tussin [guaifenesin]; and Zostavax [zoster vaccine live]   Review of Systems Review of Systems  All other systems reviewed and are negative.    Physical Exam Updated Vital Signs BP (!) 155/92 (BP Location: Right Arm) Comment: Simultaneous filing. User may not have seen previous data.  Pulse 95 Comment: Simultaneous filing. User may not have seen previous data.  Temp 98.3 F (36.8 C) (Oral)   Resp 18   Ht 5' 6.5" (1.689 m)   Wt 70.3 kg (155 lb)   SpO2 100% Comment: Simultaneous filing. User may not have seen previous data.  BMI 24.64 kg/m   Physical Exam  Nursing note and vitals reviewed.  71 year old female, resting comfortably and in no acute distress. Vital signs are significant for hypertension. Oxygen saturation is 95%, which is normal. Head is normocephalic and atraumatic. PERRLA, EOMI. Oropharynx is clear. Neck is nontender and supple without adenopathy or JVD. Back is nontender and there is no CVA tenderness. Lungs are clear without rales, wheezes, or rhonchi. Chest is nontender. Heart has regular rate and rhythm without murmur. Abdomen is soft, flat, nontender without masses or hepatosplenomegaly and peristalsis is normoactive. Extremities have no cyanosis or edema, full range of motion is present. Skin is warm and dry without rash. Neurologic: Mental status is normal, cranial nerves are intact, there are no motor or sensory deficits.  ED Treatments / Results   Procedures Procedures  (including critical care time)  Medications Ordered in ED Medications  sodium chloride 0.9 % bolus 1,000 mL (not administered)  metoCLOPramide (REGLAN) injection 10 mg (not administered)  diphenhydrAMINE (BENADRYL) injection 25 mg (not administered)  dexamethasone (DECADRON) injection 10 mg (not administered)     Initial Impression / Assessment and Plan / ED Course  I have reviewed the triage vital signs and the nursing notes.  Headache which has some components suggestive of migraine.  Old records are reviewed, and she does have a prior ED visit with similar complaint.  No red flags to suggest more serious causes of headaches.  She is given a dose of metoclopramide and diphenhydramine and dexamethasone as well as IV fluids.  She had excellent relief of headache with above-noted  treatment.  She is discharged with prescription for metoclopramide.  Follow-up with PCP.  Final Clinical Impressions(s) / ED Diagnoses   Final diagnoses:  Acute nonintractable headache, unspecified headache type    ED Discharge Orders        Ordered    metoCLOPramide (REGLAN) 10 MG tablet  Every 6 hours PRN     37/85/88 5027       Delora Fuel, MD 74/12/87 0300

## 2017-04-11 ENCOUNTER — Telehealth: Payer: Self-pay | Admitting: *Deleted

## 2017-04-11 NOTE — Telephone Encounter (Signed)
Patient called and asked if she should continue Depakote.  Per Dr Melford Aase, the [facility] was given to prevent seizures and she should continue it.  Also, she was give Metoclopramide in the ED on 04/06/2017 and was unsure how to take the medication. She was advised to take the Rx every 6 hours as needed for nausea.

## 2017-04-14 ENCOUNTER — Telehealth: Payer: Medicare Other | Admitting: Family

## 2017-04-14 ENCOUNTER — Encounter (HOSPITAL_COMMUNITY): Payer: Self-pay | Admitting: *Deleted

## 2017-04-14 ENCOUNTER — Other Ambulatory Visit: Payer: Self-pay

## 2017-04-14 ENCOUNTER — Emergency Department (HOSPITAL_COMMUNITY)
Admission: EM | Admit: 2017-04-14 | Discharge: 2017-04-15 | Disposition: A | Discharge status: Home / Self Care | Payer: Medicare Other | Attending: Emergency Medicine | Admitting: Emergency Medicine

## 2017-04-14 DIAGNOSIS — R630 Anorexia: Secondary | ICD-10-CM | POA: Diagnosis not present

## 2017-04-14 DIAGNOSIS — Z8673 Personal history of transient ischemic attack (TIA), and cerebral infarction without residual deficits: Secondary | ICD-10-CM | POA: Diagnosis not present

## 2017-04-14 DIAGNOSIS — Z85828 Personal history of other malignant neoplasm of skin: Secondary | ICD-10-CM | POA: Insufficient documentation

## 2017-04-14 DIAGNOSIS — R112 Nausea with vomiting, unspecified: Secondary | ICD-10-CM | POA: Diagnosis not present

## 2017-04-14 DIAGNOSIS — G43809 Other migraine, not intractable, without status migrainosus: Secondary | ICD-10-CM | POA: Diagnosis not present

## 2017-04-14 DIAGNOSIS — Z79899 Other long term (current) drug therapy: Secondary | ICD-10-CM | POA: Diagnosis not present

## 2017-04-14 DIAGNOSIS — Z87891 Personal history of nicotine dependence: Secondary | ICD-10-CM | POA: Insufficient documentation

## 2017-04-14 DIAGNOSIS — R110 Nausea: Secondary | ICD-10-CM

## 2017-04-14 DIAGNOSIS — I10 Essential (primary) hypertension: Secondary | ICD-10-CM | POA: Insufficient documentation

## 2017-04-14 DIAGNOSIS — R51 Headache: Secondary | ICD-10-CM | POA: Diagnosis present

## 2017-04-14 MED ORDER — PROMETHAZINE HCL 25 MG PO TABS: 25.0000 mg | ORAL_TABLET | Freq: Three times a day (TID) | ORAL | 0 refills | Status: DC | PRN

## 2017-04-14 NOTE — Progress Notes (Signed)
We are sorry that you are not feeling well. Here is how we plan to help!  Based on what you have shared with me it looks like you have a Virus that is irritating your GI tract.  Vomiting is the forceful emptying of a portion of the stomach's content through the mouth.  Although nausea and vomiting can make you feel miserable, it's important to remember that these are not diseases, but rather symptoms of an underlying illness.  When we treat short term symptoms, we always caution that any symptoms that persist should be fully evaluated in a medical office.  I have prescribed a medication that will help alleviate your symptoms and allow you to stay hydrated:  Promethazine 25 mg take 1 tablet twice daily  HOME CARE:  Drink clear liquids.  This is very important! Dehydration (the lack of fluid) can lead to a serious complication.  Start off with 1 tablespoon every 5 minutes for 8 hours.  You may begin eating bland foods after 8 hours without vomiting.  Start with saltine crackers, white bread, rice, mashed potatoes, applesauce.  After 48 hours on a bland diet, you may resume a normal diet.  Try to go to sleep.  Sleep often empties the stomach and relieves the need to vomit.  GET HELP RIGHT AWAY IF:   Your symptoms do not improve or worsen within 2 days after treatment.  You have a fever for over 3 days.  You cannot keep down fluids after trying the medication.  MAKE SURE YOU:   Understand these instructions.  Will watch your condition.  Will get help right away if you are not doing well or get worse.   Thank you for choosing an e-visit. Your e-visit answers were reviewed by a board certified advanced clinical practitioner to complete your personal care plan. Depending upon the condition, your plan could have included both over the counter or prescription medications. Please review your pharmacy choice. Be sure that the pharmacy you have chosen is open so that you can pick up your  prescription now.  If there is a problem you may message your provider in MyChart to have the prescription routed to another pharmacy. Your safety is important to us. If you have drug allergies check your prescription carefully.  For the next 24 hours, you can use MyChart to ask questions about today's visit, request a non-urgent call back, or ask for a work or school excuse from your e-visit provider. You will get an e-mail in the next two days asking about your experience. I hope that your e-visit has been valuable and will speed your recovery.   

## 2017-04-14 NOTE — ED Triage Notes (Signed)
To ED for eval of HA and nausea. States she has a hx of migranes and they are just getting worse. Seen in ED on 12/22 for same - given IV meds with relief for 3 or 4 days then pain returned. Pt given meds to take while at home but not working. Pt unable to keep food/drink down.

## 2017-04-15 MED ORDER — DIPHENHYDRAMINE HCL 50 MG/ML IJ SOLN
25.0000 mg | Freq: Once | INTRAMUSCULAR | Status: AC
  Administered 2017-04-15: 25 mg via INTRAVENOUS
  Filled 2017-04-15: qty 1

## 2017-04-15 MED ORDER — METOCLOPRAMIDE HCL 5 MG/ML IJ SOLN
10.0000 mg | Freq: Once | INTRAMUSCULAR | Status: AC
  Administered 2017-04-15: 10 mg via INTRAVENOUS
  Filled 2017-04-15: qty 2

## 2017-04-15 MED ORDER — SODIUM CHLORIDE 0.9 % IV BOLUS (SEPSIS)
1000.0000 mL | Freq: Once | INTRAVENOUS | Status: AC
  Administered 2017-04-15: 1000 mL via INTRAVENOUS

## 2017-04-15 MED ORDER — DEXAMETHASONE SODIUM PHOSPHATE 10 MG/ML IJ SOLN
10.0000 mg | Freq: Once | INTRAMUSCULAR | Status: AC
  Administered 2017-04-15: 10 mg via INTRAVENOUS
  Filled 2017-04-15: qty 1

## 2017-04-15 MED ORDER — KETOROLAC TROMETHAMINE 30 MG/ML IJ SOLN
30.0000 mg | Freq: Once | INTRAMUSCULAR | Status: AC
  Administered 2017-04-15: 30 mg via INTRAVENOUS
  Filled 2017-04-15: qty 1

## 2017-04-15 NOTE — ED Notes (Signed)
Patient Alert and oriented X4. Stable and ambulatory. Patient verbalized understanding of the discharge instructions.  Patient belongings were taken by the patient.  

## 2017-04-15 NOTE — Discharge Instructions (Signed)
Continue your medications as previously prescribed, and follow-up with your neurologist if these symptoms persist.

## 2017-04-15 NOTE — ED Provider Notes (Signed)
[facility] EMERGENCY DEPARTMENT Provider Note   CSN: [id number] Arrival date & time: 04/14/17  1813     History   Chief Complaint Chief Complaint  Patient presents with  . Emesis  . Headache    HPI Deborah [name] is a 71 y.o. female.  Patient is a 71 year old female with past medical history of migraine headaches, restless legs, anxiety, and depression.  She presents today for evaluation of recurrent headache.  She was seen here 9 days ago with similar symptoms and received a migraine cocktail which helped.  She returns today stating that her headache has come back and has been present for the past 2 days.  None of her home medications are helping.  She reports associated nausea and vomiting but denies any fevers, chills, neck pain, or visual disturbances.   The history is provided by the patient.  Headache   This is a recurrent problem. The current episode started 2 days ago. The problem occurs constantly. The problem has not changed since onset.The headache is associated with nothing. The pain is located in the frontal region. The quality of the pain is described as throbbing. The pain is moderate. The pain does not radiate. Associated symptoms include anorexia, nausea and vomiting. Pertinent negatives include no fever. Treatments tried: Reglan and sumatriptan nasal spray. The treatment provided no relief.    Past Medical History:  Diagnosis Date  . Allergy   . Anemia   . Anxiety   . Arthritis   . Depression   . Hyperlipidemia   . Hypertension   . Migraines   . Prediabetes   . RLS (restless legs syndrome)   . Vitamin D deficiency     Patient Active Problem List   Diagnosis Date Noted  . Aortic atherosclerosis ([place]) 02/03/2017  . TIA (transient ischemic attack) 12/15/2016  . BMI 26.0-26.9,adult 03/12/2015  . Skin cancer 08/06/2014  . Migraine 08/06/2014  . Generalized anxiety disorder 08/06/2014  . Incontinence in female 08/06/2014  .  Prediabetes 07/01/2013  . Medication management 07/01/2013  . Hypertension   . Hyperlipidemia   . Vitamin D deficiency     Past Surgical History:  Procedure Laterality Date  . KNEE ARTHROSCOPY Bilateral 04/2014   [facility]  . MOHS SURGERY Right 04/2013   Squamous cell cacinoma  . SPINE SURGERY  2010   C6-C7 fusion    OB History    No data available       Home Medications    Prior to Admission medications   Medication Sig Start Date End Date Taking? Authorizing Provider  ALPRAZolam (XANAX XR) 2 MG 24 hr tablet TAKE 1 TABLET BY MOUTH IN THE MORNING FOR ANXIETY ATTACKS 03/12/17   Vicie Mutters, PA-C  B Complex Vitamins (VITAMIN-B COMPLEX PO) Take 1 capsule by mouth daily.    [provider]  BIOTIN PO Take by mouth. Patient takes 10000 mcg plus Keratin 1 daily    [provider]  Cholecalciferol (VITAMIN D PO) Take 5,000 Units by mouth daily.    [provider]  divalproex (DEPAKOTE) 500 MG DR tablet Take 1 tablet (500 mg total) at bedtime by mouth. 02/27/17   Penumalli, Earlean Polka, MD  ezetimibe (ZETIA) 10 MG tablet Take 1 tablet daily for Cholesterol 02/05/17 08/05/17  Vicie Mutters, PA-C  famotidine (PEPCID) 40 MG tablet Take 40 mg by mouth. 11/20/16   [provider]  fluticasone (FLONASE) 50 MCG/ACT nasal spray Place 2 sprays into both nostrils at bedtime.  Patient taking differently: Place 2 sprays into both nostrils daily as needed.  09/17/15   Vicie Mutters, PA-C  levocetirizine (XYZAL) 5 MG tablet Take 1 tablet (5 mg total) every evening by mouth. 02/28/17   Vicie Mutters, PA-C  metoCLOPramide (REGLAN) 10 MG tablet Take 1 tablet (10 mg total) by mouth every 6 (six) hours as needed for nausea (or headache). 45/40/98   Delora Fuel, MD  pantoprazole (PROTONIX) 40 MG tablet Take 1 tablet daily for heart burn, Indigestion and Nausea 12/27/16 07/27/17  Unk Pinto, MD  prochlorperazine (COMPAZINE) 10 MG tablet Take 1 tablet 3 to 4 x  / day for severe Nausea 12/27/16   Unk Pinto, MD  promethazine (PHENERGAN) 25 MG tablet Take 1 tablet (25 mg total) by mouth every 8 (eight) hours as needed for nausea or vomiting. 04/14/17   Dutch Quint B, FNP  rosuvastatin (CRESTOR) 20 MG tablet Take 1 tablet (20 mg total) by mouth daily. 11/09/16   Unk Pinto, MD  SUMAtriptan (IMITREX) 20 MG/ACT nasal spray USE ONE DOSE IN THE NOSE NOW, MAY REPEAT IN 2 HOURS. MAX OF 2 DOSES IN 24 HOURS. 02/05/17   Vicie Mutters, PA-C  UNABLE TO FIND Outpatient physical therapy  Diagnosis: cerebral amyloid angiopathy.gait instability 12/16/16   Rai, Vernelle Emerald, MD    Family History Family History  Problem Relation Age of Onset  . Diabetes Mother   . Heart disease Mother   . Heart disease Father   . Stroke Father   . Hypertension Father   . Hyperlipidemia Sister   . Hypertension Brother     Social History Social History   Tobacco Use  . Smoking status: Former Smoker    Packs/day: 1.00    Years: 4.00    Pack years: 4.00    Types: Cigarettes    Last attempt to quit: 03/25/1986    Years since quitting: 31.0  . Smokeless tobacco: Never Used  Substance Use Topics  . Alcohol use: Yes    Comment: occasional  . Drug use: No     Allergies   Banana; Effexor [venlafaxine]; Maxalt [rizatriptan benzoate]; Tussin [guaifenesin]; and Zostavax [zoster vaccine live]   Review of Systems Review of Systems  Constitutional: Negative for fever.  Gastrointestinal: Positive for anorexia, nausea and vomiting.  Neurological: Positive for headaches.  All other systems reviewed and are negative.    Physical Exam Updated Vital Signs BP (!) 147/88 (BP Location: Right Arm)   Pulse 89   Temp 98.4 F (36.9 C) (Oral)   Resp 18   SpO2 98%   Physical Exam  Constitutional: She is oriented to person, place, and time. She appears well-developed and well-nourished. No distress.  HENT:  Head: Normocephalic and atraumatic.  Eyes: EOM are normal.  Pupils are equal, round, and reactive to light.  Neck: Normal range of motion. Neck supple.  Cardiovascular: Normal rate and regular rhythm. Exam reveals no gallop and no friction rub.  No murmur heard. Pulmonary/Chest: Effort normal and breath sounds normal. No respiratory distress. She has no wheezes.  Abdominal: Soft. Bowel sounds are normal. She exhibits no distension. There is no tenderness.  Musculoskeletal: Normal range of motion.  Neurological: She is alert and oriented to person, place, and time. She has normal strength. She is not disoriented. No cranial nerve deficit. Coordination normal.  Skin: Skin is warm and dry. She is not diaphoretic.  Nursing note and vitals reviewed.    ED Treatments / Results  Labs (all labs ordered are listed,  but only abnormal results are displayed) Labs Reviewed - No data to display  EKG  EKG Interpretation None       Radiology No results found.  Procedures Procedures (including critical care time)  Medications Ordered in ED Medications  sodium chloride 0.9 % bolus 1,000 mL (not administered)  ketorolac (TORADOL) 30 MG/ML injection 30 mg (not administered)  metoCLOPramide (REGLAN) injection 10 mg (not administered)  diphenhydrAMINE (BENADRYL) injection 25 mg (not administered)  dexamethasone (DECADRON) injection 10 mg (not administered)     Initial Impression / Assessment and Plan / ED Course  I have reviewed the triage vital signs and the nursing notes.  Pertinent labs & imaging results that were available during my care of the patient were reviewed by me and considered in my medical decision making (see chart for details).  Patient presents here with complaints of headache and a history of migraines.  Her neurologic exam is nonfocal and she is feeling better after migraine cocktail.  She was requesting to go home.  She will be discharged, to return as needed for any problems.  Final Clinical Impressions(s) / ED Diagnoses    Final diagnoses:  None    ED Discharge Orders    None       Veryl Speak, MD 04/15/17 [phone]

## 2017-04-16 ENCOUNTER — Telehealth: Payer: Self-pay | Admitting: Internal Medicine

## 2017-04-16 ENCOUNTER — Other Ambulatory Visit: Payer: Self-pay | Admitting: Internal Medicine

## 2017-04-16 NOTE — Telephone Encounter (Signed)
patient advised that new rx of divalproex (DEPAKOTE) 500 MG DR tablet  is not working for her migraines had to go to ED. Per Dr Melford Aase, please follow up with Neurology, they are following for this problem and they prescribed medication. Drink pleanty of fluids to prevent dehydration and can  take tylelnol for low grade fever as directed.

## 2017-04-18 ENCOUNTER — Telehealth: Payer: Self-pay | Admitting: Diagnostic Neuroimaging

## 2017-04-18 NOTE — Telephone Encounter (Signed)
Pt said she was at ED 12/21 and 12/29 for migraine, she did use SUMAtriptan (IMITREX) 20 MG/ACT nasal spray as directed but it did not work so she went to ED . She said on 12/29 she was given cocktail which did help some. She did have a fever and was advised by PCP to take aspirin, this was after she was discharged from the hospital. Pt was advised by PCP to call and let Dr Mamie Nick know in case he thought any medications needed to be changed. Pt said she now has sinus infection. Pt is advised Dr Mamie Nick is not in the office this week. This can wait until his return.

## 2017-04-24 NOTE — Telephone Encounter (Signed)
I called pt and she is doing great right now.  She had a time back starting in thanksgiving of migraine and then had ED visit 3 more times after that.  She did not need anything right now but if starts to have problems again to call us back and she verbalized understanding and was aggreeable.

## 2017-05-01 ENCOUNTER — Other Ambulatory Visit: Payer: Self-pay | Admitting: Physician Assistant

## 2017-05-08 ENCOUNTER — Encounter: Payer: Self-pay | Admitting: Internal Medicine

## 2017-05-08 ENCOUNTER — Ambulatory Visit: Payer: Medicare Other | Admitting: Internal Medicine

## 2017-05-08 VITALS — BP 118/76 | HR 80 | Temp 97.3°F | Resp 16 | Ht 66.5 in | Wt 159.8 lb

## 2017-05-08 DIAGNOSIS — Z1211 Encounter for screening for malignant neoplasm of colon: Secondary | ICD-10-CM

## 2017-05-08 DIAGNOSIS — Z1212 Encounter for screening for malignant neoplasm of rectum: Secondary | ICD-10-CM

## 2017-05-08 DIAGNOSIS — I10 Essential (primary) hypertension: Secondary | ICD-10-CM

## 2017-05-08 DIAGNOSIS — E782 Mixed hyperlipidemia: Secondary | ICD-10-CM

## 2017-05-08 DIAGNOSIS — K219 Gastro-esophageal reflux disease without esophagitis: Secondary | ICD-10-CM

## 2017-05-08 DIAGNOSIS — R7309 Other abnormal glucose: Secondary | ICD-10-CM

## 2017-05-08 DIAGNOSIS — E559 Vitamin D deficiency, unspecified: Secondary | ICD-10-CM

## 2017-05-08 DIAGNOSIS — Z0000 Encounter for general adult medical examination without abnormal findings: Secondary | ICD-10-CM

## 2017-05-08 DIAGNOSIS — Z136 Encounter for screening for cardiovascular disorders: Secondary | ICD-10-CM | POA: Diagnosis not present

## 2017-05-08 DIAGNOSIS — R7303 Prediabetes: Secondary | ICD-10-CM

## 2017-05-08 DIAGNOSIS — I700 Atherosclerosis of aorta: Secondary | ICD-10-CM

## 2017-05-08 DIAGNOSIS — Z23 Encounter for immunization: Secondary | ICD-10-CM | POA: Diagnosis not present

## 2017-05-08 DIAGNOSIS — G43009 Migraine without aura, not intractable, without status migrainosus: Secondary | ICD-10-CM

## 2017-05-08 DIAGNOSIS — Z87891 Personal history of nicotine dependence: Secondary | ICD-10-CM

## 2017-05-08 DIAGNOSIS — R569 Unspecified convulsions: Secondary | ICD-10-CM

## 2017-05-08 DIAGNOSIS — Z79899 Other long term (current) drug therapy: Secondary | ICD-10-CM

## 2017-05-08 DIAGNOSIS — G459 Transient cerebral ischemic attack, unspecified: Secondary | ICD-10-CM

## 2017-05-08 DIAGNOSIS — Z0001 Encounter for general adult medical examination with abnormal findings: Secondary | ICD-10-CM

## 2017-05-08 MED ORDER — ESCITALOPRAM OXALATE 20 MG PO TABS: ORAL_TABLET | ORAL | 2 refills | Status: DC

## 2017-05-08 MED ORDER — ALPRAZOLAM 0.5 MG PO TABS: ORAL_TABLET | ORAL | 0 refills | Status: DC

## 2017-05-08 NOTE — Patient Instructions (Signed)

## 2017-05-08 NOTE — Progress Notes (Signed)
[facility] Unk Pinto, M.D.     [name]. [name], P.A.-C Liane Comber, [place] [address] [place] [address] Telephone [phone] Telefax [phone] Annual Screening/Preventative Visit & Comprehensive Evaluation &  Examination     This very nice 72 y.o. MWF presents for a Screening/Preventative Visit & comprehensive evaluation and management of multiple medical co-morbidities.  Patient has been followed for HTN, Prediabetes, Hyperlipidemia and Vitamin D Deficiency. Patient reports acute & chronic Anxiety and Panic Attacks.      Patient was hospitalized 8/31-12/16/2016 with acute confusion DDx atypical partial complex seizure vs acute confusional migraine who was also found to have Cerebral Amyloid Angiopathy. Topomax was switched to Depakote for concern of sedation and cognitive slowing and also for concern of possible seizure. EEG was negative and neuro psych testing was inconclusive.  Patient is now seeing Dr Leta Baptist for ongoing neuro evaluation of HA's and cognitive dysfunction. Patient reports occasional Migraines usually aborted with Imitrex.      Labile HTN predates since 2010. Patient's BP has been controlled at home and patient denies any cardiac symptoms as chest pain, palpitations, shortness of breath, dizziness or ankle swelling. Today's BP is at goal - 118/76.      Patient's hyperlipidemia is controlled with diet and medications. Patient denies myalgias or other medication SE's. Last lipids were almost to goal: Lab Results  Component Value Date   CHOL 163 02/05/2017   HDL 74 02/05/2017   LDLCALC 102 (H) 12/16/2016   TRIG 95 02/05/2017   CHOLHDL 2.2 02/05/2017      Patient has prediabetes predating (A1c 6.1%/2011) and patient denies reactive hypoglycemic symptoms, visual blurring, diabetic polys, or paresthesias. Last A1c was Normal & at goal: Lab Results  Component Value Date   HGBA1C 5.6 12/16/2016      Finally, patient has history of Vitamin D Deficiency ("18"/2008) and last Vitamin D was at goal: Lab Results  Component Value Date   VD25OH 87 02/05/2017   Current Outpatient Medications on File Prior to Visit  Medication Sig  . ALPRAZolam (XANAX XR) 2 MG 24 hr tablet TAKE 1 TABLET BY MOUTH IN THE MORNING  . B Complex Vitamins (VITAMIN-B COMPLEX PO) Take 1 capsule by mouth daily.  Marland Kitchen BIOTIN PO Take 10,000 mg by mouth daily.   . Cholecalciferol (VITAMIN D PO) Take 5,000 Units by mouth daily.  . divalproex (DEPAKOTE) 500 MG DR tablet Take 1 tablet (500 mg total) at bedtime by mouth.  . ezetimibe (ZETIA) 10 MG tablet Take 1 tablet daily for Cholesterol  . famotidine (PEPCID) 40 MG tablet Take 40 mg by mouth daily.   . fluticasone (FLONASE) 50 MCG/ACT nasal spray Place 2 sprays into both nostrils at bedtime. (Patient taking differently: Place 2 sprays into both nostrils daily as needed for allergies. )  . levocetirizine (XYZAL) 5 MG tablet Take 1 tablet (5 mg total) every evening by mouth.  . metoCLOPramide (REGLAN) 10 MG tablet Take 1 tablet (10 mg total) by mouth every 6 (six) hours as needed for nausea (or headache).  . pantoprazole (PROTONIX) 40 MG tablet Take 1 tablet daily for heart burn, Indigestion and Nausea  . prochlorperazine (COMPAZINE) 10 MG tablet Take 1 tablet 3 to 4 x / day for severe Nausea (Patient taking differently: Take 10 mg by mouth every 6 (six) hours as needed for nausea or vomiting. )  . promethazine (PHENERGAN) 25 MG tablet Take 1 tablet (25 mg total) by  mouth every 8 (eight) hours as needed for nausea or vomiting.  . rosuvastatin (CRESTOR) 20 MG tablet Take 1 tablet (20 mg total) by mouth daily.  . SUMAtriptan (IMITREX) 20 MG/ACT nasal spray USE ONE DOSE IN THE NOSE NOW, MAY REPEAT IN 2 HOURS. MAX OF 2 DOSES IN 24 HOURS.   No current facility-administered medications on file prior to visit.    Allergies  Allergen Reactions  . Banana  Nausea And Vomiting  . Effexor [Venlafaxine] Other (See Comments)    insomnia  . Maxalt [Rizatriptan Benzoate] Nausea Only and Other (See Comments)    Nausea and excessive salivation  . Tussin [Guaifenesin]     rash  . Zostavax [Zoster Vaccine Live] Itching and Rash   Past Medical History:  Diagnosis Date  . Allergy   . Anemia   . Anxiety   . Arthritis   . Depression   . Hyperlipidemia   . Hypertension   . Migraines   . Prediabetes   . RLS (restless legs syndrome)   . Vitamin D deficiency    Health Maintenance  Topic Date Due  . COLONOSCOPY  11/23/2016  . MAMMOGRAM  08/19/2018  . TETANUS/TDAP  12/27/2022  . INFLUENZA VACCINE  Completed  . DEXA SCAN  Completed  . Hepatitis C Screening  Completed  . PNA vac Low Risk Adult  Completed   Immunization History  Administered Date(s) Administered  . Influenza, High Dose Seasonal PF 01/01/2014, 03/15/2015, 12/13/2015, 01/03/2017  . Influenza-Unspecified 12/26/2012  . Pneumococcal Conjugate-13 08/06/2014  . Pneumococcal-Unspecified 12/26/2012  . Td 12/26/2012  . Zoster 12/08/2010   Last Colon - 11/24/2007 and Dr Earlean Shawl recommended Colon in 06/2017.\ EGD & Abd CT in July/Aug 2018 Last MGM - 11/2016   Past Surgical History:  Procedure Laterality Date  . KNEE ARTHROSCOPY Bilateral 04/2014   [facility]  . MOHS SURGERY Right 04/2013   Squamous cell cacinoma  . SPINE SURGERY  2010   C6-C7 fusion   Family History  Problem Relation Age of Onset  . Diabetes Mother   . Heart disease Mother   . Heart disease Father   . Stroke Father   . Hypertension Father   . Hyperlipidemia Sister   . Hypertension Brother    Social History   Tobacco Use  . Smoking status: Former Smoker    Packs/day: 1.00    Years: 4.00    Pack years: 4.00    Types: Cigarettes    Last attempt to quit: 03/25/1986    Years since quitting: 31.1  . Smokeless tobacco: Never Used  Substance Use Topics  . Alcohol use: Yes    Comment: occasional  .  Drug use: No    ROS Constitutional: Denies fever, chills, weight loss/gain, headaches, insomnia,  night sweats, and change in appetite. Does c/o fatigue. Eyes: Denies redness, blurred vision, diplopia, discharge, itchy, watery eyes.  ENT: Denies discharge, congestion, post nasal drip, epistaxis, sore throat, earache, hearing loss, dental pain, Tinnitus, Vertigo, Sinus pain, snoring.  Cardio: Denies chest pain, palpitations, irregular heartbeat, syncope, dyspnea, diaphoresis, orthopnea, PND, claudication, edema Respiratory: denies cough, dyspnea, DOE, pleurisy, hoarseness, laryngitis, wheezing.  Gastrointestinal: Denies dysphagia, heartburn, reflux, water brash, pain, cramps, nausea, vomiting, bloating, diarrhea, constipation, hematemesis, melena, hematochezia, jaundice, hemorrhoids Genitourinary: Denies dysuria, frequency, urgency, nocturia, hesitancy, discharge, hematuria, flank pain Breast: Breast lumps, nipple discharge, bleeding.  Musculoskeletal: Denies arthralgia, myalgia, stiffness, Jt. Swelling, pain, limp, and strain/sprain. Denies falls. Skin: Denies puritis, rash, hives, warts, acne, eczema, changing in skin  lesion Neuro: No weakness, tremor, incoordination, spasms, paresthesia, pain Psychiatric: Denies confusion, memory loss, sensory loss. Denies Depression. Endocrine: Denies change in weight, skin, hair change, nocturia, and paresthesia, diabetic polys, visual blurring, hyper / hypo glycemic episodes.  Heme/Lymph: No excessive bleeding, bruising, enlarged lymph nodes.  Physical Exam  BP 118/76   Pulse 80   Temp (!) 97.3 F (36.3 C)   Resp 16   Ht 5' 6.5" (1.689 m)   Wt 159 lb 12.8 oz (72.5 kg)   BMI 25.41 kg/m   General Appearance: Well nourished, well groomed and in no apparent distress.  Eyes: PERRLA, EOMs, conjunctiva no swelling or erythema, normal fundi and vessels. Sinuses: No frontal/maxillary tenderness ENT/Mouth: EACs patent / TMs  nl. Nares clear without  erythema, swelling, mucoid exudates. Oral hygiene is good. No erythema, swelling, or exudate. Tongue normal, non-obstructing. Tonsils not swollen or erythematous. Hearing normal.  Neck: Supple, thyroid normal. No bruits, nodes or JVD. Respiratory: Respiratory effort normal.  BS equal and clear bilateral without rales, rhonci, wheezing or stridor. Cardio: Heart sounds are normal with regular rate and rhythm and no murmurs, rubs or gallops. Peripheral pulses are normal and equal bilaterally without edema. No aortic or femoral bruits. Chest: symmetric with normal excursions and percussion. Breasts: Symmetric, without lumps, nipple discharge, retractions, or fibrocystic changes.  Abdomen: Flat, soft with bowel sounds active. Nontender, no guarding, rebound, hernias, masses, or organomegaly.  Lymphatics: Non tender without lymphadenopathy.  Musculoskeletal: Full ROM all peripheral extremities, joint stability, 5/5 strength, and normal gait. Skin: Warm and dry without rashes, lesions, cyanosis, clubbing or  ecchymosis.  Neuro: Cranial nerves intact, reflexes equal bilaterally. Normal muscle tone, no cerebellar symptoms. Sensation intact.  Pysch: Alert and oriented X 3, normal affect, Insight and Judgment appropriate.   Assessment and Plan  1. Annual Preventative Screening Examination  2. Essential hypertension  - EKG 12-Lead - Urinalysis, Routine w reflex microscopic - Microalbumin / creatinine urine ratio - CBC with Differential/Platelet - BASIC METABOLIC PANEL WITH GFR - Magnesium - TSH  3. Hyperlipidemia, mixed  - EKG 12-Lead - Hepatic function panel - Lipid panel - TSH  4. Prediabetes  - EKG 12-Lead - Hemoglobin A1c - Insulin, random  5. Vitamin D deficiency  - VITAMIN D 25 Hydroxy  6. Abnormal glucose  - Hemoglobin A1c - Insulin, random  7. TIA (transient ischemic attack)   8. Seizure ([place])  - Valproic acid level  9. Migraine without aura and without status  migrainosus   10. Gastroesophageal reflux disease  - CBC with Differential/Platelet  11. Screening for ischemic heart disease  - EKG 12-Lead  12. Aortic atherosclerosis ([facility])  - EKG 12-Lead  13. Former smoker  - EKG 12-Lead  14. Screening for colorectal cancer  - POC Hemoccult Bld/Stl  15. Medication management  - Urinalysis, Routine w reflex microscopic - Microalbumin / creatinine urine ratio - Valproic acid level  16. Need for prophylactic vaccination with combined diphtheria-tetanus-pertussis (DTP) vaccine  - Tdap vaccine greater than or equal to 7yo IM  17. Chronic Anxiety & hx/o Panic Attacks  - initiate Rx Lexapro 20 mg - to take 1/2 tab daily and switch Alprazolam 2 mg xr to Alprazolam 0.5 mg #60 - take 1/2 - 1 tab 2-3 x/ day prn rescue only  - ROV 1 mo to re-evaluate       Patient was counseled in prudent diet to achieve/maintain BMI less than 25 for weight control, BP monitoring, regular exercise and medications. Discussed med's  effects and SE's. Screening labs and tests as requested with regular follow-up as recommended. Over 40 minutes of exam, counseling, chart review and high complex critical decision making was performed.

## 2017-05-09 ENCOUNTER — Other Ambulatory Visit: Payer: Self-pay | Admitting: Internal Medicine

## 2017-05-09 DIAGNOSIS — Z79899 Other long term (current) drug therapy: Secondary | ICD-10-CM

## 2017-05-09 DIAGNOSIS — R569 Unspecified convulsions: Secondary | ICD-10-CM

## 2017-05-09 LAB — CBC WITH DIFFERENTIAL/PLATELET
BASOS ABS: 73 {cells}/uL (ref 0–200)
Basophils Relative: 1.4 %
EOS PCT: 3.3 %
Eosinophils Absolute: 172 cells/uL (ref 15–500)
HEMATOCRIT: 35.4 % (ref 35.0–45.0)
Hemoglobin: 12.1 g/dL (ref 11.7–15.5)
Lymphs Abs: 1934 cells/uL (ref 850–3900)
MCH: 30.4 pg (ref 27.0–33.0)
MCHC: 34.2 g/dL (ref 32.0–36.0)
MCV: 88.9 fL (ref 80.0–100.0)
MONOS PCT: 12 %
MPV: 10 fL (ref 7.5–12.5)
Neutro Abs: 2397 cells/uL (ref 1500–7800)
Neutrophils Relative %: 46.1 %
Platelets: 272 10*3/uL (ref 140–400)
RBC: 3.98 10*6/uL (ref 3.80–5.10)
RDW: 12.3 % (ref 11.0–15.0)
Total Lymphocyte: 37.2 %
WBC mixed population: 624 cells/uL (ref 200–950)
WBC: 5.2 10*3/uL (ref 3.8–10.8)

## 2017-05-09 LAB — URINALYSIS, ROUTINE W REFLEX MICROSCOPIC
Bilirubin Urine: NEGATIVE
Glucose, UA: NEGATIVE
HGB URINE DIPSTICK: NEGATIVE
Ketones, ur: NEGATIVE
LEUKOCYTES UA: NEGATIVE
NITRITE: NEGATIVE
Protein, ur: NEGATIVE
Specific Gravity, Urine: 1.013 (ref 1.001–1.03)
pH: 7.5 (ref 5.0–8.0)

## 2017-05-09 LAB — HEPATIC FUNCTION PANEL
AG Ratio: 1.7 (calc) (ref 1.0–2.5)
ALKALINE PHOSPHATASE (APISO): 60 U/L (ref 33–130)
ALT: 12 U/L (ref 6–29)
AST: 17 U/L (ref 10–35)
Albumin: 4.1 g/dL (ref 3.6–5.1)
BILIRUBIN DIRECT: 0.1 mg/dL (ref 0.0–0.2)
BILIRUBIN INDIRECT: 0.3 mg/dL (ref 0.2–1.2)
BILIRUBIN TOTAL: 0.4 mg/dL (ref 0.2–1.2)
Globulin: 2.4 g/dL (calc) (ref 1.9–3.7)
Total Protein: 6.5 g/dL (ref 6.1–8.1)

## 2017-05-09 LAB — MICROALBUMIN / CREATININE URINE RATIO
CREATININE, URINE: 94 mg/dL (ref 20–275)
MICROALB UR: 0.8 mg/dL
MICROALB/CREAT RATIO: 9 ug/mg{creat} (ref <30)

## 2017-05-09 LAB — BASIC METABOLIC PANEL WITH GFR
BUN: 10 mg/dL (ref 7–25)
CO2: 28 mmol/L (ref 20–32)
Calcium: 9.5 mg/dL (ref 8.6–10.4)
Chloride: 98 mmol/L (ref 98–110)
Creat: 0.87 mg/dL (ref 0.60–0.93)
GFR, EST NON AFRICAN AMERICAN: 67 mL/min/{1.73_m2} (ref >=60)
GFR, Est African American: 78 mL/min/{1.73_m2} (ref >=60)
GLUCOSE: 112 mg/dL — AB (ref 65–99)
POTASSIUM: 4.5 mmol/L (ref 3.5–5.3)
SODIUM: 135 mmol/L (ref 135–146)

## 2017-05-09 LAB — LIPID PANEL
Cholesterol: 160 mg/dL (ref <200)
HDL: 66 mg/dL (ref >50)
LDL Cholesterol (Calc): 75 mg/dL (calc)
NON-HDL CHOLESTEROL (CALC): 94 mg/dL (ref <130)
Total CHOL/HDL Ratio: 2.4 (calc) (ref <5.0)
Triglycerides: 107 mg/dL (ref <150)

## 2017-05-09 LAB — HEMOGLOBIN A1C
EAG (MMOL/L): 6 (calc)
Hgb A1c MFr Bld: 5.4 % of total Hgb (ref <5.7)
Mean Plasma Glucose: 108 (calc)

## 2017-05-09 LAB — VALPROIC ACID LEVEL: Valproic Acid Lvl: 108.7 mg/L — ABNORMAL HIGH (ref 50.0–100.0)

## 2017-05-09 LAB — TSH: TSH: 3.84 m[IU]/L (ref 0.40–4.50)

## 2017-05-09 LAB — MAGNESIUM: Magnesium: 2.3 mg/dL (ref 1.5–2.5)

## 2017-05-09 LAB — INSULIN, RANDOM: Insulin: 22.3 u[IU]/mL — ABNORMAL HIGH (ref 2.0–19.6)

## 2017-05-09 LAB — VITAMIN D 25 HYDROXY (VIT D DEFICIENCY, FRACTURES): Vit D, 25-Hydroxy: 92 ng/mL (ref 30–100)

## 2017-05-09 MED ORDER — DIVALPROEX SODIUM 250 MG PO DR TAB: DELAYED_RELEASE_TABLET | ORAL | 1 refills | Status: DC

## 2017-05-15 ENCOUNTER — Other Ambulatory Visit: Payer: Self-pay | Admitting: Internal Medicine

## 2017-05-21 ENCOUNTER — Ambulatory Visit: Payer: Self-pay

## 2017-05-22 ENCOUNTER — Other Ambulatory Visit: Payer: Self-pay | Admitting: Internal Medicine

## 2017-05-22 DIAGNOSIS — Z79899 Other long term (current) drug therapy: Secondary | ICD-10-CM

## 2017-05-22 DIAGNOSIS — G40909 Epilepsy, unspecified, not intractable, without status epilepticus: Secondary | ICD-10-CM

## 2017-05-25 ENCOUNTER — Ambulatory Visit: Payer: Medicare Other

## 2017-05-25 DIAGNOSIS — Z79899 Other long term (current) drug therapy: Secondary | ICD-10-CM | POA: Diagnosis not present

## 2017-05-25 DIAGNOSIS — G40909 Epilepsy, unspecified, not intractable, without status epilepticus: Secondary | ICD-10-CM

## 2017-05-25 NOTE — Progress Notes (Signed)
Patient presents to the office for a nurse visit to have labs drawn to check Depakote levels.

## 2017-05-26 LAB — VALPROIC ACID LEVEL: Valproic Acid Lvl: 52.9 mg/L (ref 50.0–100.0)

## 2017-05-29 ENCOUNTER — Ambulatory Visit: Payer: Medicare Other | Admitting: Adult Health

## 2017-05-29 ENCOUNTER — Ambulatory Visit (HOSPITAL_COMMUNITY)
Admission: RE | Admit: 2017-05-29 | Discharge: 2017-05-29 | Disposition: A | Discharge status: Home / Self Care | Payer: Medicare Other | Source: Ambulatory Visit | Attending: Adult Health | Admitting: Adult Health

## 2017-05-29 ENCOUNTER — Ambulatory Visit: Payer: Self-pay

## 2017-05-29 ENCOUNTER — Encounter: Payer: Self-pay | Admitting: Adult Health

## 2017-05-29 ENCOUNTER — Other Ambulatory Visit: Payer: Self-pay | Admitting: Adult Health

## 2017-05-29 VITALS — BP 110/74 | HR 93 | Temp 97.9°F | Ht 66.5 in | Wt 159.0 lb

## 2017-05-29 DIAGNOSIS — W19XXXA Unspecified fall, initial encounter: Secondary | ICD-10-CM

## 2017-05-29 DIAGNOSIS — M4854XA Collapsed vertebra, not elsewhere classified, thoracic region, initial encounter for fracture: Secondary | ICD-10-CM | POA: Insufficient documentation

## 2017-05-29 DIAGNOSIS — M545 Low back pain, unspecified: Secondary | ICD-10-CM

## 2017-05-29 DIAGNOSIS — R82998 Other abnormal findings in urine: Secondary | ICD-10-CM

## 2017-05-29 DIAGNOSIS — R569 Unspecified convulsions: Secondary | ICD-10-CM

## 2017-05-29 DIAGNOSIS — M5136 Other intervertebral disc degeneration, lumbar region: Secondary | ICD-10-CM | POA: Diagnosis not present

## 2017-05-29 DIAGNOSIS — R300 Dysuria: Secondary | ICD-10-CM

## 2017-05-29 DIAGNOSIS — M419 Scoliosis, unspecified: Secondary | ICD-10-CM | POA: Diagnosis not present

## 2017-05-29 MED ORDER — MELOXICAM 15 MG PO TABS: ORAL_TABLET | ORAL | 1 refills | Status: DC

## 2017-05-29 MED ORDER — DIVALPROEX SODIUM 250 MG PO DR TAB: DELAYED_RELEASE_TABLET | ORAL | 1 refills | Status: DC

## 2017-05-29 MED ORDER — METHOCARBAMOL 500 MG PO TABS: 500.0000 mg | ORAL_TABLET | Freq: Three times a day (TID) | ORAL | 0 refills | Status: DC | PRN

## 2017-05-29 MED ORDER — CYCLOBENZAPRINE HCL 10 MG PO TABS: 5.0000 mg | ORAL_TABLET | Freq: Three times a day (TID) | ORAL | 0 refills | Status: DC | PRN

## 2017-05-29 NOTE — Patient Instructions (Signed)
Methocarbamol tablets What is this medicine? METHOCARBAMOL (meth oh KAR ba mole) helps to relieve pain and stiffness in muscles caused by strains, sprains, or other injury to your muscles. This medicine may be used for other purposes; ask your health care provider or pharmacist if you have questions. COMMON BRAND NAME(S): Robaxin What should I tell my health care provider before I take this medicine? They need to know if you have any of these conditions: -kidney disease -seizures -an unusual or allergic reaction to methocarbamol, other medicines, foods, dyes, or preservatives -pregnant or trying to get pregnant -breast-feeding How should I use this medicine? Take this medicine by mouth with a full glass of water. Follow the directions on the prescription label. Take your medicine at regular intervals. Do not take your medicine more often than directed. Talk to your pediatrician regarding the use of this medicine in children. Special care may be needed. Overdosage: If you think you have taken too much of this medicine contact a poison control center or emergency room at once. NOTE: This medicine is only for you. Do not share this medicine with others. What if I miss a dose? If you miss a dose, take it as soon as you can. If it is almost time for your next dose, take only the next dose. Do not take double or extra doses. What may interact with this medicine? Do not take this medication with any of the following medicines: -narcotic medicines for cough This medicine may also interact with the following medications: -alcohol -antihistamines for allergy, cough and cold -certain medicines for anxiety or sleep -certain medicines for depression like amitriptyline, fluoxetine, sertraline -certain medicines for seizures like phenobarbital, primidone -cholinesterase inhibitors like neostigmine, ambenonium, and pyridostigmine bromide -general anesthetics like halothane, isoflurane, methoxyflurane,  propofol -local anesthetics like lidocaine, pramoxine, tetracaine -medicines that relax muscles for surgery -narcotic medicines for pain -phenothiazines like chlorpromazine, mesoridazine, prochlorperazine, thioridazine This list may not describe all possible interactions. Give your health care provider a list of all the medicines, herbs, non-prescription drugs, or dietary supplements you use. Also tell them if you smoke, drink alcohol, or use illegal drugs. Some items may interact with your medicine. What should I watch for while using this medicine? Tell your doctor or health care professional if your symptoms do not start to get better or if they get worse. You may get drowsy or dizzy. Do not drive, use machinery, or do anything that needs mental alertness until you know how this medicine affects you. Do not stand or sit up quickly, especially if you are an older patient. This reduces the risk of dizzy or fainting spells. Alcohol may interfere with the effect of this medicine. Avoid alcoholic drinks. If you are taking another medicine that also causes drowsiness, you may have more side effects. Give your health care provider a list of all medicines you use. Your doctor will tell you how much medicine to take. Do not take more medicine than directed. Call emergency for help if you have problems breathing or unusual sleepiness. What side effects may I notice from receiving this medicine? Side effects that you should report to your doctor or health care professional as soon as possible: -allergic reactions like skin rash, itching or hives, swelling of the face, lips, or tongue -breathing problems -confusion -seizures -unusually weak or tired Side effects that usually do not require medical attention (report to your doctor or health care professional if they continue or are bothersome): -dizziness -headache -metallic taste -tiredness -upset  stomach This list may not describe all possible side  effects. Call your doctor for medical advice about side effects. You may report side effects to FDA at 1-800-FDA-1088. Where should I keep my medicine? Keep out of the reach of children. Store at room temperature between 20 and 25 degrees C (68 and 77 degrees F). Keep container tightly closed. Throw away any unused medicine after the expiration date. NOTE: This sheet is a summary. It may not cover all possible information. If you have questions about this medicine, talk to your doctor, pharmacist, or health care provider.  2018 Elsevier/Gold Standard (2015-01-12 13:11:54)    Meloxicam tablets What is this medicine? MELOXICAM (mel OX i cam) is a non-steroidal anti-inflammatory drug (NSAID). It is used to reduce swelling and to treat pain. It may be used for osteoarthritis, rheumatoid arthritis, or juvenile rheumatoid arthritis. This medicine may be used for other purposes; ask your health care provider or pharmacist if you have questions. COMMON BRAND NAME(S): Mobic What should I tell my health care provider before I take this medicine? They need to know if you have any of these conditions: -bleeding disorders -cigarette smoker -coronary artery bypass graft (CABG) surgery within the past 2 weeks -drink more than 3 alcohol-containing drinks per day -heart disease -high blood pressure -history of stomach bleeding -kidney disease -liver disease -lung or breathing disease, like asthma -stomach or intestine problems -an unusual or allergic reaction to meloxicam, aspirin, other NSAIDs, other medicines, foods, dyes, or preservatives -pregnant or trying to get pregnant -breast-feeding How should I use this medicine? Take this medicine by mouth with a full glass of water. Follow the directions on the prescription label. You can take it with or without food. If it upsets your stomach, take it with food. Take your medicine at regular intervals. Do not take it more often than directed. Do not stop  taking except on your doctor's advice. A special MedGuide will be given to you by the pharmacist with each prescription and refill. Be sure to read this information carefully each time. Talk to your pediatrician regarding the use of this medicine in children. While this drug may be prescribed for selected conditions, precautions do apply. Patients over 92 years old may have a stronger reaction and need a smaller dose. Overdosage: If you think you have taken too much of this medicine contact a poison control center or emergency room at once. NOTE: This medicine is only for you. Do not share this medicine with others. What if I miss a dose? If you miss a dose, take it as soon as you can. If it is almost time for your next dose, take only that dose. Do not take double or extra doses. What may interact with this medicine? Do not take this medicine with any of the following medications: -cidofovir -ketorolac This medicine may also interact with the following medications: -aspirin and aspirin-like medicines -certain medicines for blood pressure, heart disease, irregular heart beat -certain medicines for depression, anxiety, or psychotic disturbances -certain medicines that treat or prevent blood clots like warfarin, enoxaparin, dalteparin, apixaban, dabigatran, rivaroxaban -cyclosporine -digoxin -diuretics -methotrexate -other NSAIDs, medicines for pain and inflammation, like ibuprofen and naproxen -pemetrexed This list may not describe all possible interactions. Give your health care provider a list of all the medicines, herbs, non-prescription drugs, or dietary supplements you use. Also tell them if you smoke, drink alcohol, or use illegal drugs. Some items may interact with your medicine. What should I watch for while using  this medicine? Tell your doctor or healthcare professional if your symptoms do not start to get better or if they get worse. Do not take other medicines that contain  aspirin, ibuprofen, or naproxen with this medicine. Side effects such as stomach upset, nausea, or ulcers may be more likely to occur. Many medicines available without a prescription should not be taken with this medicine. This medicine can cause ulcers and bleeding in the stomach and intestines at any time during treatment. This can happen with no warning and may cause death. There is increased risk with taking this medicine for a long time. Smoking, drinking alcohol, older age, and poor health can also increase risks. Call your doctor right away if you have stomach pain or blood in your vomit or stool. This medicine does not prevent heart attack or stroke. In fact, this medicine may increase the chance of a heart attack or stroke. The chance may increase with longer use of this medicine and in people who have heart disease. If you take aspirin to prevent heart attack or stroke, talk with your doctor or health care professional. What side effects may I notice from receiving this medicine? Side effects that you should report to your doctor or health care professional as soon as possible: -allergic reactions like skin rash, itching or hives, swelling of the face, lips, or tongue -nausea, vomiting -signs and symptoms of a blood clot such as breathing problems; changes in vision; chest pain; severe, sudden headache; pain, swelling, warmth in the leg; trouble speaking; sudden numbness or weakness of the face, arm, or leg -signs and symptoms of bleeding such as bloody or black, tarry stools; red or dark-brown urine; spitting up blood or brown material that looks like coffee grounds; red spots on the skin; unusual bruising or bleeding from the eye, gums, or nose -signs and symptoms of liver injury like dark yellow or brown urine; general ill feeling or flu-like symptoms; light-colored stools; loss of appetite; nausea; right upper belly pain; unusually weak or tired; yellowing of the eyes or skin -signs and  symptoms of stroke like changes in vision; confusion; trouble speaking or understanding; severe headaches; sudden numbness or weakness of the face, arm, or leg; trouble walking; dizziness; loss of balance or coordination Side effects that usually do not require medical attention (report to your doctor or health care professional if they continue or are bothersome): -constipation -diarrhea -gas This list may not describe all possible side effects. Call your doctor for medical advice about side effects. You may report side effects to FDA at 1-800-FDA-1088. Where should I keep my medicine? Keep out of the reach of children. Store at room temperature between 15 and 30 degrees C (59 and 86 degrees F). Throw away any unused medicine after the expiration date. NOTE: This sheet is a summary. It may not cover all possible information. If you have questions about this medicine, talk to your doctor, pharmacist, or health care provider.  2018 Elsevier/Gold Standard (2015-05-05 19:28:16)

## 2017-05-29 NOTE — Progress Notes (Signed)
Assessment and Plan:  Deborah [name] was seen today for urinary concerns and fall.  Diagnoses and all orders for this visit:  Fall, initial encounter Continue follow up with neurology - ? Dementia,  Medications reviewed - no notable high risk medications at this time Recent Depakote titer at therapeutic level -     DG Lumbar Spine Complete; Future  Dark urine/dysuria -     Urinalysis w microscopic + reflex cultur -     CBC with Differential/Platelet -     BASIC METABOLIC PANEL WITH GFR  Acute bilateral low back pain without sciatica -     DG Lumbar Spine Complete; Future -     meloxicam (MOBIC) 15 MG tablet; Take one daily with food for 2 weeks, can take with tylenol, can not take with aleve, iburpofen, then as needed daily for pain -     methocarbamol (ROBAXIN) 500 MG tablet; Take 1 tablet (500 mg total) by mouth every 8 (eight) hours as needed for muscle spasms- discussed with patient and husband regarding SE of possible drowsiness - take very cautiously at night with supervision  Further disposition pending results of labs. Discussed med's effects and SE's.   Over 30 minutes of exam, counseling, chart review, and critical decision making was performed.   Future Appointments  Date Time Provider [place]  06/11/2017  3:30 PM Unk Pinto, MD [facility] None  08/16/2017  9:30 AM Liane Comber, NP [facility] None  11/26/2017  9:30 AM Unk Pinto, MD [facility] None  03/05/2018  9:00 AM Penni Bombard, MD [facility] None  05/29/2018 10:00 AM Unk Pinto, MD [facility] None    ------------------------------------------------------------------------------------------------------------------   HPI BP 110/74   Pulse 93   Temp 97.9 F (36.6 C)   Ht 5' 6.5" (1.689 m)   Wt 159 lb (72.1 kg)   SpO2 95%   BMI 25.28 kg/m   72 y.o.female presents for concerns of very dark urine, lower back/left flank pain ongoing after she experienced a mechanical fall -  tripped in her  garden  and landed on her back/left flank on a low wall surrounding a flower bed 2 days ago. Was not witnessed, patient is a poor historian on providing details, however fenies LOC, head injury. She reports 10/10 generalized lower back pain ongoing since without radiation, numbness/tingling of extremities. This is worse with any movement, patient is pacing in office today. Has not tried any interventions for pain.   She reports her urine has been dark since the fall - reports "has blood in it" - provider viewed urine sample personally today, appears dark gold without gross hematuria. She also endorses new dysuria but denies frequency, urgency, incontinence, nausea/vomiting/diarrhea/hematochezia/melena. She endorse mild intermittent lower abdominal crampiness, 3/10. Not notably associated with eating or other identifiable pattern.   She reports ongoing frequent falls this past year for which she has been evaluated by both the ER and this office, medications have been adjusted. These were further reviewed today; she is currently prescribed xanax 0.5 mg TID PRN panic attacks, reports typical use is once every other day. Rarely uses metoclopramide or prochlorperazine (originally prescribed for severe nausea last year which has mostly resolved). Currently do not identify notable high risk medications for adjustment.  She is being evaluated by neurology for seizures (patient denies every having seizures), memory changes, headaches and these recent falls.   Recent EKG 05/08/2017 reviewed; WNL - + TIA 11/2016 - CT head 12/15/2016 - chronic small vessel white matter ischemic changes  Past Medical  History:  Diagnosis Date  . Allergy   . Anemia   . Anxiety   . Arthritis   . Depression   . Hyperlipidemia   . Hypertension   . Migraines   . Prediabetes   . RLS (restless legs syndrome)   . Vitamin D deficiency      Allergies  Allergen Reactions  . Banana Nausea And Vomiting  . Effexor [Venlafaxine] Other  (See Comments)    insomnia  . Maxalt [Rizatriptan Benzoate] Nausea Only and Other (See Comments)    Nausea and excessive salivation  . Tussin [Guaifenesin]     rash  . Zostavax [Zoster Vaccine Live] Itching and Rash    Current Outpatient Medications on File Prior to Visit  Medication Sig  . ALPRAZolam (XANAX) 0.5 MG tablet Take 1/2 to 1 tablet 2 to 3 x / day to Rescue Panic Attack  . B Complex Vitamins (VITAMIN-B COMPLEX PO) Take 1 capsule by mouth daily.  Marland Kitchen BIOTIN PO Take 10,000 mg by mouth daily.   . Cholecalciferol (VITAMIN D PO) Take 5,000 Units by mouth daily.  . divalproex (DEPAKOTE) 250 MG DR tablet Take 1 tablet 2 x / day or as directed to prevent seizures  . escitalopram (LEXAPRO) 20 MG tablet Take 1/2 to 1 tablet daily Prevention of Chronic Anxiety & Panic Attacks  . ezetimibe (ZETIA) 10 MG tablet Take 1 tablet daily for Cholesterol  . famotidine (PEPCID) 40 MG tablet Take 40 mg by mouth daily.   . fluticasone (FLONASE) 50 MCG/ACT nasal spray Place 2 sprays into both nostrils at bedtime. (Patient taking differently: Place 2 sprays into both nostrils daily as needed for allergies. )  . levocetirizine (XYZAL) 5 MG tablet Take 1 tablet (5 mg total) every evening by mouth.  . metoCLOPramide (REGLAN) 10 MG tablet Take 1 tablet (10 mg total) by mouth every 6 (six) hours as needed for nausea (or headache).  . prochlorperazine (COMPAZINE) 10 MG tablet Take 1 tablet 3 to 4 x / day for severe Nausea (Patient taking differently: Take 10 mg by mouth every 6 (six) hours as needed for nausea or vomiting. )  . rosuvastatin (CRESTOR) 20 MG tablet TAKE 1 TABLET BY MOUTH ONCE DAILY  . SUMAtriptan (IMITREX) 20 MG/ACT nasal spray USE ONE DOSE IN THE NOSE NOW, MAY REPEAT IN 2 HOURS. MAX OF 2 DOSES IN 24 HOURS.   No current facility-administered medications on file prior to visit.     ROS: Review of Systems  Constitutional: Negative for malaise/fatigue and weight loss.  HENT: Negative for hearing  loss and tinnitus.   Eyes: Negative for blurred vision and double vision.  Respiratory: Negative for cough, shortness of breath and wheezing.   Cardiovascular: Negative for chest pain, palpitations, orthopnea, claudication and leg swelling.  Gastrointestinal: Negative for abdominal pain, blood in stool, constipation, diarrhea, heartburn, melena, nausea and vomiting.  Genitourinary: Positive for dysuria, flank pain and hematuria. Negative for frequency and urgency.  Musculoskeletal: Positive for back pain and falls. Negative for joint pain and myalgias.  Skin: Negative for rash.  Neurological: Negative for dizziness, tingling, sensory change, weakness and headaches.  Endo/Heme/Allergies: Negative for polydipsia.  Psychiatric/Behavioral: Negative.   All other systems reviewed and are negative.   Physical Exam:  BP 110/74   Pulse 93   Temp 97.9 F (36.6 C)   Ht 5' 6.5" (1.689 m)   Wt 159 lb (72.1 kg)   SpO2 95%   BMI 25.28 kg/m   General Appearance: Well  nourished, appears in pain, pacing, does not appear acutely ill.  Eyes: PERRLA, EOMs, conjunctiva no swelling or erythema Sinuses: No Frontal/maxillary tenderness ENT/Mouth: Ext aud canals clear, TMs without erythema, bulging. No erythema, swelling, or exudate on post pharynx.  Tonsils not swollen or erythematous. Hearing normal.  Neck: Supple.  Respiratory: Respiratory effort normal, somewhat shallow, BS equal bilaterally without rales, rhonchi, wheezing or stridor.  Cardio: RRR with no MRGs. Brisk peripheral pulses without edema.  Abdomen: Soft, + BS.  Non tender, no guarding, rebound, palpable hernias, masses. Musculoskeletal: Defer lumbar ROM - severe pain, symmetrical strength upper and lower extremities, antalgic gait. No point tenderness of spine/SI joints/over hip.  Skin: Warm, dry with healing abrasion to left flank area, bruise to left lower back/upper hip Neuro: Cranial nerves intact. Normal muscle tone, no cerebellar  symptoms. Sensation intact.  Psych: Awake and oriented X 3, poor focus, ?flight of ideas, judgement questionable, difficulty focusing, gets distracted following instructions    Izora Ribas, NP 2:48 PM [facility]

## 2017-05-30 ENCOUNTER — Other Ambulatory Visit: Payer: Self-pay | Admitting: Adult Health

## 2017-05-30 DIAGNOSIS — S22080A Wedge compression fracture of T11-T12 vertebra, initial encounter for closed fracture: Secondary | ICD-10-CM

## 2017-05-30 LAB — URINALYSIS W MICROSCOPIC + REFLEX CULTURE
Bacteria, UA: NONE SEEN /HPF
Bilirubin Urine: NEGATIVE
Glucose, UA: NEGATIVE
HGB URINE DIPSTICK: NEGATIVE
Leukocyte Esterase: NEGATIVE
Nitrites, Initial: NEGATIVE
PH: 5.5 (ref 5.0–8.0)
Protein, ur: NEGATIVE
RBC / HPF: NONE SEEN /HPF (ref 0–2)
Specific Gravity, Urine: 1.027 (ref 1.001–1.03)

## 2017-05-30 LAB — CBC WITH DIFFERENTIAL/PLATELET
BASOS PCT: 0.9 %
Basophils Absolute: 60 cells/uL (ref 0–200)
EOS ABS: 121 {cells}/uL (ref 15–500)
EOS PCT: 1.8 %
HCT: 37.2 % (ref 35.0–45.0)
Hemoglobin: 12.6 g/dL (ref 11.7–15.5)
Lymphs Abs: 1407 cells/uL (ref 850–3900)
MCH: 30.1 pg (ref 27.0–33.0)
MCHC: 33.9 g/dL (ref 32.0–36.0)
MCV: 89 fL (ref 80.0–100.0)
MONOS PCT: 12.1 %
MPV: 9.9 fL (ref 7.5–12.5)
Neutro Abs: 4301 cells/uL (ref 1500–7800)
Neutrophils Relative %: 64.2 %
PLATELETS: 262 10*3/uL (ref 140–400)
RBC: 4.18 10*6/uL (ref 3.80–5.10)
RDW: 12.4 % (ref 11.0–15.0)
TOTAL LYMPHOCYTE: 21 %
WBC mixed population: 811 cells/uL (ref 200–950)
WBC: 6.7 10*3/uL (ref 3.8–10.8)

## 2017-05-30 LAB — BASIC METABOLIC PANEL WITH GFR
BUN: 18 mg/dL (ref 7–25)
CALCIUM: 10 mg/dL (ref 8.6–10.4)
CHLORIDE: 101 mmol/L (ref 98–110)
CO2: 27 mmol/L (ref 20–32)
CREATININE: 0.84 mg/dL (ref 0.60–0.93)
GFR, Est African American: 81 mL/min/{1.73_m2} (ref >=60)
GFR, Est Non African American: 70 mL/min/{1.73_m2} (ref >=60)
GLUCOSE: 93 mg/dL (ref 65–99)
Potassium: 5.1 mmol/L (ref 3.5–5.3)
Sodium: 138 mmol/L (ref 135–146)

## 2017-05-30 LAB — NO CULTURE INDICATED

## 2017-06-07 DIAGNOSIS — S22080A Wedge compression fracture of T11-T12 vertebra, initial encounter for closed fracture: Secondary | ICD-10-CM

## 2017-06-07 HISTORY — DX: Wedge compression fracture of T11-T12 vertebra, initial encounter for closed fracture: S22.080A

## 2017-06-11 ENCOUNTER — Ambulatory Visit: Payer: Medicare Other | Admitting: Internal Medicine

## 2017-06-11 ENCOUNTER — Encounter: Payer: Self-pay | Admitting: Internal Medicine

## 2017-06-11 VITALS — BP 110/74 | HR 100 | Temp 97.7°F | Ht 66.5 in | Wt 158.0 lb

## 2017-06-11 DIAGNOSIS — S22000D Wedge compression fracture of unspecified thoracic vertebra, subsequent encounter for fracture with routine healing: Secondary | ICD-10-CM

## 2017-06-11 DIAGNOSIS — Z79899 Other long term (current) drug therapy: Secondary | ICD-10-CM

## 2017-06-11 DIAGNOSIS — G40909 Epilepsy, unspecified, not intractable, without status epilepticus: Secondary | ICD-10-CM

## 2017-06-11 MED ORDER — FLUTICASONE PROPIONATE 50 MCG/ACT NA SUSP: 2.0000 | Freq: Every day | NASAL | 3 refills | Status: DC | PRN

## 2017-06-11 NOTE — Patient Instructions (Signed)
Spinal Compression Fracture A spinal compression fracture is a collapse of the bones that form the spine (vertebrae). With this type of fracture, the vertebrae become squashed (compressed) into a wedge shape. Most compression fractures happen in the middle or lower part of the spine. What are the causes? This condition may be caused by:  Thinning and loss of density in the bones (osteoporosis). This is the most common cause.  A fall.  A car or motorcycle accident.  Cancer.  Trauma, such as a heavy, direct hit to the head.  What increases the risk? You may be at greater risk for a spinal compression fracture if you:  Are 31 years old or older.  Have osteoporosis.  Have certain types of cancer, including: ? Multiple myeloma. ? Lymphoma. ? Prostate cancer. ? Lung cancer. ? Breast cancer.  What are the signs or symptoms? Symptoms of this condition include:  Severe pain.  Pain that gets worse over time.  Pain that is worse when you stand, walk, sit, or bend.  Sudden pain that is so bad that it is hard for you to move.  Bending or humping of the spine.  Gradual loss of height.  Numbness, tingling, or weakness in the back and legs.  Trouble walking.  Your symptoms will depend on the cause of the fracture and how quickly it develops. For example, fractures that are caused by osteoporosis can cause few symptoms, no symptoms, or symptoms that develop slowly over time. How is this diagnosed? This condition may be diagnosed based on symptoms, medical history, and a physical exam. During the physical exam, your health care provider may tap along the length of your spine to check for tenderness. Tests may be done to confirm the diagnosis. They may include:  A bone density test to check for osteoporosis.  Imaging tests, such as a spine X-ray, a CT scan, or MRI.  How is this treated? Treatment for this condition depends on the cause and severity of the condition.Some  fractures, such as those that are caused by osteoporosis, may heal on their own with supportive care. This may include:  Pain medicine.  Rest.  A back brace.  Physical therapy exercises.  Medicine that reduces bone pain.  Calcium and vitamin D supplements.  Fractures that cause the back to become misshapen, cause nerve pain or weakness, or do not respond to other treatment may be treated with a surgical procedure, such as:  Vertebroplasty. In this procedure, bone cement is injected into the collapsed vertebrae to stabilize them.  Balloon kyphoplasty. In this procedure, the collapsed vertebrae are expanded with a balloon and then bone cement is injected into them.  Spinal fusion. In this procedure, the collapsed vertebrae are connected (fused) to normal vertebrae.  Follow these instructions at home: General instructions  Take medicines only as directed by your health care provider.  Do not drive or operate heavy machinery while taking pain medicine.  If directed, apply ice to the injured area: ? Put ice in a plastic bag. ? Place a towel between your skin and the bag. ? Leave the ice on for 30 minutes every two hours at first. Then apply the ice as needed.  Wear your neck brace or back brace as directed by your health care provider.  Do not drink alcohol. Alcohol can interfere with your treatment.  Keep all follow-up visits as directed by your health care provider. This is important. It can help to prevent permanent injury, disability, and long-lasting (chronic) pain.  Activity  Stay in bed (on bed rest) only as directed by your health care provider. Being on bed rest for too long can make your condition worse.  Return to your normal activities as directed by your health care provider. Ask what activities are safe for you.  Do exercises to improve motion and strength in your back (physical therapy), as recommended by your health care provider.  Exercise regularly as  directed by your health care provider. Contact a health care provider if:  You have a fever.  You develop a cough that makes your pain worse.  Your pain medicine is not helping.  Your pain does not get better over time.  You cannot return to your normal activities as planned or expected. Get help right away if:  Your pain is very bad and it suddenly gets worse.  You are unable to move any body part (paralysis) that is below the level of your injury.  You have numbness, tingling, or weakness in any body part that is below the level of your injury.  You cannot control your bladder or bowels. This information is not intended to replace advice given to you by your health care provider. Make sure you discuss any questions you have with your health care provider. Document Released: 04/03/2005 Document Revised: 11/30/2015 Document Reviewed: 04/07/2014 Elsevier Interactive Patient Education  [employer].

## 2017-06-11 NOTE — Progress Notes (Signed)
Subjective:    Patient ID: Deborah [name], female    DOB: November 27, 1945, 72 y.o.   MRN: [id number]  HPI  Patient returns for 1 month f/u for her chronic & acute anxiety and Panic attacks as she was started on Lexapro was switched from Alprazolam xr to lower dose Alprazolam to be used only on a rescue basis. At last visit, her Depakote level was elevated at 108 (Nl range 50-100) and her dose was again tapered in consideration of perceived oversedation and she does report feeling mentally cleared with the med changes and dosage reduction.  Likewise, she feels in better control of her anxiety.     In the interim since last OV, she had a fall and X-rays showed a 10% compression of T-12 and she was seen by Dr Arnoldo Morale who did not recommend Kyphoplasty. She reports pains is slowly decreasing  Medication Sig  . ALPRAZolam  0.5 MG tablet Take 1/2 to 1 tablet 2 to 3 x / day to Rescue Panic Attack  . VIT-B COMPLEX  Take 1 capsule by mouth daily.  Marland Kitchen BIOTIN Take 10,000 mg by mouth daily.   Marland Kitchen VITAMIN D Take 5,000 Units by mouth daily.  . cyclobenzaprine  10 MG  Take 0.5 tab  3  times daily as needed  . DEPAKOTE 250 MG  Take 1 tablet daily or as directed for headaches and seizures  . LEXAPRO 20 MG Take 1/2 to 1 tablet daily Prevention of Chronic Anxiety & Panic Attacks  . ezetimibe  10 MG  Take 1 tablet daily for Cholesterol  . famotidine  40 MG  Take 40 mg by mouth daily.   Marland Kitchen levocetirizine  5 MG  Take 1 tablet (5 mg total) every evening by mouth.  . meloxicam  15 MG Take one daily with food   . metoCLOPramide  10 MG  Take 1 tab every 6hours as needed  . prochlorperazine 10 MG Take 1 tablet 3 to 4 x / day for severe Nausea  . rosuvastatin  20 MG TAKE 1 TABLET BY MOUTH ONCE DAILY  . IMITREX 20 MG nasal spray USE ONE DOSE IN THE NOSE NOW, MAY REPEAT IN 2 HOURS.  Marland Kitchen fFLONASE nasal spray Place 2 sprays into both nostrils at bedtime.    Allergies  Allergen Reactions  . Banana Nausea And Vomiting  . Effexor  [Venlafaxine] Other (See Comments)    insomnia  . Maxalt [Rizatriptan Benzoate] Nausea Only and Other (See Comments)    Nausea and excessive salivation  . Tussin [Guaifenesin]     rash  . Zostavax [Zoster Vaccine Live] Itching and Rash   Past Medical History:  Diagnosis Date  . Allergy   . Anemia   . Anxiety   . Arthritis   . Depression   . Hyperlipidemia   . Hypertension   . Migraines   . Prediabetes   . RLS (restless legs syndrome)   . Vitamin D deficiency    Past Surgical History:  Procedure Laterality Date  . KNEE ARTHROSCOPY Bilateral 04/2014   [facility]  . MOHS SURGERY Right 04/2013   Squamous cell cacinoma  . SPINE SURGERY  2010   C6-C7 fusion   Review of Systems  10 point systems review negative except as above..    Objective:   Physical Exam  BP 110/74   Pulse 100   Temp 97.7 F (36.5 C)   Ht 5' 6.5" (1.689 m)   Wt 158 lb (71.7 kg)  SpO2 96%   BMI 25.12 kg/m   HEENT - WNL. Neck - supple.  Chest - Clear equal BS. Cor - Nl HS. RRR w/o sig MGR. PP 1(+). No edema. MS- FROM w/o deformities.  Gait Nl. Neuro -  Nl w/o focal abnormalities.    Assessment & Plan:   1. Closed compression fracture of thoracic vertebra with routine healing, subsequent encounter   2. Seizure disorder (HCC)  - Valproic acid level  3. Medication management  - Valproic acid level   - - - - - - - - - - - - - - - - - - - - - -   - discussed meds /SE's. - has f/u 08/16/2017

## 2017-06-12 LAB — VALPROIC ACID LEVEL: VALPROIC ACID LVL: 50.6 mg/L (ref 50.0–100.0)

## 2017-06-25 ENCOUNTER — Other Ambulatory Visit: Payer: Self-pay | Admitting: Internal Medicine

## 2017-08-02 DIAGNOSIS — K648 Other hemorrhoids: Secondary | ICD-10-CM

## 2017-08-02 HISTORY — PX: COLONOSCOPY W/ POLYPECTOMY: SHX1380

## 2017-08-02 HISTORY — DX: Other hemorrhoids: K64.8

## 2017-08-02 LAB — HM COLONOSCOPY

## 2017-08-14 ENCOUNTER — Encounter: Payer: Self-pay | Admitting: *Deleted

## 2017-08-15 NOTE — Progress Notes (Signed)
FOLLOW UP  Assessment and Plan:   Hypertension Controlled off of medications Monitor blood pressure at home; patient to call if consistently greater than 130/80 Continue DASH diet.   Reminder to go to the ER if any CP, SOB, nausea, dizziness, severe HA, changes vision/speech, left arm numbness and tingling and jaw pain.  Cholesterol Currently at goal; continue crestor and zetia Continue low cholesterol diet and exercise.  Check lipid panel.   Other abnormal glucose Recent A1Cs at goal Discussed diet/exercise, weight management  Defer A1C; check BMP  BMI 24 Continue to recommend diet heavy in fruits and veggies and low in animal meats, cheeses, and dairy products, appropriate calorie intake Discuss exercise recommendations routinely Continue to monitor weight at each visit  Vitamin D Def At goal at last visit; continue supplementation to maintain goal of 70-100 Defer Vit D level  Anxiety Well managed by current regimen Stress management techniques discussed, increase water, good sleep hygiene discussed, increase exercise, and increase veggies.   Seizures No recent seizures; actively tapering down on depakote per neurology instructions with goal to d/c Defer depakote levels Continue follow up with neurology   Continue diet and meds as discussed. Further disposition pending results of labs. Discussed med's effects and SE's.   Over 30 minutes of exam, counseling, chart review, and critical decision making was performed.   Future Appointments  Date Time Provider [place]  11/26/2017  9:30 AM Unk Pinto, MD [facility] None  03/05/2018  9:00 AM Penumalli, Earlean Polka, MD [facility] None  05/29/2018 10:00 AM Unk Pinto, MD [facility] None    ----------------------------------------------------------------------------------------------------------------------  HPI 72 y.o. female  presents for 3 month follow up on hypertension, cholesterol, glucose management,  weight, anxiety and vitamin D deficiency. Patient was hospitalized 8/31-12/16/2016 with acute confusion DDx atypical partial complex seizure vs acute confusional migraine who was also found to have Cerebral Amyloid Angiopathy. Topomax was switched to Depakote for concern of sedation and cognitive slowing and also for concern of possible seizure, she reports depakote is now in process of tapering off per neurology instructions. EEG was negative and neuro psych testing was inconclusive.  Patient is now seeing Dr Leta Baptist for ongoing neuro evaluation of HA's and cognitive dysfunction. Patient reports occasional Migraines usually aborted with Imitrex.   she has a diagnosis of anxiety and is doing well, tapered off of lexapro and reports only needing xanax 0.25 mg every 2-3 days or so, reports symptoms are well controlled on current regimen.   BMI is Body mass index is 24.48 kg/m., she has been working on diet and exercise. Wt Readings from Last 3 Encounters:  08/16/17 154 lb (69.9 kg)  06/11/17 158 lb (71.7 kg)  05/29/17 159 lb (72.1 kg)   Her blood pressure has been controlled at home, today their BP is BP: 118/74  She does not workout (due to recent neck and back issues), does try to walk a short distance daily. She denies chest pain, shortness of breath, dizziness.   She is on cholesterol medication (crestor 20 mg, zetia 10 mg) and denies myalgias. Her cholesterol is at goal. The cholesterol last visit was:   Lab Results  Component Value Date   CHOL 160 05/08/2017   HDL 66 05/08/2017   LDLCALC 75 05/08/2017   TRIG 107 05/08/2017   CHOLHDL 2.4 05/08/2017    She has been working on diet and exercise for glucose management, and denies foot ulcerations, increased appetite, nausea, paresthesia of the feet, polydipsia, polyuria, visual disturbances, vomiting and  weight loss. Last A1C in the office was:  Lab Results  Component Value Date   HGBA1C 5.4 05/08/2017   Patient is on Vitamin D  supplement.   Lab Results  Component Value Date   VD25OH 92 05/08/2017        Current Medications:  Current Outpatient Medications on File Prior to Visit  Medication Sig  . ALPRAZolam (XANAX) 0.5 MG tablet TAKE 1/2 TO 1 (ONE-HALF TO ONE) TABLET BY MOUTH TWICE DAILY TO THREE TIMES DAILY TO  RESCUE  PANIC  ATTACK  . B Complex Vitamins (VITAMIN-B COMPLEX PO) Take 1 capsule by mouth daily.  Marland Kitchen BIOTIN PO Take 10,000 mg by mouth daily.   . Cholecalciferol (VITAMIN D PO) Take 5,000 Units by mouth daily.  . cyclobenzaprine (FLEXERIL) 10 MG tablet Take 0.5 tablets (5 mg total) by mouth 3 (three) times daily as needed for muscle spasms.  . divalproex (DEPAKOTE) 250 MG DR tablet Take 1 tablet daily or as directed for headaches and seizures  . ezetimibe (ZETIA) 10 MG tablet Take 1 tablet daily for Cholesterol  . famotidine (PEPCID) 40 MG tablet Take 40 mg by mouth daily.   . fluticasone (FLONASE) 50 MCG/ACT nasal spray Place 2 sprays into both nostrils daily as needed for allergies.  Marland Kitchen levocetirizine (XYZAL) 5 MG tablet Take 1 tablet (5 mg total) every evening by mouth.  . meloxicam (MOBIC) 15 MG tablet Take one daily with food for 2 weeks, can take with tylenol, can not take with aleve, iburpofen, then as needed daily for pain  . metoCLOPramide (REGLAN) 10 MG tablet Take 1 tablet (10 mg total) by mouth every 6 (six) hours as needed for nausea (or headache).  . prochlorperazine (COMPAZINE) 10 MG tablet Take 1 tablet 3 to 4 x / day for severe Nausea (Patient taking differently: Take 10 mg by mouth every 6 (six) hours as needed for nausea or vomiting. )  . rosuvastatin (CRESTOR) 20 MG tablet TAKE 1 TABLET BY MOUTH ONCE DAILY  . SUMAtriptan (IMITREX) 20 MG/ACT nasal spray USE ONE DOSE IN THE NOSE NOW, MAY REPEAT IN 2 HOURS. MAX OF 2 DOSES IN 24 HOURS.   No current facility-administered medications on file prior to visit.      Allergies:  Allergies  Allergen Reactions  . Banana Nausea And Vomiting   . Chocolate   . Effexor [Venlafaxine] Other (See Comments)    insomnia  . Maxalt [Rizatriptan Benzoate] Nausea Only and Other (See Comments)    Nausea and excessive salivation  . Tussin [Guaifenesin]     rash  . Zostavax [Zoster Vaccine Live] Itching and Rash     Medical History:  Past Medical History:  Diagnosis Date  . Allergy   . Anemia   . Anxiety   . Arthritis   . Depression   . Hyperlipidemia   . Hypertension   . Migraines   . Prediabetes   . RLS (restless legs syndrome)   . Vitamin D deficiency    Family history- Reviewed and unchanged Social history- Reviewed and unchanged   Review of Systems:  Review of Systems  Constitutional: Negative for malaise/fatigue and weight loss.  HENT: Negative for hearing loss and tinnitus.   Eyes: Negative for blurred vision and double vision.  Respiratory: Negative for cough, shortness of breath and wheezing.   Cardiovascular: Negative for chest pain, palpitations, orthopnea, claudication and leg swelling.  Gastrointestinal: Negative for abdominal pain, blood in stool, constipation, diarrhea, heartburn, melena, nausea and vomiting.  Genitourinary: Negative.   Musculoskeletal: Positive for back pain and neck pain. Negative for joint pain and myalgias.  Skin: Negative for rash.  Neurological: Positive for tremors (Improved, intermittent, bilateral hands). Negative for dizziness, tingling, sensory change, weakness and headaches.  Endo/Heme/Allergies: Negative for polydipsia.  Psychiatric/Behavioral: Negative for depression. The patient is nervous/anxious. The patient does not have insomnia.   All other systems reviewed and are negative.   Physical Exam: BP 118/74   Pulse 75   Temp (!) 97.4 F (36.3 C)   Ht 5' 6.5" (1.689 m)   Wt 154 lb (69.9 kg)   SpO2 97%   BMI 24.48 kg/m  Wt Readings from Last 3 Encounters:  08/16/17 154 lb (69.9 kg)  06/11/17 158 lb (71.7 kg)  05/29/17 159 lb (72.1 kg)   General Appearance: Well  nourished, in no apparent distress. Eyes: PERRLA, EOMs, conjunctiva no swelling or erythema Sinuses: No Frontal/maxillary tenderness ENT/Mouth: Ext aud canals clear, TMs without erythema, bulging. No erythema, swelling, or exudate on post pharynx.  Tonsils not swollen or erythematous. Hearing normal.  Neck: Supple, thyroid normal.  Respiratory: Respiratory effort normal, BS equal bilaterally without rales, rhonchi, wheezing or stridor.  Cardio: RRR with no MRGs. Brisk peripheral pulses without edema.  Abdomen: Soft, + BS.  Non tender, no guarding, rebound, hernias, masses. Lymphatics: Non tender without lymphadenopathy.  Musculoskeletal: Full ROM, 5/5 strength, Normal gait Skin: Warm, dry without rashes, lesions, ecchymosis.  Neuro: Cranial nerves intact. No cerebellar symptoms.  Psych: Awake and oriented X 3, normal affect, Insight and Judgment appropriate.    Izora Ribas, NP 9:39 AM [facility]

## 2017-08-16 ENCOUNTER — Encounter: Payer: Self-pay | Admitting: Adult Health

## 2017-08-16 ENCOUNTER — Ambulatory Visit: Payer: Medicare Other | Admitting: Adult Health

## 2017-08-16 VITALS — BP 118/74 | HR 75 | Temp 97.4°F | Ht 66.5 in | Wt 154.0 lb

## 2017-08-16 DIAGNOSIS — I10 Essential (primary) hypertension: Secondary | ICD-10-CM

## 2017-08-16 DIAGNOSIS — E559 Vitamin D deficiency, unspecified: Secondary | ICD-10-CM

## 2017-08-16 DIAGNOSIS — F411 Generalized anxiety disorder: Secondary | ICD-10-CM | POA: Diagnosis not present

## 2017-08-16 DIAGNOSIS — Z79899 Other long term (current) drug therapy: Secondary | ICD-10-CM | POA: Diagnosis not present

## 2017-08-16 DIAGNOSIS — E785 Hyperlipidemia, unspecified: Secondary | ICD-10-CM

## 2017-08-16 DIAGNOSIS — G40909 Epilepsy, unspecified, not intractable, without status epilepticus: Secondary | ICD-10-CM | POA: Diagnosis not present

## 2017-08-16 LAB — COMPLETE METABOLIC PANEL WITH GFR
AG Ratio: 2.1 (calc) (ref 1.0–2.5)
ALKALINE PHOSPHATASE (APISO): 71 U/L (ref 33–130)
ALT: 14 U/L (ref 6–29)
AST: 20 U/L (ref 10–35)
Albumin: 4.5 g/dL (ref 3.6–5.1)
BILIRUBIN TOTAL: 0.6 mg/dL (ref 0.2–1.2)
BUN: 14 mg/dL (ref 7–25)
CHLORIDE: 102 mmol/L (ref 98–110)
CO2: 29 mmol/L (ref 20–32)
Calcium: 9.8 mg/dL (ref 8.6–10.4)
Creat: 0.8 mg/dL (ref 0.60–0.93)
GFR, EST AFRICAN AMERICAN: 86 mL/min/{1.73_m2} (ref >=60)
GFR, Est Non African American: 74 mL/min/{1.73_m2} (ref >=60)
GLUCOSE: 95 mg/dL (ref 65–99)
Globulin: 2.1 g/dL (calc) (ref 1.9–3.7)
Potassium: 5.1 mmol/L (ref 3.5–5.3)
Sodium: 139 mmol/L (ref 135–146)
TOTAL PROTEIN: 6.6 g/dL (ref 6.1–8.1)

## 2017-08-16 LAB — CBC WITH DIFFERENTIAL/PLATELET
BASOS PCT: 1.7 %
Basophils Absolute: 100 cells/uL (ref 0–200)
EOS ABS: 118 {cells}/uL (ref 15–500)
Eosinophils Relative: 2 %
HCT: 36.9 % (ref 35.0–45.0)
Hemoglobin: 12.6 g/dL (ref 11.7–15.5)
Lymphs Abs: 1800 cells/uL (ref 850–3900)
MCH: 30.4 pg (ref 27.0–33.0)
MCHC: 34.1 g/dL (ref 32.0–36.0)
MCV: 88.9 fL (ref 80.0–100.0)
MONOS PCT: 10.1 %
MPV: 10 fL (ref 7.5–12.5)
Neutro Abs: 3286 cells/uL (ref 1500–7800)
Neutrophils Relative %: 55.7 %
PLATELETS: 288 10*3/uL (ref 140–400)
RBC: 4.15 10*6/uL (ref 3.80–5.10)
RDW: 11.7 % (ref 11.0–15.0)
Total Lymphocyte: 30.5 %
WBC mixed population: 596 cells/uL (ref 200–950)
WBC: 5.9 10*3/uL (ref 3.8–10.8)

## 2017-08-16 LAB — LIPID PANEL
CHOLESTEROL: 170 mg/dL (ref <200)
HDL: 67 mg/dL (ref >50)
LDL CHOLESTEROL (CALC): 85 mg/dL
Non-HDL Cholesterol (Calc): 103 mg/dL (calc) (ref <130)
TRIGLYCERIDES: 87 mg/dL (ref <150)
Total CHOL/HDL Ratio: 2.5 (calc) (ref <5.0)

## 2017-08-16 LAB — TSH: TSH: 2.25 mIU/L (ref 0.40–4.50)

## 2017-08-16 NOTE — Patient Instructions (Signed)
Aim for 7+ servings of fruits and vegetables daily  80+ fluid ounces of water or unsweet tea for healthy kidneys  Limit alcohol intake - max 1 drink per day for women  Limit animal fats in diet for cholesterol and heart health - choose grass fed whenever available  Aim for low stress - take time to unwind and care for your mental health  Aim for 150 min of moderate intensity exercise weekly for heart health, and weights twice weekly for bone health  Aim for 7-9 hours of sleep daily

## 2017-08-18 ENCOUNTER — Other Ambulatory Visit: Payer: Self-pay | Admitting: Physician Assistant

## 2017-09-04 ENCOUNTER — Other Ambulatory Visit: Payer: Self-pay | Admitting: Physician Assistant

## 2017-09-04 DIAGNOSIS — E782 Mixed hyperlipidemia: Secondary | ICD-10-CM

## 2017-09-24 ENCOUNTER — Other Ambulatory Visit: Payer: Self-pay | Admitting: Internal Medicine

## 2017-09-24 DIAGNOSIS — Z1231 Encounter for screening mammogram for malignant neoplasm of breast: Secondary | ICD-10-CM

## 2017-09-24 DIAGNOSIS — E2839 Other primary ovarian failure: Secondary | ICD-10-CM

## 2017-09-24 DIAGNOSIS — S22080A Wedge compression fracture of T11-T12 vertebra, initial encounter for closed fracture: Secondary | ICD-10-CM

## 2017-09-25 ENCOUNTER — Ambulatory Visit
Admission: RE | Admit: 2017-09-25 | Discharge: 2017-09-25 | Disposition: A | Discharge status: Home / Self Care | Payer: Medicare Other | Source: Ambulatory Visit | Attending: Internal Medicine | Admitting: Internal Medicine

## 2017-09-25 DIAGNOSIS — Z1231 Encounter for screening mammogram for malignant neoplasm of breast: Secondary | ICD-10-CM

## 2017-10-05 ENCOUNTER — Encounter: Payer: Self-pay | Admitting: Internal Medicine

## 2017-10-12 ENCOUNTER — Ambulatory Visit
Admission: RE | Admit: 2017-10-12 | Discharge: 2017-10-12 | Disposition: A | Discharge status: Home / Self Care | Payer: Medicare Other | Source: Ambulatory Visit | Attending: Internal Medicine | Admitting: Internal Medicine

## 2017-10-12 DIAGNOSIS — E2839 Other primary ovarian failure: Secondary | ICD-10-CM

## 2017-10-12 DIAGNOSIS — S22080A Wedge compression fracture of T11-T12 vertebra, initial encounter for closed fracture: Secondary | ICD-10-CM

## 2017-10-31 ENCOUNTER — Other Ambulatory Visit: Payer: Self-pay | Admitting: Internal Medicine

## 2017-11-06 ENCOUNTER — Other Ambulatory Visit: Payer: Self-pay | Admitting: Internal Medicine

## 2017-11-13 ENCOUNTER — Other Ambulatory Visit: Payer: Self-pay | Admitting: Adult Health

## 2017-11-26 ENCOUNTER — Encounter: Payer: Self-pay | Admitting: Internal Medicine

## 2017-11-26 ENCOUNTER — Ambulatory Visit (INDEPENDENT_AMBULATORY_CARE_PROVIDER_SITE_OTHER): Payer: Medicare Other | Admitting: Internal Medicine

## 2017-11-26 VITALS — BP 106/76 | HR 76 | Temp 97.1°F | Resp 16 | Ht 66.5 in | Wt 155.8 lb

## 2017-11-26 DIAGNOSIS — E559 Vitamin D deficiency, unspecified: Secondary | ICD-10-CM | POA: Diagnosis not present

## 2017-11-26 DIAGNOSIS — J40 Bronchitis, not specified as acute or chronic: Secondary | ICD-10-CM

## 2017-11-26 DIAGNOSIS — I10 Essential (primary) hypertension: Secondary | ICD-10-CM

## 2017-11-26 DIAGNOSIS — E782 Mixed hyperlipidemia: Secondary | ICD-10-CM | POA: Diagnosis not present

## 2017-11-26 DIAGNOSIS — R7303 Prediabetes: Secondary | ICD-10-CM

## 2017-11-26 DIAGNOSIS — G40909 Epilepsy, unspecified, not intractable, without status epilepticus: Secondary | ICD-10-CM

## 2017-11-26 DIAGNOSIS — R7309 Other abnormal glucose: Secondary | ICD-10-CM | POA: Diagnosis not present

## 2017-11-26 DIAGNOSIS — Z79899 Other long term (current) drug therapy: Secondary | ICD-10-CM

## 2017-11-26 MED ORDER — PREDNISONE 20 MG PO TABS: ORAL_TABLET | ORAL | 0 refills | Status: DC

## 2017-11-26 MED ORDER — AZITHROMYCIN 250 MG PO TABS: ORAL_TABLET | ORAL | 1 refills | Status: DC

## 2017-11-26 NOTE — Progress Notes (Signed)
This very nice 72 y.o.  MWF presents for 6 month follow up with HTN, HLD, Pre-Diabetes and Vitamin D Deficiency. Patient has GERD controlled with her meds. Does c/o Head & chest congestion & a productive cough x 1 week.      Patient was hospitalized in Aug/Sept 2018 wit acute confusion. DDx was Atypical Seizure vs Acute confusional Migraine with a concomitant Dx of Cerebral Amyloid Angiopathy. Patient was treated with Topamax switched to Revloc and Neuro Psych testing was inconclusive.      Patient is monitored since 2010 for Labile  HTN & BP has been controlled at home. Today's BP is at goal - 106/76. Patient has had no complaints of any cardiac type chest pain, palpitations, dyspnea / orthopnea / PND, dizziness, claudication, or dependent edema.     Hyperlipidemia is controlled with diet & meds. Patient denies myalgias or other med SE's. Last Lipids were at goal: Lab Results  Component Value Date   CHOL 170 08/16/2017   HDL 67 08/16/2017   LDLCALC 85 08/16/2017   TRIG 87 08/16/2017   CHOLHDL 2.5 08/16/2017      Also, the patient has history of PreDiabetes (A1c 6.1%/2011) and has had no symptoms of reactive hypoglycemia, diabetic polys, paresthesias or visual blurring.  Last A1c was Normal: Lab Results  Component Value Date   HGBA1C 5.4 05/08/2017      Further, the patient also has history of Vitamin D Deficiency ("18"/2008)  and supplements vitamin D without any suspected side-effects. Last vitamin D was at goal: Lab Results  Component Value Date   VD25OH 92 05/08/2017   Current Outpatient Medications on File Prior to Visit  Medication Sig  . ALPRAZolam  0.5 MG tablet Take 1/2 to 1 tablet 2 to 3 x /day ONLY as needed   . BIOTIN  Take 10,000 mg by mouth daily.   Marland Kitchen VITAMIN D  5,000 Units  Takedaily.  . cyclobenzaprine 10 MG tablet Take 0.5 tablets  3 times daily as needed for muscle spasms.  . divalproex (DEPAKOTE) 250 MG DR Take 1 tablet daily - Takes 1/2 tab 2 x/day   .  escitalopram  20 MG tablet TAKE 1/2 TO 1  TABLET  DAILY   . ezetimibe  10 MG tablet TAKE 1 TABLET  DAILY   . famotidine 40 MG tablet Take 40 mg by mouth daily.   Marland Kitchen FLONASE nasal spray Place 2 sprays into both nostrils daily as needed for allergies.  Marland Kitchen metoCLOPramide 10 MG tablet Take 1 tablet every 6 hrs as needed .  . rosuvastatin  20 MG tablet TAKE 1 TABLET BY MOUTH ONCE DAILY  . IMITREX) 20 MG nasal spray ONE DOSE IN NOSE NOW, MAY REPEAT IN 2 HR    Allergies  Allergen Reactions  . Banana Nausea And Vomiting  . Chocolate   . Effexor [Venlafaxine] Other (See Comments)    insomnia  . Maxalt [Rizatriptan Benzoate] Nausea Only and Other (See Comments)    Nausea and excessive salivation  . Tussin [Guaifenesin]     rash  . Zostavax [Zoster Vaccine Live] Itching and Rash   PMHx:   Past Medical History:  Diagnosis Date  . Allergy   . Anemia   . Anxiety   . Arthritis   . Depression   . Hyperlipidemia   . Hypertension   . Migraines   . Prediabetes   . RLS (restless legs syndrome)   . Vitamin D deficiency  Immunization History  Administered Date(s) Administered  . Influenza, High Dose Seasonal PF 01/01/2014, 03/15/2015, 12/13/2015, 01/03/2017  . Influenza-Unspecified 12/26/2012  . Pneumococcal Conjugate-13 08/06/2014  . Pneumococcal-Unspecified 12/26/2012  . Td 12/26/2012  . Tdap 05/08/2017  . Zoster 12/08/2010   Past Surgical History:  Procedure Laterality Date  . APPENDECTOMY  1959  . [facility]  . KNEE ARTHROSCOPY Bilateral 04/2014   [facility]  . MOHS SURGERY Right 04/2013   Squamous cell cacinoma, neck/upper back  . SPINE SURGERY  2010   C6-C7 fusion   FHx:    Reviewed / unchanged  SHx:    Reviewed / unchanged   Systems Review:  Constitutional: Denies fever, chills, wt changes, headaches, insomnia, fatigue, night sweats, change in appetite. Eyes: Denies redness, blurred vision, diplopia, discharge, itchy, watery eyes.  ENT: Denies  discharge, congestion, post nasal drip, epistaxis, sore throat, earache, hearing loss, dental pain, tinnitus, vertigo, sinus pain, snoring.  CV: Denies chest pain, palpitations, irregular heartbeat, syncope, dyspnea, diaphoresis, orthopnea, PND, claudication or edema. Respiratory: has 1 week productive cough - denies dyspnea, DOE, pleurisy, hoarseness, laryngitis, wheezing.  Gastrointestinal: Denies dysphagia, odynophagia, heartburn, reflux, water brash, abdominal pain or cramps, nausea, vomiting, bloating, diarrhea, constipation, hematemesis, melena, hematochezia  or hemorrhoids. Genitourinary: Denies dysuria, frequency, urgency, nocturia, hesitancy, discharge, hematuria or flank pain. Musculoskeletal: Denies arthralgias, myalgias, stiffness, jt. swelling, pain, limping or strain/sprain.  Skin: Denies pruritus, rash, hives, warts, acne, eczema or change in skin lesion(s). Neuro: No weakness, tremor, incoordination, spasms, paresthesia or pain. Psychiatric: Denies confusion, memory loss or sensory loss. Endo: Denies change in weight, skin or hair change.  Heme/Lymph: No excessive bleeding, bruising or enlarged lymph nodes.  Physical Exam  BP 106/76   Pulse 76   Temp (!) 97.1 F (36.2 C)   Resp 16   Ht 5' 6.5" (1.689 m)   Wt 155 lb 12.8 oz (70.7 kg)   BMI 24.77 kg/m   Appears  well nourished, well groomed  and in no distress. (+) congested cough.  Eyes: PERRLA, EOMs, conjunctiva no swelling or erythema. Sinuses: No frontal/maxillary tenderness ENT/Mouth: EAC's clear, TM's nl w/o erythema, bulging. Nares clear w/o erythema, swelling, exudates. Oropharynx clear without erythema or exudates. Oral hygiene is good. Tongue normal, non obstructing. Hearing intact.  Neck: Supple. Thyroid not palpable. Car 2+/2+ without bruits, nodes or JVD. Chest: Few scattered  Rales &  rhonchi, No wheezing or stridor.  Cor: Heart sounds normal w/ regular rate and rhythm without sig. murmurs, gallops, clicks  or rubs. Peripheral pulses normal and equal  without edema.  Abdomen: Soft & bowel sounds normal. Non-tender w/o guarding, rebound, hernias, masses or organomegaly.  Lymphatics: Unremarkable.  Musculoskeletal: Full ROM all peripheral extremities, joint stability, 5/5 strength and normal gait.  Skin: Warm, dry without exposed rashes, lesions or ecchymosis apparent.  Neuro: Cranial nerves intact, reflexes equal bilaterally. Sensory-motor testing grossly intact. Tendon reflexes grossly intact.  Pysch: Alert & oriented x 3.  Insight and judgement nl & appropriate. No ideations.  Assessment and Plan:  1. Essential hypertension  - Continue medication, monitor blood pressure at home.  - Continue DASH diet.  Reminder to go to the ER if any CP,  SOB, nausea, dizziness, severe HA, changes vision/speech.  - CBC with Differential/Platelet - COMPLETE METABOLIC PANEL WITH GFR - Magnesium - TSH  2. Hyperlipidemia, mixed  - Continue diet/meds, exercise,& lifestyle modifications.  - Continue monitor periodic cholesterol/liver & renal functions   - Lipid panel - TSH  3. Abnormal glucose  - Continue diet, exercise, lifestyle modifications.  - Monitor appropriate labs.  - Hemoglobin A1c - Insulin, random  4. Vitamin D deficiency  - Continue supplementation.   - VITAMIN D 25 Hydroxyl  5. Prediabetes  - Hemoglobin A1c - Insulin, random  6. Seizure disorder (HCC)  - Valproic acid level  7. Bronchitis  - predniSONE (DELTASONE) 20 MG tablet; 1 tab 3 x day for 3 days, then 1 tab 2 x day for 3 days, then 1 tab 1 x day for 5 days  Dispense: 20 tablet  - azithromycin (ZITHROMAX) 250 MG tablet; Take 2 tablets (500 mg) on  Day 1,  followed by 1 tablet (250 mg) once daily on Days 2 through 5.  Dispense: 6 each; Refill: 1  8. Medication management  - CBC with Differential/Platelet - COMPLETE METABOLIC PANEL WITH GFR - Magnesium - Lipid panel - TSH - Hemoglobin A1c - Insulin,  random - VITAMIN D 25 Hydroxy - Valproic acid level       Discussed  regular exercise, BP monitoring, weight control to achieve/maintain BMI less than 25 and discussed med and SE's. Recommended labs to assess and monitor clinical status with further disposition pending results of labs. Over 30 minutes of exam, counseling, chart review was performed.

## 2017-11-26 NOTE — Patient Instructions (Signed)

## 2017-11-27 ENCOUNTER — Other Ambulatory Visit: Payer: Self-pay | Admitting: Internal Medicine

## 2017-11-27 DIAGNOSIS — Z79899 Other long term (current) drug therapy: Secondary | ICD-10-CM

## 2017-11-27 DIAGNOSIS — G40909 Epilepsy, unspecified, not intractable, without status epilepticus: Secondary | ICD-10-CM

## 2017-11-27 LAB — COMPLETE METABOLIC PANEL WITH GFR
AG Ratio: 1.8 (calc) (ref 1.0–2.5)
ALKALINE PHOSPHATASE (APISO): 70 U/L (ref 33–130)
ALT: 8 U/L (ref 6–29)
AST: 17 U/L (ref 10–35)
Albumin: 4.5 g/dL (ref 3.6–5.1)
BILIRUBIN TOTAL: 0.5 mg/dL (ref 0.2–1.2)
BUN: 12 mg/dL (ref 7–25)
CHLORIDE: 104 mmol/L (ref 98–110)
CO2: 23 mmol/L (ref 20–32)
CREATININE: 0.87 mg/dL (ref 0.60–0.93)
Calcium: 10.2 mg/dL (ref 8.6–10.4)
GFR, Est African American: 78 mL/min/{1.73_m2} (ref >=60)
GFR, Est Non African American: 67 mL/min/{1.73_m2} (ref >=60)
GLUCOSE: 103 mg/dL — AB (ref 65–99)
Globulin: 2.5 g/dL (calc) (ref 1.9–3.7)
Potassium: 4.6 mmol/L (ref 3.5–5.3)
Sodium: 137 mmol/L (ref 135–146)
TOTAL PROTEIN: 7 g/dL (ref 6.1–8.1)

## 2017-11-27 LAB — CBC WITH DIFFERENTIAL/PLATELET
BASOS PCT: 0.9 %
Basophils Absolute: 50 cells/uL (ref 0–200)
EOS ABS: 90 {cells}/uL (ref 15–500)
EOS PCT: 1.6 %
HCT: 38.5 % (ref 35.0–45.0)
Hemoglobin: 13.1 g/dL (ref 11.7–15.5)
Lymphs Abs: 1355 cells/uL (ref 850–3900)
MCH: 30.7 pg (ref 27.0–33.0)
MCHC: 34 g/dL (ref 32.0–36.0)
MCV: 90.2 fL (ref 80.0–100.0)
MONOS PCT: 10.6 %
MPV: 9.9 fL (ref 7.5–12.5)
NEUTROS ABS: 3511 {cells}/uL (ref 1500–7800)
Neutrophils Relative %: 62.7 %
PLATELETS: 292 10*3/uL (ref 140–400)
RBC: 4.27 10*6/uL (ref 3.80–5.10)
RDW: 11.8 % (ref 11.0–15.0)
TOTAL LYMPHOCYTE: 24.2 %
WBC mixed population: 594 cells/uL (ref 200–950)
WBC: 5.6 10*3/uL (ref 3.8–10.8)

## 2017-11-27 LAB — LIPID PANEL
Cholesterol: 235 mg/dL — ABNORMAL HIGH (ref <200)
HDL: 65 mg/dL (ref >50)
LDL Cholesterol (Calc): 143 mg/dL (calc) — ABNORMAL HIGH
Non-HDL Cholesterol (Calc): 170 mg/dL (calc) — ABNORMAL HIGH (ref <130)
TRIGLYCERIDES: 143 mg/dL (ref <150)
Total CHOL/HDL Ratio: 3.6 (calc) (ref <5.0)

## 2017-11-27 LAB — HEMOGLOBIN A1C
EAG (MMOL/L): 6.3 (calc)
Hgb A1c MFr Bld: 5.6 % of total Hgb (ref <5.7)
Mean Plasma Glucose: 114 (calc)

## 2017-11-27 LAB — MAGNESIUM: MAGNESIUM: 2.4 mg/dL (ref 1.5–2.5)

## 2017-11-27 LAB — VITAMIN D 25 HYDROXY (VIT D DEFICIENCY, FRACTURES): VIT D 25 HYDROXY: 77 ng/mL (ref 30–100)

## 2017-11-27 LAB — VALPROIC ACID LEVEL: Valproic Acid Lvl: 24.6 mg/L — ABNORMAL LOW (ref 50.0–100.0)

## 2017-11-27 LAB — TSH: TSH: 4.63 mIU/L — ABNORMAL HIGH (ref 0.40–4.50)

## 2017-11-27 LAB — INSULIN, RANDOM: INSULIN: 7.6 u[IU]/mL (ref 2.0–19.6)

## 2018-02-01 ENCOUNTER — Other Ambulatory Visit: Payer: Self-pay | Admitting: Internal Medicine

## 2018-02-01 DIAGNOSIS — G43009 Migraine without aura, not intractable, without status migrainosus: Secondary | ICD-10-CM

## 2018-02-02 ENCOUNTER — Other Ambulatory Visit: Payer: Self-pay | Admitting: Internal Medicine

## 2018-02-02 MED ORDER — ALPRAZOLAM 0.5 MG PO TABS: ORAL_TABLET | ORAL | 0 refills | Status: DC

## 2018-02-25 DIAGNOSIS — M810 Age-related osteoporosis without current pathological fracture: Secondary | ICD-10-CM | POA: Insufficient documentation

## 2018-02-25 DIAGNOSIS — K219 Gastro-esophageal reflux disease without esophagitis: Secondary | ICD-10-CM | POA: Insufficient documentation

## 2018-02-25 DIAGNOSIS — M8580 Other specified disorders of bone density and structure, unspecified site: Secondary | ICD-10-CM | POA: Insufficient documentation

## 2018-02-25 NOTE — Progress Notes (Signed)
MEDICARE ANNUAL WELLNESS VISIT AND FOLLOW UP  Assessment:   Encounter for Medicare annual wellness exam  Essential hypertension - continue medications, DASH diet, exercise and monitor at home. Call if greater than 130/80.  -     CBC with Differential/Platelet -     CMP/GFR -     TSH  Hyperlipidemia, unspecified hyperlipidemia type -     Lipid panel -continue medications, check lipids, decrease fatty foods, increase activity.   Medication management -     Magnesium  Prediabetes Discussed general issues about diabetes pathophysiology and management., Educational material distributed., Suggested low cholesterol diet., Encouraged aerobic exercise., Discussed foot care., Reminded to get yearly retinal exam.  Aortic atherosclerosis ([place]) Control blood pressure, cholesterol, glucose, increase exercise.   History of TIA (transient ischemic attack) Control blood pressure, cholesterol, glucose, increase exercise.   Migraine without aura and without status migrainosus, not intractable Much improved over last 1-2 years Continue meds, discussed rebound HA, do not take rescue more than 2 days a week  Seizure disoder ([place])  Followed by neurology Dr. Leta Baptist Continue meds, check depakote levels  Skin cancer Continue follow up  Vitamin D deficiency -     VITAMIN D 25 Hydroxy (Vit-D Deficiency, Fractures)  Generalized anxiety disorder Well managed by current regimen; continue medications  Stress management techniques discussed, increase water, good sleep hygiene discussed, increase exercise, and increase veggies.   Incontinence in female Doing better  GERD Well managed on current medications Discussed diet, avoiding triggers and other lifestyle changes  Compression fracture T12 ([place]) Dr. Arnoldo Morale didn't recommend kyphoplasty Dexa was ordered, osteopenia Monitor, consider core strengthening exercises with PT after TKA  Osteopenia - get dexa 2021, continue Vit D and Ca, weight  bearing exercises  Cellulitis right finger - keflex 500 mg QID x 10 days sent in, monitor closely and follow up if not improving  Over 30 minutes of exam, counseling, chart review, and critical decision making was performed  Future Appointments  Date Time Provider [place]  03/05/2018  9:00 AM Penumalli, Earlean Polka, MD [facility] None  05/29/2018 10:00 AM Unk Pinto, MD [facility] None     Plan:   During the course of the visit the patient was educated and counseled about appropriate screening and preventive services including:    Pneumococcal vaccine   Influenza vaccine  Td vaccine  Screening electrocardiogram  Screening mammography  Bone densitometry screening  Colorectal cancer screening  Diabetes screening  Glaucoma screening  Nutrition counseling   Advanced directives: given info/requested  Subjective:   Deborah [name] is a 72 y.o. female who presents for Medicare Annual Wellness Visit and 3 month follow up on hypertension, prediabetes, hyperlipidemia, vitamin D def.   Her first grandson was born this year.   She had a fall this year 05/2008 and X-rays showed a 10% compression of T-12 and she was seen by Dr Arnoldo Morale who did not recommend Kyphoplasty. She wears lumbar brace most days, takes tylenol as needed.   She has upcoming knee arthroplasty scheduled this December 2019.   Patient was hospitalized 8/31-12/16/2016 with acute confusion DDx atypical partial complex seizure vs acute confusional migraine who was also found to have Cerebral Amyloid Angiopathy. EEG was negative and neuro psych testing was inconclusive.  Patient is now seeing Dr Leta Baptist for ongoing neuro evaluation of HA's and cognitive dysfunction. Patient reports occasional Migraines usually aborted with Imitrex, though much improved since last year.   she has a diagnosis of anxiety, dog passed away this week,  on lexapro 20 mg daily and reports only needing xanax 0.25 mg every 2-3 days or  so, reports symptoms are well controlled on current regimen.   Had mohs procedure for squamous cell carcinoma at [facility], goes once a year.  She does have stress/over flow incontinence.   BMI is Body mass index is 24.42 kg/m., she has not been working on diet and exercise. Wt Readings from Last 3 Encounters:  02/26/18 153 lb 9.6 oz (69.7 kg)  11/26/17 155 lb 12.8 oz (70.7 kg)  08/16/17 154 lb (69.9 kg)   Her blood pressure has been controlled at home, today their BP is BP: 102/68 She does workout. She denies chest pain, shortness of breath, dizziness.   She is on cholesterol medication (crestor 20 mg daily, zetia 10 mg) and denies myalgias. Her cholesterol is not at goal. The cholesterol last visit was:   Lab Results  Component Value Date   CHOL 235 (H) 11/26/2017   HDL 65 11/26/2017   LDLCALC 143 (H) 11/26/2017   TRIG 143 11/26/2017   CHOLHDL 3.6 11/26/2017   She has been working on diet and exercise for prediabetes x 2010, and denies paresthesia of the feet, polydipsia, polyuria and visual disturbances. Last A1C in the office was:  Lab Results  Component Value Date   HGBA1C 5.6 11/26/2017   Patient is on Vitamin D supplement: Lab Results  Component Value Date   VD25OH 77 11/26/2017     Lab Results  Component Value Date   GFRNONAA 67 11/26/2017     Medication Review Current Outpatient Medications on File Prior to Visit  Medication Sig Dispense Refill  . ALPRAZolam (XANAX) 0.5 MG tablet Take 1/2 to 1 tablet 2 to 3 x /day ONLY as needed to rescue Panic / Anxiety attacks 60 tablet 0  . BIOTIN PO Take 10,000 mg by mouth daily.     . Cholecalciferol (VITAMIN D PO) Take 5,000 Units by mouth daily.    . cyclobenzaprine (FLEXERIL) 10 MG tablet Take 0.5 tablets (5 mg total) by mouth 3 (three) times daily as needed for muscle spasms. 30 tablet 0  . divalproex (DEPAKOTE) 250 MG DR tablet Take 1 tablet daily or as directed for headaches and seizures 180 tablet 1  .  escitalopram (LEXAPRO) 20 MG tablet TAKE 1/2 TO 1 (ONE-HALF TO ONE) TABLET BY MOUTH ONCE DAILY PREVENTION  OF  CHRONIC  ANXIETY  AND  PANIC  ATTACKS 90 tablet 1  . ezetimibe (ZETIA) 10 MG tablet TAKE 1 TABLET BY MOUTH ONCE DAILY FOR CHOLESTEROL 90 tablet 1  . famotidine (PEPCID) 40 MG tablet Take 40 mg by mouth daily.     . fluticasone (FLONASE) 50 MCG/ACT nasal spray Place 2 sprays into both nostrils daily as needed for allergies. 16 g 3  . metoCLOPramide (REGLAN) 10 MG tablet Take 1 tablet (10 mg total) by mouth every 6 (six) hours as needed for nausea (or headache). 30 tablet 0  . rosuvastatin (CRESTOR) 20 MG tablet TAKE 1 TABLET BY MOUTH ONCE DAILY 90 tablet 1  . SUMAtriptan (IMITREX) 20 MG/ACT nasal spray USE ONE DOSE IN THE NOSE NOW, MAY REPEAT IN 2 HOURS. MAX OF 2 DOSES IN 24 HOURS. 12 Inhaler 2  . topiramate (TOPAMAX) 100 MG tablet TAKE ONE TABLET BY MOUTH TWICE DAILY FOR  HEADACHE  PREVENTION 180 tablet 1   No current facility-administered medications on file prior to visit.     Current Problems (verified) Patient Active Problem List  Diagnosis Date Noted  . GERD (gastroesophageal reflux disease) 02/25/2018  . Osteopenia 02/25/2018  . Compression fracture of T12 vertebra ([place]) 05/30/2017  . Aortic atherosclerosis ([place]) 02/03/2017  . Seizure disorder ([place]) 12/15/2016  . History of TIA (transient ischemic attack) 12/15/2016  . BMI 24.0-24.9, adult 03/12/2015  . Skin cancer 08/06/2014  . Migraine 08/06/2014  . Generalized anxiety disorder 08/06/2014  . Incontinence in female 08/06/2014  . Prediabetes 07/01/2013  . Medication management 07/01/2013  . Hypertension   . Hyperlipidemia   . Vitamin D deficiency     Screening Tests Immunization History  Administered Date(s) Administered  . Influenza, High Dose Seasonal PF 01/01/2014, 03/15/2015, 12/13/2015, 01/03/2017, 02/05/2018  . Influenza-Unspecified 12/26/2012  . Pneumococcal Conjugate-13 08/06/2014  .  Pneumococcal-Unspecified 12/26/2012  . Td 12/26/2012  . Tdap 05/08/2017  . Zoster 12/08/2010   Preventative care: Last colonoscopy: 07/2017, due 2021 EGD 10/2016 Last mammogram: 09/2017 Last pap smear/pelvic exam: 2013  DEXA:09/2017 T -2.1 \\MRI  12/2016 Ct head 11/2016  Prior vaccinations: TD : 12/2012 Influenza: 12/2017 Pneumococcal: 12/2012 Prevnar 13: 2016  Shingles/Zostavax: 11/2010  Names of Other Physician/Practitioners you currently use: 1. [facility]- here for primary care 2. Dr. ?, eye doctor, last visit 2017 3. Dr Randie Heinz, dentist, last visit 2019, q9m  Patient Care Team: Unk Pinto, MD as PCP - General (Internal Medicine) Richmond Campbell, MD as Consulting Physician (Gastroenterology) Shirley Muscat Loreen Freud, MD as Referring Physician (Optometry) Berenice Primas, MD as Referring Physician (Orthopedic Surgery) Janann August, MD as Referring Physician (Dermatology)  Allergies Allergies  Allergen Reactions  . Banana Nausea And Vomiting  . Chocolate   . Effexor [Venlafaxine] Other (See Comments)    insomnia  . Maxalt [Rizatriptan Benzoate] Nausea Only and Other (See Comments)    Nausea and excessive salivation  . Tussin [Guaifenesin]     rash  . Zostavax [Zoster Vaccine Live] Itching and Rash    SURGICAL HISTORY She  has a past surgical history that includes Spine surgery (2010); Mohs surgery (Right, 04/2013); Knee arthroscopy (Bilateral, 04/2014); Appendectomy (1959); and Cesarean section (1978). FAMILY HISTORY Her family history includes Diabetes in her mother; Heart disease in her father and mother; Hyperlipidemia in her sister; Hypertension in her brother and father; Stroke in her father. SOCIAL HISTORY She  reports that she quit smoking about 31 years ago. Her smoking use included cigarettes. She has a 4.00 pack-year smoking history. She has never used smokeless tobacco. She reports that she drinks  alcohol. She reports that she does not use drugs.  MEDICARE WELLNESS OBJECTIVES: Depression/mood screen:   Depression screen [facility] 2/9 02/26/2018  Decreased Interest 0  Down, Depressed, Hopeless 1  PHQ - 2 Score 1   Hearing: decreased Visual acuity: normal,  does perform annual eye exam  ADLs:  In your present state of health, do you have any difficulty performing the following activities: 11/26/2017 05/08/2017  Hearing? N N  Vision? N N  Difficulty concentrating or making decisions? N N  Walking or climbing stairs? N N  Dressing or bathing? N N  Doing errands, shopping? N N  Some recent data might be hidden    Fall risk: Low- moderate Risk Cognitive Testing  Alert? Yes  Normal Appearance?Yes  Oriented to person? Yes  Place? Yes   Time? Yes  Recall of three objects?  Yes  Can perform simple calculations? Yes  Displays appropriate judgment?Yes  Can read the correct time from a watch face?Yes  EOL planning: Does Patient  Have a Medical Advance Directive?: No Would patient like information on creating a medical advance directive?: No - Patient declined     Objective:   Blood pressure 102/68, pulse 77, temperature (!) 97.3 F (36.3 C), height 5' 6.5" (1.689 m), weight 153 lb 9.6 oz (69.7 kg), SpO2 97 %. Body mass index is 24.42 kg/m.  General appearance: alert, no distress, WD/WN,  female HEENT: normocephalic, sclerae anicteric, TMs pearly, nares patent, no discharge or erythema, pharynx normal Oral cavity: MMM, no lesions Neck: supple, no lymphadenopathy, no thyromegaly, no masses Heart: RRR, normal S1, S2, no murmurs Lungs: CTA bilaterally, no wheezes, rhonchi, or rales Abdomen: +bs, soft, non tender, non distended, no masses, no hepatomegaly, no splenomegaly Musculoskeletal: nontender, no swelling, no obvious deformity Extremities: no edema, no cyanosis, no clubbing; she has erythema and warmth to right hand 2nd digit with laceration scabbedo ver Pulses: 2+ symmetric,  upper and lower extremities, normal cap refill Neurological: alert, oriented x 3, CN2-12 intact, strength normal upper extremities and lower extremities, sensation normal throughout, DTRs 2+ throughout, no cerebellar signs, gait normal Psychiatric: normal affect, behavior normal, pleasant  Breast: defer Gyn: defer Rectal: defer   Medicare Attestation I have personally reviewed: The patient's medical and social history Their use of alcohol, tobacco or illicit drugs Their current medications and supplements The patient's functional ability including ADLs,fall risks, home safety risks, cognitive, and hearing and visual impairment Diet and physical activities Evidence for depression or mood disorders  The patient's weight, height, BMI, and visual acuity have been recorded in the chart.  I have made referrals, counseling, and provided education to the patient based on review of the above and I have provided the patient with a written personalized care plan for preventive services.     Izora Ribas, NP   02/26/2018

## 2018-02-26 ENCOUNTER — Encounter: Payer: Self-pay | Admitting: Adult Health

## 2018-02-26 ENCOUNTER — Ambulatory Visit: Payer: Medicare Other | Admitting: Adult Health

## 2018-02-26 VITALS — BP 102/68 | HR 77 | Temp 97.3°F | Ht 66.5 in | Wt 153.6 lb

## 2018-02-26 DIAGNOSIS — Z8673 Personal history of transient ischemic attack (TIA), and cerebral infarction without residual deficits: Secondary | ICD-10-CM

## 2018-02-26 DIAGNOSIS — M8589 Other specified disorders of bone density and structure, multiple sites: Secondary | ICD-10-CM

## 2018-02-26 DIAGNOSIS — R6889 Other general symptoms and signs: Secondary | ICD-10-CM | POA: Diagnosis not present

## 2018-02-26 DIAGNOSIS — Z6824 Body mass index (BMI) 24.0-24.9, adult: Secondary | ICD-10-CM

## 2018-02-26 DIAGNOSIS — Z0001 Encounter for general adult medical examination with abnormal findings: Secondary | ICD-10-CM | POA: Diagnosis not present

## 2018-02-26 DIAGNOSIS — F411 Generalized anxiety disorder: Secondary | ICD-10-CM

## 2018-02-26 DIAGNOSIS — G43009 Migraine without aura, not intractable, without status migrainosus: Secondary | ICD-10-CM | POA: Diagnosis not present

## 2018-02-26 DIAGNOSIS — C4490 Unspecified malignant neoplasm of skin, unspecified: Secondary | ICD-10-CM

## 2018-02-26 DIAGNOSIS — Z0000 Encounter for general adult medical examination without abnormal findings: Secondary | ICD-10-CM

## 2018-02-26 DIAGNOSIS — Z79899 Other long term (current) drug therapy: Secondary | ICD-10-CM

## 2018-02-26 DIAGNOSIS — K219 Gastro-esophageal reflux disease without esophagitis: Secondary | ICD-10-CM

## 2018-02-26 DIAGNOSIS — I10 Essential (primary) hypertension: Secondary | ICD-10-CM | POA: Diagnosis not present

## 2018-02-26 DIAGNOSIS — E785 Hyperlipidemia, unspecified: Secondary | ICD-10-CM

## 2018-02-26 DIAGNOSIS — R7303 Prediabetes: Secondary | ICD-10-CM

## 2018-02-26 DIAGNOSIS — E559 Vitamin D deficiency, unspecified: Secondary | ICD-10-CM

## 2018-02-26 DIAGNOSIS — S22080D Wedge compression fracture of T11-T12 vertebra, subsequent encounter for fracture with routine healing: Secondary | ICD-10-CM

## 2018-02-26 DIAGNOSIS — G40909 Epilepsy, unspecified, not intractable, without status epilepticus: Secondary | ICD-10-CM

## 2018-02-26 DIAGNOSIS — I700 Atherosclerosis of aorta: Secondary | ICD-10-CM

## 2018-02-26 DIAGNOSIS — L03011 Cellulitis of right finger: Secondary | ICD-10-CM

## 2018-02-26 DIAGNOSIS — R32 Unspecified urinary incontinence: Secondary | ICD-10-CM

## 2018-02-26 MED ORDER — CEPHALEXIN 500 MG PO CAPS: 500.0000 mg | ORAL_CAPSULE | Freq: Four times a day (QID) | ORAL | 0 refills | Status: DC

## 2018-02-26 NOTE — Patient Instructions (Addendum)
Deborah [name] , Thank you for taking time to come for your Medicare Wellness Visit. I appreciate your ongoing commitment to your health goals. Please review the following plan we discussed and let me know if I can assist you in the future.   These are the goals we discussed: Goals    . Exercise 150 min/wk Moderate Activity     Weight bearing/resistance exercises twice weekly       This is a list of the screening recommended for you and due dates:  Health Maintenance  Topic Date Due  . Colon Cancer Screening  08/03/2019  . Mammogram  09/26/2019  . Tetanus Vaccine  05/09/2027  . Flu Shot  Completed  . DEXA scan (bone density measurement)  Completed  .  Hepatitis C: One time screening is recommended by Center for Disease Control  (CDC) for  adults born from [birth year] through 1965.   Completed  . Pneumonia vaccines  Completed     Know what a healthy weight is for you (roughly BMI <25) and aim to maintain this  Aim for 7+ servings of fruits and vegetables daily  65-80+ fluid ounces of water or unsweet tea for healthy kidneys  Limit to max 1 drink of alcohol per day; avoid smoking/tobacco  Limit animal fats in diet for cholesterol and heart health - choose grass fed whenever available  Avoid highly processed foods, and foods high in saturated/trans fats  Aim for low stress - take time to unwind and care for your mental health  Aim for 150 min of moderate intensity exercise weekly for heart health, and weights twice weekly for bone health  Aim for 7-9 hours of sleep daily      Preventing Osteoporosis, Adult Osteoporosis is a condition that causes the bones to get weaker. With osteoporosis, the bones become thinner, and the normal spaces in bone tissue become larger. This can make the bones weak and cause them to break more easily. People who have osteoporosis are more likely to break their wrist, spine, or hip. Even a minor accident or injury can be enough to break weak  bones. Osteoporosis can occur with aging. Your body constantly replaces old bone tissue with new tissue. As you get older, you may lose bone tissue more quickly, or it may be replaced more slowly. Osteoporosis is more likely to develop if you have poor nutrition or do not get enough calcium or vitamin D. Other lifestyle factors can also play a role. By making some diet and lifestyle changes, you can help to keep your bones healthy and help to prevent osteoporosis. What nutrition changes can be made? Nutrition plays an important role in maintaining healthy, strong bones.  Make sure you get enough calcium every day from food or from calcium supplements. ? If you are age 47 or younger, aim to get 1,000 mg of calcium every day. ? If you are older than age 72, aim to get 1,200 mg of calcium every day.  Try to get enough vitamin D every day. ? If you are age 44 or younger, aim to get 600 international units (IU) every day. ? If you are older than age 72, aim to get 800 international units every day.  Follow a healthy diet. Eat plenty of foods that contain calcium and vitamin D. ? Calcium is in milk, cheese, yogurt, and other dairy products. Some fish and vegetables are also good sources of calcium. Many foods such as cereals and breads have had calcium added to  them (are fortified). Check nutrition labels to see how much calcium is in a food or drink. ? Foods that contain vitamin D include milk, cereals, salmon, and tuna. Your body also makes vitamin D when you are out in the sun. Bare skin exposure to the sun on your face, arms, legs, or back for no more than 30 minutes a day, 2 times per week is more than enough. Beyond that, it is important to use sunblock to protect your skin from sunburn, which increases your risk for skin cancer.  What lifestyle changes can be made? Making changes in your everyday life can also play an important role in preventing osteoporosis.  Stay active and get exercise every  day. Ask your health care provider what types of exercise are best for you.  Do not use any products that contain nicotine or tobacco, such as cigarettes and e-cigarettes. If you need help quitting, ask your health care provider.  Limit alcohol intake to no more than 1 drink a day for nonpregnant women and 2 drinks a day for men. One drink equals 12 oz of beer, 5 oz of wine, or 1 oz of hard liquor.  Why are these changes important? Making these nutrition and lifestyle changes can:  Help you develop and maintain healthy, strong bones.  Prevent loss of bone mass and the problems that are caused by that loss, such as broken bones and delayed healing.  Make you feel better mentally and physically.  What can happen if changes are not made? Problems that can result from osteoporosis can be very serious. These may include:  A higher risk of broken bones that are painful and do not heal well.  Physical malformations, such as a collapsed spine or a hunched back.  Problems with movement.  Where to find support: If you need help making changes to prevent osteoporosis, talk with your health care provider. You can ask for a referral to a diet and nutrition specialist (dietitian) and a physical therapist. Where to find more information: Learn more about osteoporosis from:  NIH Osteoporosis and Related [place]: www.niams.GolfingGoddess.com.br  U.S. Office on Women's Health: SouvenirBaseball.es.html  National Osteoporosis Foundation: ProfilePeek.ch  Summary  Osteoporosis is a condition that causes weak bones that are more likely to break.  Eating a healthy diet and making sure you get enough calcium and vitamin D can help prevent osteoporosis.  Other ways to reduce your risk of osteoporosis include getting regular exercise and avoiding alcohol  and products that contain nicotine or tobacco. This information is not intended to replace advice given to you by your health care provider. Make sure you discuss any questions you have with your health care provider. Document Released: 04/18/2015 Document Revised: 12/13/2015 Document Reviewed: 12/13/2015 Elsevier Interactive Patient Education  2018 [place].     Spinal Compression Fracture A spinal compression fracture is a collapse of the bones that form the spine (vertebrae). With this type of fracture, the vertebrae become squashed (compressed) into a wedge shape. Most compression fractures happen in the middle or lower part of the spine. What are the causes? This condition may be caused by:  Thinning and loss of density in the bones (osteoporosis). This is the most common cause.  A fall.  A car or motorcycle accident.  Cancer.  Trauma, such as a heavy, direct hit to the head.  What increases the risk? You may be at greater risk for a spinal compression fracture if you:  Are 50  years old or older.  Have osteoporosis.  Have certain types of cancer, including: ? Multiple myeloma. ? Lymphoma. ? Prostate cancer. ? Lung cancer. ? Breast cancer.  What are the signs or symptoms? Symptoms of this condition include:  Severe pain.  Pain that gets worse over time.  Pain that is worse when you stand, walk, sit, or bend.  Sudden pain that is so bad that it is hard for you to move.  Bending or humping of the spine.  Gradual loss of height.  Numbness, tingling, or weakness in the back and legs.  Trouble walking.  Your symptoms will depend on the cause of the fracture and how quickly it develops. For example, fractures that are caused by osteoporosis can cause few symptoms, no symptoms, or symptoms that develop slowly over time. How is this diagnosed? This condition may be diagnosed based on symptoms, medical history, and a physical exam. During the physical exam,  your health care provider may tap along the length of your spine to check for tenderness. Tests may be done to confirm the diagnosis. They may include:  A bone density test to check for osteoporosis.  Imaging tests, such as a spine X-ray, a CT scan, or MRI.  How is this treated? Treatment for this condition depends on the cause and severity of the condition.Some fractures, such as those that are caused by osteoporosis, may heal on their own with supportive care. This may include:  Pain medicine.  Rest.  A back brace.  Physical therapy exercises.  Medicine that reduces bone pain.  Calcium and vitamin D supplements.  Fractures that cause the back to become misshapen, cause nerve pain or weakness, or do not respond to other treatment may be treated with a surgical procedure, such as:  Vertebroplasty. In this procedure, bone cement is injected into the collapsed vertebrae to stabilize them.  Balloon kyphoplasty. In this procedure, the collapsed vertebrae are expanded with a balloon and then bone cement is injected into them.  Spinal fusion. In this procedure, the collapsed vertebrae are connected (fused) to normal vertebrae.  Follow these instructions at home: General instructions  Take medicines only as directed by your health care provider.  Do not drive or operate heavy machinery while taking pain medicine.  If directed, apply ice to the injured area: ? Put ice in a plastic bag. ? Place a towel between your skin and the bag. ? Leave the ice on for 30 minutes every two hours at first. Then apply the ice as needed.  Wear your neck brace or back brace as directed by your health care provider.  Do not drink alcohol. Alcohol can interfere with your treatment.  Keep all follow-up visits as directed by your health care provider. This is important. It can help to prevent permanent injury, disability, and long-lasting (chronic) pain. Activity  Stay in bed (on bed rest) only as  directed by your health care provider. Being on bed rest for too long can make your condition worse.  Return to your normal activities as directed by your health care provider. Ask what activities are safe for you.  Do exercises to improve motion and strength in your back (physical therapy), as recommended by your health care provider.  Exercise regularly as directed by your health care provider. Contact a health care provider if:  You have a fever.  You develop a cough that makes your pain worse.  Your pain medicine is not helping.  Your pain does not get better  over time.  You cannot return to your normal activities as planned or expected. Get help right away if:  Your pain is very bad and it suddenly gets worse.  You are unable to move any body part (paralysis) that is below the level of your injury.  You have numbness, tingling, or weakness in any body part that is below the level of your injury.  You cannot control your bladder or bowels. This information is not intended to replace advice given to you by your health care provider. Make sure you discuss any questions you have with your health care provider. Document Released: 04/03/2005 Document Revised: 11/30/2015 Document Reviewed: 04/07/2014 Elsevier Interactive Patient Education  [employer].

## 2018-02-27 LAB — COMPLETE METABOLIC PANEL WITH GFR
AG Ratio: 2 (calc) (ref 1.0–2.5)
ALBUMIN MSPROF: 4.2 g/dL (ref 3.6–5.1)
ALKALINE PHOSPHATASE (APISO): 59 U/L (ref 33–130)
ALT: 9 U/L (ref 6–29)
AST: 16 U/L (ref 10–35)
BILIRUBIN TOTAL: 0.4 mg/dL (ref 0.2–1.2)
BUN: 8 mg/dL (ref 7–25)
CHLORIDE: 106 mmol/L (ref 98–110)
CO2: 25 mmol/L (ref 20–32)
Calcium: 10.1 mg/dL (ref 8.6–10.4)
Creat: 0.76 mg/dL (ref 0.60–0.93)
GFR, Est African American: 91 mL/min/{1.73_m2} (ref >=60)
GFR, Est Non African American: 79 mL/min/{1.73_m2} (ref >=60)
GLOBULIN: 2.1 g/dL (ref 1.9–3.7)
Glucose, Bld: 97 mg/dL (ref 65–99)
Potassium: 4.8 mmol/L (ref 3.5–5.3)
SODIUM: 139 mmol/L (ref 135–146)
Total Protein: 6.3 g/dL (ref 6.1–8.1)

## 2018-02-27 LAB — CBC WITH DIFFERENTIAL/PLATELET
BASOS ABS: 59 {cells}/uL (ref 0–200)
Basophils Relative: 1.1 %
EOS ABS: 108 {cells}/uL (ref 15–500)
Eosinophils Relative: 2 %
HEMATOCRIT: 36.2 % (ref 35.0–45.0)
HEMOGLOBIN: 12.3 g/dL (ref 11.7–15.5)
LYMPHS ABS: 1696 {cells}/uL (ref 850–3900)
MCH: 30.7 pg (ref 27.0–33.0)
MCHC: 34 g/dL (ref 32.0–36.0)
MCV: 90.3 fL (ref 80.0–100.0)
MONOS PCT: 9.8 %
MPV: 10.5 fL (ref 7.5–12.5)
NEUTROS ABS: 3008 {cells}/uL (ref 1500–7800)
Neutrophils Relative %: 55.7 %
Platelets: 256 10*3/uL (ref 140–400)
RBC: 4.01 10*6/uL (ref 3.80–5.10)
RDW: 12 % (ref 11.0–15.0)
Total Lymphocyte: 31.4 %
WBC mixed population: 529 cells/uL (ref 200–950)
WBC: 5.4 10*3/uL (ref 3.8–10.8)

## 2018-02-27 LAB — HEMOGLOBIN A1C
Hgb A1c MFr Bld: 5.6 % of total Hgb (ref <5.7)
Mean Plasma Glucose: 114 (calc)
eAG (mmol/L): 6.3 (calc)

## 2018-02-27 LAB — VALPROIC ACID LEVEL: Valproic Acid Lvl: 43.9 mg/L — ABNORMAL LOW (ref 50.0–100.0)

## 2018-02-27 LAB — LIPID PANEL
Cholesterol: 147 mg/dL (ref <200)
HDL: 60 mg/dL (ref >50)
LDL Cholesterol (Calc): 69 mg/dL (calc)
NON-HDL CHOLESTEROL (CALC): 87 mg/dL (ref <130)
Total CHOL/HDL Ratio: 2.5 (calc) (ref <5.0)
Triglycerides: 95 mg/dL (ref <150)

## 2018-02-27 LAB — MAGNESIUM: MAGNESIUM: 2.2 mg/dL (ref 1.5–2.5)

## 2018-02-27 LAB — TSH: TSH: 1.36 m[IU]/L (ref 0.40–4.50)

## 2018-02-28 ENCOUNTER — Other Ambulatory Visit: Payer: Self-pay | Admitting: Orthopedic Surgery

## 2018-03-05 ENCOUNTER — Encounter: Payer: Self-pay | Admitting: Diagnostic Neuroimaging

## 2018-03-05 ENCOUNTER — Ambulatory Visit: Payer: Medicare Other | Admitting: Diagnostic Neuroimaging

## 2018-03-05 VITALS — BP 94/69 | HR 71 | Ht 66.5 in | Wt 152.4 lb

## 2018-03-05 DIAGNOSIS — G43009 Migraine without aura, not intractable, without status migrainosus: Secondary | ICD-10-CM | POA: Diagnosis not present

## 2018-03-05 DIAGNOSIS — R569 Unspecified convulsions: Secondary | ICD-10-CM | POA: Diagnosis not present

## 2018-03-05 DIAGNOSIS — R413 Other amnesia: Secondary | ICD-10-CM | POA: Diagnosis not present

## 2018-03-05 NOTE — Progress Notes (Signed)
[facility]  PATIENT: Deborah [name] DOB: 07-11-1945  REFERRING CLINICIAN: Mike Craze HISTORY FROM: patient and husband REASON FOR VISIT: follow up   HISTORICAL  CHIEF COMPLAINT:  Chief Complaint  Patient presents with  . Follow-up    Rm 7, husband  . migraines and memory loss    headaches better, (nuts and chocolates trigger),  Still having issues with memory loss at times.      HISTORY OF PRESENT ILLNESS:   UPDATE (03/05/18, VRP): Since last visit, doing about the same. Still with headaches and memory loss. Apparently PCP has been adjusting depakote dosing based on levels. Also TPX was restarted at some point.  Continues with mild daily headaches.  No major migraines.  No seizures.    UPDATE (02/27/17, VRP): Since last visit, doing better. Tolerating divalproex. No alleviating or aggravating factors. No more seizures. Memory loss improved. Still with a lot of headaches (right or left side; sharp pain; with nausea/vomiting; with photo/phonophobia; 3-4 days per week; hours-days; no aura; triggers --> chocolate, bananas; headaches started at age 63 years old; + fam hx of migraine mother side of family).   PRIOR HPI (12/25/16): 72 year old female here for evaluation of confusion and word finding difficulty. Patient was admitted to the hospital on 12/15/16 for headache, confusion, word finding difficulty. Apparently she woke up, went to the grocery, and when she returned home felt confused. She apparently talked to her son over the phone who could not understand what she was saying. Patient went to the neighbor's house and they were able to call for help. MRI of the brain showed evidence of possible cerebral amyloid angiopathy. Patient was diagnosed with possible underlying neurodegenerative process with superimposed confusion related to topiramate. Complex partial seizure was also raised as a possibility. She was discharged on Depakote instead of Topamax. Patient also  reports history of headaches and migraines at least once per month.   REVIEW OF SYSTEMS: Full 14 system review of systems performed and negative with exception of: negative.    ALLERGIES: Allergies  Allergen Reactions  . Banana Nausea And Vomiting  . Chocolate   . Effexor [Venlafaxine] Other (See Comments)    insomnia  . Maxalt [Rizatriptan Benzoate] Nausea Only and Other (See Comments)    Nausea and excessive salivation  . Tussin [Guaifenesin]     rash  . Zostavax [Zoster Vaccine Live] Itching and Rash    HOME MEDICATIONS: Outpatient Medications Prior to Visit  Medication Sig Dispense Refill  . ALPRAZolam (XANAX) 0.5 MG tablet Take 1/2 to 1 tablet 2 to 3 x /day ONLY as needed to rescue Panic / Anxiety attacks 60 tablet 0  . BIOTIN PO Take 10,000 mg by mouth daily.     . cephALEXin (KEFLEX) 500 MG capsule Take 1 capsule (500 mg total) by mouth 4 (four) times daily. For 10 days.  Take with food. 40 capsule 0  . Cholecalciferol (VITAMIN D PO) Take 5,000 Units by mouth daily.    . cyclobenzaprine (FLEXERIL) 10 MG tablet Take 0.5 tablets (5 mg total) by mouth 3 (three) times daily as needed for muscle spasms. 30 tablet 0  . divalproex (DEPAKOTE) 250 MG DR tablet Take 1 tablet daily or as directed for headaches and seizures 180 tablet 1  . escitalopram (LEXAPRO) 20 MG tablet TAKE 1/2 TO 1 (ONE-HALF TO ONE) TABLET BY MOUTH ONCE DAILY PREVENTION  OF  CHRONIC  ANXIETY  AND  PANIC  ATTACKS 90 tablet 1  . ezetimibe (ZETIA) 10  MG tablet TAKE 1 TABLET BY MOUTH ONCE DAILY FOR CHOLESTEROL 90 tablet 1  . famotidine (PEPCID) 40 MG tablet Take 40 mg by mouth daily.     . fluticasone (FLONASE) 50 MCG/ACT nasal spray Place 2 sprays into both nostrils daily as needed for allergies. 16 g 3  . metoCLOPramide (REGLAN) 10 MG tablet Take 1 tablet (10 mg total) by mouth every 6 (six) hours as needed for nausea (or headache). 30 tablet 0  . rosuvastatin (CRESTOR) 20 MG tablet TAKE 1 TABLET BY MOUTH ONCE DAILY  90 tablet 1  . SUMAtriptan (IMITREX) 20 MG/ACT nasal spray USE ONE DOSE IN THE NOSE NOW, MAY REPEAT IN 2 HOURS. MAX OF 2 DOSES IN 24 HOURS. 12 Inhaler 2  . topiramate (TOPAMAX) 100 MG tablet TAKE ONE TABLET BY MOUTH TWICE DAILY FOR  HEADACHE  PREVENTION 180 tablet 1   No facility-administered medications prior to visit.     PAST MEDICAL HISTORY: Past Medical History:  Diagnosis Date  . Allergy   . Anemia   . Anxiety   . Arthritis   . Depression   . Hyperlipidemia   . Hypertension   . Migraines   . Prediabetes   . RLS (restless legs syndrome)   . Vitamin D deficiency     PAST SURGICAL HISTORY: Past Surgical History:  Procedure Laterality Date  . APPENDECTOMY  1959  . CATARACT EXTRACTION, BILATERAL Bilateral 2014  . [place]  . KNEE ARTHROSCOPY Bilateral 04/2014   [facility]  . MOHS SURGERY Right 04/2013   Squamous cell cacinoma, neck/upper back  . SPINE SURGERY  2010   C6-C7 fusion    FAMILY HISTORY: Family History  Problem Relation Age of Onset  . Diabetes Mother   . Heart disease Mother   . Heart disease Father   . Stroke Father   . Hypertension Father   . Hyperlipidemia Sister   . Hypertension Brother     SOCIAL HISTORY:  Social History   Socioeconomic History  . Marital status: Married    Spouse name: Not on file  . Number of children: Not on file  . Years of education: Not on file  . Highest education level: Not on file  Occupational History  . Not on file  Social Needs  . Financial resource strain: Not on file  . Food insecurity:    Worry: Not on file    Inability: Not on file  . Transportation needs:    Medical: Not on file    Non-medical: Not on file  Tobacco Use  . Smoking status: Former Smoker    Packs/day: 1.00    Years: 4.00    Pack years: 4.00    Types: Cigarettes    Last attempt to quit: 03/25/1986    Years since quitting: 31.9  . Smokeless tobacco: Never Used  Substance and Sexual Activity  . Alcohol use:  Yes    Comment: occasional  . Drug use: No  . Sexual activity: Not on file  Lifestyle  . Physical activity:    Days per week: Not on file    Minutes per session: Not on file  . Stress: Not on file  Relationships  . Social connections:    Talks on phone: Not on file    Gets together: Not on file    Attends religious service: Not on file    Active member of club or organization: Not on file    Attends meetings of clubs or organizations:  Not on file    Relationship status: Not on file  . Intimate partner violence:    Fear of current or ex partner: Not on file    Emotionally abused: Not on file    Physically abused: Not on file    Forced sexual activity: Not on file  Other Topics Concern  . Not on file  Social History Narrative  . Not on file     PHYSICAL EXAM  GENERAL EXAM/CONSTITUTIONAL: Vitals:  Vitals:   03/05/18 0843  BP: 94/69  Pulse: 71  Weight: 152 lb 6.4 oz (69.1 kg)  Height: 5' 6.5" (1.689 m)   Body mass index is 24.23 kg/m. No exam data present  Patient is in no distress; well developed, nourished and groomed; neck is supple  CARDIOVASCULAR:  Examination of carotid arteries is normal; no carotid bruits  Regular rate and rhythm, no murmurs  Examination of peripheral vascular system by observation and palpation is normal  EYES:  Ophthalmoscopic exam of optic discs and posterior segments is normal; no papilledema or hemorrhages  MUSCULOSKELETAL:  Gait, strength, tone, movements noted in Neurologic exam below  NEUROLOGIC: MENTAL STATUS:  MMSE - [place] Exam 03/05/2018 02/27/2017 12/25/2016  Orientation to time 5 3 4   Orientation to Place 5 5 5   Registration 3 3 3   Attention/ Calculation 3 1 1   Recall 2 1 1   Language- name 2 objects 2 2 2   Language- repeat 1 1 1   Language- follow 3 step command 2 3 3   Language- read & follow direction 1 1 1   Write a sentence 1 1 1   Copy design 0 1 0  Total score 25 22 22     awake, alert,  oriented to person, place and time  [place] attention and concentration  language fluent, comprehension intact, naming intact   fund of knowledge appropriate  CRANIAL NERVE:   2nd - no papilledema on fundoscopic exam  2nd, 3rd, 4th, 6th - pupils equal and reactive to light, visual fields full to confrontation, extraocular muscles intact, no nystagmus  5th - facial sensation symmetric  7th - facial strength symmetric  8th - hearing intact  9th - palate elevates symmetrically, uvula midline  11th - shoulder shrug symmetric  12th - tongue protrusion midline  MOTOR:   normal bulk and tone, full strength in the BUE, BLE  SENSORY:   normal and symmetric to light touch, temperature, vibration    COORDINATION:   finger-nose-finger, fine finger movements normal  REFLEXES:   deep tendon reflexes present and symmetric  GAIT/STATION:   narrow based gait; romberg is negative    DIAGNOSTIC DATA (LABS, IMAGING, TESTING) - I reviewed patient records, labs, notes, testing and imaging myself where available.  Lab Results  Component Value Date   WBC 5.4 02/26/2018   HGB 12.3 02/26/2018   HCT 36.2 02/26/2018   MCV 90.3 02/26/2018   PLT 256 02/26/2018      Component Value Date/Time   NA 139 02/26/2018 1437   K 4.8 02/26/2018 1437   CL 106 02/26/2018 1437   CO2 25 02/26/2018 1437   GLUCOSE 97 02/26/2018 1437   BUN 8 02/26/2018 1437   CREATININE 0.76 02/26/2018 1437   CALCIUM 10.1 02/26/2018 1437   PROT 6.3 02/26/2018 1437   ALBUMIN 4.1 12/15/2016 1710   AST 16 02/26/2018 1437   ALT 9 02/26/2018 1437   ALKPHOS 63 12/15/2016 1710   BILITOT 0.4 02/26/2018 1437   GFRNONAA 79 02/26/2018  [place] 02/26/2018 1437   Lab Results  Component Value Date   CHOL 147 02/26/2018   HDL 60 02/26/2018   LDLCALC 69 02/26/2018   TRIG 95 02/26/2018   CHOLHDL 2.5 02/26/2018   Lab Results  Component Value Date   HGBA1C 5.6 02/26/2018   Lab  Results  Component Value Date   KNLZJQBH41 937 12/16/2016   Lab Results  Component Value Date   TSH 1.36 02/26/2018    12/16/16 MRI brain [I reviewed images myself and agree with interpretation. -VRP]  1. Diffuse peripheral supratentorial distribution of chronic microhemorrhage. The pattern is consistent with cerebral amyloid angiopathy and is atypical in distribution for chronic hypertensive encephalopathy. There is a single larger 5 mm focus of chronic hemorrhage within left lateral temporal lobe. 2. Moderate chronic microvascular ischemic changes and moderate parenchymal volume loss of the brain. 3. No acute intracranial abnormality identified.  01/02/17 EEG - Mildly abnormal EEG in the awake and drowsy states demonstrate mild diffuse slowing consistent with mild encephalopathy. This can be seen with toxic, metabolic or neurodegenerative etiologies.      ASSESSMENT AND PLAN  72 y.o. year old female here with new onset confusion, word finding difficulty, memory loss, on 12/15/16. This could have been related to seizure. Also with subacute to chronic confusion and short-term memory loss since 2017, may be related to underlying neurodegenerative process.     Dx:  1. Migraine without aura and without status migrainosus, not intractable   2. Seizures ([place])   3. Memory loss      PLAN:  CONFUSION SPELL (? seizure vs migraine vs metabolic) - continue divalproex 750mg  daily  MIGRAINE WITHOUT AURA (improved) - continue divalproex 750mg  daily - was on TPX in 2018, then stopped; but now restarted by PCP in Oct 2019  MEMORY LOSS (MCI vs mild dementia) - brain healthy activities reviewed  Return if symptoms worsen or fail to improve, for return to PCP.    Penni Bombard, MD 90/24/0973, 5:32 AM Certified in Neurology, Neurophysiology and Neuroimaging  [facility] [address] [place], [place] [address] [phone]

## 2018-03-11 ENCOUNTER — Encounter (HOSPITAL_COMMUNITY): Payer: Self-pay

## 2018-03-11 NOTE — Patient Instructions (Addendum)
Your procedure is scheduled on: Friday, Dec. 6, 2019   Surgery Time: 7:30AM-9:27AM   Report to [facility] Main  Entrance    Report to admitting at 5:30 AM   Call this number if you have problems the morning of surgery [phone]   Do not eat food or drink liquids :After Midnight.   Brush your teeth the morning of surgery.   Do NOT smoke after Midnight   Take these medicines the morning of surgery with A SIP OF WATER: Depakote, Escitalopram, Topiramate, Zetia, Famotidine, Rosuvastatin, Metoclopramide if needed, Xanax if needed   Flonase is needed                               You may not have any metal on your body including hair pins, jewelry, and body piercings             Do not wear make-up, lotions, powders, perfumes/cologne, or deodorant             Do not wear nail polish.  Do not shave  48 hours prior to surgery.               Do not bring valuables to the hospital. [place].   Contacts, dentures or bridgework may not be worn into surgery.   Leave suitcase in the car. After surgery it may be brought to your room.    Special Instructions: Bring a copy of your healthcare power of attorney and living will documents         the day of surgery if you haven't scanned them in before.              Please read over the following fact sheets you were given:  [facility] - Preparing for Surgery Before surgery, you can play an important role.  Because skin is not sterile, your skin needs to be as free of germs as possible.  You can reduce the number of germs on your skin by washing with CHG (chlorahexidine gluconate) soap before surgery.  CHG is an antiseptic cleaner which kills germs and bonds with the skin to continue killing germs even after washing. Please DO NOT use if you have an allergy to CHG or antibacterial soaps.  If your skin becomes reddened/irritated stop using the CHG and inform your nurse when you  arrive at Short Stay. Do not shave (including legs and underarms) for at least 48 hours prior to the first CHG shower.  You may shave your face/neck.  Please follow these instructions carefully:  1.  Shower with CHG Soap the night before surgery and the  morning of surgery.  2.  If you choose to wash your hair, wash your hair first as usual with your normal  shampoo.  3.  After you shampoo, rinse your hair and body thoroughly to remove the shampoo.                             4.  Use CHG as you would any other liquid soap.  You can apply chg directly to the skin and wash.  Gently with a scrungie or clean washcloth.  5.  Apply the CHG Soap to your body ONLY FROM THE NECK DOWN.   Do  not use on face/ open                           Wound or open sores. Avoid contact with eyes, ears mouth and   genitals (private parts).                       Wash face,  Genitals (private parts) with your normal soap.             6.  Wash thoroughly, paying special attention to the area where your    surgery  will be performed.  7.  Thoroughly rinse your body with warm water from the neck down.  8.  DO NOT shower/wash with your normal soap after using and rinsing off the CHG Soap.                9.  Pat yourself dry with a clean towel.            10.  Wear clean pajamas.            11.  Place clean sheets on your bed the night of your first shower and do not  sleep with pets. Day of Surgery : Do not apply any lotions/deodorants the morning of surgery.  Please wear clean clothes to the hospital/surgery center.  FAILURE TO FOLLOW THESE INSTRUCTIONS MAY RESULT IN THE CANCELLATION OF YOUR SURGERY  PATIENT SIGNATURE_________________________________  NURSE SIGNATURE__________________________________  ________________________________________________________________________   Deborah [name]  An incentive spirometer is a tool that can help keep your lungs clear and active. This tool measures how well you are  filling your lungs with each breath. Taking long deep breaths may help reverse or decrease the chance of developing breathing (pulmonary) problems (especially infection) following:  A long period of time when you are unable to move or be active. BEFORE THE PROCEDURE   If the spirometer includes an indicator to show your best effort, your nurse or respiratory therapist will set it to a desired goal.  If possible, sit up straight or lean slightly forward. Try not to slouch.  Hold the incentive spirometer in an upright position. INSTRUCTIONS FOR USE  1. Sit on the edge of your bed if possible, or sit up as far as you can in bed or on a chair. 2. Hold the incentive spirometer in an upright position. 3. Breathe out normally. 4. Place the mouthpiece in your mouth and seal your lips tightly around it. 5. Breathe in slowly and as deeply as possible, raising the piston or the ball toward the top of the column. 6. Hold your breath for 3-5 seconds or for as long as possible. Allow the piston or ball to fall to the bottom of the column. 7. Remove the mouthpiece from your mouth and breathe out normally. 8. Rest for a few seconds and repeat Steps 1 through 7 at least 10 times every 1-2 hours when you are awake. Take your time and take a few normal breaths between deep breaths. 9. The spirometer may include an indicator to show your best effort. Use the indicator as a goal to work toward during each repetition. 10. After each set of 10 deep breaths, practice coughing to be sure your lungs are clear. If you have an incision (the cut made at the time of surgery), support your incision when coughing by placing a pillow or rolled up towels firmly against it. Once you are able  to get out of bed, walk around indoors and cough well. You may stop using the incentive spirometer when instructed by your caregiver.  RISKS AND COMPLICATIONS  Take your time so you do not get dizzy or light-headed.  If you are in pain,  you may need to take or ask for pain medication before doing incentive spirometry. It is harder to take a deep breath if you are having pain. AFTER USE  Rest and breathe slowly and easily.  It can be helpful to keep track of a log of your progress. Your caregiver can provide you with a simple table to help with this. If you are using the spirometer at home, follow these instructions: [place] IF:   You are having difficultly using the spirometer.  You have trouble using the spirometer as often as instructed.  Your pain medication is not giving enough relief while using the spirometer.  You develop fever of 100.5 F (38.1 C) or higher. SEEK IMMEDIATE MEDICAL CARE IF:   You cough up bloody sputum that had not been present before.  You develop fever of 102 F (38.9 C) or greater.  You develop worsening pain at or near the incision site. MAKE SURE YOU:   Understand these instructions.  Will watch your condition.  Will get help right away if you are not doing well or get worse. Document Released: 08/14/2006 Document Revised: 06/26/2011 Document Reviewed: 10/15/2006 [facility] Patient Information 2014 [place], [place].   ________________________________________________________________________

## 2018-03-11 NOTE — Pre-Procedure Instructions (Signed)
The following are in epic: EKG 05/08/2017 Hgb A1C (5.6) CBC/diff, and CMP 02/26/2018

## 2018-03-12 ENCOUNTER — Ambulatory Visit (HOSPITAL_COMMUNITY)
Admission: RE | Admit: 2018-03-12 | Discharge: 2018-03-12 | Disposition: A | Discharge status: Home / Self Care | Payer: Medicare Other | Source: Ambulatory Visit | Attending: Orthopedic Surgery | Admitting: Orthopedic Surgery

## 2018-03-12 ENCOUNTER — Other Ambulatory Visit: Payer: Self-pay

## 2018-03-12 ENCOUNTER — Encounter (HOSPITAL_COMMUNITY): Payer: Self-pay

## 2018-03-12 ENCOUNTER — Encounter (HOSPITAL_COMMUNITY)
Admission: RE | Admit: 2018-03-12 | Discharge: 2018-03-12 | Disposition: A | Discharge status: Home / Self Care | Payer: Medicare Other | Source: Ambulatory Visit | Attending: Orthopedic Surgery | Admitting: Orthopedic Surgery

## 2018-03-12 DIAGNOSIS — Z01818 Encounter for other preprocedural examination: Secondary | ICD-10-CM | POA: Insufficient documentation

## 2018-03-12 DIAGNOSIS — M1712 Unilateral primary osteoarthritis, left knee: Secondary | ICD-10-CM | POA: Diagnosis not present

## 2018-03-12 HISTORY — DX: Prediabetes: R73.03

## 2018-03-12 HISTORY — DX: Transient cerebral ischemic attack, unspecified: G45.9

## 2018-03-12 HISTORY — DX: Cerebral amyloid angiopathy: I68.0

## 2018-03-12 HISTORY — DX: Unspecified convulsions: R56.9

## 2018-03-12 HISTORY — DX: Other specified postprocedural states: Z98.890

## 2018-03-12 HISTORY — DX: Squamous cell carcinoma of skin, unspecified: C44.92

## 2018-03-12 HISTORY — DX: Atherosclerosis of aorta: I70.0

## 2018-03-12 HISTORY — DX: Gastro-esophageal reflux disease without esophagitis: K21.9

## 2018-03-12 HISTORY — DX: Personal history of other specified conditions: Z87.898

## 2018-03-12 HISTORY — DX: Wedge compression fracture of t11-T12 vertebra, initial encounter for closed fracture: S22.080A

## 2018-03-12 HISTORY — DX: Unspecified cataract: H26.9

## 2018-03-12 HISTORY — DX: Personal history of colonic polyps: Z86.010

## 2018-03-12 HISTORY — DX: Other specified bacterial intestinal infections: A04.8

## 2018-03-12 HISTORY — DX: Cellulitis, unspecified: L03.90

## 2018-03-12 HISTORY — DX: Nausea with vomiting, unspecified: R11.2

## 2018-03-12 HISTORY — DX: Encephalopathy, unspecified: G93.40

## 2018-03-12 HISTORY — DX: Unspecified malignant neoplasm of skin, unspecified: C44.90

## 2018-03-12 HISTORY — DX: Other hemorrhoids: K64.8

## 2018-03-12 HISTORY — DX: Personal history of colon polyps, unspecified: Z86.0100

## 2018-03-12 HISTORY — DX: Other cervical disc degeneration, unspecified cervical region: M50.30

## 2018-03-12 HISTORY — DX: Spondylosis without myelopathy or radiculopathy, cervical region: M47.812

## 2018-03-12 HISTORY — DX: Other specified disorders of bone density and structure, unspecified site: M85.80

## 2018-03-12 HISTORY — DX: Nonspecific low blood-pressure reading: R03.1

## 2018-03-12 HISTORY — DX: Personal history of other diseases of the digestive system: Z87.19

## 2018-03-12 HISTORY — DX: Nausea: R11.0

## 2018-03-12 LAB — URINALYSIS, ROUTINE W REFLEX MICROSCOPIC
BILIRUBIN URINE: NEGATIVE
GLUCOSE, UA: NEGATIVE mg/dL
HGB URINE DIPSTICK: NEGATIVE
Ketones, ur: NEGATIVE mg/dL
Leukocytes, UA: NEGATIVE
Nitrite: NEGATIVE
PH: 7 (ref 5.0–8.0)
Protein, ur: NEGATIVE mg/dL
SPECIFIC GRAVITY, URINE: 1.009 (ref 1.005–1.030)

## 2018-03-12 LAB — SURGICAL PCR SCREEN
MRSA, PCR: NEGATIVE
Staphylococcus aureus: NEGATIVE

## 2018-03-12 LAB — APTT: APTT: 28 s (ref 24–36)

## 2018-03-12 LAB — PROTIME-INR
INR: 0.96
PROTHROMBIN TIME: 12.7 s (ref 11.4–15.2)

## 2018-03-12 LAB — ABO/RH: ABO/RH(D): A POS

## 2018-03-12 NOTE — Pre-Procedure Instructions (Signed)
UA results 03/12/2018 sent to Dr. Berenice Primas via epic.

## 2018-03-21 MED ORDER — BUPIVACAINE LIPOSOME 1.3 % IJ SUSP
20.0000 mL | Freq: Once | INTRAMUSCULAR | Status: DC
  Filled 2018-03-21: qty 20

## 2018-03-21 NOTE — Anesthesia Preprocedure Evaluation (Addendum)
Anesthesia Evaluation  Patient identified by MRN, date of birth, ID band Patient awake    Reviewed: Allergy & Precautions, H&P , NPO status , Patient's Chart, lab work & pertinent test results, reviewed documented beta blocker date and time   History of Anesthesia Complications (+) PONV and history of anesthetic complications  Airway Mallampati: II  TM Distance: >3 FB Neck ROM: full    Dental no notable dental hx.    Pulmonary neg pulmonary ROS, former smoker,    Pulmonary exam normal breath sounds clear to auscultation       Cardiovascular Exercise Tolerance: Good hypertension, negative cardio ROS   Rhythm:regular Rate:Normal     Neuro/Psych  Headaches, Seizures -,  PSYCHIATRIC DISORDERS Anxiety Depression TIAnegative neurological ROS  negative psych ROS   GI/Hepatic negative GI ROS, Neg liver ROS, hiatal hernia, GERD  Medicated,  Endo/Other  negative endocrine ROS  Renal/GU negative Renal ROS  negative genitourinary   Musculoskeletal  (+) Arthritis ,   Abdominal   Peds  Hematology negative hematology ROS (+)   Anesthesia Other Findings   Reproductive/Obstetrics negative OB ROS                            Anesthesia Physical Anesthesia Plan  ASA: II  Anesthesia Plan: Spinal   Post-op Pain Management:  [facility] for Post-op pain   Induction:   PONV Risk Score and Plan: 3 and Ondansetron, Treatment may vary due to age or medical condition and Dexamethasone  Airway Management Planned: Nasal Cannula, Natural Airway and Mask  Additional Equipment:   Intra-op Plan:   Post-operative Plan:   Informed Consent: I have reviewed the patients History and Physical, chart, labs and discussed the procedure including the risks, benefits and alternatives for the proposed anesthesia with the patient or authorized representative who has indicated his/her understanding and acceptance.   Dental  Advisory Given  Plan Discussed with: CRNA, Anesthesiologist and Surgeon  Anesthesia Plan Comments: (  )       Anesthesia Quick Evaluation

## 2018-03-22 ENCOUNTER — Ambulatory Visit (HOSPITAL_COMMUNITY): Payer: Medicare Other | Admitting: Anesthesiology

## 2018-03-22 ENCOUNTER — Encounter (HOSPITAL_COMMUNITY): Payer: Self-pay | Admitting: *Deleted

## 2018-03-22 ENCOUNTER — Encounter (HOSPITAL_COMMUNITY)
Admission: RE | Disposition: A | Discharge status: Home Health | Payer: Self-pay | Source: Ambulatory Visit | Attending: Orthopedic Surgery

## 2018-03-22 ENCOUNTER — Ambulatory Visit (HOSPITAL_COMMUNITY)
Admission: RE | Admit: 2018-03-22 | Discharge: 2018-03-24 | Disposition: A | Discharge status: Home Health | Payer: Medicare Other | Source: Ambulatory Visit | Attending: Orthopedic Surgery | Admitting: Orthopedic Surgery

## 2018-03-22 ENCOUNTER — Other Ambulatory Visit: Payer: Self-pay

## 2018-03-22 DIAGNOSIS — I10 Essential (primary) hypertension: Secondary | ICD-10-CM | POA: Insufficient documentation

## 2018-03-22 DIAGNOSIS — R7303 Prediabetes: Secondary | ICD-10-CM | POA: Diagnosis not present

## 2018-03-22 DIAGNOSIS — Z8673 Personal history of transient ischemic attack (TIA), and cerebral infarction without residual deficits: Secondary | ICD-10-CM | POA: Diagnosis not present

## 2018-03-22 DIAGNOSIS — F22 Delusional disorders: Secondary | ICD-10-CM | POA: Diagnosis present

## 2018-03-22 DIAGNOSIS — R001 Bradycardia, unspecified: Secondary | ICD-10-CM | POA: Diagnosis not present

## 2018-03-22 DIAGNOSIS — M1712 Unilateral primary osteoarthritis, left knee: Secondary | ICD-10-CM | POA: Diagnosis not present

## 2018-03-22 DIAGNOSIS — Z87891 Personal history of nicotine dependence: Secondary | ICD-10-CM | POA: Diagnosis not present

## 2018-03-22 DIAGNOSIS — Z79899 Other long term (current) drug therapy: Secondary | ICD-10-CM | POA: Diagnosis not present

## 2018-03-22 DIAGNOSIS — G40909 Epilepsy, unspecified, not intractable, without status epilepticus: Secondary | ICD-10-CM | POA: Diagnosis not present

## 2018-03-22 DIAGNOSIS — F329 Major depressive disorder, single episode, unspecified: Secondary | ICD-10-CM | POA: Insufficient documentation

## 2018-03-22 HISTORY — PX: TOTAL KNEE ARTHROPLASTY: SHX125

## 2018-03-22 LAB — GLUCOSE, CAPILLARY: GLUCOSE-CAPILLARY: 177 mg/dL — AB (ref 70–99)

## 2018-03-22 SURGERY — ARTHROPLASTY, KNEE, TOTAL
Anesthesia: Spinal | Site: Knee | Laterality: Left

## 2018-03-22 MED ORDER — OXYCODONE-ACETAMINOPHEN 5-325 MG PO TABS: 1.0000 | ORAL_TABLET | Freq: Four times a day (QID) | ORAL | 0 refills | Status: DC | PRN

## 2018-03-22 MED ORDER — SODIUM CHLORIDE 0.9 % IV SOLN
INTRAVENOUS | Status: DC | PRN
  Administered 2018-03-22: 25 ug/min via INTRAVENOUS

## 2018-03-22 MED ORDER — MAGNESIUM CITRATE PO SOLN: 1.0000 | Freq: Once | ORAL | Status: DC | PRN

## 2018-03-22 MED ORDER — CHLORHEXIDINE GLUCONATE 4 % EX LIQD: 60.0000 mL | Freq: Once | CUTANEOUS | Status: DC

## 2018-03-22 MED ORDER — DOCUSATE SODIUM 100 MG PO CAPS: 100.0000 mg | ORAL_CAPSULE | Freq: Two times a day (BID) | ORAL | 0 refills | Status: DC

## 2018-03-22 MED ORDER — SODIUM CHLORIDE 0.9 % IV SOLN
INTRAVENOUS | Status: DC
  Administered 2018-03-22 – 2018-03-23 (×3): via INTRAVENOUS

## 2018-03-22 MED ORDER — HYDROMORPHONE HCL 1 MG/ML IJ SOLN: 0.5000 mg | INTRAMUSCULAR | Status: DC | PRN

## 2018-03-22 MED ORDER — METHOCARBAMOL 500 MG PO TABS
500.0000 mg | ORAL_TABLET | Freq: Four times a day (QID) | ORAL | Status: DC | PRN
  Administered 2018-03-22 – 2018-03-23 (×3): 500 mg via ORAL
  Filled 2018-03-22 (×3): qty 1

## 2018-03-22 MED ORDER — BUPIVACAINE LIPOSOME 1.3 % IJ SUSP
INTRAMUSCULAR | Status: DC | PRN
  Administered 2018-03-22: 20 mL

## 2018-03-22 MED ORDER — FAMOTIDINE 20 MG PO TABS
40.0000 mg | ORAL_TABLET | Freq: Every day | ORAL | Status: DC
  Administered 2018-03-23 – 2018-03-24 (×2): 40 mg via ORAL
  Filled 2018-03-22 (×2): qty 2

## 2018-03-22 MED ORDER — TRANEXAMIC ACID-NACL 1000-0.7 MG/100ML-% IV SOLN
1000.0000 mg | Freq: Once | INTRAVENOUS | Status: AC
  Administered 2018-03-22: 1000 mg via INTRAVENOUS
  Filled 2018-03-22: qty 100

## 2018-03-22 MED ORDER — MEPERIDINE HCL 50 MG/ML IJ SOLN: 6.2500 mg | INTRAMUSCULAR | Status: DC | PRN

## 2018-03-22 MED ORDER — ROPIVACAINE HCL 7.5 MG/ML IJ SOLN
INTRAMUSCULAR | Status: DC | PRN
  Administered 2018-03-22: 25 mL via PERINEURAL

## 2018-03-22 MED ORDER — FENTANYL CITRATE (PF) 100 MCG/2ML IJ SOLN
INTRAMUSCULAR | Status: DC | PRN
  Administered 2018-03-22: 50 ug via INTRAVENOUS

## 2018-03-22 MED ORDER — TIZANIDINE HCL 2 MG PO TABS: 2.0000 mg | ORAL_TABLET | Freq: Three times a day (TID) | ORAL | 0 refills | Status: DC | PRN

## 2018-03-22 MED ORDER — EZETIMIBE 10 MG PO TABS
10.0000 mg | ORAL_TABLET | Freq: Every day | ORAL | Status: DC
  Administered 2018-03-22 – 2018-03-24 (×3): 10 mg via ORAL
  Filled 2018-03-22 (×3): qty 1

## 2018-03-22 MED ORDER — DOCUSATE SODIUM 100 MG PO CAPS
100.0000 mg | ORAL_CAPSULE | Freq: Two times a day (BID) | ORAL | Status: DC
  Administered 2018-03-22 – 2018-03-24 (×4): 100 mg via ORAL
  Filled 2018-03-22 (×4): qty 1

## 2018-03-22 MED ORDER — ASPIRIN EC 325 MG PO TBEC
325.0000 mg | DELAYED_RELEASE_TABLET | Freq: Two times a day (BID) | ORAL | Status: DC
  Administered 2018-03-22 – 2018-03-24 (×4): 325 mg via ORAL
  Filled 2018-03-22 (×4): qty 1

## 2018-03-22 MED ORDER — PHENYLEPHRINE HCL 10 MG/ML IJ SOLN
INTRAMUSCULAR | Status: DC | PRN
  Administered 2018-03-22: 120 ug via INTRAVENOUS

## 2018-03-22 MED ORDER — LACTATED RINGERS IV SOLN
INTRAVENOUS | Status: DC | PRN
  Administered 2018-03-22 (×2): via INTRAVENOUS

## 2018-03-22 MED ORDER — PROPOFOL 500 MG/50ML IV EMUL
INTRAVENOUS | Status: DC | PRN
  Administered 2018-03-22: 50 ug/kg/min via INTRAVENOUS

## 2018-03-22 MED ORDER — CELECOXIB 200 MG PO CAPS
200.0000 mg | ORAL_CAPSULE | Freq: Two times a day (BID) | ORAL | Status: DC
  Administered 2018-03-22 – 2018-03-24 (×5): 200 mg via ORAL
  Filled 2018-03-22 (×5): qty 1

## 2018-03-22 MED ORDER — FENTANYL CITRATE (PF) 100 MCG/2ML IJ SOLN: 25.0000 ug | INTRAMUSCULAR | Status: DC | PRN

## 2018-03-22 MED ORDER — ALUM & MAG HYDROXIDE-SIMETH 200-200-20 MG/5ML PO SUSP: 30.0000 mL | ORAL | Status: DC | PRN

## 2018-03-22 MED ORDER — DIVALPROEX SODIUM 250 MG PO DR TAB
250.0000 mg | DELAYED_RELEASE_TABLET | Freq: Every day | ORAL | Status: DC
  Administered 2018-03-23 – 2018-03-24 (×2): 250 mg via ORAL
  Filled 2018-03-22 (×2): qty 1

## 2018-03-22 MED ORDER — WATER FOR IRRIGATION, STERILE IR SOLN
Status: DC | PRN
  Administered 2018-03-22: 2000 mL

## 2018-03-22 MED ORDER — ONDANSETRON HCL 4 MG/2ML IJ SOLN
INTRAMUSCULAR | Status: DC | PRN
  Administered 2018-03-22: 4 mg via INTRAVENOUS

## 2018-03-22 MED ORDER — SODIUM CHLORIDE 0.9% FLUSH
INTRAVENOUS | Status: DC | PRN
  Administered 2018-03-22: 50 mL

## 2018-03-22 MED ORDER — ALPRAZOLAM 0.25 MG PO TABS
0.2500 mg | ORAL_TABLET | Freq: Three times a day (TID) | ORAL | Status: DC | PRN
  Administered 2018-03-22: 0.5 mg via ORAL
  Filled 2018-03-22: qty 2

## 2018-03-22 MED ORDER — ONDANSETRON HCL 4 MG/2ML IJ SOLN
INTRAMUSCULAR | Status: AC
  Filled 2018-03-22: qty 2

## 2018-03-22 MED ORDER — SODIUM CHLORIDE 0.9 % IR SOLN
Status: DC | PRN
  Administered 2018-03-22: 2000 mL

## 2018-03-22 MED ORDER — CEFAZOLIN SODIUM-DEXTROSE 2-4 GM/100ML-% IV SOLN
2.0000 g | Freq: Four times a day (QID) | INTRAVENOUS | Status: AC
  Administered 2018-03-22 (×2): 2 g via INTRAVENOUS
  Filled 2018-03-22 (×2): qty 100

## 2018-03-22 MED ORDER — ONDANSETRON HCL 4 MG PO TABS: 4.0000 mg | ORAL_TABLET | Freq: Four times a day (QID) | ORAL | Status: DC | PRN

## 2018-03-22 MED ORDER — BISACODYL 5 MG PO TBEC: 5.0000 mg | DELAYED_RELEASE_TABLET | Freq: Every day | ORAL | Status: DC | PRN

## 2018-03-22 MED ORDER — SODIUM CHLORIDE (PF) 0.9 % IJ SOLN
INTRAMUSCULAR | Status: AC
  Filled 2018-03-22: qty 50

## 2018-03-22 MED ORDER — METHOCARBAMOL 500 MG IVPB - SIMPLE MED
500.0000 mg | Freq: Four times a day (QID) | INTRAVENOUS | Status: DC | PRN
  Filled 2018-03-22: qty 50

## 2018-03-22 MED ORDER — POLYETHYLENE GLYCOL 3350 17 G PO PACK: 17.0000 g | PACK | Freq: Every day | ORAL | Status: DC | PRN

## 2018-03-22 MED ORDER — CEFAZOLIN SODIUM-DEXTROSE 2-4 GM/100ML-% IV SOLN
INTRAVENOUS | Status: AC
  Filled 2018-03-22: qty 100

## 2018-03-22 MED ORDER — BUPIVACAINE IN DEXTROSE 0.75-8.25 % IT SOLN
INTRATHECAL | Status: DC | PRN
  Administered 2018-03-22: 1.6 mL via INTRATHECAL

## 2018-03-22 MED ORDER — SODIUM CHLORIDE 0.9 % IR SOLN
Status: DC | PRN
  Administered 2018-03-22: 1000 mL

## 2018-03-22 MED ORDER — VANCOMYCIN HCL IN DEXTROSE 1-5 GM/200ML-% IV SOLN
1000.0000 mg | INTRAVENOUS | Status: DC
  Filled 2018-03-22: qty 200

## 2018-03-22 MED ORDER — DEXAMETHASONE SODIUM PHOSPHATE 10 MG/ML IJ SOLN
10.0000 mg | Freq: Two times a day (BID) | INTRAMUSCULAR | Status: AC
  Administered 2018-03-22 – 2018-03-23 (×3): 10 mg via INTRAVENOUS
  Filled 2018-03-22 (×3): qty 1

## 2018-03-22 MED ORDER — BUPIVACAINE-EPINEPHRINE (PF) 0.25% -1:200000 IJ SOLN
INTRAMUSCULAR | Status: AC
  Filled 2018-03-22: qty 30

## 2018-03-22 MED ORDER — ESCITALOPRAM OXALATE 10 MG PO TABS
10.0000 mg | ORAL_TABLET | Freq: Every day | ORAL | Status: DC
  Administered 2018-03-23 – 2018-03-24 (×2): 10 mg via ORAL
  Filled 2018-03-22 (×2): qty 1

## 2018-03-22 MED ORDER — KETOROLAC TROMETHAMINE 30 MG/ML IJ SOLN
INTRAMUSCULAR | Status: AC
  Filled 2018-03-22: qty 1

## 2018-03-22 MED ORDER — MIDAZOLAM HCL 2 MG/2ML IJ SOLN
INTRAMUSCULAR | Status: AC
  Filled 2018-03-22: qty 2

## 2018-03-22 MED ORDER — ACETAMINOPHEN 325 MG PO TABS
325.0000 mg | ORAL_TABLET | Freq: Four times a day (QID) | ORAL | Status: DC | PRN
  Administered 2018-03-22 – 2018-03-24 (×3): 650 mg via ORAL
  Filled 2018-03-22 (×3): qty 2

## 2018-03-22 MED ORDER — FENTANYL CITRATE (PF) 100 MCG/2ML IJ SOLN
INTRAMUSCULAR | Status: AC
  Filled 2018-03-22: qty 2

## 2018-03-22 MED ORDER — DEXAMETHASONE SODIUM PHOSPHATE 10 MG/ML IJ SOLN
INTRAMUSCULAR | Status: AC
  Filled 2018-03-22: qty 1

## 2018-03-22 MED ORDER — ROSUVASTATIN CALCIUM 20 MG PO TABS
20.0000 mg | ORAL_TABLET | Freq: Every day | ORAL | Status: DC
  Administered 2018-03-23 – 2018-03-24 (×2): 20 mg via ORAL
  Filled 2018-03-22 (×2): qty 1

## 2018-03-22 MED ORDER — CLONIDINE HCL (ANALGESIA) 100 MCG/ML EP SOLN
EPIDURAL | Status: DC | PRN
  Administered 2018-03-22: 100 ug

## 2018-03-22 MED ORDER — ASPIRIN EC 325 MG PO TBEC: 325.0000 mg | DELAYED_RELEASE_TABLET | Freq: Two times a day (BID) | ORAL | 0 refills | Status: DC

## 2018-03-22 MED ORDER — DIPHENHYDRAMINE HCL 12.5 MG/5ML PO ELIX: 12.5000 mg | ORAL_SOLUTION | ORAL | Status: DC | PRN

## 2018-03-22 MED ORDER — MIDAZOLAM HCL 5 MG/5ML IJ SOLN
INTRAMUSCULAR | Status: DC | PRN
  Administered 2018-03-22: 1 mg via INTRAVENOUS

## 2018-03-22 MED ORDER — TOPIRAMATE 100 MG PO TABS
100.0000 mg | ORAL_TABLET | Freq: Two times a day (BID) | ORAL | Status: DC
  Administered 2018-03-22 – 2018-03-24 (×4): 100 mg via ORAL
  Filled 2018-03-22 (×4): qty 1

## 2018-03-22 MED ORDER — BUPIVACAINE-EPINEPHRINE (PF) 0.25% -1:200000 IJ SOLN
INTRAMUSCULAR | Status: DC | PRN
  Administered 2018-03-22: 30 mL

## 2018-03-22 MED ORDER — TRANEXAMIC ACID-NACL 1000-0.7 MG/100ML-% IV SOLN
1000.0000 mg | INTRAVENOUS | Status: AC
  Administered 2018-03-22: 1000 mg via INTRAVENOUS
  Filled 2018-03-22: qty 100

## 2018-03-22 MED ORDER — CEFAZOLIN SODIUM-DEXTROSE 2-4 GM/100ML-% IV SOLN
2.0000 g | Freq: Once | INTRAVENOUS | Status: AC
  Administered 2018-03-22: 2 g via INTRAVENOUS

## 2018-03-22 MED ORDER — PROPOFOL 10 MG/ML IV BOLUS
INTRAVENOUS | Status: DC | PRN
  Administered 2018-03-22: 20 mg via INTRAVENOUS

## 2018-03-22 MED ORDER — OXYCODONE HCL 5 MG PO TABS
5.0000 mg | ORAL_TABLET | ORAL | Status: DC | PRN
  Administered 2018-03-22: 5 mg via ORAL
  Administered 2018-03-22 – 2018-03-23 (×2): 10 mg via ORAL
  Filled 2018-03-22: qty 2
  Filled 2018-03-22: qty 1
  Filled 2018-03-22: qty 2

## 2018-03-22 MED ORDER — ONDANSETRON HCL 4 MG/2ML IJ SOLN: 4.0000 mg | Freq: Four times a day (QID) | INTRAMUSCULAR | Status: DC | PRN

## 2018-03-22 MED ORDER — GABAPENTIN 300 MG PO CAPS
300.0000 mg | ORAL_CAPSULE | Freq: Two times a day (BID) | ORAL | Status: DC
  Administered 2018-03-22 – 2018-03-24 (×5): 300 mg via ORAL
  Filled 2018-03-22 (×5): qty 1

## 2018-03-22 MED ORDER — PROPOFOL 10 MG/ML IV BOLUS
INTRAVENOUS | Status: AC
  Filled 2018-03-22: qty 60

## 2018-03-22 SURGICAL SUPPLY — 66 items
APL SKNCLS STERI-STRIP NONHPOA (GAUZE/BANDAGES/DRESSINGS) ×1
ATTUNE MED DOME PAT 38 KNEE (Knees) ×1 IMPLANT
ATTUNE PS FEM LT SZ 5 CEM KNEE (Femur) ×1 IMPLANT
ATTUNE PSRP INSR SZ5 6 KNEE (Insert) ×1 IMPLANT
BAG SPEC THK2 15X12 ZIP CLS (MISCELLANEOUS) ×1
BAG ZIPLOCK 12X15 (MISCELLANEOUS) ×2 IMPLANT
BANDAGE ACE 6X5 VEL STRL LF (GAUZE/BANDAGES/DRESSINGS) ×2 IMPLANT
BASE TIBIAL ROT PLAT SZ 5 KNEE (Knees) IMPLANT
BENZOIN TINCTURE PRP APPL 2/3 (GAUZE/BANDAGES/DRESSINGS) ×2 IMPLANT
BLADE SAGITTAL 25.0X1.19X90 (BLADE) ×2 IMPLANT
BLADE SAW SGTL 11.0X1.19X90.0M (BLADE) ×2 IMPLANT
BLADE SURG SZ10 CARB STEEL (BLADE) ×4 IMPLANT
BOOTIES KNEE HIGH SLOAN (MISCELLANEOUS) ×2 IMPLANT
BOWL SMART MIX CTS (DISPOSABLE) ×2 IMPLANT
BSPLAT TIB 5 CMNT ROT PLAT STR (Knees) ×1 IMPLANT
CEMENT HV SMART SET (Cement) ×4 IMPLANT
COVER WAND RF STERILE (DRAPES) ×1 IMPLANT
CUFF TOURN SGL QUICK 34 (TOURNIQUET CUFF) ×2
CUFF TRNQT CYL 34X4X40X1 (TOURNIQUET CUFF) ×1 IMPLANT
DECANTER SPIKE VIAL GLASS SM (MISCELLANEOUS) ×1 IMPLANT
DRAPE U-SHAPE 47X51 STRL (DRAPES) ×2 IMPLANT
DRESSING AQUACEL AG SP 3.5X10 (GAUZE/BANDAGES/DRESSINGS) IMPLANT
DRSG AQUACEL AG ADV 3.5X10 (GAUZE/BANDAGES/DRESSINGS) ×2 IMPLANT
DRSG AQUACEL AG SP 3.5X10 (GAUZE/BANDAGES/DRESSINGS) ×2
DURAPREP 26ML APPLICATOR (WOUND CARE) ×2 IMPLANT
ELECT REM PT RETURN 15FT ADLT (MISCELLANEOUS) ×2 IMPLANT
GLOVE BIOGEL PI IND STRL 7.0 (GLOVE) IMPLANT
GLOVE BIOGEL PI IND STRL 7.5 (GLOVE) IMPLANT
GLOVE BIOGEL PI IND STRL 8 (GLOVE) ×2 IMPLANT
GLOVE BIOGEL PI IND STRL 8.5 (GLOVE) IMPLANT
GLOVE BIOGEL PI IND STRL 9 (GLOVE) IMPLANT
GLOVE BIOGEL PI INDICATOR 7.0 (GLOVE) ×2
GLOVE BIOGEL PI INDICATOR 7.5 (GLOVE) ×2
GLOVE BIOGEL PI INDICATOR 8 (GLOVE) ×2
GLOVE BIOGEL PI INDICATOR 8.5 (GLOVE) ×1
GLOVE BIOGEL PI INDICATOR 9 (GLOVE) ×1
GLOVE ECLIPSE 7.5 STRL STRAW (GLOVE) ×4 IMPLANT
GLOVE SURG ORTHO 8.5 STRL (GLOVE) ×1 IMPLANT
GOWN SPEC L3 XXLG W/TWL (GOWN DISPOSABLE) ×1 IMPLANT
GOWN STRL REUS W/TWL LRG LVL3 (GOWN DISPOSABLE) ×1 IMPLANT
GOWN STRL REUS W/TWL XL LVL3 (GOWN DISPOSABLE) ×4 IMPLANT
HANDPIECE INTERPULSE COAX TIP (DISPOSABLE) ×2
HOLDER FOLEY CATH W/STRAP (MISCELLANEOUS) ×1 IMPLANT
HOOD PEEL AWAY FLYTE STAYCOOL (MISCELLANEOUS) ×6 IMPLANT
IMMOBILIZER KNEE 20 (SOFTGOODS) ×2
IMMOBILIZER KNEE 20 THIGH 36 (SOFTGOODS) ×1 IMPLANT
MANIFOLD NEPTUNE II (INSTRUMENTS) ×2 IMPLANT
NEEDLE HYPO 22GX1.5 SAFETY (NEEDLE) ×2 IMPLANT
PACK ICE MAXI GEL EZY WRAP (MISCELLANEOUS) ×2 IMPLANT
PACK TOTAL KNEE CUSTOM (KITS) ×2 IMPLANT
PADDING CAST COTTON 6X4 STRL (CAST SUPPLIES) ×2 IMPLANT
PIN STEINMAN FIXATION KNEE (PIN) ×1 IMPLANT
PROTECTOR NERVE ULNAR (MISCELLANEOUS) ×2 IMPLANT
SET HNDPC FAN SPRY TIP SCT (DISPOSABLE) ×1 IMPLANT
STAPLER VISISTAT 35W (STAPLE) IMPLANT
STRIP CLOSURE SKIN 1/2X4 (GAUZE/BANDAGES/DRESSINGS) ×1 IMPLANT
SUT MNCRL AB 3-0 PS2 18 (SUTURE) ×2 IMPLANT
SUT VIC AB 0 CT1 36 (SUTURE) ×2 IMPLANT
SUT VIC AB 1 CT1 36 (SUTURE) ×4 IMPLANT
SUT VIC AB 2-0 CT1 27 (SUTURE) ×4
SUT VIC AB 2-0 CT1 TAPERPNT 27 (SUTURE) ×2 IMPLANT
SYR CONTROL 10ML LL (SYRINGE) ×4 IMPLANT
TIBIAL BASE ROT PLAT SZ 5 KNEE (Knees) ×2 IMPLANT
TRAY FOLEY MTR SLVR 16FR STAT (SET/KITS/TRAYS/PACK) ×2 IMPLANT
WRAP KNEE MAXI GEL POST OP (GAUZE/BANDAGES/DRESSINGS) ×1 IMPLANT
YANKAUER SUCT BULB TIP 10FT TU (MISCELLANEOUS) ×2 IMPLANT

## 2018-03-22 NOTE — H&P (Signed)
TOTAL KNEE ADMISSION H&P  Patient is being admitted for left total knee arthroplasty.  Subjective:  Chief Complaint:left knee pain.  HPI: Deborah [name], 72 y.o. female, has a history of pain and functional disability in the left knee due to arthritis and has failed non-surgical conservative treatments for greater than 12 weeks to includeNSAID's and/or analgesics, corticosteriod injections, viscosupplementation injections and activity modification.  Onset of symptoms was gradual, starting 5 years ago with gradually worsening course since that time. The patient noted no past surgery on the left knee(s).  Patient currently rates pain in the left knee(s) at 8 out of 10 with activity. Patient has night pain, worsening of pain with activity and weight bearing, pain that interferes with activities of daily living, pain with passive range of motion and joint swelling.  Patient has evidence of joint space narrowing by imaging studies. This patient has had failure of all conservative care. There is no active infection.  Patient Active Problem List   Diagnosis Date Noted  . GERD (gastroesophageal reflux disease) 02/25/2018  . Osteopenia 02/25/2018  . Compression fracture of T12 vertebra ([place]) 05/30/2017  . Aortic atherosclerosis ([place]) 02/03/2017  . Seizure disorder ([place]) 12/15/2016  . History of TIA (transient ischemic attack) 12/15/2016  . BMI 24.0-24.9, adult 03/12/2015  . Skin cancer 08/06/2014  . Migraine 08/06/2014  . Generalized anxiety disorder 08/06/2014  . Incontinence in female 08/06/2014  . Prediabetes 07/01/2013  . Medication management 07/01/2013  . Hypertension   . Hyperlipidemia   . Vitamin D deficiency    Past Medical History:  Diagnosis Date  . Allergy   . Anemia   . Anxiety   . Aortic atherosclerosis ([place]) 11/2016   Noted on CT abd  . Arthritis    hands and knees  . Bilateral cataracts    History of  . Cellulitis    Right finger  . Cerebral amyloid angiopathy ([facility])    . Cervical spondylosis 12/2008   Noted on Cervical spine  . Chronic nausea   . Compression fracture of T12 vertebra ([facility]) 06/07/2017   Mild  . DDD (degenerative disc disease), cervical 12/2008   Noted on Cervical spine  . Depression   . Encephalopathy 01/02/2017   Mild, [place] EEG  . GERD (gastroesophageal reflux disease)   . Helicobacter pylori infection 2018  . History of appendicitis    age 78  . History of colon polyps   . History of confusion   . History of hiatal hernia 10/16/2016   Noted on Endoscopy  . Hyperlipidemia   . Internal hemorrhoid 08/02/2017   Noted on Colonoscopy  . Low blood pressure reading   . Migraines   . Osteopenia   . PONV (postoperative nausea and vomiting)   . Pre-diabetes   . RLS (restless legs syndrome)   . Seizure ([place])    atypical partial comples vs. confusional migraines  . Skin cancer    right lower eyelid  . Squamous cell skin cancer    history of neck and upper back  . TIA (transient ischemic attack)    questionable per patient  . Vitamin D deficiency     Past Surgical History:  Procedure Laterality Date  . APPENDECTOMY  1959  . CATARACT EXTRACTION, BILATERAL Bilateral 2014  . [place]  . COLONOSCOPY W/ POLYPECTOMY  08/02/2017  . KNEE ARTHROSCOPY Bilateral 04/2014   [facility]  . MOHS SURGERY Right 04/2013   Squamous cell cacinoma, neck/upper back  . SPINE SURGERY  2010   C6-C7 fusion  . UPPER GI ENDOSCOPY  10/16/2016    Current Facility-Administered Medications  Medication Dose Route Frequency Provider Last Rate Last Dose  . bupivacaine liposome (EXPAREL) 1.3 % injection 266 mg  20 mL Infiltration Once Dorna Leitz, MD      . ceFAZolin (ANCEF) 2-4 GM/100ML-% IVPB           . ceFAZolin (ANCEF) IVPB 2g/100 mL premix  2 g Intravenous Once Dorna Leitz, MD      . chlorhexidine (HIBICLENS) 4 % liquid 4 application  60 mL Topical Once Dorna Leitz, MD      . tranexamic acid (CYKLOKAPRON) IVPB 1,000 mg  1,000  mg Intravenous To OR Dorna Leitz, MD       Allergies  Allergen Reactions  . Banana Nausea And Vomiting  . Chocolate Other (See Comments)    Unknown  . Effexor [Venlafaxine] Other (See Comments)    insomnia  . Maxalt [Rizatriptan Benzoate] Nausea Only and Other (See Comments)    Nausea and excessive salivation  . Other Other (See Comments)    NUTS (all kinds)  . Tussin [Guaifenesin] Rash  . Zostavax [Zoster Vaccine Live] Itching and Rash    Social History   Tobacco Use  . Smoking status: Former Smoker    Packs/day: 1.00    Years: 4.00    Pack years: 4.00    Types: Cigarettes    Last attempt to quit: 03/25/1986    Years since quitting: 32.0  . Smokeless tobacco: Never Used  Substance Use Topics  . Alcohol use: Not Currently    Comment: occasional    Family History  Problem Relation Age of Onset  . Diabetes Mother   . Heart disease Mother   . Heart disease Father   . Stroke Father   . Hypertension Father   . Hyperlipidemia Sister   . Hypertension Brother      ROS ROS: I have reviewed the patient's review of systems thoroughly and there are no positive responses as relates to the HPI. Objective:  Physical Exam  Vital signs in last 24 hours: Temp:  [97.7 F (36.5 C)] 97.7 F (36.5 C) (12/06 0607) Resp:  [16] 16 (12/06 0607) BP: (95)/(58) 95/58 (12/06 0607) SpO2:  [98 %] 98 % (12/06 0607) Weight:  [68 kg] 68 kg (12/06 0607) Well-developed well-nourished patient in no acute distress. Alert and oriented x3 HEENT:within normal limits Cardiac: Regular rate and rhythm Pulmonary: Lungs clear to auscultation Abdomen: Soft and nontender.  Normal active bowel sounds  Musculoskeletal: (l knee: limited rom painful rom no instability +grind +inhibition) Labs:  Recent Results (from the past 2160 hour(s))  CBC with Differential/Platelet     Status: None   Collection Time: 02/26/18  2:37 PM  Result Value Ref Range   WBC 5.4 3.8 - 10.8 Thousand/uL   RBC 4.01 3.80 -  5.10 Million/uL   Hemoglobin 12.3 11.7 - 15.5 g/dL   HCT 36.2 35.0 - 45.0 %   MCV 90.3 80.0 - 100.0 fL   MCH 30.7 27.0 - 33.0 pg   MCHC 34.0 32.0 - 36.0 g/dL   RDW 12.0 11.0 - 15.0 %   Platelets 256 140 - 400 Thousand/uL   MPV 10.5 7.5 - 12.5 fL   Neutro Abs 3,008 1,500 - 7,800 cells/uL   Lymphs Abs 1,696 850 - 3,900 cells/uL   WBC mixed population 529 200 - 950 cells/uL   Eosinophils Absolute 108 15 - 500 cells/uL   Basophils  Absolute 59 0 - 200 cells/uL   Neutrophils Relative % 55.7 %   Total Lymphocyte 31.4 %   Monocytes Relative 9.8 %   Eosinophils Relative 2.0 %   Basophils Relative 1.1 %  COMPLETE METABOLIC PANEL WITH GFR     Status: None   Collection Time: 02/26/18  2:37 PM  Result Value Ref Range   Glucose, Bld 97 65 - 99 mg/dL    Comment: .            Fasting reference interval .    BUN 8 7 - 25 mg/dL   Creat 0.76 0.60 - 0.93 mg/dL    Comment: For patients >59 years of age, the reference limit for Creatinine is approximately 13% higher for people identified as African-American. .    GFR, Est Non African American 79 > OR = 60 mL/min/1.28m2   GFR, Est African American 91 > OR = 60 mL/min/1.96m2   BUN/Creatinine Ratio NOT APPLICABLE 6 - 22 (calc)   Sodium 139 135 - 146 mmol/L   Potassium 4.8 3.5 - 5.3 mmol/L   Chloride 106 98 - 110 mmol/L   CO2 25 20 - 32 mmol/L   Calcium 10.1 8.6 - 10.4 mg/dL   Total Protein 6.3 6.1 - 8.1 g/dL   Albumin 4.2 3.6 - 5.1 g/dL   Globulin 2.1 1.9 - 3.7 g/dL (calc)   AG Ratio 2.0 1.0 - 2.5 (calc)   Total Bilirubin 0.4 0.2 - 1.2 mg/dL   Alkaline phosphatase (APISO) 59 33 - 130 U/L   AST 16 10 - 35 U/L   ALT 9 6 - 29 U/L  Magnesium     Status: None   Collection Time: 02/26/18  2:37 PM  Result Value Ref Range   Magnesium 2.2 1.5 - 2.5 mg/dL  Lipid panel     Status: None   Collection Time: 02/26/18  2:37 PM  Result Value Ref Range   Cholesterol 147 <200 mg/dL   HDL 60 >50 mg/dL   Triglycerides 95 <150 mg/dL   LDL Cholesterol  (Calc) 69 mg/dL (calc)    Comment: Reference range: <100 . Desirable range <100 mg/dL for primary prevention;   <70 mg/dL for patients with CHD or diabetic patients  with > or = 2 CHD risk factors. Marland Kitchen LDL-C is now calculated using the Martin-Hopkins  calculation, which is a validated novel method providing  better accuracy than the Friedewald equation in the  estimation of LDL-C.  Cresenciano Genre et al. Annamaria Helling. 0865;784(69): 2061-2068  (http://education.[employer].com/faq/FAQ164)    Total CHOL/HDL Ratio 2.5 <5.0 (calc)   Non-HDL Cholesterol (Calc) 87 <130 mg/dL (calc)    Comment: For patients with diabetes plus 1 major ASCVD risk  factor, treating to a non-HDL-C goal of <100 mg/dL  (LDL-C of <70 mg/dL) is considered a therapeutic  option.   TSH     Status: None   Collection Time: 02/26/18  2:37 PM  Result Value Ref Range   TSH 1.36 0.40 - 4.50 mIU/L  Hemoglobin A1c     Status: None   Collection Time: 02/26/18  2:37 PM  Result Value Ref Range   Hgb A1c MFr Bld 5.6 <5.7 % of total Hgb    Comment: For the purpose of screening for the presence of diabetes: . <5.7%       Consistent with the absence of diabetes 5.7-6.4%    Consistent with increased risk for diabetes             (prediabetes) >  or =6.5%  Consistent with diabetes . This assay result is consistent with a decreased risk of diabetes. . Currently, no consensus exists regarding use of hemoglobin A1c for diagnosis of diabetes in children. . According to American Diabetes Association (ADA) guidelines, hemoglobin A1c <7.0% represents optimal control in non-pregnant diabetic patients. Different metrics may apply to specific patient populations.  Standards of Medical Care in Diabetes(ADA). .    Mean Plasma Glucose 114 (calc)   eAG (mmol/L) 6.3 (calc)  Valproic acid level     Status: Abnormal   Collection Time: 02/26/18  2:37 PM  Result Value Ref Range   Valproic Acid Lvl 43.9 (L) 50.0 - 100.0 mg/L  Urinalysis,  Routine w reflex microscopic     Status: Abnormal   Collection Time: 03/12/18  9:21 AM  Result Value Ref Range   Color, Urine YELLOW YELLOW   APPearance CLOUDY (A) CLEAR   Specific Gravity, Urine 1.009 1.005 - 1.030   pH 7.0 5.0 - 8.0   Glucose, UA NEGATIVE NEGATIVE mg/dL   Hgb urine dipstick NEGATIVE NEGATIVE   Bilirubin Urine NEGATIVE NEGATIVE   Ketones, ur NEGATIVE NEGATIVE mg/dL   Protein, ur NEGATIVE NEGATIVE mg/dL   Nitrite NEGATIVE NEGATIVE   Leukocytes, UA NEGATIVE NEGATIVE    Comment: Performed at [facility] [address] [place], [place] [address]  Surgical pcr screen     Status: None   Collection Time: 03/12/18  9:47 AM  Result Value Ref Range   MRSA, PCR NEGATIVE NEGATIVE   Staphylococcus aureus NEGATIVE NEGATIVE    Comment: (NOTE) The Xpert SA Assay (FDA approved for NASAL specimens in patients 51 years of age and older), is one component of a comprehensive surveillance program. It is not intended to diagnose infection nor to guide or monitor treatment. Performed at [facility], [place] [address] [place], [place] [address]   ABO/Rh     Status: None   Collection Time: 03/12/18 10:10 AM  Result Value Ref Range   ABO/RH(D)      A POS Performed at [facility], [place] [address], [place], [place] [address]   APTT     Status: None   Collection Time: 03/12/18 10:15 AM  Result Value Ref Range   aPTT 28 24 - 36 seconds    Comment: Performed at [facility], [place] [address], [place], [place] [address]  Protime-INR     Status: None   Collection Time: 03/12/18 10:15 AM  Result Value Ref Range   Prothrombin Time 12.7 11.4 - 15.2 seconds   INR 0.96     Comment: Performed at [facility], [place] [address] [place], [place] [address]  Type and screen Order type and screen if day of surgery is less than 15 days from draw of preadmission visit or order morning of surgery if day of  surgery is greater than 6 days from preadmission visit.     Status: None   Collection Time: 03/12/18 10:15 AM  Result Value Ref Range   ABO/RH(D) A POS    Antibody Screen NEG    Sample Expiration 03/26/2018    Extend sample reason      NO TRANSFUSIONS OR PREGNANCY IN THE PAST 3 MONTHS Performed at [facility], [place] [address] [place], [place] [address]     Estimated body mass index is 23.49 kg/m as calculated from the following:   Height as of this encounter: 5\' 7"  (1.702 m).  Weight as of this encounter: 68 kg.   Imaging Review Plain radiographs demonstrate severe degenerative joint disease of the left knee(s). The overall alignment isneutral. The bone quality appears to be fair for age and reported activity level.   Preoperative templating of the joint replacement has been completed, documented, and submitted to the Operating Room personnel in order to optimize intra-operative equipment management.    Patient's anticipated LOS is less than 2 midnights, meeting these requirements: - Younger than 101 - Lives within 1 hour of care - Has a competent adult at home to recover with post-op recover - NO history of  - Chronic pain requiring opiods  - Diabetes  - Coronary Artery Disease  - Heart failure  - Heart attack  - Stroke  - DVT/VTE  - Cardiac arrhythmia  - Respiratory Failure/COPD  - Renal failure  - Anemia  - Advanced Liver disease        Assessment/Plan:  End stage arthritis, left knee   The patient history, physical examination, clinical judgment of the provider and imaging studies are consistent with end stage degenerative joint disease of the left knee(s) and total knee arthroplasty is deemed medically necessary. The treatment options including medical management, injection therapy arthroscopy and arthroplasty were discussed at length. The risks and benefits of total knee arthroplasty were presented and reviewed. The risks due to aseptic  loosening, infection, stiffness, patella tracking problems, thromboembolic complications and other imponderables were discussed. The patient acknowledged the explanation, agreed to proceed with the plan and consent was signed. Patient is being admitted for inpatient treatment for surgery, pain control, PT, OT, prophylactic antibiotics, VTE prophylaxis, progressive ambulation and ADL's and discharge planning. The patient is planning to be discharged home with home health services

## 2018-03-22 NOTE — Anesthesia Procedure Notes (Signed)
Procedure Name: MAC Date/Time: 03/22/2018 7:21 AM Performed by: Lissa Morales, CRNA Pre-anesthesia Checklist: Patient identified, Emergency Drugs available, Suction available, Patient being monitored and Timeout performed Patient Re-evaluated:Patient Re-evaluated prior to induction Oxygen Delivery Method: Simple face mask Placement Confirmation: positive ETCO2

## 2018-03-22 NOTE — Anesthesia Procedure Notes (Addendum)
Spinal  Patient location during procedure: OR Start time: 03/22/2018 7:28 AM End time: 03/22/2018 7:32 AM Staffing Anesthesiologist: Janeece Riggers, MD Preanesthetic Checklist Completed: patient identified, site marked, surgical consent, pre-op evaluation, timeout performed, IV checked, risks and benefits discussed and monitors and equipment checked Spinal Block Patient position: sitting Prep: DuraPrep Patient monitoring: heart rate, cardiac monitor, continuous pulse ox and blood pressure Approach: midline Location: L2-3 Injection technique: single-shot Needle Needle type: Sprotte  Needle gauge: 24 G Needle length: 9 cm Assessment Sensory level: T4

## 2018-03-22 NOTE — Brief Op Note (Signed)
03/22/2018  8:50 AM  PATIENT:  Deborah [name]  72 y.o. female  PRE-OPERATIVE DIAGNOSIS:  OSTEOARTHRITIS LEFT KNEE  POST-OPERATIVE DIAGNOSIS:  OSTEOARTHRITIS LEFT KNEE  PROCEDURE:  Procedure(s) with comments: LEFT TOTAL KNEE ARTHROPLASTY (Left) - Adductor Block  SURGEON:  Surgeon(s) and Role:    Dorna Leitz, MD - Primary  PHYSICIAN ASSISTANT:   ASSISTANTS: jim bethune   ANESTHESIA:   spinal  EBL:  50 mL   BLOOD ADMINISTERED:none  DRAINS: none   LOCAL MEDICATIONS USED:  MARCAINE    and OTHER experel  SPECIMEN:  No Specimen  DISPOSITION OF SPECIMEN:  N/A  COUNTS:  YES  TOURNIQUET:   Total Tourniquet Time Documented: Thigh (Left) - 55 minutes Total: Thigh (Left) - 55 minutes   DICTATION: .Other Dictation: Dictation Number [id number]  PLAN OF CARE: Admit for overnight observation  PATIENT DISPOSITION:  PACU - hemodynamically stable.   Delay start of Pharmacological VTE agent (>24hrs) due to surgical blood loss or risk of bleeding: no

## 2018-03-22 NOTE — Transfer of Care (Signed)
Immediate Anesthesia Transfer of Care Note  Patient: Deborah [name]  Procedure(s) Performed: LEFT TOTAL KNEE ARTHROPLASTY (Left Knee)  Patient Location: PACU  Anesthesia Type:Spinal  Level of Consciousness: awake, alert , oriented and patient cooperative  Airway & Oxygen Therapy: Patient Spontanous Breathing and Patient connected to face mask oxygen  Post-op Assessment: Report given to RN and Post -op Vital signs reviewed and stable  Post vital signs: stable  Last Vitals:  Vitals Value Taken Time  BP 115/72 03/22/2018  9:16 AM  Temp    Pulse 70 03/22/2018  9:20 AM  Resp 13 03/22/2018  9:20 AM  SpO2 100 % 03/22/2018  9:20 AM  Vitals shown include unvalidated device data.  Last Pain:  Vitals:   03/22/18 0915  TempSrc:   PainSc: 0-No pain         Complications: No apparent anesthesia complications

## 2018-03-22 NOTE — Plan of Care (Signed)

## 2018-03-22 NOTE — Anesthesia Procedure Notes (Addendum)
Anesthesia Regional Block: Adductor canal block   Pre-Anesthetic Checklist: ,, timeout performed, Correct Patient, Correct Site, Correct Laterality, Correct Procedure, Correct Position, site marked, Risks and benefits discussed,  Surgical consent,  Pre-op evaluation,  At surgeon's request and post-op pain management  Laterality: Left  Prep: chloraprep       Needles:  Injection technique: Single-shot  Needle Type: Echogenic Stimulator Needle     Needle Length: 5cm  Needle Gauge: 22     Additional Needles:   Procedures:, nerve stimulator,,, ultrasound used (permanent image in chart),,,,   Nerve Stimulator or Paresthesia:  Response: quadraceps contraction, 0.45 mA,   Additional Responses:   Narrative:  Start time: 03/22/2018 7:10 AM End time: 03/22/2018 7:15 AM Injection made incrementally with aspirations every 5 mL.  Performed by: Personally  Anesthesiologist: Janeece Riggers, MD  Additional Notes: Functioning IV was confirmed and monitors were applied.  A 51mm 22ga Arrow echogenic stimulator needle was used. Sterile prep and drape,hand hygiene and sterile gloves were used. Ultrasound guidance: relevant anatomy identified, needle position confirmed, local anesthetic spread visualized around nerve(s)., vascular puncture avoided.  Image printed for medical record. Negative aspiration and negative test dose prior to incremental administration of local anesthetic. The patient tolerated the procedure well.

## 2018-03-22 NOTE — Op Note (Signed)
NAME: Deborah [name], Deborah [name] MEDICAL RECORD [id number] ACCOUNT [id number] DATE OF BIRTH:March 06, 1946 FACILITY: [facility] LOCATION: [facility] PHYSICIAN:Eytan Carrigan L. Wanetta Funderburke, MD  OPERATIVE REPORT  DATE OF PROCEDURE:  03/22/2018  PREOPERATIVE DIAGNOSIS:  End-stage degenerative joint disease, left knee, with severe bone-on-bone change, particularly in the patellofemoral joint.  POSTOPERATIVE DIAGNOSIS:  End-stage degenerative joint disease, left knee, with severe bone-on-bone change, particularly in the patellofemoral joint.  PROCEDURE:  Left total knee replacement with an Attune system, size 5 femur, size 5 tibia, 6 mm bridging bearing, and a 38 mm all polyethylene patella.  SURGEON:  Dorna Leitz, MD  ASSISTANT:  Gaspar Skeeters, PA-C, who was present for the entire case and assisted with retraction, bone cuts, and closing to minimize OR time.  SURGEON:  Dorna Leitz, MD  ANESTHESIA:  Spinal.  BRIEF HISTORY:  The patient is a 72 year old female with a long history of significant complaints of left knee pain.  She has been treated conservatively for a prolonged period of time.  After failure of all conservative care, she is taken to the  operating room for left total knee replacement.  She was having night pain and light activity pain.  She had x-rays showing bone-on-bone change.  DESCRIPTION OF PROCEDURE:  The patient was taken to the operating room.  After adequate anesthesia was obtained with spinal anesthetic, the patient was placed supine on the operating table.  Left leg was prepped and draped in the usual sterile fashion.   Following this, the leg was exsanguinated, blood pressure tourniquet inflated to 300 mmHg.  Following this, a midline incision was made in subcutaneous tissues, dissected down to the level of the extensor mechanism and a medial parapatellar arthrotomy  was undertaken.  Following this, the medial and lateral meniscus were removed, retropatellar fat pad, synovium on the anterior aspect  of the femur, and the anterior and posterior cruciates.  Following this, attention was turned to the femur where an  intramedullary pilot hole was drilled and a 5 degree valgus inclination cut is made followed by sizing of the femur to a size 5.  Anterior and posterior cuts were made, chamfers and box.  Attention was then turned towards the tibia, which was cut  perpendicular to its long axis with 3 degree posterior slope and once that was done, attention was turned towards sizing the tibia.  It was sized to a 5.  It was drilled and keeled.  Attention was then turned towards putting a spacer block and a 5 spacer  block was put in place, a little loose in flexion.  I got out to full extension, went with a 6.  It was a better fit and feel.  I cut the patella down to the level of 13 mm.  Lugs were drilled and the patella was placed.  Excellent range of motion and  stability were achieved at this time.  The trial components were removed.  The knee was copiously and thoroughly irrigated with pulsatile lavage irrigation and suctioned dry.  The final components were then cemented into place, size 5 tibia, size 5  femur, 6 mm bridging bearing trial placed and a 38 all poly patella was placed and held with a clamp.  Once this was done, attention was turned towards removal of all excess bone cement.  Cement was allowed to completely harden.  Once the cement was  completely hardened, attention was turned towards letting the tourniquet down.  All bleeders controlled with electrocautery.  The final size 6 was opened and placed.  Excellent range of motion and stability were achieved.  Medial parapatellar arthrotomy  was closed with #1 Vicryl running, skin was closed with 0 and 2-0 Vicryl and 3-0 Monocryl subcuticular.  Benzoin and Steri-Strips were applied.  Sterile compressive dressing was applied and the patient was taken to recovery was noted to be in  satisfactory condition.    Estimated blood loss for procedure was  minimal.  Of note Gaspar Skeeters assisted throughout the case, was present for the entire case and assisted with retraction, bone cuts and closing to minimize OR time.  AN/NUANCE  D:03/22/2018 T:03/22/2018 JOB:[id number]

## 2018-03-22 NOTE — Discharge Instructions (Signed)

## 2018-03-23 DIAGNOSIS — Z8673 Personal history of transient ischemic attack (TIA), and cerebral infarction without residual deficits: Secondary | ICD-10-CM | POA: Diagnosis not present

## 2018-03-23 DIAGNOSIS — I10 Essential (primary) hypertension: Secondary | ICD-10-CM | POA: Diagnosis not present

## 2018-03-23 DIAGNOSIS — G40909 Epilepsy, unspecified, not intractable, without status epilepticus: Secondary | ICD-10-CM | POA: Diagnosis not present

## 2018-03-23 DIAGNOSIS — M1712 Unilateral primary osteoarthritis, left knee: Secondary | ICD-10-CM | POA: Diagnosis not present

## 2018-03-23 LAB — BASIC METABOLIC PANEL
Anion gap: 7 (ref 5–15)
BUN: 13 mg/dL (ref 8–23)
CO2: 21 mmol/L — ABNORMAL LOW (ref 22–32)
Calcium: 8.6 mg/dL — ABNORMAL LOW (ref 8.9–10.3)
Chloride: 110 mmol/L (ref 98–111)
Creatinine, Ser: 0.73 mg/dL (ref 0.44–1.00)
GFR calc non Af Amer: >60 mL/min (ref >60)
Glucose, Bld: 162 mg/dL — ABNORMAL HIGH (ref 70–99)
Potassium: 3.7 mmol/L (ref 3.5–5.1)
Sodium: 138 mmol/L (ref 135–145)

## 2018-03-23 LAB — CBC
HCT: 32.3 % — ABNORMAL LOW (ref 36.0–46.0)
Hemoglobin: 10.3 g/dL — ABNORMAL LOW (ref 12.0–15.0)
MCH: 30.2 pg (ref 26.0–34.0)
MCHC: 31.9 g/dL (ref 30.0–36.0)
MCV: 94.7 fL (ref 80.0–100.0)
Platelets: 210 10*3/uL (ref 150–400)
RBC: 3.41 MIL/uL — ABNORMAL LOW (ref 3.87–5.11)
RDW: 12.1 % (ref 11.5–15.5)
WBC: 12.3 10*3/uL — AB (ref 4.0–10.5)
nRBC: 0 % (ref 0.0–0.2)

## 2018-03-23 NOTE — Plan of Care (Signed)

## 2018-03-23 NOTE — Evaluation (Signed)
Physical Therapy Evaluation Patient Details Name: Deborah [name] MRN: [id number] DOB: Apr 25, 1945 Today's Date: 03/23/2018   History of Present Illness  Pt s/p L TKR and with hx of TIA, Sz and c6-7 fusion  Clinical Impression  Pt s/p L TKR and presents with decreased strength/ROM and post op pain limiting functional mobility.  Pt should progress to dc home with family assist.    Follow Up Recommendations Home health PT;Follow surgeon's recommendation for DC plan and follow-up therapies    Equipment Recommendations  None recommended by PT    Recommendations for Other Services       Precautions / Restrictions Precautions Precautions: Fall Restrictions Weight Bearing Restrictions: No Other Position/Activity Restrictions: WBAT      Mobility  Bed Mobility Overal bed mobility: Needs Assistance Bed Mobility: Supine to Sit     Supine to sit: Min assist;Mod assist;+2 for physical assistance;+2 for safety/equipment     General bed mobility comments: cues for sequence and use of R LE to self assist  Transfers Overall transfer level: Needs assistance Equipment used: Rolling walker (2 wheeled) Transfers: Sit to/from Stand Sit to Stand: Min assist;Mod assist;+2 safety/equipment         General transfer comment: cues for LE management and use of UEs to self assist  Ambulation/Gait Ambulation/Gait assistance: Min assist;Mod assist;+2 safety/equipment Gait Distance (Feet): 70 Feet Assistive device: Rolling walker (2 wheeled) Gait Pattern/deviations: Step-to pattern;Step-through pattern;Decreased step length - right;Decreased step length - left;Shuffle;Trunk flexed Gait velocity: decr   General Gait Details: cues for sequence, posture and position from RW.  Physical assist for balance, support and RW management  Stairs            Wheelchair Mobility    Modified Rankin (Stroke Patients Only)       Balance Overall balance assessment: Needs  assistance Sitting-balance support: No upper extremity supported;Feet supported Sitting balance-Leahy Scale: Good     Standing balance support: Bilateral upper extremity supported Standing balance-Leahy Scale: Poor                               Pertinent Vitals/Pain Pain Assessment: Faces Faces Pain Scale: Hurts little more Pain Location: L knee Pain Descriptors / Indicators: Aching;Sore Pain Intervention(s): Limited activity within patient's tolerance;Monitored during session;Premedicated before session;Ice applied    Home Living Family/patient expects to be discharged to:: Private residence Living Arrangements: Spouse/significant other Available Help at Discharge: Family Type of Home: House Home Access: Level entry     Home Layout: One level Home Equipment: Environmental consultant - 2 wheels(commode riser)      Prior Function Level of Independence: Independent               Hand Dominance   Dominant Hand: Right    Extremity/Trunk Assessment   Upper Extremity Assessment Upper Extremity Assessment: Overall WFL for tasks assessed    Lower Extremity Assessment Lower Extremity Assessment: LLE deficits/detail LLE Deficits / Details: 2/5 quads with AAROM at knee -10-  65    Cervical / Trunk Assessment Cervical / Trunk Assessment: Normal  Communication   Communication: Expressive difficulties(word finding difficulty)  Cognition Arousal/Alertness: Awake/alert Behavior During Therapy: Flat affect Overall Cognitive Status: Impaired/Different from baseline Area of Impairment: Problem solving                             Problem Solving: Slow processing General Comments: Delayed response, slow processing,  very literal interpretation       General Comments      Exercises Total Joint Exercises Ankle Circles/Pumps: AROM;Both;15 reps;Supine Quad Sets: AROM;Both;10 reps;Supine Heel Slides: AAROM;Left;Supine;15 reps Straight Leg Raises: AAROM;Left;10  reps;Supine   Assessment/Plan    PT Assessment Patient needs continued PT services  PT Problem List Decreased strength;Decreased range of motion;Decreased activity tolerance;Decreased balance;Decreased mobility;Decreased knowledge of use of DME;Pain       PT Treatment Interventions Gait training;DME instruction;Stair training;Functional mobility training;Therapeutic activities;Therapeutic exercise;Patient/family education;Balance training    PT Goals (Current goals can be found in the Care Plan section)  Acute Rehab PT Goals Patient Stated Goal: Home PT Goal Formulation: With patient Time For Goal Achievement: 03/30/18 Potential to Achieve Goals: Good    Frequency 7X/week   Barriers to discharge        Co-evaluation               AM-PAC PT "6 Clicks" Mobility  Outcome Measure Help needed turning from your back to your side while in a flat bed without using bedrails?: A Lot Help needed moving from lying on your back to sitting on the side of a flat bed without using bedrails?: A Lot Help needed moving to and from a bed to a chair (including a wheelchair)?: A Lot Help needed standing up from a chair using your arms (e.g., wheelchair or bedside chair)?: A Lot Help needed to walk in hospital room?: A Lot Help needed climbing 3-5 steps with a railing? : A Lot 6 Click Score: 12    End of Session Equipment Utilized During Treatment: Gait belt;Left knee immobilizer Activity Tolerance: Patient tolerated treatment well Patient left: in chair;with call bell/phone within reach;with family/visitor present Nurse Communication: Mobility status PT Visit Diagnosis: Difficulty in walking, not elsewhere classified (R26.2)    Time: 7793-9030 PT Time Calculation (min) (ACUTE ONLY): 32 min   Charges:   PT Evaluation $PT Eval Low Complexity: 1 Low PT Treatments $Therapeutic Exercise: 8-22 mins        Debe Coder PT Acute Rehabilitation Services Pager [phone] Office  [phone]   Darin Arndt 03/23/2018, 12:37 PM

## 2018-03-23 NOTE — Progress Notes (Addendum)
Called rrt nurse (at 2340) due to drop in patients bp along with heart rate into the 30's. Apical rate was 42, patient was responding to questions and was otherwise asymptomatic. Responses somewhat delayed. EKG showed sinus arrhythmia, bp and hr have stabilized, called placed to PA on call Modena Slater) at [place], message left regarding this episode of hypotension and bradycardia. No call back received. Will continue to monitor.

## 2018-03-23 NOTE — Progress Notes (Signed)
Physical Therapy Treatment Patient Details Name: Deborah [name] MRN: [id number] DOB: 1945/11/28 Today's Date: 03/23/2018    History of Present Illness Pt s/p L TKR and with hx of TIA, Sz and c6-7 fusion    PT Comments    Pt cooperative but continues very unsteady with ambulation, with questionable safety awareness and intermittently confused by basic cues.   Follow Up Recommendations  Home health PT;Follow surgeon's recommendation for DC plan and follow-up therapies     Equipment Recommendations  None recommended by PT    Recommendations for Other Services       Precautions / Restrictions Precautions Precautions: Fall Restrictions Weight Bearing Restrictions: No Other Position/Activity Restrictions: WBAT    Mobility  Bed Mobility Overal bed mobility: Needs Assistance Bed Mobility: Sit to Supine       Sit to supine: Min assist;Mod assist   General bed mobility comments: cues for sequence and use of R LE to self assist  Transfers Overall transfer level: Needs assistance Equipment used: Rolling walker (2 wheeled) Transfers: Sit to/from Stand Sit to Stand: Min assist;Mod assist         General transfer comment: cues for LE management and use of UEs to self assist; physical assist to bring wt up and fwd and to balance in initial standing  Ambulation/Gait Ambulation/Gait assistance: Min assist;Mod assist;+2 safety/equipment Gait Distance (Feet): 100 Feet Assistive device: Rolling walker (2 wheeled) Gait Pattern/deviations: Step-to pattern;Step-through pattern;Decreased step length - right;Decreased step length - left;Shuffle;Trunk flexed Gait velocity: decr   General Gait Details: cues for sequence, posture and position from RW.  Physical assist for balance, support and RW management   Stairs             Wheelchair Mobility    Modified Rankin (Stroke Patients Only)       Balance Overall balance assessment: Needs assistance Sitting-balance  support: No upper extremity supported;Feet supported Sitting balance-Leahy Scale: Good     Standing balance support: Bilateral upper extremity supported Standing balance-Leahy Scale: Poor                              Cognition Arousal/Alertness: Awake/alert Behavior During Therapy: Flat affect Overall Cognitive Status: Impaired/Different from baseline Area of Impairment: Problem solving                             Problem Solving: Slow processing General Comments: Delayed response, slow processing, very literal interpretation       Exercises      General Comments        Pertinent Vitals/Pain Pain Assessment: Faces Faces Pain Scale: Hurts little more Pain Location: L knee Pain Descriptors / Indicators: Aching;Sore Pain Intervention(s): Limited activity within patient's tolerance;Premedicated before session;Monitored during session;Ice applied    Home Living                      Prior Function            PT Goals (current goals can now be found in the care plan section) Acute Rehab PT Goals Patient Stated Goal: Home PT Goal Formulation: With patient Time For Goal Achievement: 03/30/18 Potential to Achieve Goals: Good Progress towards PT goals: Progressing toward goals    Frequency    7X/week      PT Plan Current plan remains appropriate    Co-evaluation  AM-PAC PT "6 Clicks" Mobility   Outcome Measure  Help needed turning from your back to your side while in a flat bed without using bedrails?: A Lot Help needed moving from lying on your back to sitting on the side of a flat bed without using bedrails?: A Lot Help needed moving to and from a bed to a chair (including a wheelchair)?: A Lot Help needed standing up from a chair using your arms (e.g., wheelchair or bedside chair)?: A Lot Help needed to walk in hospital room?: A Lot Help needed climbing 3-5 steps with a railing? : A Lot 6 Click Score:  12    End of Session Equipment Utilized During Treatment: Gait belt;Left knee immobilizer Activity Tolerance: Patient tolerated treatment well Patient left: in bed;with call bell/phone within reach;with bed alarm set Nurse Communication: Mobility status PT Visit Diagnosis: Difficulty in walking, not elsewhere classified (R26.2)     Time: 2010-0712 PT Time Calculation (min) (ACUTE ONLY): 18 min  Charges:  $Gait Training: 8-22 mins                     [place] Pager [phone] Office [phone]    Bridgette Wolden 03/23/2018, 4:11 PM

## 2018-03-23 NOTE — Progress Notes (Signed)
Subjective: 1 Day Post-Op Procedure(s) (LRB): LEFT TOTAL KNEE ARTHROPLASTY (Left) Patient reports pain as moderate. Had episode of bradycardia overnight.  Patient was alert and oriented through the entire process.  She now is doing fine without any complaints.  Her vital signs are stable.  Not been up with physical therapy as of 845 this a.m.   Objective: Vital signs in last 24 hours: Temp:  [97.4 F (36.3 C)-98.2 F (36.8 C)] 98 F (36.7 C) (12/07 0514) Pulse Rate:  [35-72] 64 (12/07 0514) Resp:  [12-19] 14 (12/07 0514) BP: (88-109)/(58-71) 98/65 (12/07 0514) SpO2:  [95 %-100 %] 96 % (12/07 0514)  Intake/Output from previous day: 12/06 0701 - 12/07 0700 In: 4573.3 [P.O.:720; I.V.:3753.3; IV Piggyback:100] Out: 1925 [Urine:1875; Blood:50] Intake/Output this shift: No intake/output data recorded.  Recent Labs    03/23/18 0351  HGB 10.3*   Recent Labs    03/23/18 0351  WBC 12.3*  RBC 3.41*  HCT 32.3*  PLT 210   Recent Labs    03/23/18 0351  NA 138  K 3.7  CL 110  CO2 21*  BUN 13  CREATININE 0.73  GLUCOSE 162*  CALCIUM 8.6*   No results for input(s): LABPT, INR in the last 72 hours. Left knee exam: Neurovascular intact Sensation intact distally Intact pulses distally Dorsiflexion/Plantar flexion intact Incision: dressing C/D/I Compartment soft   Assessment/Plan: 1 Day Post-Op Procedure(s) (LRB): LEFT TOTAL KNEE ARTHROPLASTY (Left)  Plan: Up with physical therapy. Aspirin 325 mg twice daily 1 month postop for DVT prophylaxis. Probable discharge home with home health PT tomorrow.    Erlene Senters 03/23/2018, 10:52 AM

## 2018-03-23 NOTE — Progress Notes (Signed)
Patient has poor safety awareness, and poor attention span.   RN concerned, and discussed patient baseline with husband.   While walking to the bathroom, patient stated several different things including "my parents died a long time ago and I am very upset about it" "I am suing just so you know because you didn't tell my father"  These statements were made after I asked her if she needed to go to the bathroom, and while I was helping her move to the side of the bed.   Patient has trouble following commands, and cannot remember the statement that I had just made.   When I first went to the room at 1640, the patient was eating vanilla ice cream with her finger, I asked her if I could get her a spoon so that she could eat her icecream. I walked down the hall, got the spoon, came back to the room, and proceeded to open the spoon to hand it to her. I handed her the spoon and she states "well what am I supposed to do with this?" I responded that it was for her to eat her ice cream. She looked at me, and then tried to hand the ice cream to her husband, and saying "this is for you, I don't want this."  Husband states that his wife is forgetful, but "it has gotten worse recently" Husband states that she sees a neurologist, and a primary care.   Will continue to monitor and implement all safety measures to keep patient from falling.

## 2018-03-23 NOTE — Significant Event (Signed)
Rapid Response Event Note  Overview: Time Called: 2340 Arrival Time: 2343 Event Type: Cardiac   Initial Focused Assessment: called d/t pt HR dropped to 30's and 40'3 per nurse.  Pt slow to respond, no change in neurologically per nurse.  Pt denies any pain and follows simple commands.    Interventions: pt placed on monitor VSS see flow sheet.  SR (irregular) per EKG.  HR nonsustained back to 70's.    Plan of Care (if not transferred): Pt will remain in current room.  Nurse instructed to call if there was anymore concerns and to make MD aware for follow-up.

## 2018-03-24 DIAGNOSIS — F22 Delusional disorders: Secondary | ICD-10-CM

## 2018-03-24 DIAGNOSIS — M1712 Unilateral primary osteoarthritis, left knee: Secondary | ICD-10-CM | POA: Diagnosis not present

## 2018-03-24 HISTORY — DX: Delusional disorders: F22

## 2018-03-24 LAB — CBC
HCT: 29.9 % — ABNORMAL LOW (ref 36.0–46.0)
Hemoglobin: 9.7 g/dL — ABNORMAL LOW (ref 12.0–15.0)
MCH: 30.2 pg (ref 26.0–34.0)
MCHC: 32.4 g/dL (ref 30.0–36.0)
MCV: 93.1 fL (ref 80.0–100.0)
Platelets: 193 10*3/uL (ref 150–400)
RBC: 3.21 MIL/uL — ABNORMAL LOW (ref 3.87–5.11)
RDW: 12.3 % (ref 11.5–15.5)
WBC: 10.6 10*3/uL — AB (ref 4.0–10.5)
nRBC: 0 % (ref 0.0–0.2)

## 2018-03-24 MED ORDER — HYDROCODONE-ACETAMINOPHEN 5-325 MG PO TABS: 1.0000 | ORAL_TABLET | Freq: Four times a day (QID) | ORAL | 0 refills | Status: DC | PRN

## 2018-03-24 NOTE — Plan of Care (Signed)

## 2018-03-24 NOTE — Discharge Summary (Signed)
Patient ID: Deborah [name] MRN: [id number] DOB/AGE: 72-May-1947 72 y.o.  Admit date: 03/22/2018 Discharge date: 03/24/2018  Admission Diagnoses:  Principal Problem:   Primary osteoarthritis of left knee Active Problems:   Delusions [name])   Discharge Diagnoses:  Same  Past Medical History:  Diagnosis Date  . Allergy   . Anemia   . Anxiety   . Aortic atherosclerosis ([place]) 11/2016   Noted on CT abd  . Arthritis    hands and knees  . Bilateral cataracts    History of  . Cellulitis    Right finger  . Cerebral amyloid angiopathy ([facility])   . Cervical spondylosis 12/2008   Noted on Cervical spine  . Chronic nausea   . Compression fracture of T12 vertebra ([facility]) 06/07/2017   Mild  . DDD (degenerative disc disease), cervical 12/2008   Noted on Cervical spine  . Depression   . Encephalopathy 01/02/2017   Mild, [place] EEG  . GERD (gastroesophageal reflux disease)   . Helicobacter pylori infection 2018  . History of appendicitis    age 72  . History of colon polyps   . History of confusion   . History of hiatal hernia 10/16/2016   Noted on Endoscopy  . Hyperlipidemia   . Internal hemorrhoid 08/02/2017   Noted on Colonoscopy  . Low blood pressure reading   . Migraines   . Osteopenia   . PONV (postoperative nausea and vomiting)   . Pre-diabetes   . RLS (restless legs syndrome)   . Seizure ([place])    atypical partial comples vs. confusional migraines  . Skin cancer    right lower eyelid  . Squamous cell skin cancer    history of neck and upper back  . TIA (transient ischemic attack)    questionable per patient  . Vitamin D deficiency     Surgeries: Procedure(s): LEFT TOTAL KNEE ARTHROPLASTY on 03/22/2018   Discharged Condition: Improved  Hospital Course: Deborah [name] is an 72 y.o. female who was admitted 03/22/2018 for operative treatment ofPrimary osteoarthritis of left knee. Patient has severe unremitting pain that affects sleep, daily activities, and  work/hobbies. After pre-op clearance the patient was taken to the operating room on 03/22/2018 and underwent  Procedure(s): LEFT TOTAL KNEE ARTHROPLASTY.    Patient was given perioperative antibiotics:  Anti-infectives (From admission, onward)   Start     Dose/Rate Route Frequency Ordered Stop   03/22/18 1400  ceFAZolin (ANCEF) IVPB 2g/100 mL premix     2 g 200 mL/hr over 30 Minutes Intravenous Every 6 hours 03/22/18 1104 03/23/18 0723   03/22/18 0715  ceFAZolin (ANCEF) IVPB 2g/100 mL premix     2 g 200 mL/hr over 30 Minutes Intravenous  Once 03/22/18 0705 03/22/18 0733   03/22/18 0648  ceFAZolin (ANCEF) 2-4 GM/100ML-% IVPB    Note to Pharmacy:  Randa Evens  : cabinet override      03/22/18 0648 03/22/18 1859   03/22/18 0600  vancomycin (VANCOCIN) IVPB 1000 mg/200 mL premix  Status:  Discontinued     1,000 mg 200 mL/hr over 60 Minutes Intravenous On call to O.R. 03/22/18 5284 03/22/18 0704       Patient was given sequential compression devices, early ambulation, and chemoprophylaxis to prevent DVT.  Patient benefited maximally from hospital stay and there were no complications.    Recent vital signs:  Patient Vitals for the past 24 hrs:  BP Temp Temp src Pulse Resp SpO2  03/24/18 0513 121/83 98 F (36.7 C)  Oral 71 15 97 %  03/23/18 2112 115/73 97.8 F (36.6 C) Oral 81 16 100 %     Recent laboratory studies:  Recent Labs    03/23/18 0351 03/24/18 0446  WBC 12.3* 10.6*  HGB 10.3* 9.7*  HCT 32.3* 29.9*  PLT 210 193  NA 138  --   K 3.7  --   CL 110  --   CO2 21*  --   BUN 13  --   CREATININE 0.73  --   GLUCOSE 162*  --   CALCIUM 8.6*  --      Discharge Medications:   Allergies as of 03/24/2018      Reactions   Banana Nausea And Vomiting   Chocolate Other (See Comments)   Unknown   Effexor [venlafaxine] Other (See Comments)   insomnia   Maxalt [rizatriptan Benzoate] Nausea Only, Other (See Comments)   Nausea and excessive salivation   Other Other (See  Comments)   NUTS (all kinds)   Tussin [guaifenesin] Rash   Zostavax [zoster Vaccine Live] Itching, Rash      Medication List    STOP taking these medications   cephALEXin 500 MG capsule Commonly known as:  KEFLEX     TAKE these medications   ALPRAZolam 0.5 MG tablet Commonly known as:  XANAX Take 1/2 to 1 tablet 2 to 3 x /day ONLY as needed to rescue Panic / Anxiety attacks What changed:    how much to take  how to take this  when to take this  reasons to take this  additional instructions   aspirin EC 325 MG tablet Take 1 tablet (325 mg total) by mouth 2 (two) times daily after a meal. Take x 1 month post op to decrease risk of blood clots.   Biotin 10 MG Caps Take 10 mg by mouth daily.   cyclobenzaprine 10 MG tablet Commonly known as:  FLEXERIL Take 0.5 tablets (5 mg total) by mouth 3 (three) times daily as needed for muscle spasms.   divalproex 250 MG DR tablet Commonly known as:  DEPAKOTE Take 1 tablet daily or as directed for headaches and seizures What changed:    how much to take  how to take this  when to take this  additional instructions   docusate sodium 100 MG capsule Commonly known as:  COLACE Take 1 capsule (100 mg total) by mouth 2 (two) times daily.   escitalopram 20 MG tablet Commonly known as:  LEXAPRO TAKE 1/2 TO 1 (ONE-HALF TO ONE) TABLET BY MOUTH ONCE DAILY PREVENTION  OF  CHRONIC  ANXIETY  AND  PANIC  ATTACKS What changed:    how much to take  how to take this  when to take this  additional instructions   ezetimibe 10 MG tablet Commonly known as:  ZETIA TAKE 1 TABLET BY MOUTH ONCE DAILY FOR CHOLESTEROL What changed:    how much to take  how to take this  when to take this  additional instructions   famotidine 40 MG tablet Commonly known as:  PEPCID Take 40 mg by mouth daily.   fluticasone 50 MCG/ACT nasal spray Commonly known as:  FLONASE Place 2 sprays into both nostrils daily as needed for allergies.    HYDROcodone-acetaminophen 5-325 MG tablet Commonly known as:  NORCO/VICODIN Take 1-2 tablets by mouth every 6 (six) hours as needed for severe pain.   metoCLOPramide 10 MG tablet Commonly known as:  REGLAN Take 1 tablet (10 mg total) by mouth  every 6 (six) hours as needed for nausea (or headache).   MULTIVITAMIN PO Take 1 tablet by mouth daily.   rosuvastatin 20 MG tablet Commonly known as:  CRESTOR TAKE 1 TABLET BY MOUTH ONCE DAILY   SUMAtriptan 20 MG/ACT nasal spray Commonly known as:  IMITREX USE ONE DOSE IN THE NOSE NOW, MAY REPEAT IN 2 HOURS. MAX OF 2 DOSES IN 24 HOURS. What changed:  See the new instructions.   tiZANidine 2 MG tablet Commonly known as:  ZANAFLEX Take 1 tablet (2 mg total) by mouth every 8 (eight) hours as needed for muscle spasms.   topiramate 100 MG tablet Commonly known as:  TOPAMAX TAKE ONE TABLET BY MOUTH TWICE DAILY FOR  HEADACHE  PREVENTION What changed:    how much to take  how to take this  when to take this  additional instructions   Vitamin D3 125 MCG (5000 UT) Caps Take 5,000 Units by mouth daily.            Durable Medical Equipment  (From admission, onward)         Start     Ordered   03/24/18 0948  For home use only DME 3 n 1  Once     03/24/18 0947           Discharge Care Instructions  (From admission, onward)         Start     Ordered   03/24/18 0000  Weight bearing as tolerated    Question Answer Comment  Laterality left   Extremity Lower      03/24/18 1037          Diagnostic Studies: Dg Chest 2 View  Result Date: 03/12/2018 CLINICAL DATA:  Preop no cardiac history previous smoker EXAM: CHEST - 2 VIEW COMPARISON:  12/16/2016 FINDINGS: Postsurgical changes at the cervical spine. No acute opacity or pleural effusion. Stable cardiomediastinal silhouette. No pneumothorax. Scoliosis of the spine. Interval mild compression of thoracolumbar vertebra. IMPRESSION: No active cardiopulmonary disease.  Electronically Signed   By: Donavan Foil M.D.   On: 03/12/2018 14:42    Disposition: Discharge disposition: 01-Home or Self Care       Discharge Instructions    Call MD / Call 911   Complete by:  As directed    If you experience chest pain or shortness of breath, CALL 911 and be transported to the hospital emergency room.  If you develope a fever above 101 F, pus (white drainage) or increased drainage or redness at the wound, or calf pain, call your surgeon's office.   Diet general   Complete by:  As directed    Do not put a pillow under the knee. Place it under the heel.   Complete by:  As directed    Increase activity slowly as tolerated   Complete by:  As directed    Weight bearing as tolerated   Complete by:  As directed    Laterality:  left   Extremity:  Lower      Follow-up Information    Dorna Leitz, MD. Schedule an appointment as soon as possible for a visit in 2 weeks.   Specialty:  Orthopedic Surgery Contact information: [place] [address] [phone]            Signed: Erlene Senters 03/24/2018, 10:38 AM

## 2018-03-24 NOTE — Progress Notes (Signed)
Subjective: 2 Days Post-Op Procedure(s) (LRB): LEFT TOTAL KNEE ARTHROPLASTY (Left) Patient reports pain as mild.  Taking by mouth and voiding okay.  Nurses notes for scription of episodes of delusions/confusion.  Spoke with the husband at length about this and he states this has been going on but may be just a bit worse.  He plans on talking to her PCP and neurologist to have all evaluated her about adjusting her pre-operative medications. She is making good progress with physical therapy related to ambulation.  Objective: Vital signs in last 24 hours: Temp:  [97.8 F (36.6 C)-98 F (36.7 C)] 98 F (36.7 C) (12/08 0513) Pulse Rate:  [71-81] 71 (12/08 0513) Resp:  [15-16] 15 (12/08 0513) BP: (115-121)/(73-83) 121/83 (12/08 0513) SpO2:  [97 %-100 %] 97 % (12/08 0513)  Intake/Output from previous day: 12/07 0701 - 12/08 0700 In: 1190.1 [P.O.:120; I.V.:1070.1] Out: 325 [Urine:325] Intake/Output this shift: Total I/O In: -  Out: 500 [Urine:500]  Recent Labs    03/23/18 0351 03/24/18 0446  HGB 10.3* 9.7*   Recent Labs    03/23/18 0351 03/24/18 0446  WBC 12.3* 10.6*  RBC 3.41* 3.21*  HCT 32.3* 29.9*  PLT 210 193   Recent Labs    03/23/18 0351  NA 138  K 3.7  CL 110  CO2 21*  BUN 13  CREATININE 0.73  GLUCOSE 162*  CALCIUM 8.6*   No results for input(s): LABPT, INR in the last 72 hours. Left knee exam: Neurovascular intact Sensation intact distally Intact pulses distally Dorsiflexion/Plantar flexion intact Incision: dressing C/D/I Compartment soft  Anticipated LOS equal to or greater than 2 midnights due to - Age 7 and older with one or more of the following:  - Obesity  - Expected need for hospital services (PT, OT, Nursing) required for safe  discharge  - Anticipated need for postoperative skilled nursing care or inpatient rehab  - Active co-morbidities: Confusion/delusions. OR   - Unanticipated findings during/Post Surgery: Slow post-op progression:  GI, pain control, mobility  - Patient is a high risk of re-admission due to: None   Assessment/Plan: 2 Days Post-Op Procedure(s) (LRB): LEFT TOTAL KNEE ARTHROPLASTY (Left)  Plan: Weight-bear as tolerated on right. Spoke with husband about minimizing her oral pain medication. We will switch from oxycodone to hydrocodone. Up with therapy Discharge home with home health Follow-up with Dr. Berenice Primas in 10 to 14 days.     Erlene Senters 03/24/2018, 10:29 AM

## 2018-03-24 NOTE — Care Management Note (Signed)
Case Management Note  Patient Details  Name: Deborah [name] MRN: [id number] Date of Birth: Nov 01, 1945  Subjective/Objective:     Left TKA               Action/Plan: NCM spoke to pt and husband at bedside. Pt was confused at bedside. Husband answered questions. Offered choice for HH/CMS list provided/placed on chart. Pt has RW at home. Requested 3n1 bedside commode. Contacted [facility] and delivered to room. Contacted [facility] with new referral.   Expected Discharge Date:  03/24/18               Expected Discharge Plan:  [place]  In-House Referral:  NA  Discharge planning Services  NA  Post Acute Care Choice:  Home Health Choice offered to:  Spouse  DME Arranged:  3-N-1 DME Agency:  [place]:  PT [place]:  [facility]  Status of Service:  Completed, signed off  If discussed at [employer], dates discussed:    Additional Comments:  Erenest Rasher, RN 03/24/2018, 10:46 AM

## 2018-03-24 NOTE — Progress Notes (Signed)
Patients mentation remains inappropriate, periods of confusion, delayed responses, and sometimes "flight of ideas" type speech. Patient able to tell me where she is after being asked repeatedly but was unable to tell me the correct month and day. Was also speaking of her birthday stating "I'm turning 9" followed by a statement "My sister might be here..or maybe she is not my sister" Patient slept very little throughout the night and also removed her ace wrap and picked off the webril padding from around her operative site and had it laying in a pile beside her. Aquacel remains CDI, encouraged patient not to remove it. Will continue to monitor

## 2018-03-24 NOTE — Progress Notes (Signed)
RN reviewed discharge instructions with patient and husband. Husband, Remo Lipps will be primary caregiver and states understanding of blood thinner regimen, pain medication, proper positioning.   Paperwork and prescriptions given.   NT rolled patient down with all belongings to family car.

## 2018-03-24 NOTE — Anesthesia Postprocedure Evaluation (Signed)
Anesthesia Post Note  Patient: Deborah [name]  Procedure(s) Performed: LEFT TOTAL KNEE ARTHROPLASTY (Left Knee)     Patient location during evaluation: PACU Anesthesia Type: Spinal Level of consciousness: oriented and awake and alert Pain management: pain level controlled Vital Signs Assessment: post-procedure vital signs reviewed and stable Respiratory status: spontaneous breathing, respiratory function stable and patient connected to nasal cannula oxygen Cardiovascular status: blood pressure returned to baseline and stable Postop Assessment: no headache, no backache and no apparent nausea or vomiting Anesthetic complications: no    Last Vitals:  Vitals:   03/24/18 0513 03/24/18 1200  BP: 121/83 (!) 90/55  Pulse: 71 66  Resp: 15   Temp: 36.7 C   SpO2: 97%     Last Pain:  Vitals:   03/24/18 0513  TempSrc: Oral  PainSc:                  Merdis Snodgrass

## 2018-03-24 NOTE — Progress Notes (Signed)
Physical Therapy Treatment Patient Details Name: Deborah [name] MRN: [id number] DOB: 08-Aug-1945 Today's Date: 03/24/2018    History of Present Illness Pt s/p L TKR and with hx of TIA, Sz and c6-7 fusion    PT Comments    Pt very cooperative and progressing with mobility but continues easily confused and intermittently unsteady with gait and with questionable safety awareness.  Spouse present to observe.  Follow Up Recommendations  Home health PT;Follow surgeon's recommendation for DC plan and follow-up therapies     Equipment Recommendations  None recommended by PT    Recommendations for Other Services       Precautions / Restrictions Precautions Precautions: Fall Restrictions Weight Bearing Restrictions: No Other Position/Activity Restrictions: WBAT    Mobility  Bed Mobility               General bed mobility comments: Pt up in bathroom with nursing  Transfers Overall transfer level: Needs assistance Equipment used: Rolling walker (2 wheeled) Transfers: Sit to/from Stand Sit to Stand: Min assist         General transfer comment: cues for LE management and use of UEs to self assist; physical assist to bring wt up and fwd and to balance in initial standing  Ambulation/Gait Ambulation/Gait assistance: Min assist Gait Distance (Feet): 75 Feet Assistive device: Rolling walker (2 wheeled) Gait Pattern/deviations: Step-to pattern;Step-through pattern;Decreased step length - right;Decreased step length - left;Shuffle;Trunk flexed;Narrow base of support Gait velocity: decr   General Gait Details: cues for initial sequence, to increase BOS, posture, safety awareness and position from RW.  Physical assist for balance, support and RW management with pt intermittently unsteady or taking hand off walker   Stairs             Wheelchair Mobility    Modified Rankin (Stroke Patients Only)       Balance Overall balance assessment: Needs  assistance Sitting-balance support: No upper extremity supported;Feet supported Sitting balance-Leahy Scale: Good     Standing balance support: Bilateral upper extremity supported Standing balance-Leahy Scale: Poor                              Cognition Arousal/Alertness: Awake/alert Behavior During Therapy: Flat affect Overall Cognitive Status: Impaired/Different from baseline Area of Impairment: Problem solving                             Problem Solving: Slow processing General Comments: Delayed response, slow processing, very literal interpretation       Exercises Total Joint Exercises Ankle Circles/Pumps: AROM;Both;15 reps;Supine Quad Sets: AROM;Both;10 reps;Supine Heel Slides: AAROM;Left;Supine;15 reps Straight Leg Raises: AAROM;Left;Supine;15 reps    General Comments        Pertinent Vitals/Pain Pain Assessment: Faces Faces Pain Scale: Hurts little more Pain Location: L knee Pain Descriptors / Indicators: Aching;Sore Pain Intervention(s): Limited activity within patient's tolerance;Monitored during session;Premedicated before session    Home Living                      Prior Function            PT Goals (current goals can now be found in the care plan section) Acute Rehab PT Goals Patient Stated Goal: Home PT Goal Formulation: With patient Time For Goal Achievement: 03/30/18 Potential to Achieve Goals: Good Progress towards PT goals: Progressing toward goals    Frequency  7X/week      PT Plan Current plan remains appropriate    Co-evaluation              AM-PAC PT "6 Clicks" Mobility   Outcome Measure  Help needed turning from your back to your side while in a flat bed without using bedrails?: A Little Help needed moving from lying on your back to sitting on the side of a flat bed without using bedrails?: A Little Help needed moving to and from a bed to a chair (including a wheelchair)?: A  Little Help needed standing up from a chair using your arms (e.g., wheelchair or bedside chair)?: A Little Help needed to walk in hospital room?: A Little Help needed climbing 3-5 steps with a railing? : A Little 6 Click Score: 18    End of Session Equipment Utilized During Treatment: Gait belt Activity Tolerance: Patient tolerated treatment well Patient left: in chair;with call bell/phone within reach;with family/visitor present Nurse Communication: Mobility status PT Visit Diagnosis: Difficulty in walking, not elsewhere classified (R26.2)     Time: 1117-3567 PT Time Calculation (min) (ACUTE ONLY): 36 min  Charges:  $Gait Training: 8-22 mins $Therapeutic Exercise: 8-22 mins                     [place] Pager [phone] Office [phone]    Brekyn Huntoon 03/24/2018, 2:44 PM

## 2018-03-24 NOTE — Progress Notes (Signed)
Physical Therapy Treatment Patient Details Name: Deborah [name] MRN: [id number] DOB: 1945/12/27 Today's Date: 03/24/2018    History of Present Illness Pt s/p L TKR and with hx of TIA, Sz and c6-7 fusion  Pt very cooperative and progressing with mobility but continues easily confused and intermittently unsteady with gait and with questionable safety awareness.  Spouse present and advised to utilize gait belt 2* pt instability.  Pt's spouse states he feels comfortable managing at home with pt in current condition.  PT Comments       Follow Up Recommendations  Home health PT;Follow surgeon's recommendation for DC plan and follow-up therapies     Equipment Recommendations  None recommended by PT    Recommendations for Other Services       Precautions / Restrictions Precautions Precautions: Fall Restrictions Weight Bearing Restrictions: No Other Position/Activity Restrictions: WBAT    Mobility  Bed Mobility               General bed mobility comments: Pt up in chair and requests back to same  Transfers Overall transfer level: Needs assistance Equipment used: Rolling walker (2 wheeled) Transfers: Sit to/from Stand Sit to Stand: Min assist;Mod assist         General transfer comment: cues for LE management and use of UEs to self assist; physical assist to bring wt up and fwd and to balance in initial standing.  Pt confused about using R LE with KI in place  Ambulation/Gait Ambulation/Gait assistance: Min assist Gait Distance (Feet): 50 Feet Assistive device: Rolling walker (2 wheeled) Gait Pattern/deviations: Step-to pattern;Step-through pattern;Decreased step length - right;Decreased step length - left;Shuffle;Trunk flexed;Narrow base of support Gait velocity: decr   General Gait Details: cues for initial sequence, to increase BOS, posture, safety awareness and position from RW.  Physical assist for balance, support and RW management with pt intermittently  unsteady or taking hand off walker   Stairs Stairs: Yes   Stair Management: No rails;Step to pattern;Forwards;With walker Number of Stairs: 2 General stair comments: single step twice with RW and cues for sequence and foot/RW placement   Wheelchair Mobility    Modified Rankin (Stroke Patients Only)       Balance Overall balance assessment: Needs assistance Sitting-balance support: No upper extremity supported;Feet supported Sitting balance-Leahy Scale: Good     Standing balance support: Bilateral upper extremity supported Standing balance-Leahy Scale: Poor                              Cognition Arousal/Alertness: Awake/alert Behavior During Therapy: Flat affect Overall Cognitive Status: Impaired/Different from baseline(Spouse admits to pt memory deficits but vague on current con) Area of Impairment: Problem solving                             Problem Solving: Slow processing General Comments: Delayed response, slow processing, very literal interpretation       Exercises Total Joint Exercises Ankle Circles/Pumps: AROM;Both;15 reps;Supine Quad Sets: AROM;Both;10 reps;Supine Heel Slides: AAROM;Left;Supine;15 reps Straight Leg Raises: AAROM;Left;Supine;15 reps    General Comments        Pertinent Vitals/Pain Pain Assessment: Faces Faces Pain Scale: Hurts little more Pain Location: L knee Pain Descriptors / Indicators: Aching;Sore Pain Intervention(s): Limited activity within patient's tolerance;Monitored during session;Premedicated before session;Ice applied    Home Living  Prior Function            PT Goals (current goals can now be found in the care plan section) Acute Rehab PT Goals Patient Stated Goal: Home PT Goal Formulation: With patient Time For Goal Achievement: 03/30/18 Potential to Achieve Goals: Good Progress towards PT goals: Progressing toward goals    Frequency    7X/week       PT Plan Current plan remains appropriate    Co-evaluation              AM-PAC PT "6 Clicks" Mobility   Outcome Measure  Help needed turning from your back to your side while in a flat bed without using bedrails?: A Little Help needed moving from lying on your back to sitting on the side of a flat bed without using bedrails?: A Little Help needed moving to and from a bed to a chair (including a wheelchair)?: A Little Help needed standing up from a chair using your arms (e.g., wheelchair or bedside chair)?: A Little Help needed to walk in hospital room?: A Little Help needed climbing 3-5 steps with a railing? : A Little 6 Click Score: 18    End of Session Equipment Utilized During Treatment: Gait belt;Left knee immobilizer Activity Tolerance: Patient tolerated treatment well Patient left: in chair;with call bell/phone within reach;with family/visitor present Nurse Communication: Mobility status PT Visit Diagnosis: Difficulty in walking, not elsewhere classified (R26.2)     Time: 4235-3614 PT Time Calculation (min) (ACUTE ONLY): 21 min  Charges:  $Gait Training: 8-22 mins $Therapeutic Exercise: 8-22 mins                     Debe Coder PT Acute Rehabilitation Services Pager [phone] Office [phone]    Sybol Morre 03/24/2018, 2:52 PM

## 2018-03-25 ENCOUNTER — Encounter (HOSPITAL_COMMUNITY): Payer: Self-pay

## 2018-03-25 ENCOUNTER — Emergency Department (HOSPITAL_COMMUNITY): Payer: Medicare Other

## 2018-03-25 ENCOUNTER — Other Ambulatory Visit: Payer: Self-pay

## 2018-03-25 ENCOUNTER — Observation Stay (HOSPITAL_COMMUNITY)
Admission: EM | Admit: 2018-03-25 | Discharge: 2018-03-26 | Disposition: A | Discharge status: Home / Self Care | Payer: Medicare Other | Attending: Internal Medicine | Admitting: Internal Medicine

## 2018-03-25 ENCOUNTER — Observation Stay (HOSPITAL_COMMUNITY): Payer: Medicare Other

## 2018-03-25 DIAGNOSIS — Z79899 Other long term (current) drug therapy: Secondary | ICD-10-CM | POA: Insufficient documentation

## 2018-03-25 DIAGNOSIS — E785 Hyperlipidemia, unspecified: Secondary | ICD-10-CM | POA: Insufficient documentation

## 2018-03-25 DIAGNOSIS — Z87891 Personal history of nicotine dependence: Secondary | ICD-10-CM | POA: Diagnosis not present

## 2018-03-25 DIAGNOSIS — G43109 Migraine with aura, not intractable, without status migrainosus: Secondary | ICD-10-CM | POA: Insufficient documentation

## 2018-03-25 DIAGNOSIS — Z8673 Personal history of transient ischemic attack (TIA), and cerebral infarction without residual deficits: Secondary | ICD-10-CM | POA: Diagnosis not present

## 2018-03-25 DIAGNOSIS — Z7982 Long term (current) use of aspirin: Secondary | ICD-10-CM | POA: Insufficient documentation

## 2018-03-25 DIAGNOSIS — E876 Hypokalemia: Secondary | ICD-10-CM | POA: Diagnosis not present

## 2018-03-25 DIAGNOSIS — R55 Syncope and collapse: Secondary | ICD-10-CM | POA: Diagnosis not present

## 2018-03-25 DIAGNOSIS — Z96652 Presence of left artificial knee joint: Secondary | ICD-10-CM | POA: Insufficient documentation

## 2018-03-25 DIAGNOSIS — F411 Generalized anxiety disorder: Secondary | ICD-10-CM | POA: Diagnosis not present

## 2018-03-25 DIAGNOSIS — E782 Mixed hyperlipidemia: Secondary | ICD-10-CM

## 2018-03-25 HISTORY — DX: Syncope and collapse: R55

## 2018-03-25 LAB — CBC WITH DIFFERENTIAL/PLATELET
Abs Immature Granulocytes: 0.03 10*3/uL (ref 0.00–0.07)
BASOS ABS: 0.1 10*3/uL (ref 0.0–0.1)
Basophils Relative: 1 %
Eosinophils Absolute: 0.1 10*3/uL (ref 0.0–0.5)
Eosinophils Relative: 1 %
HCT: 34.9 % — ABNORMAL LOW (ref 36.0–46.0)
Hemoglobin: 11 g/dL — ABNORMAL LOW (ref 12.0–15.0)
Immature Granulocytes: 0 %
LYMPHS PCT: 13 %
Lymphs Abs: 1.1 10*3/uL (ref 0.7–4.0)
MCH: 29.6 pg (ref 26.0–34.0)
MCHC: 31.5 g/dL (ref 30.0–36.0)
MCV: 93.8 fL (ref 80.0–100.0)
Monocytes Absolute: 0.9 10*3/uL (ref 0.1–1.0)
Monocytes Relative: 10 %
Neutro Abs: 6.4 10*3/uL (ref 1.7–7.7)
Neutrophils Relative %: 75 %
Platelets: 224 10*3/uL (ref 150–400)
RBC: 3.72 MIL/uL — ABNORMAL LOW (ref 3.87–5.11)
RDW: 12.6 % (ref 11.5–15.5)
WBC: 8.5 10*3/uL (ref 4.0–10.5)
nRBC: 0 % (ref 0.0–0.2)

## 2018-03-25 LAB — URINALYSIS, ROUTINE W REFLEX MICROSCOPIC
Bilirubin Urine: NEGATIVE
Glucose, UA: NEGATIVE mg/dL
Hgb urine dipstick: NEGATIVE
Ketones, ur: NEGATIVE mg/dL
Leukocytes, UA: NEGATIVE
Nitrite: NEGATIVE
Protein, ur: NEGATIVE mg/dL
SPECIFIC GRAVITY, URINE: 1.004 — AB (ref 1.005–1.030)
pH: 9 — ABNORMAL HIGH (ref 5.0–8.0)

## 2018-03-25 LAB — TYPE AND SCREEN
ABO/RH(D): A POS
ANTIBODY SCREEN: NEGATIVE

## 2018-03-25 LAB — D-DIMER, QUANTITATIVE (NOT AT ARMC): D DIMER QUANT: 4.29 ug{FEU}/mL — AB (ref 0.00–0.50)

## 2018-03-25 LAB — COMPREHENSIVE METABOLIC PANEL
ALT: 40 U/L (ref 0–44)
AST: 64 U/L — ABNORMAL HIGH (ref 15–41)
Albumin: 3.3 g/dL — ABNORMAL LOW (ref 3.5–5.0)
Alkaline Phosphatase: 69 U/L (ref 38–126)
Anion gap: 11 (ref 5–15)
BUN: 5 mg/dL — ABNORMAL LOW (ref 8–23)
CHLORIDE: 109 mmol/L (ref 98–111)
CO2: 21 mmol/L — ABNORMAL LOW (ref 22–32)
CREATININE: 0.82 mg/dL (ref 0.44–1.00)
Calcium: 8.7 mg/dL — ABNORMAL LOW (ref 8.9–10.3)
GFR calc Af Amer: >60 mL/min (ref >60)
GFR calc non Af Amer: >60 mL/min (ref >60)
Glucose, Bld: 115 mg/dL — ABNORMAL HIGH (ref 70–99)
Potassium: 3.1 mmol/L — ABNORMAL LOW (ref 3.5–5.1)
Sodium: 141 mmol/L (ref 135–145)
Total Bilirubin: 0.8 mg/dL (ref 0.3–1.2)
Total Protein: 6.1 g/dL — ABNORMAL LOW (ref 6.5–8.1)

## 2018-03-25 LAB — I-STAT TROPONIN, ED: TROPONIN I, POC: 0.01 ng/mL (ref 0.00–0.08)

## 2018-03-25 LAB — TROPONIN I: Troponin I: <0.03 ng/mL (ref <0.03)

## 2018-03-25 MED ORDER — IOPAMIDOL (ISOVUE-370) INJECTION 76%
100.0000 mL | Freq: Once | INTRAVENOUS | Status: AC | PRN
  Administered 2018-03-25: 100 mL via INTRAVENOUS

## 2018-03-25 MED ORDER — CYCLOBENZAPRINE HCL 10 MG PO TABS: 5.0000 mg | ORAL_TABLET | Freq: Three times a day (TID) | ORAL | Status: DC | PRN

## 2018-03-25 MED ORDER — ALPRAZOLAM 0.25 MG PO TABS: 0.2500 mg | ORAL_TABLET | Freq: Three times a day (TID) | ORAL | Status: DC | PRN

## 2018-03-25 MED ORDER — EZETIMIBE 10 MG PO TABS
10.0000 mg | ORAL_TABLET | Freq: Every day | ORAL | Status: DC
  Administered 2018-03-25 – 2018-03-26 (×2): 10 mg via ORAL
  Filled 2018-03-25 (×2): qty 1

## 2018-03-25 MED ORDER — ONDANSETRON HCL 4 MG/2ML IJ SOLN: 4.0000 mg | Freq: Four times a day (QID) | INTRAMUSCULAR | Status: DC | PRN

## 2018-03-25 MED ORDER — LORAZEPAM 2 MG/ML IJ SOLN: 0.5000 mg | INTRAMUSCULAR | Status: AC

## 2018-03-25 MED ORDER — POTASSIUM CHLORIDE CRYS ER 20 MEQ PO TBCR
40.0000 meq | EXTENDED_RELEASE_TABLET | Freq: Once | ORAL | Status: AC
  Administered 2018-03-25: 40 meq via ORAL
  Filled 2018-03-25: qty 2

## 2018-03-25 MED ORDER — METOCLOPRAMIDE HCL 10 MG PO TABS: 10.0000 mg | ORAL_TABLET | Freq: Four times a day (QID) | ORAL | Status: DC | PRN

## 2018-03-25 MED ORDER — ESCITALOPRAM OXALATE 10 MG PO TABS
10.0000 mg | ORAL_TABLET | Freq: Every day | ORAL | Status: DC
  Administered 2018-03-26: 10 mg via ORAL
  Filled 2018-03-25: qty 1

## 2018-03-25 MED ORDER — DIVALPROEX SODIUM 250 MG PO DR TAB
250.0000 mg | DELAYED_RELEASE_TABLET | Freq: Every day | ORAL | Status: DC
  Administered 2018-03-26: 250 mg via ORAL
  Filled 2018-03-25: qty 1

## 2018-03-25 MED ORDER — FAMOTIDINE 20 MG PO TABS
40.0000 mg | ORAL_TABLET | Freq: Every day | ORAL | Status: DC
  Administered 2018-03-26: 40 mg via ORAL
  Filled 2018-03-25: qty 2

## 2018-03-25 MED ORDER — ROSUVASTATIN CALCIUM 20 MG PO TABS
20.0000 mg | ORAL_TABLET | Freq: Every day | ORAL | Status: DC
  Administered 2018-03-25 – 2018-03-26 (×2): 20 mg via ORAL
  Filled 2018-03-25 (×2): qty 1

## 2018-03-25 MED ORDER — ESCITALOPRAM OXALATE 10 MG PO TABS: 10.0000 mg | ORAL_TABLET | Freq: Every day | ORAL | Status: DC

## 2018-03-25 MED ORDER — ENOXAPARIN SODIUM 40 MG/0.4ML ~~LOC~~ SOLN
40.0000 mg | SUBCUTANEOUS | Status: DC
  Administered 2018-03-25 – 2018-03-26 (×2): 40 mg via SUBCUTANEOUS
  Filled 2018-03-25 (×2): qty 0.4

## 2018-03-25 MED ORDER — ASPIRIN EC 325 MG PO TBEC
325.0000 mg | DELAYED_RELEASE_TABLET | Freq: Two times a day (BID) | ORAL | Status: DC
  Administered 2018-03-25 – 2018-03-26 (×2): 325 mg via ORAL
  Filled 2018-03-25 (×2): qty 1

## 2018-03-25 MED ORDER — SODIUM CHLORIDE 0.9 % IV BOLUS
1000.0000 mL | Freq: Once | INTRAVENOUS | Status: AC
  Administered 2018-03-25: 1000 mL via INTRAVENOUS

## 2018-03-25 MED ORDER — ONDANSETRON HCL 4 MG PO TABS: 4.0000 mg | ORAL_TABLET | Freq: Four times a day (QID) | ORAL | Status: DC | PRN

## 2018-03-25 MED ORDER — HYDROCODONE-ACETAMINOPHEN 5-325 MG PO TABS: 1.0000 | ORAL_TABLET | Freq: Four times a day (QID) | ORAL | Status: DC | PRN

## 2018-03-25 MED ORDER — FLUTICASONE PROPIONATE 50 MCG/ACT NA SUSP: 2.0000 | Freq: Every day | NASAL | Status: DC | PRN

## 2018-03-25 MED ORDER — TOPIRAMATE 100 MG PO TABS
100.0000 mg | ORAL_TABLET | Freq: Two times a day (BID) | ORAL | Status: DC
  Administered 2018-03-25 – 2018-03-26 (×2): 100 mg via ORAL
  Filled 2018-03-25 (×3): qty 1

## 2018-03-25 MED ORDER — DOCUSATE SODIUM 100 MG PO CAPS
100.0000 mg | ORAL_CAPSULE | Freq: Two times a day (BID) | ORAL | Status: DC
  Administered 2018-03-25 – 2018-03-26 (×2): 100 mg via ORAL
  Filled 2018-03-25 (×2): qty 1

## 2018-03-25 MED ORDER — TIZANIDINE HCL 4 MG PO TABS: 2.0000 mg | ORAL_TABLET | Freq: Three times a day (TID) | ORAL | Status: DC | PRN

## 2018-03-25 NOTE — ED Notes (Signed)
Pt eating lunch

## 2018-03-25 NOTE — ED Provider Notes (Signed)
[facility] EMERGENCY DEPARTMENT Provider Note   CSN: [id number] Arrival date & time: 03/25/18  0745     History   Chief Complaint Chief Complaint  Patient presents with  . Loss of Consciousness    HPI Deborah [name] is a 72 y.o. female.  HPI  72 year old female presents with syncope.  The history is somewhat limited as the patient is a little confused about what exactly happened this morning.  She remembers having her knee operated on recently and getting out of the hospital yesterday.  She has not been having any fevers.  This morning she had breakfast and then went to go to the bathroom.  She fell because of her left leg hurting at the knee.  While she was on the floor, she had an argument with her husband.  At some point during the argument, she passed out.  She does not remember any prodromal symptoms.  She is not sure if she hit her head when she fell.  She did have a headache briefly on the EMS transport that has resolved.  Never had any chest pain or shortness of breath.  EMS noted her blood pressure to be in the 70s when she had been stood up and they gave her 400 cc IV fluid.  1:52 PM I spoke to the husband who has arrived.  The patient has been acting "goofy" and talking out of her head for a couple days, including when she was in the hospital.  Thought it might be pain medicine related.  Was acting goofy again this morning and then try to go to the bathroom.  She was using the walker but then seemed to get weak and collapsed down to the ground.  She did not pass out at that time but when he tried to bring her back to the bed she did briefly pass out.  She had a similar altered mental state a few years ago that was described as a possible TIA.  Past Medical History:  Diagnosis Date  . Allergy   . Anemia   . Anxiety   . Aortic atherosclerosis ([place]) 11/2016   Noted on CT abd  . Arthritis    hands and knees  . Bilateral cataracts    History of  .  Cellulitis    Right finger  . Cerebral amyloid angiopathy ([facility])   . Cervical spondylosis 12/2008   Noted on Cervical spine  . Chronic nausea   . Compression fracture of T12 vertebra (HCC) 06/07/2017   Mild  . DDD (degenerative disc disease), cervical 12/2008   Noted on Cervical spine  . Depression   . Encephalopathy 01/02/2017   Mild, [place] EEG  . GERD (gastroesophageal reflux disease)   . Helicobacter pylori infection 2018  . History of appendicitis    age 21  . History of colon polyps   . History of confusion   . History of hiatal hernia 10/16/2016   Noted on Endoscopy  . Hyperlipidemia   . Internal hemorrhoid 08/02/2017   Noted on Colonoscopy  . Low blood pressure reading   . Migraines   . Osteopenia   . PONV (postoperative nausea and vomiting)   . Pre-diabetes   . RLS (restless legs syndrome)   . Seizure ([place])    atypical partial comples vs. confusional migraines  . Skin cancer    right lower eyelid  . Squamous cell skin cancer    history of neck and upper back  . TIA (transient  ischemic attack)    questionable per patient  . Vitamin D deficiency     Patient Active Problem List   Diagnosis Date Noted  . Syncope 03/25/2018  . Delusions ([place]) 03/24/2018  . Primary osteoarthritis of left knee 03/22/2018  . GERD (gastroesophageal reflux disease) 02/25/2018  . Osteopenia 02/25/2018  . Compression fracture of T12 vertebra ([place]) 05/30/2017  . Aortic atherosclerosis ([place]) 02/03/2017  . Seizure disorder ([place]) 12/15/2016  . History of TIA (transient ischemic attack) 12/15/2016  . BMI 24.0-24.9, adult 03/12/2015  . Skin cancer 08/06/2014  . Migraine 08/06/2014  . Generalized anxiety disorder 08/06/2014  . Incontinence in female 08/06/2014  . Prediabetes 07/01/2013  . Medication management 07/01/2013  . Hypertension   . Hyperlipidemia   . Vitamin D deficiency     Past Surgical History:  Procedure Laterality Date  . APPENDECTOMY  1959  . CATARACT  EXTRACTION, BILATERAL Bilateral 2014  . [place]  . COLONOSCOPY W/ POLYPECTOMY  08/02/2017  . KNEE ARTHROSCOPY Bilateral 04/2014   [facility]  . MOHS SURGERY Right 04/2013   Squamous cell cacinoma, neck/upper back  . SPINE SURGERY  2010   C6-C7 fusion  . TOTAL KNEE ARTHROPLASTY Left 03/22/2018   Procedure: LEFT TOTAL KNEE ARTHROPLASTY;  Surgeon: Dorna Leitz, MD;  Location: [facility];  Service: Orthopedics;  Laterality: Left;  Adductor Block  . UPPER GI ENDOSCOPY  10/16/2016     OB History   None      Home Medications    Prior to Admission medications   Medication Sig Start Date End Date Taking? Authorizing Provider  ALPRAZolam Duanne Moron) 0.5 MG tablet Take 1/2 to 1 tablet 2 to 3 x /day ONLY as needed to rescue Panic / Anxiety attacks Patient taking differently: Take 0.25-0.5 mg by mouth 3 (three) times daily as needed for anxiety.  02/02/18  Yes Unk Pinto, MD  aspirin EC 325 MG tablet Take 1 tablet (325 mg total) by mouth 2 (two) times daily after a meal. Take x 1 month post op to decrease risk of blood clots. Patient taking differently: Take 325 mg by mouth as needed for mild pain.  03/22/18  Yes Gary Fleet, PA-C  Biotin 10 MG CAPS Take 10 mg by mouth daily.    Yes [provider]  Cholecalciferol (VITAMIN D3) 125 MCG (5000 UT) CAPS Take 5,000 Units by mouth daily.   Yes [provider]  cyclobenzaprine (FLEXERIL) 10 MG tablet Take 0.5 tablets (5 mg total) by mouth 3 (three) times daily as needed for muscle spasms. 05/29/17  Yes Liane Comber, NP  divalproex (DEPAKOTE) 250 MG DR tablet Take 1 tablet daily or as directed for headaches and seizures Patient taking differently: Take 250 mg by mouth daily.  05/29/17 03/25/18 Yes Liane Comber, NP  docusate sodium (COLACE) 100 MG capsule Take 1 capsule (100 mg total) by mouth 2 (two) times daily. 03/22/18  Yes Gary Fleet, PA-C  escitalopram (LEXAPRO) 20 MG tablet TAKE 1/2 TO 1 (ONE-HALF TO ONE)  TABLET BY MOUTH ONCE DAILY PREVENTION  OF  CHRONIC  ANXIETY  AND  PANIC  ATTACKS Patient taking differently: Take 10 mg by mouth daily.  11/06/17  Yes Liane Comber, NP  ezetimibe (ZETIA) 10 MG tablet TAKE 1 TABLET BY MOUTH ONCE DAILY FOR CHOLESTEROL Patient taking differently: Take 10 mg by mouth daily.  09/04/17  Yes Unk Pinto, MD  famotidine (PEPCID) 40 MG tablet Take 40 mg by mouth daily.  11/20/16  Yes [provider]  fluticasone (FLONASE) 50 MCG/ACT nasal spray Place 2 sprays into both nostrils daily as needed for allergies. 06/11/17  Yes Unk Pinto, MD  HYDROcodone-acetaminophen (NORCO) 5-325 MG tablet Take 1-2 tablets by mouth every 6 (six) hours as needed for severe pain. 03/24/18  Yes Gary Fleet, PA-C  metoCLOPramide (REGLAN) 10 MG tablet Take 1 tablet (10 mg total) by mouth every 6 (six) hours as needed for nausea (or headache). 75/64/33  Yes Delora Fuel, MD  Multiple Vitamins-Minerals (MULTIVITAMIN PO) Take 1 tablet by mouth daily.   Yes [provider]  rosuvastatin (CRESTOR) 20 MG tablet TAKE 1 TABLET BY MOUTH ONCE DAILY Patient taking differently: Take 20 mg by mouth daily.  11/13/17  Yes Unk Pinto, MD  SUMAtriptan (IMITREX) 20 MG/ACT nasal spray USE ONE DOSE IN THE NOSE NOW, MAY REPEAT IN 2 HOURS. MAX OF 2 DOSES IN 24 HOURS. Patient taking differently: Place 20 mg into the nose every 2 (two) hours as needed for migraine.  02/05/17  Yes Vicie Mutters, PA-C  tiZANidine (ZANAFLEX) 2 MG tablet Take 1 tablet (2 mg total) by mouth every 8 (eight) hours as needed for muscle spasms. 03/22/18  Yes Gary Fleet, PA-C  topiramate (TOPAMAX) 100 MG tablet TAKE ONE TABLET BY MOUTH TWICE DAILY FOR  HEADACHE  PREVENTION Patient taking differently: Take 100 mg by mouth 2 (two) times daily.  02/01/18  Yes Unk Pinto, MD    Family History Family History  Problem Relation Age of Onset  . Diabetes Mother   . Heart disease Mother   . Heart disease  Father   . Stroke Father   . Hypertension Father   . Hyperlipidemia Sister   . Hypertension Brother     Social History Social History   Tobacco Use  . Smoking status: Former Smoker    Packs/day: 1.00    Years: 4.00    Pack years: 4.00    Types: Cigarettes    Last attempt to quit: 03/25/1986    Years since quitting: 32.0  . Smokeless tobacco: Never Used  Substance Use Topics  . Alcohol use: Not Currently    Comment: occasional  . Drug use: No     Allergies   Banana; Chocolate; Effexor [venlafaxine]; Maxalt [rizatriptan benzoate]; Other; Tussin [guaifenesin]; and Zostavax [zoster vaccine live]   Review of Systems Review of Systems  Constitutional: Negative for fever.  Respiratory: Negative for shortness of breath.   Cardiovascular: Negative for chest pain.  Gastrointestinal: Negative for vomiting.  Genitourinary: Negative for dysuria.  Musculoskeletal: Positive for arthralgias and joint swelling.  Neurological: Positive for syncope and headaches. Negative for light-headedness.  All other systems reviewed and are negative.    Physical Exam Updated Vital Signs BP (!) 131/100   Pulse 97   Temp 98.3 F (36.8 C) (Oral)   Resp 11   Ht 5\' 7"  (1.702 m)   Wt 68 kg   SpO2 100%   BMI 23.49 kg/m   Physical Exam  Constitutional: She is oriented to person, place, and time. She appears well-developed and well-nourished. No distress.  HENT:  Head: Normocephalic and atraumatic.  Right Ear: External ear normal.  Left Ear: External ear normal.  Nose: Nose normal.  Eyes: Right eye exhibits no discharge. Left eye exhibits no discharge.  Cardiovascular: Normal heart sounds. An irregular rhythm present. Tachycardia present.  Pulmonary/Chest: Effort normal and breath sounds normal.  Abdominal: Soft. There is no tenderness.  Musculoskeletal:  Left knee and leg is diffusely swollen. Mildly  warm. No drainage  Neurological: She is alert and oriented to person, place, and time.    CN 3-12 grossly intact. 5/5 strength in all 4 extremities save for limited strength testing in left leg due to pain. Grossly normal sensation. Normal finger to nose.   Skin: Skin is warm and dry. She is not diaphoretic.  Psychiatric: Her mood appears not anxious.  Flat affect  Nursing note and vitals reviewed.    ED Treatments / Results  Labs (all labs ordered are listed, but only abnormal results are displayed) Labs Reviewed  COMPREHENSIVE METABOLIC PANEL - Abnormal; Notable for the following components:      Result Value   Potassium 3.1 (*)    CO2 21 (*)    Glucose, Bld 115 (*)    BUN 5 (*)    Calcium 8.7 (*)    Total Protein 6.1 (*)    Albumin 3.3 (*)    AST 64 (*)    All other components within normal limits  URINALYSIS, ROUTINE W REFLEX MICROSCOPIC - Abnormal; Notable for the following components:   Color, Urine STRAW (*)    Specific Gravity, Urine 1.004 (*)    pH 9.0 (*)    All other components within normal limits  CBC WITH DIFFERENTIAL/PLATELET - Abnormal; Notable for the following components:   RBC 3.72 (*)    Hemoglobin 11.0 (*)    HCT 34.9 (*)    All other components within normal limits  D-DIMER, QUANTITATIVE (NOT AT [facility]) - Abnormal; Notable for the following components:   D-Dimer, Quant 4.29 (*)    All other components within normal limits  URINE CULTURE  I-STAT TROPONIN, ED    EKG EKG Interpretation  Date/Time:  Monday March 25 2018 07:49:24 EST Ventricular Rate:  98 PR Interval:    QRS Duration: 89 QT Interval:  372 QTC Calculation: 475 R Axis:   12 Text Interpretation:  Sinus tachycardia Multiform ventricular premature complexes LAE, consider biatrial enlargement Confirmed by Sherwood Gambler [phone]) on 03/25/2018 8:09:13 AM   Radiology Ct Head Wo Contrast  Result Date: 03/25/2018 CLINICAL DATA:  Syncope.  Struck back of head. EXAM: CT HEAD WITHOUT CONTRAST TECHNIQUE: Contiguous axial images were obtained from the base of the skull through  the vertex without intravenous contrast. COMPARISON:  12/15/2016 FINDINGS: Brain: Mild global atrophy is appropriate to age. Chronic ischemic changes in the periventricular white matter are stable. There is no mass effect, midline shift, or acute intracranial hemorrhage. Vascular: No hyperdense vessel or unexpected calcification. Skull: Cranium is intact. Sinuses/Orbits: Visualized paranasal sinuses and mastoid air cells are clear. Visualized orbits are within normal limits. Other: Noncontributory. IMPRESSION: No acute intracranial pathology. Electronically Signed   By: Marybelle Killings M.D.   On: 03/25/2018 10:24   Ct Angio Chest Pe W/cm &/or Wo Cm  Result Date: 03/25/2018 CLINICAL DATA:  Shortness of breath and positive D-dimer study EXAM: CT ANGIOGRAPHY CHEST WITH CONTRAST TECHNIQUE: Multidetector CT imaging of the chest was performed using the standard protocol during bolus administration of intravenous contrast. Multiplanar CT image reconstructions and MIPs were obtained to evaluate the vascular anatomy. CONTRAST:  188mL ISOVUE-370 IOPAMIDOL (ISOVUE-370) INJECTION 76% COMPARISON:  Chest radiograph March 25, 2018 FINDINGS: Cardiovascular: There is no demonstrable pulmonary embolus. There is no thoracic aortic aneurysm or dissection. Visualized great vessels appear normal except for occasional foci of atherosclerotic calcification, not causing hemodynamically significant obstruction. There are foci of aortic atherosclerosis. There are foci of coronary artery calcification. There is no pericardial effusion  or pericardial thickening. There is mild left ventricular hypertrophy. Mediastinum/Nodes: There is a 6 mm nodular opacity in the right lobe of the thyroid. Thyroid otherwise appears unremarkable. No thoracic adenopathy is appreciable. No esophageal lesions are evident. Lungs/Pleura: There is mild bibasilar pleural thickening. There are areas of patchy atelectasis bilaterally. There is no appreciable edema or  consolidation. Upper Abdomen: There is upper abdominal aortic atherosclerosis. Visualized upper abdominal structures otherwise appear unremarkable. Musculoskeletal: There is anterior wedging of the T12 vertebral body. No blastic or lytic bone lesions are evident. There are no chest wall lesions evident. Review of the MIP images confirms the above findings. IMPRESSION: 1. No demonstrable pulmonary embolus. No thoracic aortic aneurysm or dissection. There are foci of aortic atherosclerosis as well as foci of coronary artery and great vessel calcification. 2.  Left ventricular hypertrophy. 3. Areas of atelectatic change bilaterally. No edema or consolidation. 4.  No appreciable adenopathy. 5. Subcentimeter nodular lesion right lobe of thyroid. Per consensus guidelines, a nodule of this small size does not warrant additional imaging surveillance. Aortic Atherosclerosis (ICD10-I70.0). Electronically Signed   By: Lowella Grip III M.D.   On: 03/25/2018 12:03   Dg Chest Portable 1 View  Result Date: 03/25/2018 CLINICAL DATA:  Syncope with fall EXAM: PORTABLE CHEST 1 VIEW COMPARISON:  March 12, 2018 FINDINGS: There is no evident edema or consolidation. The heart size and pulmonary vascularity are normal. No adenopathy. No pneumothorax. There is postoperative change in the lower cervical region. IMPRESSION: No edema or consolidation.  Stable cardiac silhouette. Electronically Signed   By: Lowella Grip III M.D.   On: 03/25/2018 08:57    Procedures Procedures (including critical care time)  Medications Ordered in ED Medications  potassium chloride SA (K-DUR,KLOR-CON) CR tablet 40 mEq (has no administration in time range)  sodium chloride 0.9 % bolus 1,000 mL (0 mLs Intravenous Stopped 03/25/18 1225)  iopamidol (ISOVUE-370) 76 % injection 100 mL (100 mLs Intravenous Contrast Given 03/25/18 1109)     Initial Impression / Assessment and Plan / ED Course  I have reviewed the triage vital signs and the  nursing notes.  Pertinent labs & imaging results that were available during my care of the patient were reviewed by me and considered in my medical decision making (see chart for details).     Patient's confusion is similar to prior on chart review.  However with the postop syncope, work-up for PE obtained and is negative.  She does have an irregular heart rate and multiple PACs.  While she has not had a specific arrhythmia, I think she will need work-up for her syncope with observation.  Hospitalist will admit.  No obvious acute infection at this time.  Final Clinical Impressions(s) / ED Diagnoses   Final diagnoses:  Syncope and collapse    ED Discharge Orders    None       Sherwood Gambler, MD 03/25/18 1352

## 2018-03-25 NOTE — H&P (Addendum)
[facility]                                                                                    Patient Demographics  Deborah [name], is a 72 y.o. female  CSN: [id number]  MRN: [id number]  DOB - 1945/11/14  Admit Date - 03/25/2018  Outpatient Primary MD for the patient is Unk Pinto, MD   With History of -  Past Medical History:  Diagnosis Date  . Allergy   . Anemia   . Anxiety   . Aortic atherosclerosis ([place]) 11/2016   Noted on CT abd  . Arthritis    hands and knees  . Bilateral cataracts    History of  . Cellulitis    Right finger  . Cerebral amyloid angiopathy ([facility])   . Cervical spondylosis 12/2008   Noted on Cervical spine  . Chronic nausea   . Compression fracture of T12 vertebra ([facility]) 06/07/2017   Mild  . DDD (degenerative disc disease), cervical 12/2008   Noted on Cervical spine  . Depression   . Encephalopathy 01/02/2017   Mild, [place] EEG  . GERD (gastroesophageal reflux disease)   . Helicobacter pylori infection 2018  . History of appendicitis    age 10  . History of colon polyps   . History of confusion   . History of hiatal hernia 10/16/2016   Noted on Endoscopy  . Hyperlipidemia   . Internal hemorrhoid 08/02/2017   Noted on Colonoscopy  . Low blood pressure reading   . Migraines   . Osteopenia   . PONV (postoperative nausea and vomiting)   . Pre-diabetes   . RLS (restless legs syndrome)   . Seizure ([place])    atypical partial comples vs. confusional migraines  . Skin cancer    right lower eyelid  . Squamous cell skin cancer    history of neck and upper back  . TIA (transient ischemic attack)    questionable per patient  . Vitamin D deficiency       Past Surgical History:  Procedure Laterality Date  . APPENDECTOMY  1959  . CATARACT EXTRACTION, BILATERAL Bilateral 2014  . [place]  . COLONOSCOPY W/ POLYPECTOMY  08/02/2017  . KNEE ARTHROSCOPY Bilateral 04/2014   [facility]  . MOHS SURGERY Right  04/2013   Squamous cell cacinoma, neck/upper back  . SPINE SURGERY  2010   C6-C7 fusion  . TOTAL KNEE ARTHROPLASTY Left 03/22/2018   Procedure: LEFT TOTAL KNEE ARTHROPLASTY;  Surgeon: Dorna Leitz, MD;  Location: [facility];  Service: Orthopedics;  Laterality: Left;  Adductor Block  . UPPER GI ENDOSCOPY  10/16/2016    in for   Chief Complaint  Patient presents with  . Loss of Consciousness     HPI  Deborah [name]  is a 72 y.o. female, with past medical history significant for complicated migraine headaches and a recent admission on Friday discharged yesterday for left total knee replacement presenting here with a syncopal episode while walking to the bathroom using a walker.  This was witnessed by her husband lasted basically for few seconds. "  She just became plump as a noodle" ; no  preceding chest pains or shortness of breath.  No associated nausea  ,cold sweats , or diarrhea. The patient has been followed by neurology with MRI done 1 year ago showing cerebral amyloid angiopathy. In the emergency room her CT of head and CT angiogram of the chest were both negative however the patient was noted to have frequent PVCs and her potassium was 3.1.    Review of Systems    In addition to the HPI above,  No Fever-chills, No Headache, No changes with Vision or hearing, No problems swallowing food or Liquids, No Chest pain, Cough or Shortness of Breath, No Abdominal pain, No Nausea or Vommitting, Bowel movements are regular, No Blood in stool or Urine, No dysuria, No new skin rashes or bruises, No new joints pains-aches,  No recent weight gain or loss, No polyuria, polydypsia or polyphagia, No significant Mental Stressors.  A full 10 point Review of Systems was done, except as stated above, all other Review of Systems were negative.   Social History Social History   Tobacco Use  . Smoking status: Former Smoker    Packs/day: 1.00    Years: 4.00    Pack years: 4.00    Types:  Cigarettes    Last attempt to quit: 03/25/1986    Years since quitting: 32.0  . Smokeless tobacco: Never Used  Substance Use Topics  . Alcohol use: Not Currently    Comment: occasional     Family History Family History  Problem Relation Age of Onset  . Diabetes Mother   . Heart disease Mother   . Heart disease Father   . Stroke Father   . Hypertension Father   . Hyperlipidemia Sister   . Hypertension Brother      Prior to Admission medications   Medication Sig Start Date End Date Taking? Authorizing Provider  ALPRAZolam Duanne Moron) 0.5 MG tablet Take 1/2 to 1 tablet 2 to 3 x /day ONLY as needed to rescue Panic / Anxiety attacks Patient taking differently: Take 0.25-0.5 mg by mouth 3 (three) times daily as needed for anxiety.  02/02/18  Yes Unk Pinto, MD  aspirin EC 325 MG tablet Take 1 tablet (325 mg total) by mouth 2 (two) times daily after a meal. Take x 1 month post op to decrease risk of blood clots. Patient taking differently: Take 325 mg by mouth as needed for mild pain.  03/22/18  Yes Gary Fleet, PA-C  Biotin 10 MG CAPS Take 10 mg by mouth daily.    Yes [provider]  Cholecalciferol (VITAMIN D3) 125 MCG (5000 UT) CAPS Take 5,000 Units by mouth daily.   Yes [provider]  cyclobenzaprine (FLEXERIL) 10 MG tablet Take 0.5 tablets (5 mg total) by mouth 3 (three) times daily as needed for muscle spasms. 05/29/17  Yes Liane Comber, NP  divalproex (DEPAKOTE) 250 MG DR tablet Take 1 tablet daily or as directed for headaches and seizures Patient taking differently: Take 250 mg by mouth daily.  05/29/17 03/25/18 Yes Liane Comber, NP  docusate sodium (COLACE) 100 MG capsule Take 1 capsule (100 mg total) by mouth 2 (two) times daily. 03/22/18  Yes Gary Fleet, PA-C  escitalopram (LEXAPRO) 20 MG tablet TAKE 1/2 TO 1 (ONE-HALF TO ONE) TABLET BY MOUTH ONCE DAILY PREVENTION  OF  CHRONIC  ANXIETY  AND  PANIC  ATTACKS Patient taking differently: Take 10 mg by  mouth daily.  11/06/17  Yes Liane Comber, NP  ezetimibe (ZETIA) 10 MG tablet TAKE 1 TABLET  BY MOUTH ONCE DAILY FOR CHOLESTEROL Patient taking differently: Take 10 mg by mouth daily.  09/04/17  Yes Unk Pinto, MD  famotidine (PEPCID) 40 MG tablet Take 40 mg by mouth daily.  11/20/16  Yes [provider]  fluticasone (FLONASE) 50 MCG/ACT nasal spray Place 2 sprays into both nostrils daily as needed for allergies. 06/11/17  Yes Unk Pinto, MD  HYDROcodone-acetaminophen (NORCO) 5-325 MG tablet Take 1-2 tablets by mouth every 6 (six) hours as needed for severe pain. 03/24/18  Yes Gary Fleet, PA-C  metoCLOPramide (REGLAN) 10 MG tablet Take 1 tablet (10 mg total) by mouth every 6 (six) hours as needed for nausea (or headache). 62/83/15  Yes Delora Fuel, MD  Multiple Vitamins-Minerals (MULTIVITAMIN PO) Take 1 tablet by mouth daily.   Yes [provider]  rosuvastatin (CRESTOR) 20 MG tablet TAKE 1 TABLET BY MOUTH ONCE DAILY Patient taking differently: Take 20 mg by mouth daily.  11/13/17  Yes Unk Pinto, MD  SUMAtriptan (IMITREX) 20 MG/ACT nasal spray USE ONE DOSE IN THE NOSE NOW, MAY REPEAT IN 2 HOURS. MAX OF 2 DOSES IN 24 HOURS. Patient taking differently: Place 20 mg into the nose every 2 (two) hours as needed for migraine.  02/05/17  Yes Vicie Mutters, PA-C  tiZANidine (ZANAFLEX) 2 MG tablet Take 1 tablet (2 mg total) by mouth every 8 (eight) hours as needed for muscle spasms. 03/22/18  Yes Gary Fleet, PA-C  topiramate (TOPAMAX) 100 MG tablet TAKE ONE TABLET BY MOUTH TWICE DAILY FOR  HEADACHE  PREVENTION Patient taking differently: Take 100 mg by mouth 2 (two) times daily.  02/01/18  Yes Unk Pinto, MD    Allergies  Allergen Reactions  . Banana Nausea And Vomiting  . Chocolate Other (See Comments)    Unknown  . Effexor [Venlafaxine] Other (See Comments)    insomnia  . Maxalt [Rizatriptan Benzoate] Nausea Only and Other (See Comments)    Nausea and  excessive salivation  . Other Other (See Comments)    NUTS (all kinds)  . Tussin [Guaifenesin] Rash  . Zostavax [Zoster Vaccine Live] Itching and Rash    Physical Exam  Vitals  Blood pressure (!) 131/100, pulse 97, temperature 98.3 F (36.8 C), temperature source Oral, resp. rate 11, height 5\' 7"  (1.702 m), weight 68 kg, SpO2 100 %.   1. General well-developed, well-nourished looks tired and slow to react  2. Normal affect and insight, Not Suicidal or Homicidal, Awake Alert, Oriented X 3.  3. No F.N deficits, grossly, patient moving all extremities.  4. Ears and Eyes appear Normal, Conjunctivae clear, PERRLA. Moist Oral Mucosa.  5. Supple Neck, No JVD, No cervical lymphadenopathy appriciated, No Carotid Bruits.  6. Symmetrical Chest wall movement, Good air movement bilaterally, CTAB.  7. RRR, No Gallops, Rubs or Murmurs, No Parasternal Heave.  8. Positive Bowel Sounds, Abdomen Soft, Non tender, No organomegaly appriciated,No rebound -guarding or rigidity.  9.  No Cyanosis, Normal Skin Turgor, No Skin Rash or Bruise.  10. Good muscle tone,  joints appear normal , no effusions, Normal ROM.    Data Review  CBC Recent Labs  Lab 03/23/18 0351 03/24/18 0446 03/25/18 0820  WBC 12.3* 10.6* 8.5  HGB 10.3* 9.7* 11.0*  HCT 32.3* 29.9* 34.9*  PLT 210 193 224  MCV 94.7 93.1 93.8  MCH 30.2 30.2 29.6  MCHC 31.9 32.4 31.5  RDW 12.1 12.3 12.6  LYMPHSABS  --   --  1.1  MONOABS  --   --  0.9  EOSABS  --   --  0.1  BASOSABS  --   --  0.1   ------------------------------------------------------------------------------------------------------------------  Chemistries  Recent Labs  Lab 03/23/18 0351 03/25/18 0820  NA 138 141  K 3.7 3.1*  CL 110 109  CO2 21* 21*  GLUCOSE 162* 115*  BUN 13 5*  CREATININE 0.73 0.82  CALCIUM 8.6* 8.7*  AST  --  64*  ALT  --  40  ALKPHOS  --  69  BILITOT  --  0.8    ------------------------------------------------------------------------------------------------------------------ estimated creatinine clearance is 61.2 mL/min (by C-G formula based on SCr of 0.82 mg/dL). ------------------------------------------------------------------------------------------------------------------ No results for input(s): TSH, T4TOTAL, T3FREE, THYROIDAB in the last 72 hours.  Invalid input(s): FREET3   Coagulation profile No results for input(s): INR, PROTIME in the last 168 hours. ------------------------------------------------------------------------------------------------------------------- Recent Labs    03/25/18 0820  DDIMER 4.29*   -------------------------------------------------------------------------------------------------------------------  Cardiac Enzymes No results for input(s): CKMB, TROPONINI, MYOGLOBIN in the last 168 hours.  Invalid input(s): CK ------------------------------------------------------------------------------------------------------------------ Invalid input(s): POCBNP   ---------------------------------------------------------------------------------------------------------------  Urinalysis    Component Value Date/Time   COLORURINE STRAW (A) 03/25/2018 0921   APPEARANCEUR CLEAR 03/25/2018 0921   LABSPEC 1.004 (L) 03/25/2018 0921   PHURINE 9.0 (H) 03/25/2018 0921   GLUCOSEU NEGATIVE 03/25/2018 0921   HGBUR NEGATIVE 03/25/2018 0921   BILIRUBINUR NEGATIVE 03/25/2018 0921   KETONESUR NEGATIVE 03/25/2018 0921   PROTEINUR NEGATIVE 03/25/2018 0921   UROBILINOGEN 0.2 10/12/2014 1029   NITRITE NEGATIVE 03/25/2018 0921   LEUKOCYTESUR NEGATIVE 03/25/2018 0921    ----------------------------------------------------------------------------------------------------------------  Imaging results:   Dg Chest 2 View  Result Date: 03/12/2018 CLINICAL DATA:  Preop no cardiac history previous smoker EXAM: CHEST - 2 VIEW  COMPARISON:  12/16/2016 FINDINGS: Postsurgical changes at the cervical spine. No acute opacity or pleural effusion. Stable cardiomediastinal silhouette. No pneumothorax. Scoliosis of the spine. Interval mild compression of thoracolumbar vertebra. IMPRESSION: No active cardiopulmonary disease. Electronically Signed   By: Donavan Foil M.D.   On: 03/12/2018 14:42   Ct Head Wo Contrast  Result Date: 03/25/2018 CLINICAL DATA:  Syncope.  Struck back of head. EXAM: CT HEAD WITHOUT CONTRAST TECHNIQUE: Contiguous axial images were obtained from the base of the skull through the vertex without intravenous contrast. COMPARISON:  12/15/2016 FINDINGS: Brain: Mild global atrophy is appropriate to age. Chronic ischemic changes in the periventricular white matter are stable. There is no mass effect, midline shift, or acute intracranial hemorrhage. Vascular: No hyperdense vessel or unexpected calcification. Skull: Cranium is intact. Sinuses/Orbits: Visualized paranasal sinuses and mastoid air cells are clear. Visualized orbits are within normal limits. Other: Noncontributory. IMPRESSION: No acute intracranial pathology. Electronically Signed   By: Marybelle Killings M.D.   On: 03/25/2018 10:24   Ct Angio Chest Pe W/cm &/or Wo Cm  Result Date: 03/25/2018 CLINICAL DATA:  Shortness of breath and positive D-dimer study EXAM: CT ANGIOGRAPHY CHEST WITH CONTRAST TECHNIQUE: Multidetector CT imaging of the chest was performed using the standard protocol during bolus administration of intravenous contrast. Multiplanar CT image reconstructions and MIPs were obtained to evaluate the vascular anatomy. CONTRAST:  165mL ISOVUE-370 IOPAMIDOL (ISOVUE-370) INJECTION 76% COMPARISON:  Chest radiograph March 25, 2018 FINDINGS: Cardiovascular: There is no demonstrable pulmonary embolus. There is no thoracic aortic aneurysm or dissection. Visualized great vessels appear normal except for occasional foci of atherosclerotic calcification, not causing  hemodynamically significant obstruction. There are foci of aortic atherosclerosis. There are foci of coronary artery calcification. There is no pericardial effusion or pericardial  thickening. There is mild left ventricular hypertrophy. Mediastinum/Nodes: There is a 6 mm nodular opacity in the right lobe of the thyroid. Thyroid otherwise appears unremarkable. No thoracic adenopathy is appreciable. No esophageal lesions are evident. Lungs/Pleura: There is mild bibasilar pleural thickening. There are areas of patchy atelectasis bilaterally. There is no appreciable edema or consolidation. Upper Abdomen: There is upper abdominal aortic atherosclerosis. Visualized upper abdominal structures otherwise appear unremarkable. Musculoskeletal: There is anterior wedging of the T12 vertebral body. No blastic or lytic bone lesions are evident. There are no chest wall lesions evident. Review of the MIP images confirms the above findings. IMPRESSION: 1. No demonstrable pulmonary embolus. No thoracic aortic aneurysm or dissection. There are foci of aortic atherosclerosis as well as foci of coronary artery and great vessel calcification. 2.  Left ventricular hypertrophy. 3. Areas of atelectatic change bilaterally. No edema or consolidation. 4.  No appreciable adenopathy. 5. Subcentimeter nodular lesion right lobe of thyroid. Per consensus guidelines, a nodule of this small size does not warrant additional imaging surveillance. Aortic Atherosclerosis (ICD10-I70.0). Electronically Signed   By: Lowella Grip III M.D.   On: 03/25/2018 12:03   Dg Chest Portable 1 View  Result Date: 03/25/2018 CLINICAL DATA:  Syncope with fall EXAM: PORTABLE CHEST 1 VIEW COMPARISON:  March 12, 2018 FINDINGS: There is no evident edema or consolidation. The heart size and pulmonary vascularity are normal. No adenopathy. No pneumothorax. There is postoperative change in the lower cervical region. IMPRESSION: No edema or consolidation.  Stable  cardiac silhouette. Electronically Signed   By: Lowella Grip III M.D.   On: 03/25/2018 08:57    My personal review of EKG: Sinus tach at 98 bpm with PVCs  Assessment & Plan  Syncopal episode Check serial troponins Check echocardiogram Neurochecks .  Check MRI of the brain and consider neurology consult if abnormal  History of complicated migraine headaches Followed by neurology.  Will hold Imitrex now and monitor  History of hyperlipidemia on Zetia and Crestor  Anxiety continue with Xanax and Lexapro  Amyloid angiopathy of the brain Followed by neurology as outpatient  Status post left total knee replacement on 03/22/2018 Continue with aspirin.  Hypokalemia Repleted in the emergency room, p.o.  DVT Prophylaxis Lovenox  AM Labs Ordered, also please review Full Orders  Family Communication: Admission, patients condition and plan of care including tests being ordered have been discussed with the patient and husband who indicate understanding and agree with the plan and Code Status.  Code Status full  Disposition Plan: Home  Time spent in minutes : 35 minutes  Condition GUARDED   @SIGNATURE @

## 2018-03-25 NOTE — ED Notes (Signed)
RN attempted to start IV.

## 2018-03-25 NOTE — ED Triage Notes (Signed)
Pt brought in by EMS due having a syncopal episode. Pt had recent knee surgery. Per EMS, SBP was in the 70's on arrival and 110's after 400cc of NS. Pt had some confusion after episode. Pt a&ox4.

## 2018-03-25 NOTE — Progress Notes (Signed)
Went down for MRI by bed with husband. MRI tech called and claimed that pt is claustrophobic. MD made aware and gave ativan order but tech called back that he will just give a call when ativan needed. Completed test with out ativan to be given .

## 2018-03-25 NOTE — ED Notes (Signed)
Pt states she is vegetarian

## 2018-03-25 NOTE — Progress Notes (Signed)
Admission from the ED by stretcher awake and alert. Left leg immobilizer in used.

## 2018-03-26 ENCOUNTER — Observation Stay (HOSPITAL_BASED_OUTPATIENT_CLINIC_OR_DEPARTMENT_OTHER): Payer: Medicare Other

## 2018-03-26 DIAGNOSIS — E785 Hyperlipidemia, unspecified: Secondary | ICD-10-CM | POA: Diagnosis not present

## 2018-03-26 DIAGNOSIS — R0602 Shortness of breath: Secondary | ICD-10-CM

## 2018-03-26 DIAGNOSIS — E876 Hypokalemia: Secondary | ICD-10-CM | POA: Diagnosis not present

## 2018-03-26 DIAGNOSIS — G43109 Migraine with aura, not intractable, without status migrainosus: Secondary | ICD-10-CM | POA: Diagnosis not present

## 2018-03-26 DIAGNOSIS — R55 Syncope and collapse: Secondary | ICD-10-CM | POA: Diagnosis not present

## 2018-03-26 LAB — BASIC METABOLIC PANEL
Anion gap: 10 (ref 5–15)
BUN: 5 mg/dL — ABNORMAL LOW (ref 8–23)
CO2: 19 mmol/L — ABNORMAL LOW (ref 22–32)
Calcium: 8.9 mg/dL (ref 8.9–10.3)
Chloride: 106 mmol/L (ref 98–111)
Creatinine, Ser: 0.68 mg/dL (ref 0.44–1.00)
GFR calc Af Amer: >60 mL/min (ref >60)
GFR calc non Af Amer: >60 mL/min (ref >60)
GLUCOSE: 107 mg/dL — AB (ref 70–99)
Potassium: 3.5 mmol/L (ref 3.5–5.1)
Sodium: 135 mmol/L (ref 135–145)

## 2018-03-26 LAB — ECHOCARDIOGRAM COMPLETE
HEIGHTINCHES: 69 in
Weight: 2490.32 oz

## 2018-03-26 LAB — URINE CULTURE

## 2018-03-26 LAB — MAGNESIUM: Magnesium: 1.9 mg/dL (ref 1.7–2.4)

## 2018-03-26 LAB — TROPONIN I
Troponin I: <0.03 ng/mL (ref <0.03)
Troponin I: <0.03 ng/mL (ref <0.03)

## 2018-03-26 MED ORDER — POTASSIUM CHLORIDE CRYS ER 20 MEQ PO TBCR
40.0000 meq | EXTENDED_RELEASE_TABLET | ORAL | Status: AC
  Administered 2018-03-26 (×2): 40 meq via ORAL
  Filled 2018-03-26 (×2): qty 2

## 2018-03-26 MED ORDER — METOPROLOL TARTRATE 12.5 MG HALF TABLET
12.5000 mg | ORAL_TABLET | Freq: Two times a day (BID) | ORAL | Status: DC
  Administered 2018-03-26: 12.5 mg via ORAL
  Filled 2018-03-26: qty 1

## 2018-03-26 MED ORDER — METOPROLOL TARTRATE 25 MG PO TABS: 12.5000 mg | ORAL_TABLET | Freq: Two times a day (BID) | ORAL | 0 refills | Status: DC

## 2018-03-26 NOTE — Care Management Obs Status (Signed)
[facility] NOTIFICATION   Patient Details  Name: Deborah [name] MRN: [id number] Date of Birth: Jul 16, 1945   Medicare Observation Status Notification Given:  Yes   MOON letter communicated with pts husband [name] via phone call.  CM Ellan Lambert witnessed phone call    Maryclare Labrador, RN 03/26/2018, 4:14 PM

## 2018-03-26 NOTE — Plan of Care (Signed)
  Problem: Clinical Measurements: Goal: Will remain free from infection Outcome: Progressing   Problem: Activity: Goal: Risk for activity intolerance will decrease Outcome: Progressing   Problem: Nutrition: Goal: Adequate nutrition will be maintained Outcome: Progressing   Problem: Elimination: Goal: Will not experience complications related to bowel motility Outcome: Progressing Goal: Will not experience complications related to urinary retention Outcome: Progressing   Problem: Pain Managment: Goal: General experience of comfort will improve Outcome: Progressing   Problem: Skin Integrity: Goal: Risk for impaired skin integrity will decrease Outcome: Progressing

## 2018-03-26 NOTE — Discharge Instructions (Signed)
Follow with Primary MD McKeown, William, MD in 7 days   Get CBC, CMP, checked  by Primary MD next visit.    Activity: As tolerated with Full fall precautions use walker/cane & assistance as needed   Disposition Home    Diet: Heart Healthy , with feeding assistance and aspiration precautions.    On your next visit with your primary care physician please Get Medicines reviewed and adjusted.   Please request your Prim.MD to go over all Hospital Tests and Procedure/Radiological results at the follow up, please get all Hospital records sent to your Prim MD by signing hospital release before you go home.   If you experience worsening of your admission symptoms, develop shortness of breath, life threatening emergency, suicidal or homicidal thoughts you must seek medical attention immediately by calling 911 or calling your MD immediately  if symptoms less severe.  You Must read complete instructions/literature along with all the possible adverse reactions/side effects for all the Medicines you take and that have been prescribed to you. Take any new Medicines after you have completely understood and accpet all the possible adverse reactions/side effects.   Do not drive, operating heavy machinery, perform activities at heights, swimming or participation in water activities or provide baby sitting services if your were admitted for syncope or siezures until you have seen by Primary MD or a Neurologist and advised to do so again.  Do not drive when taking Pain medications.    Do not take more than prescribed Pain, Sleep and Anxiety Medications  Special Instructions: If you have smoked or chewed Tobacco  in the last 2 yrs please stop smoking, stop any regular Alcohol  and or any Recreational drug use.  Wear Seat belts while driving.   Please note  You were cared for by a hospitalist during your hospital stay. If you have any questions about your discharge medications or the care you  received while you were in the hospital after you are discharged, you can call the unit and asked to speak with the hospitalist on call if the hospitalist that took care of you is not available. Once you are discharged, your primary care physician will handle any further medical issues. Please note that NO REFILLS for any discharge medications will be authorized once you are discharged, as it is imperative that you return to your primary care physician (or establish a relationship with a primary care physician if you do not have one) for your aftercare needs so that they can reassess your need for medications and monitor your lab values.  

## 2018-03-26 NOTE — Progress Notes (Signed)
  Echocardiogram 2D Echocardiogram has been performed.  Deborah [name] 03/26/2018, 1:50 PM

## 2018-03-26 NOTE — Care Management Note (Addendum)
Case Management Note Previous Note Created by Jonnie Finner  Patient Details  Name: Deborah [name] MRN: [id number] Date of Birth: 02-14-1946  Subjective/Objective:     Left TKA               Action/Plan: NCM spoke to pt and husband at bedside. Pt was confused at bedside. Husband answered questions. Offered choice for HH/CMS list provided/placed on chart. Pt has RW at home. Requested 3n1 bedside commode. Contacted [facility] and delivered to room. Contacted [facility] with new referral.   Expected Discharge Date:  03/26/18               Expected Discharge Plan:     In-House Referral:     Discharge planning Services     Post Acute Care Choice:    Choice offered to:     DME Arranged:  3-N-1 DME Agency:  [place]:  [place]:  [facility]  Status of Service:  Completed, signed off  If discussed at [employer] of Stay Meetings, dates discussed:    Additional Comments: 03/26/2018  CM informed that pt had 3:1 in the home and does not need a new one  Pt discharging home today in care of husband.  CM contacted both [facility] and [facility] and informed that pt will discharge today - both continue to accept referral. Deborah Labrador, RN 03/26/2018, 4:24 PM

## 2018-03-26 NOTE — Progress Notes (Signed)
Discharged home by wheelchair accompanied by husband, discharge instructions and prescription given to pt. Bedside commode to be delivered at home tom. by [facility] as per care mgt.  Husband aware. Belongings taken home.

## 2018-03-26 NOTE — Discharge Summary (Signed)
Deborah [name], is a 72 y.o. female  DOB 1946-03-06  MRN [id number].  Admission date:  03/25/2018  Admitting Physician  Merton Border, MD  Discharge Date:  03/26/2018   Primary MD  Unk Pinto, MD  Recommendations for primary care physician for things to follow:  -Please check CBC, BMP during next visit   Admission Diagnosis  Syncope and collapse [R55]   Discharge Diagnosis  Syncope and collapse [R55]    Active Problems:   Syncope      Past Medical History:  Diagnosis Date  . Allergy   . Anemia   . Anxiety   . Aortic atherosclerosis ([place]) 11/2016   Noted on CT abd  . Arthritis    hands and knees  . Bilateral cataracts    History of  . Cellulitis    Right finger  . Cerebral amyloid angiopathy ([facility])   . Cervical spondylosis 12/2008   Noted on Cervical spine  . Chronic nausea   . Compression fracture of T12 vertebra ([facility]) 06/07/2017   Mild  . DDD (degenerative disc disease), cervical 12/2008   Noted on Cervical spine  . Depression   . Encephalopathy 01/02/2017   Mild, [place] EEG  . GERD (gastroesophageal reflux disease)   . Helicobacter pylori infection 2018  . History of appendicitis    age 32  . History of colon polyps   . History of confusion   . History of hiatal hernia 10/16/2016   Noted on Endoscopy  . Hyperlipidemia   . Internal hemorrhoid 08/02/2017   Noted on Colonoscopy  . Low blood pressure reading   . Migraines   . Osteopenia   . PONV (postoperative nausea and vomiting)   . Pre-diabetes   . RLS (restless legs syndrome)   . Seizure ([place])    atypical partial comples vs. confusional migraines  . Skin cancer    right lower eyelid  . Squamous cell skin cancer    history of neck and upper back  . Syncope 03/25/2018  . TIA (transient ischemic attack)    questionable per patient  . Vitamin D deficiency     Past Surgical History:  Procedure Laterality  Date  . APPENDECTOMY  1959  . CATARACT EXTRACTION, BILATERAL Bilateral 2014  . [place]  . COLONOSCOPY W/ POLYPECTOMY  08/02/2017  . KNEE ARTHROSCOPY Bilateral 04/2014   [facility]  . MOHS SURGERY Right 04/2013   Squamous cell cacinoma, neck/upper back  . SPINE SURGERY  2010   C6-C7 fusion  . TOTAL KNEE ARTHROPLASTY Left 03/22/2018   Procedure: LEFT TOTAL KNEE ARTHROPLASTY;  Surgeon: Dorna Leitz, MD;  Location: [facility];  Service: Orthopedics;  Laterality: Left;  Adductor Block  . UPPER GI ENDOSCOPY  10/16/2016       History of present illness and  Hospital Course:     Kindly see H&P for history of present illness and admission details, please review complete Labs, Consult reports and Test reports for all details in brief  HPI  from the  history and physical done on the day of admission 03/25/2018  Deborah [name]  is a 72 y.o. female, with past medical history significant for complicated migraine headaches and a recent admission on Friday discharged yesterday for left total knee replacement presenting here with a syncopal episode while walking to the bathroom using a walker.  This was witnessed by her husband lasted basically for few seconds. "  She just became plump as a noodle" ; no preceding chest pains or shortness of breath.  No associated nausea  ,cold sweats , or diarrhea. The patient has been followed by neurology with MRI done 1 year ago showing cerebral amyloid angiopathy. In the emergency room her CT of head and CT angiogram of the chest were both negative however the patient was noted to have frequent PVCs and her potassium was 3.1.  Hospital Course   Syncope -Unclear etiology, consider work-up during hospital stay mainly significant for hypokalemia, multiple PVCs and bigeminy on the heart monitor, 2D echo was obtained , had a preserved EF, no regional wall motion abnormalities, hypokalemia was repleted, started on low-dose metoprolol 12.5 mg twice daily. -MRA  with no significant finding, only small there is right paraophthalmic ICA aneurysm, discussed with neurology, it is significant, no further work-up indicated, unlikely causing syncope. -Troponins were negative -May be vasovagal, as it did happen as she returned from the bathroom -CTA chest negative for PE  Hypokalemia -Repleted  History of complicated migraine headaches Followed by neurology.  Will hold Imitrex now and monitor  History of hyperlipidemia on Zetia and Crestor  Anxiety continue with Xanax and Lexapro  Amyloid angiopathy of the brain Followed by neurology as outpatient  Status post left total knee replacement on 03/22/2018 Continue with aspirin.   Discharge Condition:  stable  Follow UP  Follow-up Information    Unk Pinto, MD Follow up in 1 week(s).   Specialty:  Internal Medicine Contact information: [address] [place] [address] [phone]             Discharge Instructions  and  Discharge Medications     Discharge Instructions    Discharge instructions   Complete by:  As directed    Follow with Primary MD Unk Pinto, MD in 7 days   Get CBC, CMP, checked  by Primary MD next visit.    Activity: As tolerated with Full fall precautions use walker/cane & assistance as needed   Disposition Home    Diet: Heart Healthy , with feeding assistance and aspiration precautions.   On your next visit with your primary care physician please Get Medicines reviewed and adjusted.   Please request your Prim.MD to go over all Hospital Tests and Procedure/Radiological results at the follow up, please get all Hospital records sent to your Prim MD by signing hospital release before you go home.   If you experience worsening of your admission symptoms, develop shortness of breath, life threatening emergency, suicidal or homicidal thoughts you must seek medical attention immediately by calling 911 or calling your MD  immediately  if symptoms less severe.  You Must read complete instructions/literature along with all the possible adverse reactions/side effects for all the Medicines you take and that have been prescribed to you. Take any new Medicines after you have completely understood and accpet all the possible adverse reactions/side effects.   Do not drive, operating heavy machinery, perform activities at heights, swimming or participation in water activities or provide baby sitting services if your were admitted for  syncope or siezures until you have seen by Primary MD or a Neurologist and advised to do so again.  Do not drive when taking Pain medications.    Do not take more than prescribed Pain, Sleep and Anxiety Medications  Special Instructions: If you have smoked or chewed Tobacco  in the last 2 yrs please stop smoking, stop any regular Alcohol  and or any Recreational drug use.  Wear Seat belts while driving.   Please note  You were cared for by a hospitalist during your hospital stay. If you have any questions about your discharge medications or the care you received while you were in the hospital after you are discharged, you can call the unit and asked to speak with the hospitalist on call if the hospitalist that took care of you is not available. Once you are discharged, your primary care physician will handle any further medical issues. Please note that NO REFILLS for any discharge medications will be authorized once you are discharged, as it is imperative that you return to your primary care physician (or establish a relationship with a primary care physician if you do not have one) for your aftercare needs so that they can reassess your need for medications and monitor your lab values.   Increase activity slowly   Complete by:  As directed      Allergies as of 03/26/2018      Reactions   Banana Nausea And Vomiting   Chocolate Other (See Comments)   Unknown   Effexor [venlafaxine]  Other (See Comments)   insomnia   Maxalt [rizatriptan Benzoate] Nausea Only, Other (See Comments)   Nausea and excessive salivation   Other Other (See Comments)   NUTS (all kinds)   Tussin [guaifenesin] Rash   Zostavax [zoster Vaccine Live] Itching, Rash      Medication List    TAKE these medications   ALPRAZolam 0.5 MG tablet Commonly known as:  XANAX Take 1/2 to 1 tablet 2 to 3 x /day ONLY as needed to rescue Panic / Anxiety attacks What changed:    how much to take  how to take this  when to take this  reasons to take this  additional instructions   aspirin EC 325 MG tablet Take 1 tablet (325 mg total) by mouth 2 (two) times daily after a meal. Take x 1 month post op to decrease risk of blood clots. What changed:    when to take this  reasons to take this  additional instructions   Biotin 10 MG Caps Take 10 mg by mouth daily.   cyclobenzaprine 10 MG tablet Commonly known as:  FLEXERIL Take 0.5 tablets (5 mg total) by mouth 3 (three) times daily as needed for muscle spasms.   divalproex 250 MG DR tablet Commonly known as:  DEPAKOTE Take 1 tablet daily or as directed for headaches and seizures What changed:    how much to take  how to take this  when to take this  additional instructions   docusate sodium 100 MG capsule Commonly known as:  COLACE Take 1 capsule (100 mg total) by mouth 2 (two) times daily.   escitalopram 20 MG tablet Commonly known as:  LEXAPRO TAKE 1/2 TO 1 (ONE-HALF TO ONE) TABLET BY MOUTH ONCE DAILY PREVENTION  OF  CHRONIC  ANXIETY  AND  PANIC  ATTACKS What changed:    how much to take  how to take this  when to take this  additional instructions  ezetimibe 10 MG tablet Commonly known as:  ZETIA TAKE 1 TABLET BY MOUTH ONCE DAILY FOR CHOLESTEROL What changed:    how much to take  how to take this  when to take this  additional instructions   famotidine 40 MG tablet Commonly known as:  PEPCID Take 40 mg by  mouth daily.   fluticasone 50 MCG/ACT nasal spray Commonly known as:  FLONASE Place 2 sprays into both nostrils daily as needed for allergies.   HYDROcodone-acetaminophen 5-325 MG tablet Commonly known as:  NORCO/VICODIN Take 1-2 tablets by mouth every 6 (six) hours as needed for severe pain.   metoCLOPramide 10 MG tablet Commonly known as:  REGLAN Take 1 tablet (10 mg total) by mouth every 6 (six) hours as needed for nausea (or headache).   metoprolol tartrate 25 MG tablet Commonly known as:  LOPRESSOR Take 0.5 tablets (12.5 mg total) by mouth 2 (two) times daily.   MULTIVITAMIN PO Take 1 tablet by mouth daily.   rosuvastatin 20 MG tablet Commonly known as:  CRESTOR TAKE 1 TABLET BY MOUTH ONCE DAILY   SUMAtriptan 20 MG/ACT nasal spray Commonly known as:  IMITREX USE ONE DOSE IN THE NOSE NOW, MAY REPEAT IN 2 HOURS. MAX OF 2 DOSES IN 24 HOURS. What changed:  See the new instructions.   tiZANidine 2 MG tablet Commonly known as:  ZANAFLEX Take 1 tablet (2 mg total) by mouth every 8 (eight) hours as needed for muscle spasms.   topiramate 100 MG tablet Commonly known as:  TOPAMAX TAKE ONE TABLET BY MOUTH TWICE DAILY FOR  HEADACHE  PREVENTION What changed:    how much to take  how to take this  when to take this  additional instructions   Vitamin D3 125 MCG (5000 UT) Caps Take 5,000 Units by mouth daily.         Diet and Activity recommendation: See Discharge Instructions above   Consults obtained -  none   Major procedures and Radiology Reports - PLEASE review detailed and final reports for all details, in brief -     Dg Chest 2 View  Result Date: 03/12/2018 CLINICAL DATA:  Preop no cardiac history previous smoker EXAM: CHEST - 2 VIEW COMPARISON:  12/16/2016 FINDINGS: Postsurgical changes at the cervical spine. No acute opacity or pleural effusion. Stable cardiomediastinal silhouette. No pneumothorax. Scoliosis of the spine. Interval mild compression of  thoracolumbar vertebra. IMPRESSION: No active cardiopulmonary disease. Electronically Signed   By: Donavan Foil M.D.   On: 03/12/2018 14:42   Ct Head Wo Contrast  Result Date: 03/25/2018 CLINICAL DATA:  Syncope.  Struck back of head. EXAM: CT HEAD WITHOUT CONTRAST TECHNIQUE: Contiguous axial images were obtained from the base of the skull through the vertex without intravenous contrast. COMPARISON:  12/15/2016 FINDINGS: Brain: Mild global atrophy is appropriate to age. Chronic ischemic changes in the periventricular white matter are stable. There is no mass effect, midline shift, or acute intracranial hemorrhage. Vascular: No hyperdense vessel or unexpected calcification. Skull: Cranium is intact. Sinuses/Orbits: Visualized paranasal sinuses and mastoid air cells are clear. Visualized orbits are within normal limits. Other: Noncontributory. IMPRESSION: No acute intracranial pathology. Electronically Signed   By: Marybelle Killings M.D.   On: 03/25/2018 10:24   Ct Angio Chest Pe W/cm &/or Wo Cm  Result Date: 03/25/2018 CLINICAL DATA:  Shortness of breath and positive D-dimer study EXAM: CT ANGIOGRAPHY CHEST WITH CONTRAST TECHNIQUE: Multidetector CT imaging of the chest was performed using the standard protocol during  bolus administration of intravenous contrast. Multiplanar CT image reconstructions and MIPs were obtained to evaluate the vascular anatomy. CONTRAST:  192mL ISOVUE-370 IOPAMIDOL (ISOVUE-370) INJECTION 76% COMPARISON:  Chest radiograph March 25, 2018 FINDINGS: Cardiovascular: There is no demonstrable pulmonary embolus. There is no thoracic aortic aneurysm or dissection. Visualized great vessels appear normal except for occasional foci of atherosclerotic calcification, not causing hemodynamically significant obstruction. There are foci of aortic atherosclerosis. There are foci of coronary artery calcification. There is no pericardial effusion or pericardial thickening. There is mild left ventricular  hypertrophy. Mediastinum/Nodes: There is a 6 mm nodular opacity in the right lobe of the thyroid. Thyroid otherwise appears unremarkable. No thoracic adenopathy is appreciable. No esophageal lesions are evident. Lungs/Pleura: There is mild bibasilar pleural thickening. There are areas of patchy atelectasis bilaterally. There is no appreciable edema or consolidation. Upper Abdomen: There is upper abdominal aortic atherosclerosis. Visualized upper abdominal structures otherwise appear unremarkable. Musculoskeletal: There is anterior wedging of the T12 vertebral body. No blastic or lytic bone lesions are evident. There are no chest wall lesions evident. Review of the MIP images confirms the above findings. IMPRESSION: 1. No demonstrable pulmonary embolus. No thoracic aortic aneurysm or dissection. There are foci of aortic atherosclerosis as well as foci of coronary artery and great vessel calcification. 2.  Left ventricular hypertrophy. 3. Areas of atelectatic change bilaterally. No edema or consolidation. 4.  No appreciable adenopathy. 5. Subcentimeter nodular lesion right lobe of thyroid. Per consensus guidelines, a nodule of this small size does not warrant additional imaging surveillance. Aortic Atherosclerosis (ICD10-I70.0). Electronically Signed   By: Lowella Grip III M.D.   On: 03/25/2018 12:03   Mr Jodene Nam Head Wo Contrast  Result Date: 03/25/2018 CLINICAL DATA:  Syncopal episode after knee replacement last week. History of migraines. EXAM: MRA HEAD WITHOUT CONTRAST TECHNIQUE: Angiographic images of the Circle of Willis were obtained using MRA technique without intravenous contrast. COMPARISON:  CT HEAD March 25, 2018 and MRI head December 16, 2016 FINDINGS: ANTERIOR CIRCULATION: Normal flow related enhancement of the included cervical, petrous, cavernous and supraclinoid internal carotid arteries. 2 mm superiorly directed RIGHT paraophthalmic artery aneurysm. Patent anterior communicating artery. Patent  anterior and middle cerebral arteries. No large vessel occlusion, flow limiting stenosis. POSTERIOR CIRCULATION: LEFT vertebral artery is dominant. Vertebrobasilar arteries are patent, with normal flow related enhancement of the main branch vessels. Patent posterior cerebral arteries. Robust RIGHT posterior communicating artery present. No large vessel occlusion, flow limiting stenosis,  aneurysm. ANATOMIC VARIANTS: Supernumerary anterior cerebral artery RIGHT A1 2 junction origin. Source images and MIP images were reviewed. IMPRESSION: 1. No emergent large vessel occlusion or flow-limiting stenosis. 2. 2 mm RIGHT paraophthalmic ICA aneurysm. Electronically Signed   By: Elon Alas M.D.   On: 03/25/2018 18:34   Dg Chest Portable 1 View  Result Date: 03/25/2018 CLINICAL DATA:  Syncope with fall EXAM: PORTABLE CHEST 1 VIEW COMPARISON:  March 12, 2018 FINDINGS: There is no evident edema or consolidation. The heart size and pulmonary vascularity are normal. No adenopathy. No pneumothorax. There is postoperative change in the lower cervical region. IMPRESSION: No edema or consolidation.  Stable cardiac silhouette. Electronically Signed   By: Lowella Grip III M.D.   On: 03/25/2018 08:57    Micro Results     Recent Results (from the past 240 hour(s))  Urine culture     Status: Abnormal   Collection Time: 03/25/18  9:21 AM  Result Value Ref Range Status   Specimen Description URINE, CLEAN CATCH  Final   Special Requests   Final    NONE Performed at [facility], [place] [address], [place], [place] [address]    Culture MULTIPLE SPECIES PRESENT, SUGGEST RECOLLECTION (A)  Final   Report Status 03/26/2018 FINAL  Final       Today   Subjective:   Chyrl Elwell today has no headache,no chest or abdominal pain,no new weakness tingling or numbness, feels much better wants to go home today.   Objective:   Blood pressure 130/84, pulse 99, temperature 98.1 F (36.7 C), temperature  source Oral, resp. rate 15, height 5\' 9"  (1.753 m), weight 70.6 kg, SpO2 100 %.   Intake/Output Summary (Last 24 hours) at 03/26/2018 1516 Last data filed at 03/26/2018 1200 Gross per 24 hour  Intake 460 ml  Output 150 ml  Net 310 ml    Exam Awake Alert, Oriented x 3, No new F.N deficits, Normal affect Symmetrical Chest wall movement, Good air movement bilaterally, CTAB RRR,No Gallops,Rubs or new Murmurs, No Parasternal Heave +ve B.Sounds, Abd Soft, Non tender, No organomegaly appriciated, No rebound -guarding or rigidity. No Cyanosis, Clubbing or edema, No new Rash or bruise  Data Review   CBC w Diff:  Lab Results  Component Value Date   WBC 8.5 03/25/2018   HGB 11.0 (L) 03/25/2018   HCT 34.9 (L) 03/25/2018   HCT 40.8 12/15/2016   PLT 224 03/25/2018   LYMPHOPCT 13 03/25/2018   MONOPCT 10 03/25/2018   EOSPCT 1 03/25/2018   BASOPCT 1 03/25/2018    CMP:  Lab Results  Component Value Date   NA 135 03/26/2018   K 3.5 03/26/2018   CL 106 03/26/2018   CO2 19 (L) 03/26/2018   BUN 5 (L) 03/26/2018   CREATININE 0.68 03/26/2018   CREATININE 0.76 02/26/2018   PROT 6.1 (L) 03/25/2018   ALBUMIN 3.3 (L) 03/25/2018   BILITOT 0.8 03/25/2018   ALKPHOS 69 03/25/2018   AST 64 (H) 03/25/2018   ALT 40 03/25/2018  .   Total Time in preparing paper work, data evaluation and todays exam - 68 minutes  Phillips Climes M.D on 03/26/2018 at 3:16 PM  [facility]   Office  [phone]

## 2018-03-27 ENCOUNTER — Telehealth: Payer: Self-pay | Admitting: *Deleted

## 2018-03-28 ENCOUNTER — Telehealth: Payer: Self-pay | Admitting: *Deleted

## 2018-03-28 NOTE — Telephone Encounter (Signed)
Called patient on 03/28/2018 , 4:13 PM in an attempt to reach the patient for a hospital follow up.   Admit date: 03/25/18 Discharge: 03/26/18   She does not  have any questions or concerns about medications from the hospital admission. The patient's medications were reviewed over the phone, they were counseled to bring in all current medications to the hospital follow up visit.   I advised the patient to call if any questions or concerns arise about the hospital admission or medications    Home health was not  started in the hospital.  All questions were answered and a follow up appointment was made.   Prior to Admission medications   Medication Sig Start Date End Date Taking? Authorizing Provider  ALPRAZolam Duanne Moron) 0.5 MG tablet Take 1/2 to 1 tablet 2 to 3 x /day ONLY as needed to rescue Panic / Anxiety attacks Patient taking differently: Take 0.25-0.5 mg by mouth 3 (three) times daily as needed for anxiety.  02/02/18   Unk Pinto, MD  aspirin EC 325 MG tablet Take 1 tablet (325 mg total) by mouth 2 (two) times daily after a meal. Take x 1 month post op to decrease risk of blood clots. Patient taking differently: Take 325 mg by mouth as needed for mild pain.  03/22/18   Gary Fleet, PA-C  Biotin 10 MG CAPS Take 10 mg by mouth daily.     [provider]  Cholecalciferol (VITAMIN D3) 125 MCG (5000 UT) CAPS Take 5,000 Units by mouth daily.    [provider]  cyclobenzaprine (FLEXERIL) 10 MG tablet Take 0.5 tablets (5 mg total) by mouth 3 (three) times daily as needed for muscle spasms. 05/29/17   Liane Comber, NP  divalproex (DEPAKOTE) 250 MG DR tablet Take 1 tablet daily or as directed for headaches and seizures Patient taking differently: Take 250 mg by mouth daily.  05/29/17 03/25/18  Liane Comber, NP  docusate sodium (COLACE) 100 MG capsule Take 1 capsule (100 mg total) by mouth 2 (two) times daily. 03/22/18   Gary Fleet, PA-C  escitalopram (LEXAPRO) 20 MG  tablet TAKE 1/2 TO 1 (ONE-HALF TO ONE) TABLET BY MOUTH ONCE DAILY PREVENTION  OF  CHRONIC  ANXIETY  AND  PANIC  ATTACKS Patient taking differently: Take 10 mg by mouth daily.  11/06/17   Liane Comber, NP  ezetimibe (ZETIA) 10 MG tablet TAKE 1 TABLET BY MOUTH ONCE DAILY FOR CHOLESTEROL Patient taking differently: Take 10 mg by mouth daily.  09/04/17   Unk Pinto, MD  famotidine (PEPCID) 40 MG tablet Take 40 mg by mouth daily.  11/20/16   [provider]  fluticasone (FLONASE) 50 MCG/ACT nasal spray Place 2 sprays into both nostrils daily as needed for allergies. 06/11/17   Unk Pinto, MD  HYDROcodone-acetaminophen (NORCO) 5-325 MG tablet Take 1-2 tablets by mouth every 6 (six) hours as needed for severe pain. 03/24/18   Gary Fleet, PA-C  metoCLOPramide (REGLAN) 10 MG tablet Take 1 tablet (10 mg total) by mouth every 6 (six) hours as needed for nausea (or headache). 42/59/56   Delora Fuel, MD  metoprolol tartrate (LOPRESSOR) 25 MG tablet Take 0.5 tablets (12.5 mg total) by mouth 2 (two) times daily. 03/26/18   Elgergawy, Silver Huguenin, MD  Multiple Vitamins-Minerals (MULTIVITAMIN PO) Take 1 tablet by mouth daily.    [provider]  rosuvastatin (CRESTOR) 20 MG tablet TAKE 1 TABLET BY MOUTH ONCE DAILY Patient taking differently: Take 20 mg by mouth daily.  11/13/17  Unk Pinto, MD  SUMAtriptan (IMITREX) 20 MG/ACT nasal spray USE ONE DOSE IN THE NOSE NOW, MAY REPEAT IN 2 HOURS. MAX OF 2 DOSES IN 24 HOURS. Patient taking differently: Place 20 mg into the nose every 2 (two) hours as needed for migraine.  02/05/17   Vicie Mutters, PA-C  tiZANidine (ZANAFLEX) 2 MG tablet Take 1 tablet (2 mg total) by mouth every 8 (eight) hours as needed for muscle spasms. 03/22/18   Gary Fleet, PA-C  topiramate (TOPAMAX) 100 MG tablet TAKE ONE TABLET BY MOUTH TWICE DAILY FOR  HEADACHE  PREVENTION Patient taking differently: Take 100 mg by mouth 2 (two) times daily.  02/01/18   Unk Pinto, MD

## 2018-03-29 NOTE — Telephone Encounter (Signed)
Patient has follow up office visit. Knows to call with any questions or concerns.   

## 2018-03-31 ENCOUNTER — Other Ambulatory Visit: Payer: Self-pay | Admitting: Internal Medicine

## 2018-03-31 ENCOUNTER — Encounter: Payer: Self-pay | Admitting: Internal Medicine

## 2018-03-31 DIAGNOSIS — E782 Mixed hyperlipidemia: Secondary | ICD-10-CM

## 2018-03-31 NOTE — Progress Notes (Signed)
[place]     This very nice 72 y.o. MWF  was admitted to the hospital on 03/25/2018 and patient was discharged from the hospital on 03/26/2018. The patient now presents for follow up for transition from recent hospitalization or SNIF stay.  The day after discharge  our clinical staff contacted the patient to assure stability and schedule a follow up appointment. The discharge summary, medications and diagnostic test results were reviewed before meeting with the patient. The patient was admitted for:   Syncope and collapse [R55] PVC's Hypokalemia Confusion     Patient was recently hospitalized 12/6-11/2017 for a Left TKR by Dr Berenice Primas. During hospitalization, she was noted to be having some episodes of delusions & confusion. The morning after returning home she had a syncopal episode. She was evaluated in the ER and had a negative Head CT and Chest CT angiogram. On monitoring she had PVC's and was started on low dose Metoprolol. Troponins were negative and 2D echo was essentially Normal. Brain MRA was also negative. Initial Potassium was low in the ER & was repleted. Patient did have a previous hospitalization in Aug/Sept 2018 with Atypical Seizure vs Acute confusional Migraine with a concomitant Dx of Cerebral Amyloid Angiopathy. Patient has been on Depakote since (but husband has recently tapered her 250 mg tab to 1/2 tab bid for concern of her intermittent confusion).     Hospitalization discharge instructions and medications are reconciled with the patient.      Patient is also followed with labile Hypertension, Hyperlipidemia, Pre-Diabetes and Vitamin D Deficiency. Patient also has hx/o atypical or complicated Migraine vs atypical seizures as above.     Patient is followed  for Labile HTN & BP has been controlled at home. Today's BP is at goal - 106/62. Patient has had no complaints of any cardiac type chest pain, palpitations, dyspnea/orthopnea/PND, dizziness, claudication, or  dependent edema.     Hyperlipidemia is controlled with diet & meds. Patient denies myalgias or other med SE's. Last Lipids were at goal: Lab Results  Component Value Date   CHOL 147 02/26/2018   HDL 60 02/26/2018   LDLCALC 69 02/26/2018   TRIG 95 02/26/2018   CHOLHDL 2.5 02/26/2018      Also, the patient has history of PreDiabetes  (A1c 6.1% / 2011)  and has had no symptoms of reactive hypoglycemia, diabetic polys, paresthesias or visual blurring.  Last A1c was Normal & at goal: Lab Results  Component Value Date   HGBA1C 5.6 02/26/2018      Further, the patient also has history of Vitamin D Deficiency ("18" / 2008) and supplements vitamin D without any suspected side-effects. Last vitamin D was at goal: Lab Results  Component Value Date   VD25OH 77 11/26/2017   Current Outpatient Medications on File Prior to Visit  Medication Sig  . ALPRAZolam (XANAX) 0.5 MG tablet Take 1/2 to 1 tablet 2 to 3 x /day ONLY as needed to rescue Panic / Anxiety attacks (Patient taking differently: Take 0.25-0.5 mg by mouth 3 (three) times daily as needed for anxiety. )  . aspirin EC 325 MG tablet Take 1 tablet (325 mg total) by mouth 2 (two) times daily after a meal. Take x 1 month post op to decrease risk of blood clots. (Patient taking differently: Take 325 mg by mouth as needed for mild pain. )  . Biotin 10 MG CAPS Take 10 mg by mouth daily.   . Cholecalciferol (VITAMIN D3) 125 MCG (5000 UT)  CAPS Take 5,000 Units by mouth daily.  Marland Kitchen docusate sodium (COLACE) 100 MG capsule Take 1 capsule (100 mg total) by mouth 2 (two) times daily.  Marland Kitchen ezetimibe (ZETIA) 10 MG tablet TAKE 1 TABLET BY MOUTH ONCE DAILY FOR  CHOLESTEROL  . famotidine (PEPCID) 40 MG tablet Take 40 mg by mouth daily.   . fluticasone (FLONASE) 50 MCG/ACT nasal spray Place 2 sprays into both nostrils daily as needed for allergies.  Marland Kitchen metoCLOPramide (REGLAN) 10 MG tablet Take 1 tablet (10 mg total) by mouth every 6 (six) hours as needed for nausea  (or headache).  . metoprolol tartrate (LOPRESSOR) 25 MG tablet Take 0.5 tablets (12.5 mg total) by mouth 2 (two) times daily.  . Multiple Vitamins-Minerals (MULTIVITAMIN PO) Take 1 tablet by mouth daily.  . rosuvastatin (CRESTOR) 20 MG tablet TAKE 1 TABLET BY MOUTH ONCE DAILY (Patient taking differently: Take 20 mg by mouth daily. )  . SUMAtriptan (IMITREX) 20 MG/ACT nasal spray USE ONE DOSE IN THE NOSE NOW, MAY REPEAT IN 2 HOURS. MAX OF 2 DOSES IN 24 HOURS. (Patient taking differently: Place 20 mg into the nose every 2 (two) hours as needed for migraine. )   No current facility-administered medications on file prior to visit.    Allergies  Allergen Reactions  . Banana Nausea And Vomiting  . Chocolate Other (See Comments)    Unknown  . Effexor [Venlafaxine] Other (See Comments)    insomnia  . Maxalt [Rizatriptan Benzoate] Nausea Only and Other (See Comments)    Nausea and excessive salivation  . Other Other (See Comments)    NUTS (all kinds)  . Tussin [Guaifenesin] Rash  . Zostavax [Zoster Vaccine Live] Itching and Rash   PMHx:   Past Medical History:  Diagnosis Date  . Allergy   . Anemia   . Anxiety   . Aortic atherosclerosis ([place]) 11/2016   Noted on CT abd  . Arthritis    hands and knees  . Bilateral cataracts    History of  . Cellulitis    Right finger  . Cerebral amyloid angiopathy (CODE)   . Cervical spondylosis 12/2008   Noted on Cervical spine  . Chronic nausea   . Compression fracture of T12 vertebra (HCC) 06/07/2017   Mild  . DDD (degenerative disc disease), cervical 12/2008   Noted on Cervical spine  . Depression   . Encephalopathy 01/02/2017   Mild, [facility]  . GERD (gastroesophageal reflux disease)   . Helicobacter pylori infection 2018  . History of appendicitis    age 78  . History of colon polyps   . History of confusion   . History of hiatal hernia 10/16/2016   Noted on Endoscopy  . Hyperlipidemia   . Internal hemorrhoid 08/02/2017   Noted  on Colonoscopy  . Low blood pressure reading   . Migraines   . Osteopenia   . PONV (postoperative nausea and vomiting)   . Pre-diabetes   . RLS (restless legs syndrome)   . Seizure ([place])    atypical partial comples vs. confusional migraines  . Skin cancer    right lower eyelid  . Squamous cell skin cancer    history of neck and upper back  . Syncope 03/25/2018  . TIA (transient ischemic attack)    questionable per patient  . Vitamin D deficiency    Immunization History  Administered Date(s) Administered  . Influenza, High Dose Seasonal PF 01/01/2014, 03/15/2015, 12/13/2015, 01/03/2017, 02/05/2018  . Influenza-Unspecified 12/26/2012  . Pneumococcal  Conjugate-13 08/06/2014  . Pneumococcal-Unspecified 12/26/2012  . Td 12/26/2012  . Tdap 05/08/2017  . Zoster 12/08/2010   Past Surgical History:  Procedure Laterality Date  . APPENDECTOMY  1959  . CATARACT EXTRACTION, BILATERAL Bilateral 2014  . [place]  . COLONOSCOPY W/ POLYPECTOMY  08/02/2017  . KNEE ARTHROSCOPY Bilateral 04/2014   [facility]  . MOHS SURGERY Right 04/2013   Squamous cell cacinoma, neck/upper back  . SPINE SURGERY  2010   C6-C7 fusion  . TOTAL KNEE ARTHROPLASTY Left 03/22/2018   Procedure: LEFT TOTAL KNEE ARTHROPLASTY;  Surgeon: Dorna Leitz, MD;  Location: [facility];  Service: Orthopedics;  Laterality: Left;  Adductor Block  . UPPER GI ENDOSCOPY  10/16/2016   FHx:    Reviewed / unchanged  SHx:    Reviewed / unchanged  Systems Review:  Constitutional: Denies fever, chills, wt changes, headaches, insomnia, fatigue, night sweats, change in appetite. Eyes: Denies redness, blurred vision, diplopia, discharge, itchy, watery eyes.  ENT: Denies discharge, congestion, post nasal drip, epistaxis, sore throat, earache, hearing loss, dental pain, tinnitus, vertigo, sinus pain, snoring.  CV: Denies chest pain, palpitations, irregular heartbeat, syncope, dyspnea, diaphoresis, orthopnea, PND,  claudication or edema. Respiratory: denies cough, dyspnea, DOE, pleurisy, hoarseness, laryngitis, wheezing.  Gastrointestinal: Denies dysphagia, odynophagia, heartburn, reflux, water brash, abdominal pain or cramps, nausea, vomiting, bloating, diarrhea, constipation, hematemesis, melena, hematochezia  or hemorrhoids. Genitourinary: Denies dysuria, frequency, urgency, nocturia, hesitancy, discharge, hematuria or flank pain. Musculoskeletal: Denies arthralgias, myalgias, stiffness, jt. swelling, pain, limping or strain/sprain.  Skin: Denies pruritus, rash, hives, warts, acne, eczema or change in skin lesion(s). Neuro: No weakness, tremor, incoordination, spasms, paresthesia or pain. Psychiatric: Denies confusion, memory loss or sensory loss. Endo: Denies change in weight, skin or hair change.  Heme/Lymph: No excessive bleeding, bruising or enlarged lymph nodes.  Physical Exam  BP 106/62   Pulse 88   Temp 97.7 F (36.5 C)   Resp 16   Ht 5' 6.5" (1.689 m)   Wt 149 lb 9.6 oz (67.9 kg)   BMI 23.78 kg/m   Appears well nourished, well groomed  and in no distress.  Eyes: PERRLA, EOMs, conjunctiva no swelling or erythema. Sinuses: No frontal/maxillary tenderness ENT/Mouth: EAC's clear, TM's nl w/o erythema, bulging. Nares clear w/o erythema, swelling, exudates. Oropharynx clear without erythema or exudates. Oral hygiene is good. Tongue normal, non obstructing. Hearing intact.  Neck: Supple. Thyroid nl. Car 2+/2+ without bruits, nodes or JVD. Chest: Respirations nl with BS clear & equal w/o rales, rhonchi, wheezing or stridor.  Cor: Heart sounds normal w/ regular rate and rhythm without sig. murmurs, gallops, clicks or rubs. Peripheral pulses normal and equal  without edema.  Abdomen: Soft & bowel sounds normal. Non-tender w/o guarding, rebound, hernias, masses or organomegaly.  Lymphatics: Unremarkable.  Musculoskeletal: Full ROM all peripheral extremities, joint stability, 5/5 strength and  normal gait.  Skin: Warm, dry without exposed rashes, lesions or ecchymosis apparent.  Neuro: Cranial nerves intact, reflexes equal bilaterally. Sensory-motor testing grossly intact. Tendon reflexes grossly intact.  Pysch: Alert & oriented x 3.  Insight and judgement nl & appropriate. No ideations.  Assessment and Plan:  1. Syncope and collapse  - CBC with Differential/Platelet - COMPLETE METABOLIC PANEL WITH GFR  2. Frequent PVCs  - COMPLETE METABOLIC PANEL WITH GFR  3. Hypokalemia  - COMPLETE METABOLIC PANEL WITH GFR  4. Confusion  - Patient's husband handling her meds was given instructions to taper to d/c her Depakote. Also, new Rx  tapering her Lexapro to 5 mg tab daily and also cutting back on her Topamax 100 mg to 1/2 tab = 50 mg bid.   - COMPLETE METABOLIC PANEL WITH GFR  5. Seizure disorder (HCC)  - Valproic acid level  6. Essential hypertension  - Continue medication, monitor blood pressure at home.  - Continue DASH diet. Reminder to go to the ER if any CP,  SOB, nausea, dizziness, severe HA, changes vision/speech.  - CBC with Differential/Platelet - COMPLETE METABOLIC PANEL WITH GFR  7. Migraine without aura and without status migrainosus, not intractable   8. Medication management  - CBC with Differential/Platelet - COMPLETE METABOLIC PANEL WITH GFR - Valproic acid level      Discussed  regular exercise, BP monitoring, weight control to achieve/maintain BMI less than 25 and discussed meds and SE's. Recommended labs to assess and monitor clinical status with further disposition pending results of labs. Over 30 minutes of exam, counseling, chart review was performed. ROV 1 mo to reassess after med changes.

## 2018-04-01 ENCOUNTER — Encounter: Payer: Self-pay | Admitting: Internal Medicine

## 2018-04-01 ENCOUNTER — Ambulatory Visit: Payer: Medicare Other | Admitting: Internal Medicine

## 2018-04-01 VITALS — BP 106/62 | HR 88 | Temp 97.7°F | Resp 16 | Ht 66.5 in | Wt 149.6 lb

## 2018-04-01 DIAGNOSIS — R410 Disorientation, unspecified: Secondary | ICD-10-CM | POA: Diagnosis not present

## 2018-04-01 DIAGNOSIS — G40909 Epilepsy, unspecified, not intractable, without status epilepticus: Secondary | ICD-10-CM

## 2018-04-01 DIAGNOSIS — R569 Unspecified convulsions: Secondary | ICD-10-CM

## 2018-04-01 DIAGNOSIS — I10 Essential (primary) hypertension: Secondary | ICD-10-CM

## 2018-04-01 DIAGNOSIS — G43009 Migraine without aura, not intractable, without status migrainosus: Secondary | ICD-10-CM

## 2018-04-01 DIAGNOSIS — I493 Ventricular premature depolarization: Secondary | ICD-10-CM | POA: Diagnosis not present

## 2018-04-01 DIAGNOSIS — Z79899 Other long term (current) drug therapy: Secondary | ICD-10-CM

## 2018-04-01 DIAGNOSIS — E876 Hypokalemia: Secondary | ICD-10-CM

## 2018-04-01 DIAGNOSIS — R55 Syncope and collapse: Secondary | ICD-10-CM

## 2018-04-01 MED ORDER — ESCITALOPRAM OXALATE 5 MG PO TABS: ORAL_TABLET | ORAL | 1 refills | Status: DC

## 2018-04-01 MED ORDER — DIVALPROEX SODIUM 250 MG PO DR TAB: DELAYED_RELEASE_TABLET | ORAL | 1 refills | Status: DC

## 2018-04-01 MED ORDER — TOPIRAMATE 100 MG PO TABS: ORAL_TABLET | ORAL | 1 refills | Status: DC

## 2018-04-01 NOTE — Patient Instructions (Signed)
Recommended tapering Depakote 250 mg currently at 1/2 tab 2 x /day  To 1/2 tablet daily for 1 week,  Then 1/2 tablet every other day for 2sd week ,  Then stop +++++++++++++++++++++++++++++++++++ Also decrease Topamax 100 mg bid  To 1/2 tablet  (50 mg) 2 x /day at Supper & bedtime +++++++++++++++++++++++++++++++++++ Recommend Adult Low Dose Aspirin or  coated  Aspirin 81 mg daily  To reduce risk of Colon Cancer 20 %,  Skin Cancer 26 % ,  Melanoma 46%  and  Pancreatic cancer 60% +++++++++++++++++++++++++ Vitamin D goal  is between 70-100.  Please make sure that you are taking your Vitamin D as directed.  It is very important as a natural anti-inflammatory  helping hair, skin, and nails, as well as reducing stroke and heart attack risk.  It helps your bones and helps with mood. It also decreases numerous cancer risks so please take it as directed.  Low Vit D is associated with a 200-300% higher risk for CANCER  and 200-300% higher risk for HEART   ATTACK  &  STROKE.   .....................................Marland Kitchen It is also associated with higher death rate at younger ages,  autoimmune diseases like Rheumatoid arthritis, Lupus, Multiple Sclerosis.    Also many other serious conditions, like depression, Alzheimer's Dementia, infertility, muscle aches, fatigue, fibromyalgia - just to name a few. ++++++++++++++++++++ Recommend the book "The END of DIETING" by Dr Excell Seltzer  & the book "The END of DIABETES " by Dr Excell Seltzer At [email or web address] - get book & Audio CD's    Being diabetic has a  300% increased risk for heart attack, stroke, cancer, and alzheimer- type vascular dementia. It is very important that you work harder with diet by avoiding all foods that are white. Avoid white rice (brown & wild rice is OK), white potatoes (sweetpotatoes in moderation is OK), White bread or wheat bread or anything made out of white flour like bagels, donuts, rolls, buns, biscuits, cakes, pastries, cookies,  pizza crust, and pasta (made from white flour & egg whites) - vegetarian pasta or spinach or wheat pasta is OK. Multigrain breads like Arnold's or [employer], or multigrain sandwich thins or flatbreads.  Diet, exercise and weight loss can reverse and cure diabetes in the early stages.  Diet, exercise and weight loss is very important in the control and prevention of complications of diabetes which affects every system in your body, ie. Brain - dementia/stroke, eyes - glaucoma/blindness, heart - heart attack/heart failure, kidneys - dialysis, stomach - gastric paralysis, intestines - malabsorption, nerves - severe painful neuritis, circulation - gangrene & loss of a leg(s), and finally cancer and Alzheimers.    I recommend avoid fried & greasy foods,  sweets/candy, white rice (brown or wild rice or Quinoa is OK), white potatoes (sweet potatoes are OK) - anything made from white flour - bagels, doughnuts, rolls, buns, biscuits,white and wheat breads, pizza crust and traditional pasta made of white flour & egg white(vegetarian pasta or spinach or wheat pasta is OK).  Multi-grain bread is OK - like multi-grain flat bread or sandwich thins. Avoid alcohol in excess. Exercise is also important.    Eat all the vegetables you want - avoid meat, especially red meat and dairy - especially cheese.  Cheese is the most concentrated form of trans-fats which is the worst thing to clog up our arteries. Veggie cheese is OK which can be found in the fresh produce section at [facility] or [employer] or [employer]  +++++++++++++++++++++  DASH Eating Plan  DASH stands for "Dietary Approaches to Stop Hypertension."   The DASH eating plan is a healthy eating plan that has been shown to reduce high blood pressure (hypertension). Additional health benefits may include reducing the risk of type 2 diabetes mellitus, heart disease, and stroke. The DASH eating plan may also help with weight loss. WHAT DO I NEED TO KNOW ABOUT  THE DASH EATING PLAN? For the DASH eating plan, you will follow these general guidelines:  Choose foods with a percent daily value for sodium of less than 5% (as listed on the food label).  Use salt-free seasonings or herbs instead of table salt or sea salt.  Check with your health care provider or pharmacist before using salt substitutes.  Eat lower-sodium products, often labeled as "lower sodium" or "no salt added."  Eat fresh foods.  Eat more vegetables, fruits, and low-fat dairy products.  Choose whole grains. Look for the word "whole" as the first word in the ingredient list.  Choose fish   Limit sweets, desserts, sugars, and sugary drinks.  Choose heart-healthy fats.  Eat veggie cheese   Eat more home-cooked food and less restaurant, buffet, and fast food.  Limit fried foods.  Cook foods using methods other than frying.  Limit canned vegetables. If you do use them, rinse them well to decrease the sodium.  When eating at a restaurant, ask that your food be prepared with less salt, or no salt if possible.                      WHAT FOODS CAN I EAT? Read Dr Fara Olden Fuhrman's books on The End of Dieting & The End of Diabetes  Grains Whole grain or whole wheat bread. Brown rice. Whole grain or whole wheat pasta. Quinoa, bulgur, and whole grain cereals. Low-sodium cereals. Corn or whole wheat flour tortillas. Whole grain cornbread. Whole grain crackers. Low-sodium crackers.  Vegetables Fresh or frozen vegetables (raw, steamed, roasted, or grilled). Low-sodium or reduced-sodium tomato and vegetable juices. Low-sodium or reduced-sodium tomato sauce and paste. Low-sodium or reduced-sodium canned vegetables.   Fruits All fresh, canned (in natural juice), or frozen fruits.  Protein Products  All fish and seafood.  Dried beans, peas, or lentils. Unsalted nuts and seeds. Unsalted canned beans.  Dairy Low-fat dairy products, such as skim or 1% milk, 2% or reduced-fat cheeses,  low-fat ricotta or cottage cheese, or plain low-fat yogurt. Low-sodium or reduced-sodium cheeses.  Fats and Oils Tub margarines without trans fats. Light or reduced-fat mayonnaise and salad dressings (reduced sodium). Avocado. Safflower, olive, or canola oils. Natural peanut or almond butter.  Other Unsalted popcorn and pretzels. The items listed above may not be a complete list of recommended foods or beverages. Contact your dietitian for more options.  +++++++++++++++  WHAT FOODS ARE NOT RECOMMENDED? Grains/ White flour or wheat flour White bread. White pasta. White rice. Refined cornbread. Bagels and croissants. Crackers that contain trans fat.  Vegetables  Creamed or fried vegetables. Vegetables in a . Regular canned vegetables. Regular canned tomato sauce and paste. Regular tomato and vegetable juices.  Fruits Dried fruits. Canned fruit in light or heavy syrup. Fruit juice.  Meat and Other Protein Products Meat in general - RED meat & White meat.  Fatty cuts of meat. Ribs, chicken wings, all processed meats as bacon, sausage, bologna, salami, fatback, hot dogs, bratwurst and packaged luncheon meats.  Dairy Whole or 2% milk, cream, half-and-half, and cream cheese. Whole-fat or  sweetened yogurt. Full-fat cheeses or blue cheese. Non-dairy creamers and whipped toppings. Processed cheese, cheese spreads, or cheese curds.  Condiments Onion and garlic salt, seasoned salt, table salt, and sea salt. Canned and packaged gravies. Worcestershire sauce. Tartar sauce. Barbecue sauce. Teriyaki sauce. Soy sauce, including reduced sodium. Steak sauce. Fish sauce. Oyster sauce. Cocktail sauce. Horseradish. Ketchup and mustard. Meat flavorings and tenderizers. Bouillon cubes. Hot sauce. Tabasco sauce. Marinades. Taco seasonings. Relishes.  Fats and Oils Butter, stick margarine, lard, shortening and bacon fat. Coconut, palm kernel, or palm oils. Regular salad dressings.  Pickles and olives. Salted  popcorn and pretzels.  The items listed above may not be a complete list of foods and beverages to avoid.

## 2018-04-02 LAB — COMPLETE METABOLIC PANEL WITH GFR
AG Ratio: 1.7 (calc) (ref 1.0–2.5)
ALT: 23 U/L (ref 6–29)
AST: 29 U/L (ref 10–35)
Albumin: 3.9 g/dL (ref 3.6–5.1)
Alkaline phosphatase (APISO): 85 U/L (ref 33–130)
BUN: 9 mg/dL (ref 7–25)
CO2: 25 mmol/L (ref 20–32)
Calcium: 10.1 mg/dL (ref 8.6–10.4)
Chloride: 104 mmol/L (ref 98–110)
Creat: 0.76 mg/dL (ref 0.60–0.93)
GFR, Est African American: 91 mL/min/{1.73_m2} (ref >=60)
GFR, Est Non African American: 79 mL/min/{1.73_m2} (ref >=60)
GLUCOSE: 108 mg/dL — AB (ref 65–99)
Globulin: 2.3 g/dL (calc) (ref 1.9–3.7)
Potassium: 5.4 mmol/L — ABNORMAL HIGH (ref 3.5–5.3)
Sodium: 138 mmol/L (ref 135–146)
Total Bilirubin: 0.7 mg/dL (ref 0.2–1.2)
Total Protein: 6.2 g/dL (ref 6.1–8.1)

## 2018-04-02 LAB — CBC WITH DIFFERENTIAL/PLATELET
Absolute Monocytes: 782 cells/uL (ref 200–950)
Basophils Absolute: 88 cells/uL (ref 0–200)
Basophils Relative: 1.3 %
Eosinophils Absolute: 190 cells/uL (ref 15–500)
Eosinophils Relative: 2.8 %
HCT: 33.7 % — ABNORMAL LOW (ref 35.0–45.0)
Hemoglobin: 11 g/dL — ABNORMAL LOW (ref 11.7–15.5)
Lymphs Abs: 1856 cells/uL (ref 850–3900)
MCH: 30.3 pg (ref 27.0–33.0)
MCHC: 32.6 g/dL (ref 32.0–36.0)
MCV: 92.8 fL (ref 80.0–100.0)
MPV: 9.9 fL (ref 7.5–12.5)
Monocytes Relative: 11.5 %
Neutro Abs: 3883 cells/uL (ref 1500–7800)
Neutrophils Relative %: 57.1 %
Platelets: 436 10*3/uL — ABNORMAL HIGH (ref 140–400)
RBC: 3.63 10*6/uL — ABNORMAL LOW (ref 3.80–5.10)
RDW: 12 % (ref 11.0–15.0)
Total Lymphocyte: 27.3 %
WBC: 6.8 10*3/uL (ref 3.8–10.8)

## 2018-04-02 LAB — VALPROIC ACID LEVEL: Valproic Acid Lvl: 62.9 mg/L (ref 50.0–100.0)

## 2018-04-24 ENCOUNTER — Other Ambulatory Visit: Payer: Self-pay | Admitting: *Deleted

## 2018-04-24 MED ORDER — METOPROLOL TARTRATE 25 MG PO TABS: 12.5000 mg | ORAL_TABLET | Freq: Two times a day (BID) | ORAL | 1 refills | Status: DC

## 2018-05-05 ENCOUNTER — Encounter: Payer: Self-pay | Admitting: Internal Medicine

## 2018-05-05 NOTE — Progress Notes (Signed)
Subjective:    Patient ID: Deborah [name], female    DOB: 07-27-1945, 73 y.o.   MRN: [id number]  HPI   This very nice 73 yo MWF had recent hospitalization 12/6-8 for Lt THR and then readmission 12/9-110 after a syncopal episode at home. Medications were adjusted to taper her off of Depakote , tapering her Topomax from 100 to 50 mg bid and also tapering her Lexapro from 10 to 5 mg daily. She returns now for re-evaluation  and reports feeling much more alert , but still having some difficulty with short term recall of names & places and confabulates. Reports no increase of HA's with tapering her meds.   Medication Sig  . ALPRAZolam (XANAX) 0.5 MG tablet Take 1/2 to 1 tablet 2 to 3 x /day ONLY as needed   . aspirin EC 325 MG tablet  Take 325 mg by mouth as needed for mild pain. )  . Biotin 10 MG CAPS Take 10 mg by mouth daily.   Marland Kitchen VITAMIN D 5000 UT Take 5,000 Unitsdaily.  Marland Kitchen COLACE 100 MG  Take 1 capsule (100 mg total) by mouth 2 (two) times daily.  Marland Kitchen escitalopram (LEXAPRO) 5 MG tablet Take 1 tablet daily for prevention of Chronic Anxiety & Panic Attacks  . ezetimibe (ZETIA) 10 MG tablet TAKE 1 TABLET  DAILY FOR  CHOLESTEROL  . famotidine (PEPCID) 40 MG tablet Take 40 mg by mouth daily.   Marland Kitchen fFLONASE nasal spray Place 2 sprays into both nostrils daily as needed for allergies.  . REGLAN 10 MG tablet Take 1 tablet  every 6 (six) hours as needed for nausea (or headache).  . metoprolol tart 25 MG tablet Take 0.5 tablets (12.5 mg total) by mouth 2 (two) times daily.  . Multiple Vitamins-Minerals  Take 1 tablet by mouth daily.  . rosuvastatin (CRESTOR) 20 MG tablet TAKE 1 TABLET ONCE DAILY   . SUMAtriptan 20 MG/ACT nasal spray USE ONE Nasal Spray NOW, MAY REPEAT IN 2 HRS.   Marland Kitchen topiramate (TOPAMAX) 100 MG tablet Take 1/2 tablet 2 x /day at Supper & Bedtime   Allergies  Allergen Reactions  . Banana Nausea And Vomiting  . Chocolate Other (See Comments)    Unknown  . Effexor [Venlafaxine] Other (See  Comments)    insomnia  . Maxalt [Rizatriptan Benzoate] Nausea Only and Other (See Comments)    Nausea and excessive salivation  . Other Other (See Comments)    NUTS (all kinds)  . Tussin [Guaifenesin] Rash  . Zostavax [Zoster Vaccine Live] Itching and Rash   Past Medical History:  Diagnosis Date  . Allergy   . Anemia   . Anxiety   . Aortic atherosclerosis ([place]) 11/2016   Noted on CT abd  . Arthritis    hands and knees  . Bilateral cataracts    History of  . Cellulitis    Right finger  . Cerebral amyloid angiopathy (CODE)   . Cervical spondylosis 12/2008   Noted on Cervical spine  . Chronic nausea   . Compression fracture of T12 vertebra (HCC) 06/07/2017   Mild  . DDD (degenerative disc disease), cervical 12/2008   Noted on Cervical spine  . Depression   . Encephalopathy 01/02/2017   Mild, [facility] EEG  . GERD (gastroesophageal reflux disease)   . Helicobacter pylori infection 2018  . History of appendicitis    age 57  . History of colon polyps   . History of confusion   . History of  hiatal hernia 10/16/2016   Noted on Endoscopy  . Hyperlipidemia   . Internal hemorrhoid 08/02/2017   Noted on Colonoscopy  . Low blood pressure reading   . Migraines   . Osteopenia   . PONV (postoperative nausea and vomiting)   . Pre-diabetes   . RLS (restless legs syndrome)   . Seizure ([place])    atypical partial comples vs. confusional migraines  . Skin cancer    right lower eyelid  . Squamous cell skin cancer    history of neck and upper back  . Syncope 03/25/2018  . TIA (transient ischemic attack)    questionable per patient  . Vitamin D deficiency    Review of Systems .   10 point systems review negative except as above.    Objective:   Physical Exam  BP 126/84   Pulse 62   Temp 97.7 F (36.5 C)   Ht 5' 6.5" (1.689 m)   Wt 146 lb (66.2 kg)   SpO2 99%   BMI 23.21 kg/m   HEENT - WNL. Neck - supple.  Chest - Clear equal BS. Cor - Nl HS. RRR w/o sig MGR. PP  1(+). No edema. MS- FROM w/o deformities.  Gait Nl. Neuro -  Nl w/o focal abnormalities. Mild anomia, otherwise speech fluent    Assessment & Plan:   1. Essential hypertension  - CBC with Differential/Platelet - COMPLETE METABOLIC PANEL WITH GFR  2. Syncope and collapse  - CBC with Differential/Platelet - COMPLETE METABOLIC PANEL WITH GFR  3. Migraine without aura and without status migrainosus, not intractable  4. Anemia, unspecified type  - CBC with Differential/Platelet  5. Medication management  - CBC with Differential/Platelet - COMPLETE METABOLIC PANEL WITH GFR

## 2018-05-06 ENCOUNTER — Ambulatory Visit: Payer: Medicare Other | Admitting: Internal Medicine

## 2018-05-06 ENCOUNTER — Encounter: Payer: Self-pay | Admitting: Internal Medicine

## 2018-05-06 VITALS — BP 126/84 | HR 62 | Temp 97.7°F | Ht 66.5 in | Wt 146.0 lb

## 2018-05-06 DIAGNOSIS — I10 Essential (primary) hypertension: Secondary | ICD-10-CM

## 2018-05-06 DIAGNOSIS — R55 Syncope and collapse: Secondary | ICD-10-CM | POA: Diagnosis not present

## 2018-05-06 DIAGNOSIS — G43009 Migraine without aura, not intractable, without status migrainosus: Secondary | ICD-10-CM

## 2018-05-06 DIAGNOSIS — Z79899 Other long term (current) drug therapy: Secondary | ICD-10-CM

## 2018-05-06 DIAGNOSIS — D649 Anemia, unspecified: Secondary | ICD-10-CM

## 2018-05-06 LAB — COMPLETE METABOLIC PANEL WITHOUT GFR
AG Ratio: 2 (calc) (ref 1.0–2.5)
ALT: 9 U/L (ref 6–29)
AST: 18 U/L (ref 10–35)
Albumin: 4.5 g/dL (ref 3.6–5.1)
Alkaline phosphatase (APISO): 67 U/L (ref 33–130)
BUN: 10 mg/dL (ref 7–25)
CO2: 24 mmol/L (ref 20–32)
Calcium: 10.3 mg/dL (ref 8.6–10.4)
Chloride: 107 mmol/L (ref 98–110)
Creat: 0.85 mg/dL (ref 0.60–0.93)
GFR, Est African American: 79 mL/min/1.73m2
GFR, Est Non African American: 68 mL/min/1.73m2
Globulin: 2.3 g/dL (ref 1.9–3.7)
Glucose, Bld: 102 mg/dL — ABNORMAL HIGH (ref 65–99)
Potassium: 4.5 mmol/L (ref 3.5–5.3)
Sodium: 141 mmol/L (ref 135–146)
Total Bilirubin: 0.5 mg/dL (ref 0.2–1.2)
Total Protein: 6.8 g/dL (ref 6.1–8.1)

## 2018-05-06 LAB — CBC WITH DIFFERENTIAL/PLATELET
Absolute Monocytes: 630 {cells}/uL (ref 200–950)
Basophils Absolute: 80 {cells}/uL (ref 0–200)
Basophils Relative: 1.2 %
Eosinophils Absolute: 161 {cells}/uL (ref 15–500)
Eosinophils Relative: 2.4 %
HCT: 36.7 % (ref 35.0–45.0)
Hemoglobin: 12.5 g/dL (ref 11.7–15.5)
Lymphs Abs: 1997 {cells}/uL (ref 850–3900)
MCH: 30.9 pg (ref 27.0–33.0)
MCHC: 34.1 g/dL (ref 32.0–36.0)
MCV: 90.8 fL (ref 80.0–100.0)
MPV: 10 fL (ref 7.5–12.5)
Monocytes Relative: 9.4 %
Neutro Abs: 3832 {cells}/uL (ref 1500–7800)
Neutrophils Relative %: 57.2 %
Platelets: 359 Thousand/uL (ref 140–400)
RBC: 4.04 Million/uL (ref 3.80–5.10)
RDW: 11.8 % (ref 11.0–15.0)
Total Lymphocyte: 29.8 %
WBC: 6.7 Thousand/uL (ref 3.8–10.8)

## 2018-05-29 ENCOUNTER — Encounter: Payer: Self-pay | Admitting: Internal Medicine

## 2018-05-29 ENCOUNTER — Ambulatory Visit: Payer: Medicare Other | Admitting: Internal Medicine

## 2018-05-29 ENCOUNTER — Other Ambulatory Visit: Payer: Self-pay | Admitting: Internal Medicine

## 2018-05-29 VITALS — BP 110/70 | HR 46 | Temp 97.5°F | Ht 66.5 in | Wt 144.0 lb

## 2018-05-29 DIAGNOSIS — Z87891 Personal history of nicotine dependence: Secondary | ICD-10-CM

## 2018-05-29 DIAGNOSIS — Z8249 Family history of ischemic heart disease and other diseases of the circulatory system: Secondary | ICD-10-CM | POA: Diagnosis not present

## 2018-05-29 DIAGNOSIS — R7309 Other abnormal glucose: Secondary | ICD-10-CM

## 2018-05-29 DIAGNOSIS — Z0000 Encounter for general adult medical examination without abnormal findings: Secondary | ICD-10-CM | POA: Diagnosis not present

## 2018-05-29 DIAGNOSIS — I700 Atherosclerosis of aorta: Secondary | ICD-10-CM

## 2018-05-29 DIAGNOSIS — Z136 Encounter for screening for cardiovascular disorders: Secondary | ICD-10-CM | POA: Diagnosis not present

## 2018-05-29 DIAGNOSIS — K219 Gastro-esophageal reflux disease without esophagitis: Secondary | ICD-10-CM

## 2018-05-29 DIAGNOSIS — Z1211 Encounter for screening for malignant neoplasm of colon: Secondary | ICD-10-CM

## 2018-05-29 DIAGNOSIS — Z1212 Encounter for screening for malignant neoplasm of rectum: Secondary | ICD-10-CM

## 2018-05-29 DIAGNOSIS — I10 Essential (primary) hypertension: Secondary | ICD-10-CM | POA: Diagnosis not present

## 2018-05-29 DIAGNOSIS — Z79899 Other long term (current) drug therapy: Secondary | ICD-10-CM

## 2018-05-29 DIAGNOSIS — Z0001 Encounter for general adult medical examination with abnormal findings: Secondary | ICD-10-CM

## 2018-05-29 DIAGNOSIS — R7303 Prediabetes: Secondary | ICD-10-CM

## 2018-05-29 DIAGNOSIS — E559 Vitamin D deficiency, unspecified: Secondary | ICD-10-CM

## 2018-05-29 DIAGNOSIS — E782 Mixed hyperlipidemia: Secondary | ICD-10-CM

## 2018-05-29 NOTE — Progress Notes (Signed)
[facility] Unk Pinto, M.D.     [name]. [name], P.A.-C Liane Comber, [place] [address] [place], [place] [address] Telephone [phone] Telefax [phone] Annual Screening/Preventative Visit & Comprehensive Evaluation &  Examination     This very nice 73 y.o. MWF presents for a Screening /Preventative Visit & comprehensive evaluation and management of multiple medical co-morbidities.  Patient has been followed for HTN, HLD, Prediabetes  and Vitamin D Deficiency.     In Aug 2018, she was hospitalized with "acute confusion" and diagnosed with Atypical Seizure vs confusional Migraine and was also dx'd with Cerebral Amyloid Angiopathy by the NeuroHospitalist. She was treated with Topamax, then switched to [place] which was discontinued about a month ago and she has been mentally clearer since ( and w/o seizures).        Labile  HTN predates since 2010. Patient's BP has been controlled at home and patient denies any cardiac symptoms as chest pain, palpitations, shortness of breath, dizziness or ankle swelling. Today's BP is at goal - 110/70.      Patient's hyperlipidemia is controlled with diet and medications. Patient denies myalgias or other medication SE's. Last lipids were at goal: Lab Results  Component Value Date   CHOL 147 02/26/2018   HDL 60 02/26/2018   LDLCALC 69 02/26/2018   TRIG 95 02/26/2018   CHOLHDL 2.5 02/26/2018      Patient has hx/o prediabetes (A1c 6.1% / 2011)  and patient denies reactive hypoglycemic symptoms, visual blurring, diabetic polys or paresthesias. Last A1c was Normal & at goal: Lab Results  Component Value Date   HGBA1C 5.6 02/26/2018      Finally, patient has history of Vitamin D Deficiency ("18" / 2008) and last Vitamin D was at goal: Lab Results  Component Value Date   VD25OH 77 11/26/2017   Current Outpatient Medications on File Prior to Visit  Medication  Sig  . ALPRAZolam (XANAX) 0.5 MG tablet Take 1/2 to 1 tablet 2 to 3 x /day ONLY as needed to rescue Panic / Anxiety attacks (Patient taking differently: Take 0.25-0.5 mg by mouth 3 (three) times daily as needed for anxiety. )  . aspirin EC 325 MG tablet Take 1 tablet (325 mg total) by mouth 2 (two) times daily after a meal. Take x 1 month post op to decrease risk of blood clots. (Patient taking differently: Take 325 mg by mouth as needed for mild pain. )  . Biotin 10 MG CAPS Take 10 mg by mouth daily.   . Cholecalciferol (VITAMIN D3) 125 MCG (5000 UT) CAPS Take 5,000 Units by mouth daily.  . divalproex (DEPAKOTE) 250 MG DR tablet Take 1/2 tablet daily for 1 week, then 1/2 tablet every other day for 1 week, then stop  . docusate sodium (COLACE) 100 MG capsule Take 1 capsule (100 mg total) by mouth 2 (two) times daily.  Marland Kitchen escitalopram (LEXAPRO) 5 MG tablet Take 1 tablet daily for prevention of Chronic Anxiety & Panic Attacks  . ezetimibe (ZETIA) 10 MG tablet TAKE 1 TABLET BY MOUTH ONCE DAILY FOR  CHOLESTEROL  . famotidine (PEPCID) 40 MG tablet Take 40 mg by mouth daily.   . fluticasone (FLONASE) 50 MCG/ACT nasal spray Place 2 sprays into both nostrils daily as needed for allergies.  Marland Kitchen metoCLOPramide (REGLAN) 10 MG tablet Take 1 tablet (10 mg total) by mouth every 6 (six) hours as needed for nausea (or headache).  . metoprolol tartrate (LOPRESSOR)  25 MG tablet Take 0.5 tablets (12.5 mg total) by mouth 2 (two) times daily.  . Multiple Vitamins-Minerals (MULTIVITAMIN PO) Take 1 tablet by mouth daily.  . rosuvastatin (CRESTOR) 20 MG tablet TAKE 1 TABLET BY MOUTH ONCE DAILY (Patient taking differently: Take 20 mg by mouth daily. )  . SUMAtriptan (IMITREX) 20 MG/ACT nasal spray USE ONE DOSE IN THE NOSE NOW, MAY REPEAT IN 2 HOURS. MAX OF 2 DOSES IN 24 HOURS. (Patient taking differently: Place 20 mg into the nose every 2 (two) hours as needed for migraine. )  . topiramate (TOPAMAX) 100 MG tablet Take 1/2  tablet 2 x /day at Supper & Bedtime   No current facility-administered medications on file prior to visit.    Allergies  Allergen Reactions  . Banana Nausea And Vomiting  . Chocolate Other (See Comments)    Unknown  . Effexor [Venlafaxine] Other (See Comments)    insomnia  . Maxalt [Rizatriptan Benzoate] Nausea Only and Other (See Comments)    Nausea and excessive salivation  . Other Other (See Comments)    NUTS (all kinds)  . Tussin [Guaifenesin] Rash  . Zostavax [Zoster Vaccine Live] Itching and Rash   Past Medical History:  Diagnosis Date  . Allergy   . Anemia   . Anxiety   . Aortic atherosclerosis ([place]) 11/2016   Noted on CT abd  . Arthritis    hands and knees  . Bilateral cataracts    History of  . Cellulitis    Right finger  . Cerebral amyloid angiopathy ([facility])   . Cervical spondylosis 12/2008   Noted on Cervical spine  . Chronic nausea   . Compression fracture of T12 vertebra ([facility]) 06/07/2017   Mild  . DDD (degenerative disc disease), cervical 12/2008   Noted on Cervical spine  . Depression   . Encephalopathy 01/02/2017   Mild, [place] EEG  . GERD (gastroesophageal reflux disease)   . Helicobacter pylori infection 2018  . History of appendicitis    age 80  . History of colon polyps   . History of confusion   . History of hiatal hernia 10/16/2016   Noted on Endoscopy  . Hyperlipidemia   . Internal hemorrhoid 08/02/2017   Noted on Colonoscopy  . Low blood pressure reading   . Migraines   . Osteopenia   . PONV (postoperative nausea and vomiting)   . Pre-diabetes   . RLS (restless legs syndrome)   . Seizure ([place])    atypical partial comples vs. confusional migraines  . Skin cancer    right lower eyelid  . Squamous cell skin cancer    history of neck and upper back  . Syncope 03/25/2018  . TIA (transient ischemic attack)    questionable per patient  . Vitamin D deficiency    Health Maintenance  Topic Date Due  . COLONOSCOPY  08/03/2019  .  MAMMOGRAM  09/26/2019  . TETANUS/TDAP  05/09/2027  . INFLUENZA VACCINE  Completed  . DEXA SCAN  Completed  . Hepatitis C Screening  Completed  . PNA vac Low Risk Adult  Completed   Immunization History  Administered Date(s) Administered  . Influenza, High Dose Seasonal PF 01/01/2014, 03/15/2015, 12/13/2015, 01/03/2017, 02/05/2018  . Influenza-Unspecified 12/26/2012  . Pneumococcal Conjugate-13 08/06/2014  . Pneumococcal-Unspecified 12/26/2012  . Td 12/26/2012  . Tdap 05/08/2017  . Zoster 12/08/2010   Last Colon -  08/02/2017 - Dr Earlean Shawl - Recc f/u 2 years due Apr 2021  Last MGM - 09/25/2017  Past Surgical History:  Procedure Laterality Date  . APPENDECTOMY  1959  . CATARACT EXTRACTION, BILATERAL Bilateral 2014  . [place]  . COLONOSCOPY W/ POLYPECTOMY  08/02/2017  . KNEE ARTHROSCOPY Bilateral 04/2014   [facility]  . MOHS SURGERY Right 04/2013   Squamous cell cacinoma, neck/upper back  . SPINE SURGERY  2010   C6-C7 fusion  . TOTAL KNEE ARTHROPLASTY Left 03/22/2018   Procedure: LEFT TOTAL KNEE ARTHROPLASTY;  Surgeon: Dorna Leitz, MD;  Location: [facility];  Service: Orthopedics;  Laterality: Left;  Adductor Block  . UPPER GI ENDOSCOPY  10/16/2016   Family History  Problem Relation Age of Onset  . Diabetes Mother   . Heart disease Mother   . Heart disease Father   . Stroke Father   . Hypertension Father   . Hyperlipidemia Sister   . Hypertension Brother    Social History   Tobacco Use  . Smoking status: Former Smoker    Packs/day: 1.00    Years: 4.00    Pack years: 4.00    Types: Cigarettes    Last attempt to quit: 03/25/1986    Years since quitting: 32.2  . Smokeless tobacco: Never Used  Substance Use Topics  . Alcohol use: Not Currently    Comment: occasional  . Drug use: No    ROS Constitutional: Denies fever, chills, weight loss/gain, headaches, insomnia,  night sweats, and change in appetite. Does c/o fatigue. Eyes: Denies redness,  blurred vision, diplopia, discharge, itchy, watery eyes.  ENT: Denies discharge, congestion, post nasal drip, epistaxis, sore throat, earache, hearing loss, dental pain, Tinnitus, Vertigo, Sinus pain, snoring.  Cardio: Denies chest pain, palpitations, irregular heartbeat, syncope, dyspnea, diaphoresis, orthopnea, PND, claudication, edema Respiratory: denies cough, dyspnea, DOE, pleurisy, hoarseness, laryngitis, wheezing.  Gastrointestinal: Denies dysphagia, heartburn, reflux, water brash, pain, cramps, nausea, vomiting, bloating, diarrhea, constipation, hematemesis, melena, hematochezia, jaundice, hemorrhoids Genitourinary: Denies dysuria, frequency, urgency, nocturia, hesitancy, discharge, hematuria, flank pain Breast: Breast lumps, nipple discharge, bleeding.  Musculoskeletal: Denies arthralgia, myalgia, stiffness, Jt. Swelling, pain, limp, and strain/sprain. Denies falls. Skin: Denies puritis, rash, hives, warts, acne, eczema, changing in skin lesion Neuro: No weakness, tremor, incoordination, spasms, paresthesia, pain Psychiatric: Denies confusion, memory loss, sensory loss. Denies Depression. Endocrine: Denies change in weight, skin, hair change, nocturia, and paresthesia, diabetic polys, visual blurring, hyper / hypo glycemic episodes.  Heme/Lymph: No excessive bleeding, bruising, enlarged lymph nodes.  Physical Exam  BP 110/70   Pulse (!) 46   Temp (!) 97.5 F (36.4 C)   Ht 5' 6.5" (1.689 m)   Wt 144 lb (65.3 kg)   SpO2 97%   BMI 22.89 kg/m   General Appearance: Well nourished, well groomed and in no apparent distress.  Eyes: PERRLA, EOMs, conjunctiva no swelling or erythema, normal fundi and vessels. Sinuses: No frontal/maxillary tenderness ENT/Mouth: EACs patent / TMs  nl. Nares clear without erythema, swelling, mucoid exudates. Oral hygiene is good. No erythema, swelling, or exudate. Tongue normal, non-obstructing. Tonsils not swollen or erythematous. Hearing normal.  Neck:  Supple, thyroid not palpable. No bruits, nodes or JVD. Respiratory: Respiratory effort normal.  BS equal and clear bilateral without rales, rhonci, wheezing or stridor. Cardio: Heart sounds are normal with regular rate and rhythm and no murmurs, rubs or gallops. Peripheral pulses are normal and equal bilaterally without edema. No aortic or femoral bruits. Chest: symmetric with normal excursions and percussion. Breasts: Symmetric, without lumps, nipple discharge, retractions, or fibrocystic changes.  Abdomen: Flat, soft with bowel  sounds active. Nontender, no guarding, rebound, hernias, masses, or organomegaly.  Lymphatics: Non tender without lymphadenopathy.  Genitourinary:  Musculoskeletal: Full ROM all peripheral extremities, joint stability, 5/5 strength, and normal gait. Skin: Warm and dry without rashes, lesions, cyanosis, clubbing or  ecchymosis.  Neuro: Cranial nerves intact, reflexes equal bilaterally. Normal muscle tone, no cerebellar symptoms. Sensation intact.  Pysch: Alert and oriented X 3, normal affect, Insight and Judgment appropriate.   Assessment and Plan  1. Annual Preventative Screening Examination  2. Essential hypertension  - EKG 12-Lead - Urinalysis, Routine w reflex microscopic - Microalbumin / creatinine urine ratio - CBC with Differential/Platelet - COMPLETE METABOLIC PANEL WITH GFR - Magnesium - TSH  3. Hyperlipidemia, mixed  - EKG 12-Lead - Lipid panel - TSH  4. Abnormal glucose  - EKG 12-Lead - Hemoglobin A1c - Insulin, random  5. Vitamin D deficiency  - VITAMIN D 25 Hydroxyl  6. Prediabetes  - EKG 12-Lead - Hemoglobin A1c - Insulin, random  7. Gastroesophageal reflux disease  - CBC with Differential/Platelet  8. Screening for colorectal cancer  - POC Hemoccult Bld/Stl   9. Screening for ischemic heart disease  - EKG 12-Lead  10. FHx: heart disease  - EKG 12-Lead  11. Former smoker  - EKG 12-Lead  12. Aortic  atherosclerosis (HCC)  - EKG 12-Lead  13. Medication management  - Urinalysis, Routine w reflex microscopic - Microalbumin / creatinine urine ratio - CBC with Differential/Platelet - COMPLETE METABOLIC PANEL WITH GFR - Magnesium - Lipid panel - TSH - Hemoglobin A1c - Insulin, random - VITAMIN D 25 Hydroxyl        Patient was counseled in prudent diet to achieve/maintain BMI less than 25 for weight control, BP monitoring, regular exercise and medications. Discussed med's effects and SE's. Screening labs and tests as requested with regular follow-up as recommended. Over 40 minutes of exam, counseling, chart review and high complex critical decision making was performed.

## 2018-05-29 NOTE — Patient Instructions (Signed)

## 2018-05-30 LAB — LIPID PANEL
Cholesterol: 153 mg/dL (ref <200)
HDL: 61 mg/dL (ref >=50)
LDL Cholesterol (Calc): 74 mg/dL (calc)
Non-HDL Cholesterol (Calc): 92 mg/dL (calc) (ref <130)
Total CHOL/HDL Ratio: 2.5 (calc) (ref <5.0)
Triglycerides: 99 mg/dL (ref <150)

## 2018-05-30 LAB — INSULIN, RANDOM: INSULIN: 4.6 u[IU]/mL (ref 2.0–19.6)

## 2018-05-30 LAB — CBC WITH DIFFERENTIAL/PLATELET
Absolute Monocytes: 451 cells/uL (ref 200–950)
Basophils Absolute: 83 cells/uL (ref 0–200)
Basophils Relative: 1.5 %
Eosinophils Absolute: 259 cells/uL (ref 15–500)
Eosinophils Relative: 4.7 %
HCT: 37.1 % (ref 35.0–45.0)
Hemoglobin: 12.3 g/dL (ref 11.7–15.5)
Lymphs Abs: 1447 cells/uL (ref 850–3900)
MCH: 30.5 pg (ref 27.0–33.0)
MCHC: 33.2 g/dL (ref 32.0–36.0)
MCV: 92.1 fL (ref 80.0–100.0)
MPV: 10.6 fL (ref 7.5–12.5)
Monocytes Relative: 8.2 %
Neutro Abs: 3262 cells/uL (ref 1500–7800)
Neutrophils Relative %: 59.3 %
Platelets: 281 10*3/uL (ref 140–400)
RBC: 4.03 10*6/uL (ref 3.80–5.10)
RDW: 11.9 % (ref 11.0–15.0)
Total Lymphocyte: 26.3 %
WBC: 5.5 10*3/uL (ref 3.8–10.8)

## 2018-05-30 LAB — URINALYSIS, ROUTINE W REFLEX MICROSCOPIC
Bacteria, UA: NONE SEEN /HPF
Bilirubin Urine: NEGATIVE
Glucose, UA: NEGATIVE
Hgb urine dipstick: NEGATIVE
Hyaline Cast: NONE SEEN /LPF
Ketones, ur: NEGATIVE
Nitrite: NEGATIVE
Protein, ur: NEGATIVE
SPECIFIC GRAVITY, URINE: 1.016 (ref 1.001–1.03)
SQUAMOUS EPITHELIAL / LPF: NONE SEEN /HPF (ref <=5)
pH: 7 (ref 5.0–8.0)

## 2018-05-30 LAB — COMPLETE METABOLIC PANEL WITH GFR
AG Ratio: 1.7 (calc) (ref 1.0–2.5)
ALBUMIN MSPROF: 4.3 g/dL (ref 3.6–5.1)
ALT: 9 U/L (ref 6–29)
AST: 17 U/L (ref 10–35)
Alkaline phosphatase (APISO): 69 U/L (ref 37–153)
BUN: 8 mg/dL (ref 7–25)
CO2: 25 mmol/L (ref 20–32)
CREATININE: 0.79 mg/dL (ref 0.60–0.93)
Calcium: 10 mg/dL (ref 8.6–10.4)
Chloride: 106 mmol/L (ref 98–110)
GFR, Est African American: 87 mL/min/{1.73_m2} (ref >=60)
GFR, Est Non African American: 75 mL/min/{1.73_m2} (ref >=60)
GLOBULIN: 2.6 g/dL (ref 1.9–3.7)
Glucose, Bld: 90 mg/dL (ref 65–99)
Potassium: 3.8 mmol/L (ref 3.5–5.3)
Sodium: 140 mmol/L (ref 135–146)
Total Bilirubin: 0.5 mg/dL (ref 0.2–1.2)
Total Protein: 6.9 g/dL (ref 6.1–8.1)

## 2018-05-30 LAB — HEMOGLOBIN A1C
Hgb A1c MFr Bld: 5.5 % of total Hgb (ref <5.7)
Mean Plasma Glucose: 111 (calc)
eAG (mmol/L): 6.2 (calc)

## 2018-05-30 LAB — VITAMIN D 25 HYDROXY (VIT D DEFICIENCY, FRACTURES): VIT D 25 HYDROXY: 91 ng/mL (ref 30–100)

## 2018-05-30 LAB — MAGNESIUM: Magnesium: 2.2 mg/dL (ref 1.5–2.5)

## 2018-05-30 LAB — MICROALBUMIN / CREATININE URINE RATIO
CREATININE, URINE: 145 mg/dL (ref 20–275)
Microalb Creat Ratio: 30 mcg/mg creat — ABNORMAL HIGH (ref <30)
Microalb, Ur: 4.3 mg/dL

## 2018-05-30 LAB — TSH: TSH: 2.64 mIU/L (ref 0.40–4.50)

## 2018-06-01 ENCOUNTER — Encounter: Payer: Self-pay | Admitting: Internal Medicine

## 2018-06-13 ENCOUNTER — Other Ambulatory Visit: Payer: Self-pay | Admitting: Internal Medicine

## 2018-07-02 ENCOUNTER — Other Ambulatory Visit: Payer: Self-pay | Admitting: Adult Health

## 2018-07-02 DIAGNOSIS — E782 Mixed hyperlipidemia: Secondary | ICD-10-CM

## 2018-09-02 NOTE — Progress Notes (Signed)
MEDICARE ANNUAL WELLNESS VISIT AND FOLLOW UP  Assessment:   Deborah [name] was seen today for follow-up and medicare wellness.  Diagnoses and all orders for this visit:  Medicare annual wellness visit, subsequent Yearly  Essential hypertension Controlled today -     CBC with Differential/Platelet -     COMPLETE METABOLIC PANEL WITH GFR -     Magnesium  Hyperlipidemia, mixed Continue Zetia 10mg  and Crestor 20mg  -     Lipid panel  Gastroesophageal reflux disease, esophagitis presence not specified Doing well at this time, continue to monitor diet and triggers No medications -     Magnesium  Abnormal glucose Discussed dieta dn exercise modifications -     Hemoglobin A1c -     Insulin, random  Vitamin D deficiency Continue supplementation -     VITAMIN D 25 Hydroxy (Vit-D Deficiency, Fractures)  Aortic atherosclerosis (HCC)  Migraine without aura and without status migrainosus, not intractable Doing well on current regiment Continue sumatriptan PRN, has not used this year Taking Topamax 100mg  half tablet BID  Generalized anxiety disorder Increasing in duration Continue Alprazolam 0.5mg , half tablet a daily. Discussed less than 5 days a week, she has struggled with this. Taking Lexapro 5mg  daily, increase to 10mg  daily with close follow up.   Osteopenia of multiple sites -     VITAMIN D 25 Hydroxy (Vit-D Deficiency, Fractures) DEXA UTD  BMI 23.0-23.9, adult -     Hemoglobin A1c -     Insulin, random   Over 40 minutes of exam, counseling, chart review and critical decision making was performed  Future Appointments  Date Time Provider Deborah [name]  12/10/2018 10:30 AM Deborah Pinto, MD [facility] None  06/24/2019 10:00 AM Deborah Pinto, MD [facility] None    Plan:   During the course of the visit the patient was educated and counseled about appropriate screening and preventive services including:    Pneumococcal vaccine   Prevnar 13  Influenza  vaccine  Td vaccine  Screening electrocardiogram  Bone densitometry screening  Colorectal cancer screening  Diabetes screening  Glaucoma screening  Nutrition counseling   Advanced directives: requested   Subjective:  Deborah [name] is a 73 y.o. female who presents for Medicare Annual Wellness Visit and 3 month follow up.     Her blood pressure has been controlled at home, today their BP is BP: 106/70 She does workout. She denies chest pain, shortness of breath, dizziness.  She is on cholesterol medication and denies myalgias. Her cholesterol is at goal. The cholesterol last visit was:   Lab Results  Component Value Date   CHOL 153 05/29/2018   HDL 61 05/29/2018   LDLCALC 74 05/29/2018   TRIG 99 05/29/2018   CHOLHDL 2.5 05/29/2018   . She has been working on diet and exercise for abnormal glucose and denies increased appetite, paresthesia of the feet, polydipsia, polyuria, visual disturbances and vomiting. Last A1C in the office was:  Lab Results  Component Value Date   HGBA1C 5.5 05/29/2018   Last GFR:   Lab Results  Component Value Date   GFRNONAA 75 05/29/2018   Lab Results  Component Value Date   GFRAA 87 05/29/2018   Patient is on Vitamin D supplement.   Lab Results  Component Value Date   VD25OH 91 05/29/2018      Medication Review: Current Outpatient Medications on File Prior to Visit  Medication Sig Dispense Refill  . ALPRAZolam (XANAX) 0.5 MG tablet Take 1/2-1 tablet 2 - 3  x /day ONLY if needed for Anxiety Attack &  limit to 5 days /week to avoid addiction 60 tablet 0  . aspirin EC 325 MG tablet Take 1 tablet (325 mg total) by mouth 2 (two) times daily after a meal. Take x 1 month post op to decrease risk of blood clots. (Patient taking differently: Take 325 mg by mouth as needed for mild pain. ) 60 tablet 0  . Cholecalciferol (VITAMIN D3) 125 MCG (5000 UT) CAPS Take 5,000 Units by mouth daily.    Marland Kitchen escitalopram (LEXAPRO) 5 MG tablet Take 1 tablet  daily for prevention of Chronic Anxiety & Panic Attacks 90 tablet 1  . ezetimibe (ZETIA) 10 MG tablet TAKE 1 TABLET BY MOUTH ONCE DAILY FOR CHOLESTEROL 90 tablet 0  . fluticasone (FLONASE) 50 MCG/ACT nasal spray Place 2 sprays into both nostrils daily as needed for allergies. 16 g 3  . Multiple Vitamins-Minerals (MULTIVITAMIN PO) Take 1 tablet by mouth daily.    . rosuvastatin (CRESTOR) 20 MG tablet TAKE 1 TABLET BY MOUTH ONCE DAILY 90 tablet 0  . SUMAtriptan (IMITREX) 20 MG/ACT nasal spray USE ONE DOSE IN THE NOSE NOW, MAY REPEAT IN 2 HOURS. MAX OF 2 DOSES IN 24 HOURS. (Patient taking differently: Place 20 mg into the nose every 2 (two) hours as needed for migraine. ) 12 Inhaler 2  . topiramate (TOPAMAX) 100 MG tablet Take 1/2 tablet 2 x /day at Supper & Bedtime 90 tablet 1   No current facility-administered medications on file prior to visit.     Allergies  Allergen Reactions  . Banana Nausea And Vomiting  . Chocolate Other (See Comments)    Unknown  . Effexor [Venlafaxine] Other (See Comments)    insomnia  . Maxalt [Rizatriptan Benzoate] Nausea Only and Other (See Comments)    Nausea and excessive salivation  . Other Other (See Comments)    NUTS (all kinds)  . Tussin [Guaifenesin] Rash  . Zostavax [Zoster Vaccine Live] Itching and Rash    Current Problems (verified) Patient Active Problem List   Diagnosis Date Noted  . Syncope 03/25/2018  . Delusions (Deborah [name]) 03/24/2018  . Primary osteoarthritis of left knee 03/22/2018  . GERD (gastroesophageal reflux disease) 02/25/2018  . Osteopenia 02/25/2018  . Compression fracture of T12 vertebra (Deborah [name]) 05/30/2017  . Aortic atherosclerosis (Deborah [name]) 02/03/2017  . Seizure disorder (Deborah [name]) 12/15/2016  . History of TIA (transient ischemic attack) 12/15/2016  . BMI 24.0-24.9, adult 03/12/2015  . Skin cancer 08/06/2014  . Migraine 08/06/2014  . Generalized anxiety disorder 08/06/2014  . Incontinence in female 08/06/2014  . Prediabetes 07/01/2013   . Medication management 07/01/2013  . Hypertension   . Hyperlipidemia   . Vitamin D deficiency     Screening Tests Immunization History  Administered Date(s) Administered  . Influenza, High Dose Seasonal PF 01/01/2014, 03/15/2015, 12/13/2015, 01/03/2017, 02/05/2018  . Influenza-Unspecified 12/26/2012  . Pneumococcal Conjugate-13 08/06/2014  . Pneumococcal-Unspecified 12/26/2012  . Td 12/26/2012  . Tdap 05/08/2017  . Zoster 12/08/2010    Preventative care: Last colonoscopy: 2019 Last mammogram: 2019, Due for 2020  DEXA: 2019  Prior vaccinations: TD or Tdap: 2019  Influenza: 2019 Pneumococcal: 2013 Prevnar13: 2016 Shingles/Zostavax: 2012  Names of Other Physician/Practitioners you currently use: 1. [facility] here for primary care 2. Eye Exam 2018, Due for 2020 3. Dental Exam 2019, Due for 2020  Patient Care Team: Deborah Pinto, MD as PCP - General (Internal Medicine) Richmond Campbell, MD as Consulting Physician (  Gastroenterology) Shirley Muscat Loreen Freud, MD as Referring Physician (Optometry) Berenice Primas, MD as Referring Physician (Orthopedic Surgery) Janann August, MD as Referring Physician (Dermatology)  SURGICAL HISTORY She  has a past surgical history that includes Spine surgery (2010); Mohs surgery (Right, 04/2013); Knee arthroscopy (Bilateral, 04/2014); Appendectomy (1959); Cesarean section (1978); Cataract extraction, bilateral (Bilateral, 2014); Colonoscopy w/ polypectomy (08/02/2017); Upper gi endoscopy (10/16/2016); and Total knee arthroplasty (Left, 03/22/2018). FAMILY HISTORY Her family history includes Diabetes in her mother; Heart disease in her father and mother; Hyperlipidemia in her sister; Hypertension in her brother and father; Stroke in her father. SOCIAL HISTORY She  reports that she quit smoking about 32 years ago. Her smoking use included cigarettes. She has a 4.00 pack-year smoking history. She has never  used smokeless tobacco. She reports previous alcohol use. She reports that she does not use drugs.   MEDICARE WELLNESS OBJECTIVES: Physical activity: Current Exercise Habits: Home exercise routine(Walking and yard work), Type of exercise: walking, Time (Minutes): 30, Frequency (Times/Week): 5, Weekly Exercise (Minutes/Week): 150, Intensity: Mild, Exercise limited by: None identified Cardiac risk factors: Cardiac Risk Factors include: none Depression/mood screen:   Depression screen [facility] 2/9 09/03/2018  Decreased Interest 0  Down, Depressed, Hopeless 0  PHQ - 2 Score 0    ADLs:  In your present state of health, do you have any difficulty performing the following activities: 09/03/2018 05/29/2018  Hearing? N N  Vision? N N  Difficulty concentrating or making decisions? N N  Comment - -  Walking or climbing stairs? N N  Comment - -  Dressing or bathing? N N  Doing errands, shopping? N N  Preparing Food and eating ? N -  Using the Toilet? N -  In the past six months, have you accidently leaked urine? N -  Do you have problems with loss of bowel control? N -  Managing your Medications? N -  Managing your Finances? N -  Housekeeping or managing your Housekeeping? N -  Some recent data might be hidden     Cognitive Testing  Alert? Yes  Normal Appearance?Yes  Oriented to person? Yes  Place? Yes   Time? Yes  Recall of three objects?  Yes  Can perform simple calculations? Yes  Displays appropriate judgment?Yes  Can read the correct time from a watch face?Yes  EOL planning: Does Patient Have a Medical Advance Directive?: No Would patient like information on creating a medical advance directive?: Yes (Inpatient - patient defers creating a medical advance directive at this time - Information given)  Review of Systems  Constitutional: Negative for chills, diaphoresis, fever, malaise/fatigue and weight loss.  HENT: Negative for congestion, ear discharge, ear pain, hearing loss, nosebleeds,  sinus pain, sore throat and tinnitus.   Eyes: Negative for blurred vision, double vision, photophobia, pain, discharge and redness.  Respiratory: Negative for cough, hemoptysis, sputum production, shortness of breath, wheezing and stridor.   Cardiovascular: Negative for chest pain, palpitations, orthopnea, claudication, leg swelling and PND.  Gastrointestinal: Negative for abdominal pain, blood in stool, constipation, diarrhea, heartburn, melena, nausea and vomiting.  Genitourinary: Negative for dysuria, flank pain, frequency, hematuria and urgency.  Musculoskeletal: Negative for back pain, falls, joint pain, myalgias and neck pain.  Skin: Negative for itching and rash.  Neurological: Negative for dizziness, tingling, tremors, sensory change, speech change, focal weakness, seizures, loss of consciousness, weakness and headaches.  Endo/Heme/Allergies: Negative for environmental allergies and polydipsia. Does not bruise/bleed easily.  Psychiatric/Behavioral: Negative for depression, hallucinations, memory loss, substance abuse  and suicidal ideas. The patient is nervous/anxious. The patient does not have insomnia.        Reports daily panic attacks.       Objective:     Today's Vitals   09/03/18 1112  BP: 106/70  Pulse: 83  Temp: (!) 97.3 F (36.3 C)  SpO2: 98%  Weight: 145 lb (65.8 kg)  Height: 5' 6.5" (1.689 m)   Body mass index is 23.05 kg/m.  General appearance: alert, no distress, WD/WN, female HEENT: normocephalic, sclerae anicteric, TMs pearly, nares patent, no discharge or erythema, pharynx normal Oral cavity: MMM, no lesions Neck: supple, no lymphadenopathy, no thyromegaly, no masses Heart: RRR, normal S1, S2, no murmurs Lungs: CTA bilaterally, no wheezes, rhonchi, or rales Abdomen: +bs, soft, non tender, non distended, no masses, no hepatomegaly, no splenomegaly Musculoskeletal: nontender, no swelling, no obvious deformity Extremities: no edema, no cyanosis, no  clubbing Pulses: 2+ symmetric, upper and lower extremities, normal cap refill Neurological: alert, oriented x 3, CN2-12 intact, strength normal upper extremities and lower extremities, sensation normal throughout, DTRs 2+ throughout, no cerebellar signs, gait normal Psychiatric: normal affect, behavior normal, pleasant   Medicare Attestation I have personally reviewed: The patient's medical and social history Their use of alcohol, tobacco or illicit drugs Their current medications and supplements The patient's functional ability including ADLs,fall risks, home safety risks, cognitive, and hearing and visual impairment Diet and physical activities Evidence for depression or mood disorders  The patient's weight, height, BMI, and visual acuity have been recorded in the chart.  I have made referrals, counseling, and provided education to the patient based on review of the above and I have provided the patient with a written personalized care plan for preventive services.     Garnet Sierras, NP [facility] 09/03/2018  12:14 PM

## 2018-09-03 ENCOUNTER — Encounter: Payer: Self-pay | Admitting: Adult Health Nurse Practitioner

## 2018-09-03 ENCOUNTER — Other Ambulatory Visit: Payer: Self-pay

## 2018-09-03 ENCOUNTER — Ambulatory Visit: Payer: Self-pay | Admitting: Adult Health

## 2018-09-03 ENCOUNTER — Ambulatory Visit: Payer: Medicare Other | Admitting: Adult Health Nurse Practitioner

## 2018-09-03 VITALS — BP 106/70 | HR 83 | Temp 97.3°F | Ht 66.5 in | Wt 145.0 lb

## 2018-09-03 DIAGNOSIS — R6889 Other general symptoms and signs: Secondary | ICD-10-CM

## 2018-09-03 DIAGNOSIS — I10 Essential (primary) hypertension: Secondary | ICD-10-CM

## 2018-09-03 DIAGNOSIS — F411 Generalized anxiety disorder: Secondary | ICD-10-CM

## 2018-09-03 DIAGNOSIS — Z0001 Encounter for general adult medical examination with abnormal findings: Secondary | ICD-10-CM | POA: Diagnosis not present

## 2018-09-03 DIAGNOSIS — M8589 Other specified disorders of bone density and structure, multiple sites: Secondary | ICD-10-CM

## 2018-09-03 DIAGNOSIS — E782 Mixed hyperlipidemia: Secondary | ICD-10-CM

## 2018-09-03 DIAGNOSIS — Z0000 Encounter for general adult medical examination without abnormal findings: Secondary | ICD-10-CM

## 2018-09-03 DIAGNOSIS — R7309 Other abnormal glucose: Secondary | ICD-10-CM

## 2018-09-03 DIAGNOSIS — Z6823 Body mass index (BMI) 23.0-23.9, adult: Secondary | ICD-10-CM

## 2018-09-03 DIAGNOSIS — K219 Gastro-esophageal reflux disease without esophagitis: Secondary | ICD-10-CM

## 2018-09-03 DIAGNOSIS — E559 Vitamin D deficiency, unspecified: Secondary | ICD-10-CM | POA: Diagnosis not present

## 2018-09-03 DIAGNOSIS — I700 Atherosclerosis of aorta: Secondary | ICD-10-CM

## 2018-09-03 DIAGNOSIS — G43009 Migraine without aura, not intractable, without status migrainosus: Secondary | ICD-10-CM

## 2018-09-03 NOTE — Patient Instructions (Addendum)
Deborah [name] , Thank you for taking time to come for your Medicare Wellness Visit. I appreciate your ongoing commitment to your health goals. Please review the following plan we discussed and let me know if I can assist you in the future.   These are the goals we discussed: Goals    . Exercise 150 min/wk Moderate Activity     Weight bearing/resistance exercises twice weekly       This is a list of the screening recommended for you and due dates:  Health Maintenance  Topic Date Due  . Flu Shot  11/16/2018  . Colon Cancer Screening  08/03/2019  . Mammogram  09/26/2019  . Tetanus Vaccine  05/09/2027  . DEXA scan (bone density measurement)  Completed  .  Hepatitis C: One time screening is recommended by Center for Disease Control  (CDC) for  adults born from [birth year] through 1965.   Completed  . Pneumonia vaccines  Completed    Once the COVID19 restrictions are lifted, schedule or eye and dental exams  You are due for your screening Mammogram as well.   We will contact you in 1-3 days with your lab results via [facility].  Today we discussed increasing your Lexapro (escitalopram).  You are currently taking one tablet 52m.  Start taking 168m(two tablets daily).  If you run out of medication before our follow up please let usKoreanow.  We will follow up with a phone visit in 3 weeks.  If you have any questions or concerns please contact the office.     >>>>>>>>>>>>>>>>>>>>>>>>>>>>>>>>>>>>>>>>>>>>>>>>>>>>>>> Coronavirus (COVID-19) Are you at risk?  Are you at risk for the Coronavirus (COVID-19)?  To be considered HIGH RISK for Coronavirus (COVID-19), you have to meet the following criteria:  . Traveled to [place] have fever, cough, and shortness of breath within the last 2 weeks of travel OR . Been in close contact with a person diagnosed with COVID-19 within the last 2  weeks and have  . fever, cough,and shortness of breath .  . IF YOU DO NOT MEET THESE CRITERIA, YOU ARE CONSIDERED LOW RISK FOR COVID-19.  What to do if you are HIGH RISK for COVID-19?  . Marland Kitchenf you are having a medical emergency, call 911. . Seek medical care right away. Before you go to a doctor's office, urgent care or emergency department, .  call ahead and tell them about your recent travel, contact with someone diagnosed with COVID-19  .  and your symptoms.  . You should receive instructions from your physician's office regarding next steps of care.  . When you arrive at healthcare provider, tell the healthcare staff immediately you have returned from  . visiting [place] the last two weeks or you have been in close contact with a person diagnosed with  . COVID-19 in the last 2 weeks.   . Tell the health care staff about your symptoms: fever, cough and shortness of breath. . After you have been seen by a medical provider, you will be either: o Tested for (COVID-19) and discharged home on quarantine except to seek medical care if  o symptoms worsen, and asked to  - Stay home and avoid contact with others until you get your  results (4-5 days)  - Avoid travel on public transportation if possible (such as bus, train, or airplane) or o Sent to the Emergency Department by EMS for evaluation, COVID-19 testing  and  o possible admission depending on your condition and test results.  What to do if you are LOW RISK for COVID-19?  Reduce your risk of any infection by using the same precautions used for avoiding the common cold or flu:  Marland Kitchen Wash your hands often with soap and warm water for at least 20 seconds.  If soap and water are not readily available,  . use an alcohol-based hand sanitizer with at least 60% alcohol.  . If coughing or sneezing, cover your mouth and nose by  coughing or sneezing into the elbow areas of your shirt or coat, .  into a tissue or into your sleeve (not your hands). . Avoid shaking hands with others and consider head nods or verbal greetings only. . Avoid touching your eyes, nose, or mouth with unwashed hands.  . Avoid close contact with people who are sick. . Avoid places or events with large numbers of people in one location, like concerts or sporting events. . Carefully consider travel plans you have or are making. . If you are planning any travel outside or inside the [place], visit the CDC's Travelers' Health webpage for the latest health notices. . If you have some symptoms but not all symptoms, continue to monitor at home and seek medical attention  . if your symptoms worsen. . If you are having a medical emergency, call 911.   . >>>>>>>>>>>>>>>>>>>>>>>>>>>>>>>>> . We Do NOT Approve of  [facility], [employer]  Our Patients  To Do Home Visits & We Do NOT Approve of [employer] > > > > > > > > > > > > > > > > > > > > > > > > > > > > > > > > > > > > > > >  Preventive Care for Adults  A healthy lifestyle and preventive care can promote health and wellness. Preventive health guidelines for women include the following key practices.  A routine yearly physical is a good way to check with your health care provider about your health and preventive screening. It is a chance to share any concerns and updates on your health and to receive a thorough exam.  Visit your dentist for a routine exam and preventive care every 6 months. Brush your teeth twice a day and floss once a day. Good oral hygiene prevents tooth decay and gum disease.  The frequency of eye exams is based on your age, health, family medical history, use of contact lenses, and other factors. Follow your health care provider's recommendations for frequency of eye exams.  Eat a healthy diet. Foods like vegetables, fruits, whole grains, low-fat dairy  products, and lean protein foods contain the nutrients you need without too many calories. Decrease your intake of foods high in solid fats, added sugars, and salt. Eat the right amount of calories for you. Get information about a proper diet from your health care provider, if necessary.  Regular physical exercise is one of the most important things you can do for your health. Most adults should get at least 150 minutes of moderate-intensity exercise (any activity that increases your heart rate and causes you to sweat) each week. In addition, most adults need muscle-strengthening exercises on 2 or more days a week.  Maintain a healthy weight. The body mass  index (BMI) is a screening tool to identify possible weight problems. It provides an estimate of body fat based on height and weight. Your health care provider can find your BMI and can help you achieve or maintain a healthy weight. For adults 20 years and older:  A BMI below 18.5 is considered underweight.  A BMI of 18.5 to 24.9 is normal.  A BMI of 25 to 29.9 is considered overweight.  A BMI of 30 and above is considered obese.  Maintain normal blood lipids and cholesterol levels by exercising and minimizing your intake of saturated fat. Eat a balanced diet with plenty of fruit and vegetables. If your lipid or cholesterol levels are high, you are over 50, or you are at high risk for heart disease, you may need your cholesterol levels checked more frequently. Ongoing high lipid and cholesterol levels should be treated with medicines if diet and exercise are not working.  If you smoke, find out from your health care provider how to quit. If you do not use tobacco, do not start.  Lung cancer screening is recommended for adults aged 16-80 years who are at high risk for developing lung cancer because of a history of smoking. A yearly low-dose CT scan of the lungs is recommended for people who have at least a 30-pack-year history of smoking and are a  current smoker or have quit within the past 15 years. A pack year of smoking is smoking an average of 1 pack of cigarettes a day for 1 year (for example: 1 pack a day for 30 years or 2 packs a day for 15 years). Yearly screening should continue until the smoker has stopped smoking for at least 15 years. Yearly screening should be stopped for people who develop a health problem that would prevent them from having lung cancer treatment.  Avoid use of street drugs. Do not share needles with anyone. Ask for help if you need support or instructions about stopping the use of drugs.  High blood pressure causes heart disease and increases the risk of stroke.  Ongoing high blood pressure should be treated with medicines if weight loss and exercise do not work.  If you are 93-58 years old, ask your health care provider if you should take aspirin to prevent strokes.  Diabetes screening involves taking a blood sample to check your fasting blood sugar level. This should be done once every 3 years, after age 18, if you are within normal weight and without risk factors for diabetes. Testing should be considered at a younger age or be carried out more frequently if you are overweight and have at least 1 risk factor for diabetes.  Breast cancer screening is essential preventive care for women. You should practice "breast self-awareness." This means understanding the normal appearance and feel of your breasts and may include breast self-examination. Any changes detected, no matter how small, should be reported to a health care provider. Women in their 72s and 30s should have a clinical breast exam (CBE) by a health care provider as part of a regular health exam every 1 to 3 years. After age 67, women should have a CBE every year. Starting at age 50, women should consider having a mammogram (breast X-ray test) every year. Women who have a family history of breast cancer should talk to their health care provider about genetic  screening. Women at a high risk of breast cancer should talk to their health care providers about having an MRI and a  mammogram every year.  Breast cancer gene (BRCA)-related cancer risk assessment is recommended for women who have family members with BRCA-related cancers. BRCA-related cancers include breast, ovarian, tubal, and peritoneal cancers. Having family members with these cancers may be associated with an increased risk for harmful changes (mutations) in the breast cancer genes BRCA1 and BRCA2. Results of the assessment will determine the need for genetic counseling and BRCA1 and BRCA2 testing.  Routine pelvic exams to screen for cancer are no longer recommended for nonpregnant women who are considered low risk for cancer of the pelvic organs (ovaries, uterus, and vagina) and who do not have symptoms. Ask your health care provider if a screening pelvic exam is right for you.  If you have had past treatment for cervical cancer or a condition that could lead to cancer, you need Pap tests and screening for cancer for at least 20 years after your treatment. If Pap tests have been discontinued, your risk factors (such as having a new sexual partner) need to be reassessed to determine if screening should be resumed. Some women have medical problems that increase the chance of getting cervical cancer. In these cases, your health care provider may recommend more frequent screening and Pap tests.    Colorectal cancer can be detected and often prevented. Most routine colorectal cancer screening begins at the age of 22 years and continues through age 14 years. However, your health care provider may recommend screening at an earlier age if you have risk factors for colon cancer. On a yearly basis, your health care provider may provide home test kits to check for hidden blood in the stool. Use of a small camera at the end of a tube, to directly examine the colon (sigmoidoscopy or colonoscopy), can detect the  earliest forms of colorectal cancer. Talk to your health care provider about this at age 62, when routine screening begins.  Direct exam of the colon should be repeated every 5-10 years through age 59 years, unless early forms of pre-cancerous polyps or small growths are found.  Osteoporosis is a disease in which the bones lose minerals and strength with aging. This can result in serious bone fractures or breaks. The risk of osteoporosis can be identified using a bone density scan. Women ages 76 years and over and women at risk for fractures or osteoporosis should discuss screening with their health care providers. Ask your health care provider whether you should take a calcium supplement or vitamin D to reduce the rate of osteoporosis.  Menopause can be associated with physical symptoms and risks. Hormone replacement therapy is available to decrease symptoms and risks. You should talk to your health care provider about whether hormone replacement therapy is right for you.  Use sunscreen. Apply sunscreen liberally and repeatedly throughout the day. You should seek shade when your shadow is shorter than you. Protect yourself by wearing long sleeves, pants, a wide-brimmed hat, and sunglasses year round, whenever you are outdoors.  Once a month, do a whole body skin exam, using a mirror to look at the skin on your back. Tell your health care provider of new moles, moles that have irregular borders, moles that are larger than a pencil eraser, or moles that have changed in shape or color.  Stay current with required vaccines (immunizations).  Influenza vaccine. All adults should be immunized every year.  Tetanus, diphtheria, and acellular pertussis (Td, Tdap) vaccine. Pregnant women should receive 1 dose of Tdap vaccine during each pregnancy. The dose should be  obtained regardless of the length of time since the last dose. Immunization is preferred during the 27th-36th week of gestation. An adult who has  not previously received Tdap or who does not know her vaccine status should receive 1 dose of Tdap. This initial dose should be followed by tetanus and diphtheria toxoids (Td) booster doses every 10 years. Adults with an unknown or incomplete history of completing a 3-dose immunization series with Td-containing vaccines should begin or complete a primary immunization series including a Tdap dose. Adults should receive a Td booster every 10 years.    Zoster vaccine. One dose is recommended for adults aged 2 years or older unless certain conditions are present.    Pneumococcal 13-valent conjugate (PCV13) vaccine. When indicated, a person who is uncertain of her immunization history and has no record of immunization should receive the PCV13 vaccine. An adult aged 8 years or older who has certain medical conditions and has not been previously immunized should receive 1 dose of PCV13 vaccine. This PCV13 should be followed with a dose of pneumococcal polysaccharide (PPSV23) vaccine. The PPSV23 vaccine dose should be obtained at least 1 or more year(s) after the dose of PCV13 vaccine. An adult aged 61 years or older who has certain medical conditions and previously received 1 or more doses of PPSV23 vaccine should receive 1 dose of PCV13. The PCV13 vaccine dose should be obtained 1 or more years after the last PPSV23 vaccine dose.    Pneumococcal polysaccharide (PPSV23) vaccine. When PCV13 is also indicated, PCV13 should be obtained first. All adults aged 76 years and older should be immunized. An adult younger than age 46 years who has certain medical conditions should be immunized. Any person who resides in a nursing home or long-term care facility should be immunized. An adult smoker should be immunized. People with an immunocompromised condition and certain other conditions should receive both PCV13 and PPSV23 vaccines. People with human immunodeficiency virus (HIV) infection should be immunized as soon  as possible after diagnosis. Immunization during chemotherapy or radiation therapy should be avoided. Routine use of PPSV23 vaccine is not recommended for American Indians, [place]atives, or people younger than 65 years unless there are medical conditions that require PPSV23 vaccine. When indicated, people who have unknown immunization and have no record of immunization should receive PPSV23 vaccine. One-time revaccination 5 years after the first dose of PPSV23 is recommended for people aged 19-64 years who have chronic kidney failure, nephrotic syndrome, asplenia, or immunocompromised conditions. People who received 1-2 doses of PPSV23 before age 39 years should receive another dose of PPSV23 vaccine at age 48 years or later if at least 5 years have passed since the previous dose. Doses of PPSV23 are not needed for people immunized with PPSV23 at or after age 53 years.   Preventive Services / Frequency  Ages 79 years and over  Blood pressure check.  Lipid and cholesterol check.  Lung cancer screening. / Every year if you are aged 71-80 years and have a 30-pack-year history of smoking and currently smoke or have quit within the past 15 years. Yearly screening is stopped once you have quit smoking for at least 15 years or develop a health problem that would prevent you from having lung cancer treatment.  Clinical breast exam.** / Every year after age 46 years.   BRCA-related cancer risk assessment.** / For women who have family members with a BRCA-related cancer (breast, ovarian, tubal, or peritoneal cancers).  Mammogram.** / Every year beginning  at age 50 years and continuing for as long as you are in good health. Consult with your health care provider.  Pap test.** / Every 3 years starting at age 97 years through age 39 or 29 years with 3 consecutive normal Pap tests. Testing can be stopped between 65 and 70 years with 3 consecutive normal Pap tests and no abnormal Pap or HPV tests in the past  10 years.  Fecal occult blood test (FOBT) of stool. / Every year beginning at age 48 years and continuing until age 67 years. You may not need to do this test if you get a colonoscopy every 10 years.  Flexible sigmoidoscopy or colonoscopy.** / Every 5 years for a flexible sigmoidoscopy or every 10 years for a colonoscopy beginning at age 73 years and continuing until age 26 years.  Hepatitis C blood test.** / For all people born from [birth year] through 1965 and any individual with known risks for hepatitis C.  Osteoporosis screening.** / A one-time screening for women ages 62 years and over and women at risk for fractures or osteoporosis.  Skin self-exam. / Monthly.  Influenza vaccine. / Every year.  Tetanus, diphtheria, and acellular pertussis (Tdap/Td) vaccine.** / 1 dose of Td every 10 years.  Zoster vaccine.** / 1 dose for adults aged 87 years or older.  Pneumococcal 13-valent conjugate (PCV13) vaccine.** / Consult your health care provider.  Pneumococcal polysaccharide (PPSV23) vaccine.** / 1 dose for all adults aged 39 years and older. Screening for abdominal aortic aneurysm (AAA)  by ultrasound is recommended for people who have history of high blood pressure or who are current or former smokers. ++++++++++++++++++++ Recommend Adult Low Dose Aspirin or  coated  Aspirin 81 mg daily  To reduce risk of Colon Cancer 20 %,  Skin Cancer 26 % ,  Melanoma 46%  and  Pancreatic cancer 60% ++++++++++++++++++++ Vitamin D goal  is between 70-100.  Please make sure that you are taking your Vitamin D as directed.  It is very important as a natural anti-inflammatory  helping hair, skin, and nails, as well as reducing stroke and heart attack risk.  It helps your bones and helps with mood. It also decreases numerous cancer risks so please take it as directed.  Low Vit D is associated with a 200-300% higher risk for CANCER  and 200-300% higher risk for HEART   ATTACK  &  STROKE.    .....................................Marland Kitchen It is also associated with higher death rate at younger ages,  autoimmune diseases like Rheumatoid arthritis, Lupus, Multiple Sclerosis.    Also many other serious conditions, like depression, Alzheimer's Dementia, infertility, muscle aches, fatigue, fibromyalgia - just to name a few. ++++++++++++++++++ Recommend the book "The END of DIETING" by Dr Excell Seltzer  & the book "The END of DIABETES " by Dr Excell Seltzer At [email or web address] - get book & Audio CD's    Being diabetic has a  300% increased risk for heart attack, stroke, cancer, and alzheimer- type vascular dementia. It is very important that you work harder with diet by avoiding all foods that are white. Avoid white rice (brown & wild rice is OK), white potatoes (sweetpotatoes in moderation is OK), White bread or wheat bread or anything made out of white flour like bagels, donuts, rolls, buns, biscuits, cakes, pastries, cookies, pizza crust, and pasta (made from white flour & egg whites) - vegetarian pasta or spinach or wheat pasta is OK. Multigrain breads like Arnold's or Pepperidge Farm, or multigrain sandwich thins  or flatbreads.  Diet, exercise and weight loss can reverse and cure diabetes in the early stages.  Diet, exercise and weight loss is very important in the control and prevention of complications of diabetes which affects every system in your body, ie. Brain - dementia/stroke, eyes - glaucoma/blindness, heart - heart attack/heart failure, kidneys - dialysis, stomach - gastric paralysis, intestines - malabsorption, nerves - severe painful neuritis, circulation - gangrene & loss of a leg(s), and finally cancer and Alzheimers.    I recommend avoid fried & greasy foods,  sweets/candy, white rice (brown or wild rice or Quinoa is OK), white potatoes (sweet potatoes are OK) - anything made from white flour - bagels, doughnuts, rolls, buns, biscuits,white and wheat breads, pizza crust and traditional pasta  made of white flour & egg white(vegetarian pasta or spinach or wheat pasta is OK).  Multi-grain bread is OK - like multi-grain flat bread or sandwich thins. Avoid alcohol in excess. Exercise is also important.    Eat all the vegetables you want - avoid meat, especially red meat and dairy - especially cheese.  Cheese is the most concentrated form of trans-fats which is the worst thing to clog up our arteries. Veggie cheese is OK which can be found in the fresh produce section at [employer] or [employer] or [employer]  +++++++++++++++++++ DASH Eating Plan  DASH stands for "Dietary Approaches to Stop Hypertension."   The DASH eating plan is a healthy eating plan that has been shown to reduce high blood pressure (hypertension). Additional health benefits may include reducing the risk of type 2 diabetes mellitus, heart disease, and stroke. The DASH eating plan may also help with weight loss. WHAT DO I NEED TO KNOW ABOUT THE DASH EATING PLAN? For the DASH eating plan, you will follow these general guidelines:  Choose foods with a percent daily value for sodium of less than 5% (as listed on the food label).  Use salt-free seasonings or herbs instead of table salt or sea salt.  Check with your health care provider or pharmacist before using salt substitutes.  Eat lower-sodium products, often labeled as "lower sodium" or "no salt added."  Eat fresh foods.  Eat more vegetables, fruits, and low-fat dairy products.  Choose whole grains. Look for the word "whole" as the first word in the ingredient list.  Choose fish   Limit sweets, desserts, sugars, and sugary drinks.  Choose heart-healthy fats.  Eat veggie cheese   Eat more home-cooked food and less restaurant, buffet, and fast food.  Limit fried foods.  Cook foods using methods other than frying.  Limit canned vegetables. If you do use them, rinse them well to decrease the sodium.  When eating at a restaurant, ask that your food  be prepared with less salt, or no salt if possible.                      WHAT FOODS CAN I EAT? Read Dr Fara Olden Fuhrman's books on The End of Dieting & The End of Diabetes  Grains Whole grain or whole wheat bread. Brown rice. Whole grain or whole wheat pasta. Quinoa, bulgur, and whole grain cereals. Low-sodium cereals. Corn or whole wheat flour tortillas. Whole grain cornbread. Whole grain crackers. Low-sodium crackers.  Vegetables Fresh or frozen vegetables (raw, steamed, roasted, or grilled). Low-sodium or reduced-sodium tomato and vegetable juices. Low-sodium or reduced-sodium tomato sauce and paste. Low-sodium or reduced-sodium canned vegetables.   Fruits All fresh, canned (in natural juice), or  frozen fruits.  Protein Products  All fish and seafood.  Dried beans, peas, or lentils. Unsalted nuts and seeds. Unsalted canned beans.  Dairy Low-fat dairy products, such as skim or 1% milk, 2% or reduced-fat cheeses, low-fat ricotta or cottage cheese, or plain low-fat yogurt. Low-sodium or reduced-sodium cheeses.  Fats and Oils Tub margarines without trans fats. Light or reduced-fat mayonnaise and salad dressings (reduced sodium). Avocado. Safflower, olive, or canola oils. Natural peanut or almond butter.  Other Unsalted popcorn and pretzels. The items listed above may not be a complete list of recommended foods or beverages. Contact your dietitian for more options.  +++++++++++++++  WHAT FOODS ARE NOT RECOMMENDED? Grains/ White flour or wheat flour White bread. White pasta. White rice. Refined cornbread. Bagels and croissants. Crackers that contain trans fat.  Vegetables  Creamed or fried vegetables. Vegetables in a . Regular canned vegetables. Regular canned tomato sauce and paste. Regular tomato and vegetable juices.  Fruits Dried fruits. Canned fruit in light or heavy syrup. Fruit juice.  Meat and Other Protein Products Meat in general - RED meat & White meat.  Fatty cuts of  meat. Ribs, chicken wings, all processed meats as bacon, sausage, bologna, salami, fatback, hot dogs, bratwurst and packaged luncheon meats.  Dairy Whole or 2% milk, cream, half-and-half, and cream cheese. Whole-fat or sweetened yogurt. Full-fat cheeses or blue cheese. Non-dairy creamers and whipped toppings. Processed cheese, cheese spreads, or cheese curds.  Condiments Onion and garlic salt, seasoned salt, table salt, and sea salt. Canned and packaged gravies. Worcestershire sauce. Tartar sauce. Barbecue sauce. Teriyaki sauce. Soy sauce, including reduced sodium. Steak sauce. Fish sauce. Oyster sauce. Cocktail sauce. Horseradish. Ketchup and mustard. Meat flavorings and tenderizers. Bouillon cubes. Hot sauce. Tabasco sauce. Marinades. Taco seasonings. Relishes.  Fats and Oils Butter, stick margarine, lard, shortening and bacon fat. Coconut, palm kernel, or palm oils. Regular salad dressings.  Pickles and olives. Salted popcorn and pretzels.  The items listed above may not be a complete list of foods and beverages to avoid.

## 2018-09-04 LAB — LIPID PANEL
Cholesterol: 170 mg/dL (ref <200)
HDL: 63 mg/dL (ref >=50)
LDL Cholesterol (Calc): 85 mg/dL (calc)
Non-HDL Cholesterol (Calc): 107 mg/dL (calc) (ref <130)
Total CHOL/HDL Ratio: 2.7 (calc) (ref <5.0)
Triglycerides: 128 mg/dL (ref <150)

## 2018-09-04 LAB — CBC WITH DIFFERENTIAL/PLATELET
Absolute Monocytes: 494 cells/uL (ref 200–950)
Basophils Absolute: 92 cells/uL (ref 0–200)
Basophils Relative: 1.5 %
Eosinophils Absolute: 140 cells/uL (ref 15–500)
Eosinophils Relative: 2.3 %
HCT: 38.4 % (ref 35.0–45.0)
Hemoglobin: 12.8 g/dL (ref 11.7–15.5)
Lymphs Abs: 1787 cells/uL (ref 850–3900)
MCH: 30.5 pg (ref 27.0–33.0)
MCHC: 33.3 g/dL (ref 32.0–36.0)
MCV: 91.4 fL (ref 80.0–100.0)
MPV: 10.4 fL (ref 7.5–12.5)
Monocytes Relative: 8.1 %
Neutro Abs: 3587 cells/uL (ref 1500–7800)
Neutrophils Relative %: 58.8 %
Platelets: 300 10*3/uL (ref 140–400)
RBC: 4.2 10*6/uL (ref 3.80–5.10)
RDW: 12 % (ref 11.0–15.0)
Total Lymphocyte: 29.3 %
WBC: 6.1 10*3/uL (ref 3.8–10.8)

## 2018-09-04 LAB — COMPLETE METABOLIC PANEL WITH GFR
AG Ratio: 1.8 (calc) (ref 1.0–2.5)
ALT: 22 U/L (ref 6–29)
AST: 30 U/L (ref 10–35)
Albumin: 4.4 g/dL (ref 3.6–5.1)
Alkaline phosphatase (APISO): 79 U/L (ref 37–153)
BUN: 14 mg/dL (ref 7–25)
CO2: 25 mmol/L (ref 20–32)
Calcium: 9.8 mg/dL (ref 8.6–10.4)
Chloride: 105 mmol/L (ref 98–110)
Creat: 0.82 mg/dL (ref 0.60–0.93)
GFR, Est African American: 83 mL/min/{1.73_m2} (ref >=60)
GFR, Est Non African American: 71 mL/min/{1.73_m2} (ref >=60)
Globulin: 2.5 g/dL (calc) (ref 1.9–3.7)
Glucose, Bld: 81 mg/dL (ref 65–99)
Potassium: 4.6 mmol/L (ref 3.5–5.3)
Sodium: 138 mmol/L (ref 135–146)
Total Bilirubin: 0.5 mg/dL (ref 0.2–1.2)
Total Protein: 6.9 g/dL (ref 6.1–8.1)

## 2018-09-04 LAB — HEMOGLOBIN A1C
Hgb A1c MFr Bld: 5.6 % of total Hgb (ref <5.7)
Mean Plasma Glucose: 114 (calc)
eAG (mmol/L): 6.3 (calc)

## 2018-09-04 LAB — INSULIN, RANDOM: Insulin: 4.1 u[IU]/mL

## 2018-09-04 LAB — TSH: TSH: 2.28 mIU/L (ref 0.40–4.50)

## 2018-09-04 LAB — VITAMIN D 25 HYDROXY (VIT D DEFICIENCY, FRACTURES): Vit D, 25-Hydroxy: 86 ng/mL (ref 30–100)

## 2018-09-04 LAB — MAGNESIUM: Magnesium: 2.3 mg/dL (ref 1.5–2.5)

## 2018-09-05 IMAGING — CR DG LUMBAR SPINE COMPLETE 4+V
5 series · 5 of 5 positions shown · non-contrast
Comparison: CT abdomen and pelvis 11/23/2016

CLINICAL DATA: Low back pain bilaterally after falling at home 2
days ago

EXAM:
LUMBAR SPINE - COMPLETE 4+ VIEW

[l-spine ap]
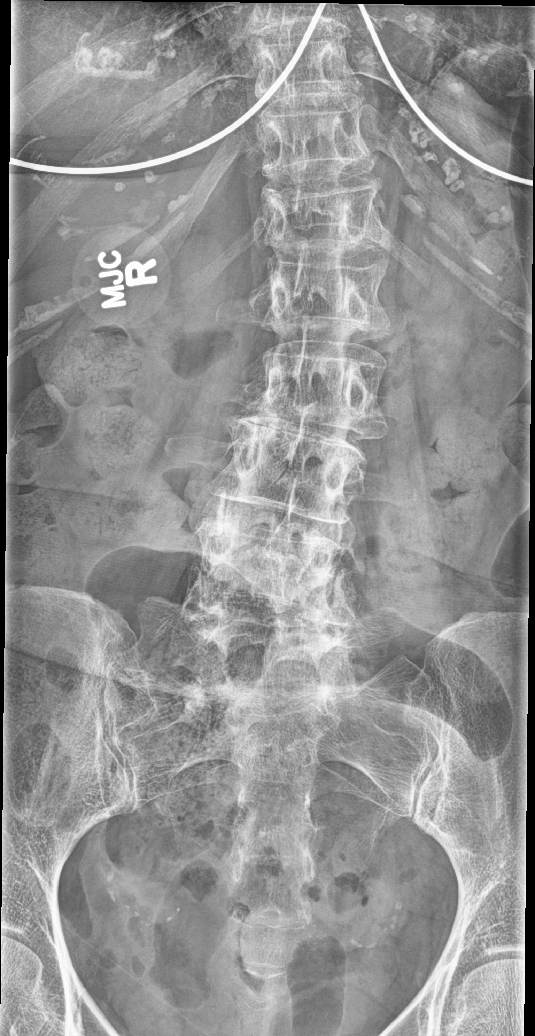

[l-spine obl (1 of 2)]
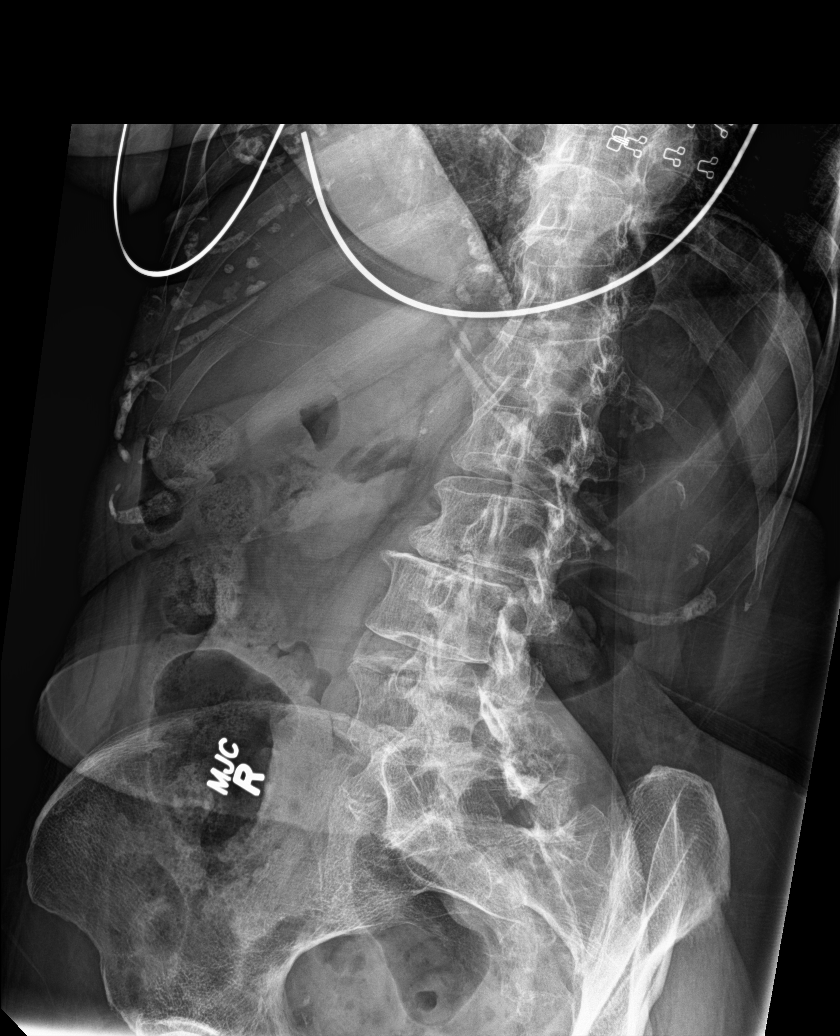

[l-spine obl (2 of 2)]
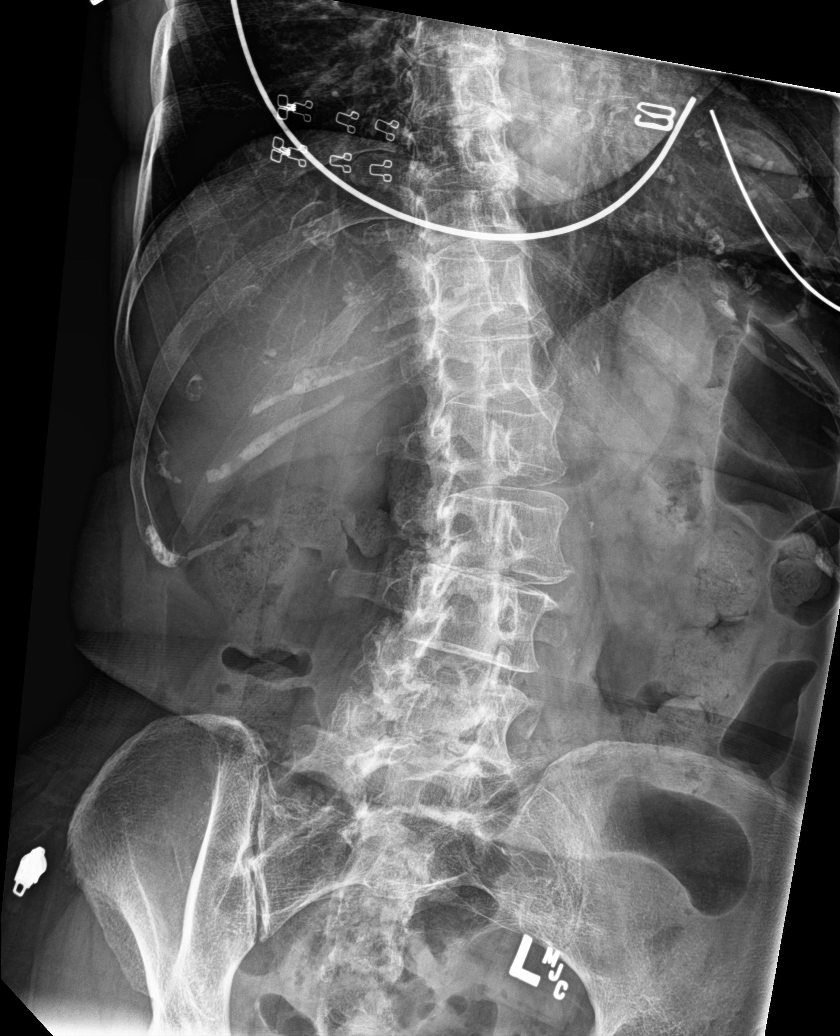

[l-spine lat]
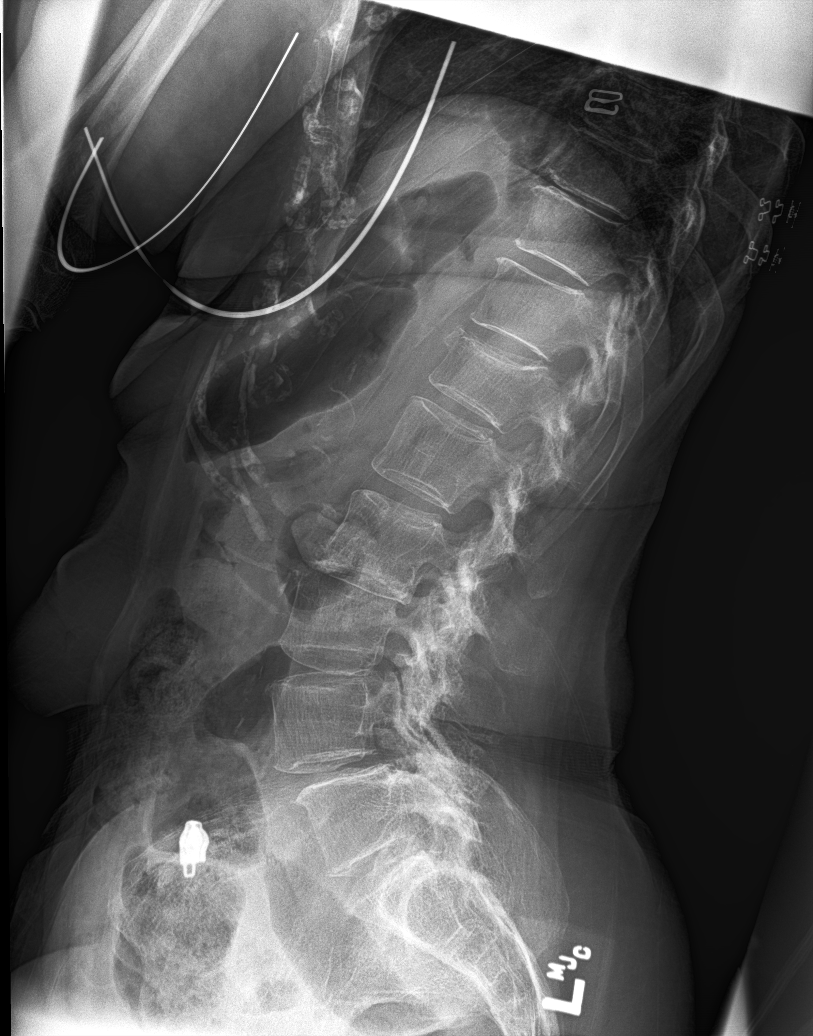

[l-spine spot]
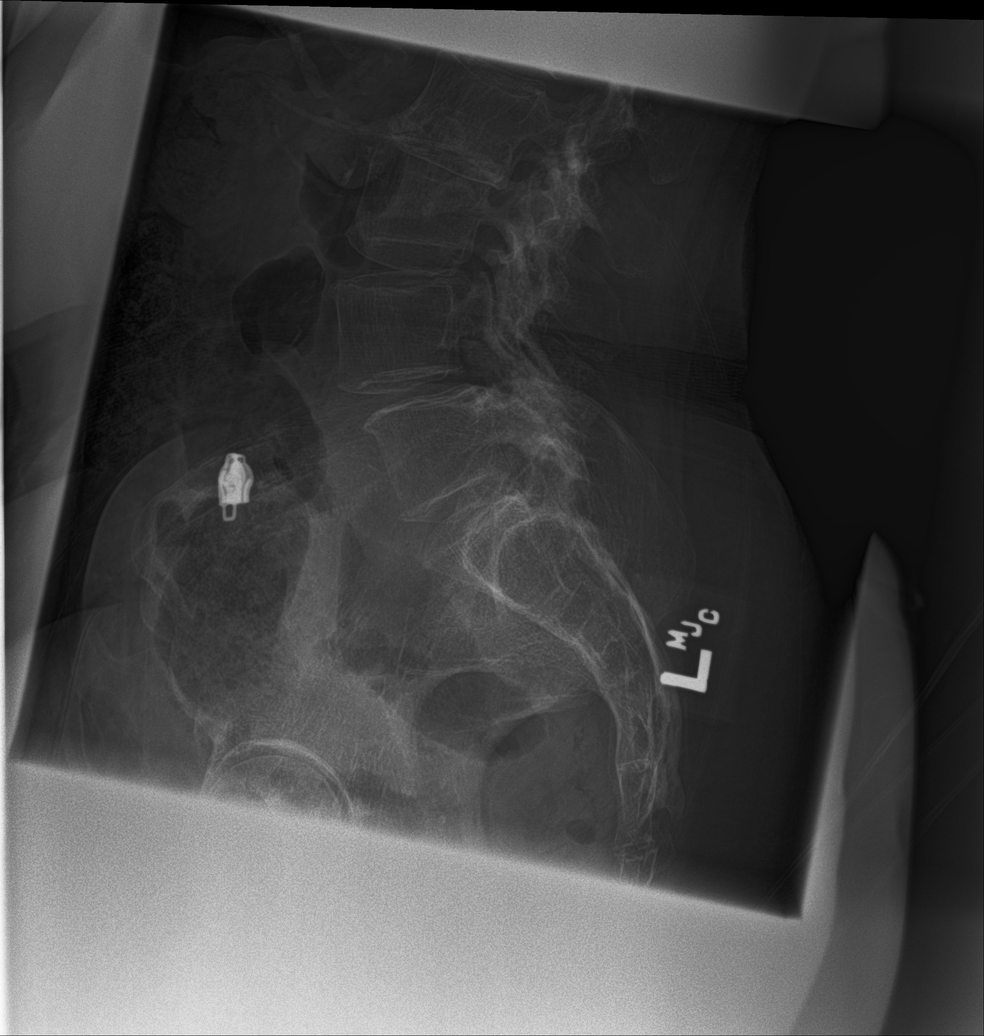

[5 of 5 positions shown; findings below may reference images not displayed]

FINDINGS: Diffuse osseous demineralization.

Five non-rib-bearing lumbar vertebra with partial sacralization of
the RIGHT transverse process of L5 and levoconvex thoracolumbar
scoliosis.

Multilevel disc space narrowing and minimal endplate spur formation.

Superior endplate compression fracture of T12 vertebral body with
10-20% anterior height loss new since prior CT.

Minimal retrolisthesis at L2-L3 and anterolisthesis at L4-L5 noted.

No additional fracture or bone destruction.

Facet degenerative changes lower lumbar spine.
IMPRESSION: Mild acute-appearing superior endplate compression fracture of T12
vertebral body with 10-20% anterior height loss.

Osseous demineralization with degenerative disc and facet disease
changes of lumbar spine and levoconvex scoliosis.

No additional acute abnormalities.

## 2018-09-11 ENCOUNTER — Other Ambulatory Visit: Payer: Self-pay | Admitting: Internal Medicine

## 2018-09-11 MED ORDER — ALPRAZOLAM 0.5 MG PO TABS: ORAL_TABLET | ORAL | 0 refills | Status: DC

## 2018-09-23 NOTE — Progress Notes (Signed)
Virtual Visit via Telephone Note  I connected with Deborah [name] on 09/23/18 at 10:50 AM EDT by telephone and verified that I am speaking with the correct person using two identifiers.   I discussed the limitations, risks, security and privacy concerns of performing an evaluation and management service by telephone and the availability of in person appointments. I also discussed with the patient that there may be a patient responsible charge related to this service. The patient expressed understanding and agreed to proceed.   Assessment and Plan:  Deborah [name] was seen today for follow-up.  Diagnoses and all orders for this visit:  Essential hypertension Doing well with this No medications at this time Monitor blood pressure at home; call if consistently over 130/80 Continue DASH diet.   Reminder to go to the ER if any CP, SOB, nausea, dizziness, severe HA, changes vision/speech, left arm numbness and tingling and jaw pain.  Generalized anxiety disorder No change from dose increase Continue Lexapro 5mg  daily Continue Alprazolam 0.5mg , half tablet a daily. Discussed less than 5 days a week, she has struggled with this. -Discussed stress management techniques discussed, increase water, good sleep hygiene discussed, increase exercise, and increase veggies.   Hyperlipidemia, mixed Continue Zetia 10mg  and Crestor 20mg  Discussed dietary and exercise modificatons   Migraine without aura and without status migrainosus, not intractable Doing well on current regiment Continue sumatriptan PRN, has not used this year Taking Topamax 100mg  half tablet BID  Contact office should symptoms persist or increase.  Consider changing Lexapro?  Discussed med's effects and SE's.   Over 30 minutes of non face to face interview, counseling, chart review, and critical decision making was performed.   Future Appointments  Date Time Provider [place]  09/24/2018 10:00 AM Garnet Sierras, NP [facility]  None  12/10/2018 10:30 AM Unk Pinto, MD [facility] None  06/24/2019 10:00 AM Unk Pinto, MD [facility] None  09/26/2019 11:00 AM Garnet Sierras, NP [facility] None    ------------------------------------------------------------------------------------------------------------------   HPI 73 y.o.female presents for evaluation from medication increase.  Last appointment was 09/13/18 via telephone for AWV.  At this time she had some increasing anxiety. Escitalopram was increased to 10mg  daily, she also has alprazolam 0.5mg  prn but using once a day.  Today she reports that she did not notice a difference or improvement in her anxiety.  She denies any side effects or adverse effects.  Reports she has been doing well and no increase in her anxiety symptoms.  She has been using diversion activities and avoiding known triggers that increase anxiety.     Past Medical History:  Diagnosis Date  . Allergy   . Anemia   . Anxiety   . Aortic atherosclerosis ([place]) 11/2016   Noted on CT abd  . Arthritis    hands and knees  . Bilateral cataracts    History of  . Cellulitis    Right finger  . Cerebral amyloid angiopathy (CODE)   . Cervical spondylosis 12/2008   Noted on Cervical spine  . Chronic nausea   . Compression fracture of T12 vertebra ([facility]) 06/07/2017   Mild  . DDD (degenerative disc disease), cervical 12/2008   Noted on Cervical spine  . Depression   . Encephalopathy 01/02/2017   Mild, [facility]  . GERD (gastroesophageal reflux disease)   . Helicobacter pylori infection 2018  . History of appendicitis    age 12  . History of colon polyps   . History of confusion   . History of hiatal  hernia 10/16/2016   Noted on Endoscopy  . Hyperlipidemia   . Internal hemorrhoid 08/02/2017   Noted on Colonoscopy  . Low blood pressure reading   . Migraines   . Osteopenia   . PONV (postoperative nausea and vomiting)   . Pre-diabetes   . RLS (restless legs syndrome)   . Seizure  ([place])    atypical partial comples vs. confusional migraines  . Skin cancer    right lower eyelid  . Squamous cell skin cancer    history of neck and upper back  . Syncope 03/25/2018  . TIA (transient ischemic attack)    questionable per patient  . Vitamin D deficiency      Allergies  Allergen Reactions  . Banana Nausea And Vomiting  . Chocolate Other (See Comments)    Unknown  . Effexor [Venlafaxine] Other (See Comments)    insomnia  . Maxalt [Rizatriptan Benzoate] Nausea Only and Other (See Comments)    Nausea and excessive salivation  . Other Other (See Comments)    NUTS (all kinds)  . Tussin [Guaifenesin] Rash  . Zostavax [Zoster Vaccine Live] Itching and Rash    Current Outpatient Medications on File Prior to Visit  Medication Sig  . ALPRAZolam (XANAX) 0.5 MG tablet Take 1/2-1 tablet 2 - 3 x /day ONLY if needed for Anxiety Attack &  limit to 5 days /week to avoid addiction  . aspirin EC 325 MG tablet Take 1 tablet (325 mg total) by mouth 2 (two) times daily after a meal. Take x 1 month post op to decrease risk of blood clots. (Patient taking differently: Take 325 mg by mouth as needed for mild pain. )  . Cholecalciferol (VITAMIN D3) 125 MCG (5000 UT) CAPS Take 5,000 Units by mouth daily.  Marland Kitchen escitalopram (LEXAPRO) 5 MG tablet Take 1 tablet daily for prevention of Chronic Anxiety & Panic Attacks  . ezetimibe (ZETIA) 10 MG tablet TAKE 1 TABLET BY MOUTH ONCE DAILY FOR CHOLESTEROL  . fluticasone (FLONASE) 50 MCG/ACT nasal spray Place 2 sprays into both nostrils daily as needed for allergies.  . Multiple Vitamins-Minerals (MULTIVITAMIN PO) Take 1 tablet by mouth daily.  . rosuvastatin (CRESTOR) 20 MG tablet TAKE 1 TABLET BY MOUTH ONCE DAILY  . SUMAtriptan (IMITREX) 20 MG/ACT nasal spray USE ONE DOSE IN THE NOSE NOW, MAY REPEAT IN 2 HOURS. MAX OF 2 DOSES IN 24 HOURS. (Patient taking differently: Place 20 mg into the nose every 2 (two) hours as needed for migraine. )  . topiramate  (TOPAMAX) 100 MG tablet Take 1/2 tablet 2 x /day at Supper & Bedtime   No current facility-administered medications on file prior to visit.     ROS: all negative except above.   Physical Exam:  There were no vitals taken for this visit.  General : Well sounding patient in no apparent distress HEENT: no hoarseness, no cough for duration of visit Lungs: speaks in complete sentences, no audible wheezing, no apparent distress Neurological: alert, oriented x 3 Psychiatric: pleasant, judgement appropriate    Garnet Sierras, NP 11:15AM [facility]

## 2018-09-24 ENCOUNTER — Other Ambulatory Visit: Payer: Self-pay

## 2018-09-24 ENCOUNTER — Ambulatory Visit: Payer: Medicare Other | Admitting: Adult Health Nurse Practitioner

## 2018-09-24 ENCOUNTER — Encounter: Payer: Self-pay | Admitting: Adult Health Nurse Practitioner

## 2018-09-24 VITALS — Ht 66.5 in

## 2018-09-24 DIAGNOSIS — G43009 Migraine without aura, not intractable, without status migrainosus: Secondary | ICD-10-CM | POA: Diagnosis not present

## 2018-09-24 DIAGNOSIS — I10 Essential (primary) hypertension: Secondary | ICD-10-CM | POA: Diagnosis not present

## 2018-09-24 DIAGNOSIS — F411 Generalized anxiety disorder: Secondary | ICD-10-CM

## 2018-09-24 DIAGNOSIS — E782 Mixed hyperlipidemia: Secondary | ICD-10-CM

## 2018-10-01 ENCOUNTER — Other Ambulatory Visit: Payer: Self-pay | Admitting: Internal Medicine

## 2018-10-01 DIAGNOSIS — E782 Mixed hyperlipidemia: Secondary | ICD-10-CM

## 2018-10-02 ENCOUNTER — Other Ambulatory Visit: Payer: Self-pay | Admitting: Internal Medicine

## 2018-10-02 DIAGNOSIS — E782 Mixed hyperlipidemia: Secondary | ICD-10-CM

## 2018-10-02 MED ORDER — ROSUVASTATIN CALCIUM 20 MG PO TABS: ORAL_TABLET | ORAL | 3 refills | Status: DC

## 2018-10-15 ENCOUNTER — Other Ambulatory Visit: Payer: Self-pay | Admitting: Internal Medicine

## 2018-10-15 DIAGNOSIS — E782 Mixed hyperlipidemia: Secondary | ICD-10-CM

## 2018-11-22 ENCOUNTER — Other Ambulatory Visit: Payer: Self-pay | Admitting: Internal Medicine

## 2018-11-22 MED ORDER — ALPRAZOLAM 0.5 MG PO TABS: ORAL_TABLET | ORAL | 0 refills | Status: DC

## 2018-12-09 ENCOUNTER — Encounter: Payer: Self-pay | Admitting: Internal Medicine

## 2018-12-09 NOTE — Patient Instructions (Signed)

## 2018-12-09 NOTE — Progress Notes (Signed)
History of Present Illness:      This very nice 73 y.o. MWF presents for 6 month follow up with HTN, HLD, Pre-Diabetes and Vitamin D Deficiency. Patient is also followed by Neurology for Migraines.       Patient is followed expectantly for labile HTN  (2010) & BP has been controlled at home. Today's BP is at goal - 110/66. Patient has had no complaints of any cardiac type chest pain, palpitations, dyspnea / orthopnea / PND, dizziness, claudication, or dependent edema.  Hyperlipidemia is controlled with diet & Crestor Chauncey Cruel. Patient denies myalgias or other med SE's. Last Lipids were at goal: Lab Results  Component Value Date   CHOL 170 09/03/2018   HDL 63 09/03/2018   LDLCALC 85 09/03/2018   TRIG 128 09/03/2018   CHOLHDL 2.7 09/03/2018       Also, the patient has history of PreDiabetes (A1c 6.1% / 2011)  and has had no symptoms of reactive hypoglycemia, diabetic polys, paresthesias or visual blurring.  Last A1c was Normal & at goal: Lab Results  Component Value Date   HGBA1C 5.6 09/03/2018       Further, the patient also has history of Vitamin D Deficiency ("18" / 2008) and supplements vitamin D without any suspected side-effects. Last vitamin D was at goal: Lab Results  Component Value Date   VD25OH 86 09/03/2018   Current Outpatient Medications on File Prior to Visit  Medication Sig  . ALPRAZolam (XANAX) 0.5 MG tablet Take 1/2-1 tablet 2 - 3 x /day ONLY if needed for Anxiety Attack &  limit to 5 days /week to avoid addiction  . aspirin EC 325 MG tablet Take 1 tablet (325 mg total) by mouth 2 (two) times daily after a meal. Take x 1 month post op to decrease risk of blood clots. (Patient taking differently: Take 325 mg by mouth as needed for mild pain. )  . aspirin EC 81 MG tablet Take 81 mg by mouth daily.  . Cholecalciferol (VITAMIN D3) 125 MCG (5000 UT) CAPS Take 5,000 Units by mouth daily.  Marland Kitchen escitalopram (LEXAPRO) 5 MG tablet TAKE 1 TABLET BY MOUTH DAILY FOR PREVENTION  OF CHRONIC ANXIETY AND PANIC ATTACKS  . ezetimibe (ZETIA) 10 MG tablet Take 1 tablet Daily for Cholesterol  . fluticasone (FLONASE) 50 MCG/ACT nasal spray Place 2 sprays into both nostrils daily as needed for allergies.  . Multiple Vitamins-Minerals (MULTIVITAMIN PO) Take 1 tablet by mouth daily.  . rosuvastatin (CRESTOR) 20 MG tablet Take 1 tablet Daily for Cholesterol  . SUMAtriptan (IMITREX) 20 MG/ACT nasal spray USE ONE DOSE IN THE NOSE NOW, MAY REPEAT IN 2 HOURS. MAX OF 2 DOSES IN 24 HOURS. (Patient taking differently: Place 20 mg into the nose every 2 (two) hours as needed for migraine. )  . topiramate (TOPAMAX) 100 MG tablet Take 1/2 tablet 2 x /day at Supper & Bedtime   No current facility-administered medications on file prior to visit.    Allergies  Allergen Reactions  . Banana Nausea And Vomiting  . Chocolate Other (See Comments)    Unknown  . Effexor [Venlafaxine] Other (See Comments)    insomnia  . Maxalt [Rizatriptan Benzoate] Nausea Only and Other (See Comments)    Nausea and excessive salivation  . Other Other (See Comments)    NUTS (all kinds)  . Tussin [Guaifenesin] Rash  . Zostavax [Zoster Vaccine Live] Itching and Rash   MHx:   Past Medical History:  Diagnosis Date  . Allergy   . Anemia   . Anxiety   . Aortic atherosclerosis ([place]) 11/2016   Noted on CT abd  . Arthritis    hands and knees  . Bilateral cataracts    History of  . Cellulitis    Right finger  . Cerebral amyloid angiopathy ([facility])   . Cervical spondylosis 12/2008   Noted on Cervical spine  . Chronic nausea   . Compression fracture of T12 vertebra ([facility]) 06/07/2017   Mild  . DDD (degenerative disc disease), cervical 12/2008   Noted on Cervical spine  . Depression   . Encephalopathy 01/02/2017   Mild, [facility]  . GERD (gastroesophageal reflux disease)   . Helicobacter pylori infection 2018  . History of appendicitis    age 21  . History of colon polyps   . History of confusion   .  History of hiatal hernia 10/16/2016   Noted on Endoscopy  . Hyperlipidemia   . Internal hemorrhoid 08/02/2017   Noted on Colonoscopy  . Low blood pressure reading   . Migraines   . Osteopenia   . PONV (postoperative nausea and vomiting)   . Pre-diabetes   . RLS (restless legs syndrome)   . Seizure ([place])    atypical partial comples vs. confusional migraines  . Skin cancer    right lower eyelid  . Squamous cell skin cancer    history of neck and upper back  . Syncope 03/25/2018  . TIA (transient ischemic attack)    questionable per patient  . Vitamin D deficiency    Immunization History  Administered Date(s) Administered  . Influenza, High Dose Seasonal PF 01/01/2014, 03/15/2015, 12/13/2015, 01/03/2017, 02/05/2018  . Influenza-Unspecified 12/26/2012  . Pneumococcal Conjugate-13 08/06/2014  . Pneumococcal-Unspecified 12/26/2012  . Td 12/26/2012  . Tdap 05/08/2017  . Zoster 12/08/2010   Past Surgical History:  Procedure Laterality Date  . APPENDECTOMY  1959  . CATARACT EXTRACTION, BILATERAL Bilateral 2014  . [place]  . COLONOSCOPY W/ POLYPECTOMY  08/02/2017  . KNEE ARTHROSCOPY Bilateral 04/2014   [facility]  . MOHS SURGERY Right 04/2013   Squamous cell cacinoma, neck/upper back  . SPINE SURGERY  2010   C6-C7 fusion  . TOTAL KNEE ARTHROPLASTY Left 03/22/2018   Procedure: LEFT TOTAL KNEE ARTHROPLASTY;  Surgeon: Dorna Leitz, MD;  Location: [facility];  Service: Orthopedics;  Laterality: Left;  Adductor Block  . UPPER GI ENDOSCOPY  10/16/2016   FHx:    Reviewed / unchanged  SHx:    Reviewed / unchanged   Systems Review:  Constitutional: Denies fever, chills, wt changes, headaches, insomnia, fatigue, night sweats, change in appetite. Eyes: Denies redness, blurred vision, diplopia, discharge, itchy, watery eyes.  ENT: Denies discharge, congestion, post nasal drip, epistaxis, sore throat, earache, hearing loss, dental pain, tinnitus, vertigo, sinus pain,  snoring.  CV: Denies chest pain, palpitations, irregular heartbeat, syncope, dyspnea, diaphoresis, orthopnea, PND, claudication or edema. Respiratory: denies cough, dyspnea, DOE, pleurisy, hoarseness, laryngitis, wheezing.  Gastrointestinal: Denies dysphagia, odynophagia, heartburn, reflux, water brash, abdominal pain or cramps, nausea, vomiting, bloating, diarrhea, constipation, hematemesis, melena, hematochezia  or hemorrhoids. Genitourinary: Denies dysuria, frequency, urgency, nocturia, hesitancy, discharge, hematuria or flank pain. Musculoskeletal: Denies arthralgias, myalgias, stiffness, jt. swelling, pain, limping or strain/sprain.  Skin: Denies pruritus, rash, hives, warts, acne, eczema or change in skin lesion(s). Neuro: No weakness, tremor, incoordination, spasms, paresthesia or pain. Psychiatric: Denies confusion, memory loss or sensory loss. Endo: Denies change in weight, skin or  hair change.  Heme/Lymph: No excessive bleeding, bruising or enlarged lymph nodes.  Physical Exam  BP 110/66   Pulse 80   Temp (!) 97.3 F (36.3 C)   Resp 16   Ht 5' 6.5" (1.689 m)   Wt 140 lb 9.6 oz (63.8 kg)   BMI 22.35 kg/m   Appears  well nourished, well groomed  and in no distress.  Eyes: PERRLA, EOMs, conjunctiva no swelling or erythema. Sinuses: No frontal/maxillary tenderness ENT/Mouth: EAC's clear, TM's nl w/o erythema, bulging. Nares clear w/o erythema, swelling, exudates. Oropharynx clear without erythema or exudates. Oral hygiene is good. Tongue normal, non obstructing. Hearing intact.  Neck: Supple. Thyroid not palpable. Car 2+/2+ without bruits, nodes or JVD. Chest: Respirations nl with BS clear & equal w/o rales, rhonchi, wheezing or stridor.  Cor: Heart sounds normal w/ regular rate and rhythm without sig. murmurs, gallops, clicks or rubs. Peripheral pulses normal and equal  without edema.  Abdomen: Soft & bowel sounds normal. Non-tender w/o guarding, rebound, hernias, masses or  organomegaly.  Lymphatics: Unremarkable.  Musculoskeletal: Full ROM all peripheral extremities, joint stability, 5/5 strength and normal gait.  Skin: Warm, dry without exposed rashes, lesions or ecchymosis apparent.  Neuro: Cranial nerves intact, reflexes equal bilaterally. Sensory-motor testing grossly intact. Tendon reflexes grossly intact.  Pysch: Alert & oriented x 3.  Insight and judgement nl & appropriate. No ideations.  Assessment and Plan:  1. Essential hypertension  - Continue medication, monitor blood pressure at home.  - Continue DASH diet.  Reminder to go to the ER if any CP,  SOB, nausea, dizziness, severe HA, changes vision/speech.  - CBC with Differential/Platelet - COMPLETE METABOLIC PANEL WITH GFR - Magnesium - TSH  2. Hyperlipidemia, mixed  - Continue diet/meds, exercise,& lifestyle modifications.  - Continue monitor periodic cholesterol/liver & renal functions   - Lipid panel - TSH  3. Abnormal glucose  - Continue diet, exercise  - Lifestyle modifications.  - Monitor appropriate labs.  - Hemoglobin A1c - Insulin, random  4. Vitamin D deficiency  - Continue supplementation.  - VITAMIN D 25 Hydroxyl  5. Migraine without aura and without status migrainosus, not intractable   6. Medication management  - CBC with Differential/Platelet - COMPLETE METABOLIC PANEL WITH GFR - Magnesium - Lipid panel - TSH - Hemoglobin A1c - Insulin, random - VITAMIN D 25 Hydroxyl       Discussed  regular exercise, BP monitoring, weight control to achieve/maintain BMI less than 25 and discussed med and SE's. Recommended labs to assess and monitor clinical status with further disposition pending results of labs.  I discussed the assessment and treatment plan with the patient. The patient was provided an opportunity to ask questions and all were answered. The patient agreed with the plan and demonstrated an understanding of the instructions.  I provided over 30 minutes  of exam, counseling, chart review and  complex critical decision making.  Kirtland Bouchard, MD

## 2018-12-10 ENCOUNTER — Other Ambulatory Visit: Payer: Self-pay

## 2018-12-10 ENCOUNTER — Ambulatory Visit: Payer: Medicare Other | Admitting: Internal Medicine

## 2018-12-10 VITALS — BP 110/66 | HR 80 | Temp 97.3°F | Resp 16 | Ht 66.5 in | Wt 140.6 lb

## 2018-12-10 DIAGNOSIS — R7309 Other abnormal glucose: Secondary | ICD-10-CM

## 2018-12-10 DIAGNOSIS — E782 Mixed hyperlipidemia: Secondary | ICD-10-CM

## 2018-12-10 DIAGNOSIS — G43009 Migraine without aura, not intractable, without status migrainosus: Secondary | ICD-10-CM

## 2018-12-10 DIAGNOSIS — E559 Vitamin D deficiency, unspecified: Secondary | ICD-10-CM

## 2018-12-10 DIAGNOSIS — I10 Essential (primary) hypertension: Secondary | ICD-10-CM

## 2018-12-10 DIAGNOSIS — Z79899 Other long term (current) drug therapy: Secondary | ICD-10-CM

## 2018-12-11 LAB — CBC WITH DIFFERENTIAL/PLATELET
Absolute Monocytes: 559 cells/uL (ref 200–950)
Basophils Absolute: 80 cells/uL (ref 0–200)
Basophils Relative: 1.4 %
Eosinophils Absolute: 200 cells/uL (ref 15–500)
Eosinophils Relative: 3.5 %
HCT: 35.2 % (ref 35.0–45.0)
Hemoglobin: 11.7 g/dL (ref 11.7–15.5)
Lymphs Abs: 1568 cells/uL (ref 850–3900)
MCH: 30.4 pg (ref 27.0–33.0)
MCHC: 33.2 g/dL (ref 32.0–36.0)
MCV: 91.4 fL (ref 80.0–100.0)
MPV: 10.7 fL (ref 7.5–12.5)
Monocytes Relative: 9.8 %
Neutro Abs: 3295 cells/uL (ref 1500–7800)
Neutrophils Relative %: 57.8 %
Platelets: 264 10*3/uL (ref 140–400)
RBC: 3.85 10*6/uL (ref 3.80–5.10)
RDW: 11.9 % (ref 11.0–15.0)
Total Lymphocyte: 27.5 %
WBC: 5.7 10*3/uL (ref 3.8–10.8)

## 2018-12-11 LAB — COMPLETE METABOLIC PANEL WITH GFR
AG Ratio: 2 (calc) (ref 1.0–2.5)
ALT: 11 U/L (ref 6–29)
AST: 22 U/L (ref 10–35)
Albumin: 4.2 g/dL (ref 3.6–5.1)
Alkaline phosphatase (APISO): 72 U/L (ref 37–153)
BUN: 14 mg/dL (ref 7–25)
CO2: 23 mmol/L (ref 20–32)
Calcium: 9.5 mg/dL (ref 8.6–10.4)
Chloride: 107 mmol/L (ref 98–110)
Creat: 0.8 mg/dL (ref 0.60–0.93)
GFR, Est African American: 85 mL/min/{1.73_m2} (ref >=60)
GFR, Est Non African American: 74 mL/min/{1.73_m2} (ref >=60)
Globulin: 2.1 g/dL (calc) (ref 1.9–3.7)
Glucose, Bld: 96 mg/dL (ref 65–99)
Potassium: 4 mmol/L (ref 3.5–5.3)
Sodium: 137 mmol/L (ref 135–146)
Total Bilirubin: 0.5 mg/dL (ref 0.2–1.2)
Total Protein: 6.3 g/dL (ref 6.1–8.1)

## 2018-12-11 LAB — HEMOGLOBIN A1C
Hgb A1c MFr Bld: 5.7 % of total Hgb — ABNORMAL HIGH (ref <5.7)
Mean Plasma Glucose: 117 (calc)
eAG (mmol/L): 6.5 (calc)

## 2018-12-11 LAB — TSH: TSH: 2.79 mIU/L (ref 0.40–4.50)

## 2018-12-11 LAB — INSULIN, RANDOM: Insulin: 3.1 u[IU]/mL

## 2018-12-11 LAB — LIPID PANEL
Cholesterol: 146 mg/dL (ref <200)
HDL: 64 mg/dL (ref >=50)
LDL Cholesterol (Calc): 63 mg/dL (calc)
Non-HDL Cholesterol (Calc): 82 mg/dL (calc) (ref <130)
Total CHOL/HDL Ratio: 2.3 (calc) (ref <5.0)
Triglycerides: 102 mg/dL (ref <150)

## 2018-12-11 LAB — VITAMIN D 25 HYDROXY (VIT D DEFICIENCY, FRACTURES): Vit D, 25-Hydroxy: 96 ng/mL (ref 30–100)

## 2018-12-11 LAB — MAGNESIUM: Magnesium: 2.3 mg/dL (ref 1.5–2.5)

## 2019-02-07 ENCOUNTER — Other Ambulatory Visit: Payer: Self-pay | Admitting: Internal Medicine

## 2019-02-07 DIAGNOSIS — G43009 Migraine without aura, not intractable, without status migrainosus: Secondary | ICD-10-CM

## 2019-03-04 ENCOUNTER — Ambulatory Visit: Payer: Self-pay | Admitting: Adult Health

## 2019-03-07 ENCOUNTER — Other Ambulatory Visit: Payer: Self-pay | Admitting: Adult Health

## 2019-03-18 ENCOUNTER — Other Ambulatory Visit: Payer: Self-pay | Admitting: Internal Medicine

## 2019-03-18 NOTE — Progress Notes (Signed)
FOLLOW UP  Assessment and Plan:   Hypertension Controlled off of medications Monitor blood pressure at home; patient to call if consistently greater than 130/80 Continue DASH diet.   Reminder to go to the ER if any CP, SOB, nausea, dizziness, severe HA, changes vision/speech, left arm numbness and tingling and jaw pain.  Cholesterol Currently at goal; continue crestor and zetia Continue low cholesterol diet and exercise.  Check lipid panel.   Other abnormal glucose Recent A1Cs at goal Discussed diet/exercise, weight management  Defer A1C; check BMP  BMI 24 Continue to recommend diet heavy in fruits and veggies and low in animal meats, cheeses, and dairy products, appropriate calorie intake Discuss exercise recommendations routinely Continue to monitor weight at each visit  Vitamin D Def At goal at last visit; continue supplementation to maintain goal of 70-100 Defer Vit D level  Anxiety Will do trial off lexapro, however she will monitor for symptoms very closely and she will notify her husband to let her know of any changes he notices.  Stress management techniques discussed, increase water, good sleep hygiene discussed, increase exercise, and increase veggies.   Seizures No recent seizures Continue follow up with neurology   Continue diet and meds as discussed. Further disposition pending results of labs. Discussed med's effects and SE's.   Over 30 minutes of exam, counseling, chart review, and critical decision making was performed.   Future Appointments  Date Time Provider [place]  06/24/2019 10:00 AM Unk Pinto, MD [facility] None  09/26/2019 11:00 AM Garnet Sierras, NP [facility] None    ----------------------------------------------------------------------------------------------------------------------  HPI 73 y.o. female  presents for 3 month follow up on hypertension, cholesterol, glucose management, weight, anxiety and vitamin D  deficiency.   She has history of potential acute confusion versus atypical partial complex seizure vs acute confusional migraine in 2018, following  Dr Leta Baptist for ongoing neuro evaluation of HA's and cognitive dysfunction. Patient reports significant improvement in migraines with lifestyle changes.   she has a diagnosis of anxiety, she is off lexapro due to shortage, and did not have xanax x 1 week, she has been having some dizziness with this. Would like to try to stay off the lexapro.    BMI is Body mass index is 21.62 kg/m., she has been working on diet and exercise. Wt Readings from Last 3 Encounters:  03/19/19 136 lb (61.7 kg)  12/10/18 140 lb 9.6 oz (63.8 kg)  09/03/18 145 lb (65.8 kg)   Her blood pressure has been controlled at home, today their BP is BP: 120/72  She does not workout (due to recent neck and back issues), does try to walk a short distance daily. She denies chest pain, shortness of breath, dizziness.   She is on cholesterol medication (crestor 20 mg, zetia 10 mg) and denies myalgias. Her cholesterol is at goal. The cholesterol last visit was:   Lab Results  Component Value Date   CHOL 146 12/10/2018   HDL 64 12/10/2018   LDLCALC 63 12/10/2018   TRIG 102 12/10/2018   CHOLHDL 2.3 12/10/2018    She has been working on diet and exercise for glucose management, and denies foot ulcerations, increased appetite, nausea, paresthesia of the feet, polydipsia, polyuria, visual disturbances, vomiting and weight loss. Last A1C in the office was:  Lab Results  Component Value Date   HGBA1C 5.7 (H) 12/10/2018   Patient is on Vitamin D supplement.   Lab Results  Component Value Date   VD25OH 96 12/10/2018  Current Medications:  Current Outpatient Medications on File Prior to Visit  Medication Sig  . ALPRAZolam (XANAX) 0.5 MG tablet Take 1/2-1 tablet 2 - 3 x /day ONLY if needed for Anxiety Attack &  limit to 5 days /week to avoid addiction  . Ascorbic Acid  (VITAMIN C) 1000 MG tablet Take 1,000 mg by mouth daily.  Marland Kitchen aspirin EC 325 MG tablet Take 1 tablet (325 mg total) by mouth 2 (two) times daily after a meal. Take x 1 month post op to decrease risk of blood clots. (Patient taking differently: Take 325 mg by mouth as needed for mild pain. )  . aspirin EC 81 MG tablet Take 81 mg by mouth daily.  . Cholecalciferol (VITAMIN D3) 125 MCG (5000 UT) CAPS Take 5,000 Units by mouth daily.  Marland Kitchen escitalopram (LEXAPRO) 5 MG tablet TAKE 1 TABLET BY MOUTH ONCE DAILY FOR PREVENTION F CHRONIC ANXIETY AND PANIC ATTACKS  . ezetimibe (ZETIA) 10 MG tablet Take 1 tablet Daily for Cholesterol  . fluticasone (FLONASE) 50 MCG/ACT nasal spray Place 2 sprays into both nostrils daily as needed for allergies.  . Multiple Vitamins-Minerals (MULTIVITAMIN PO) Take 1 tablet by mouth daily.  . Multiple Vitamins-Minerals (ZINC PO) Take 500 mg by mouth.  . rosuvastatin (CRESTOR) 20 MG tablet Take 1 tablet Daily for Cholesterol  . SUMAtriptan (IMITREX) 20 MG/ACT nasal spray USE ONE DOSE IN THE NOSE NOW, MAY REPEAT IN 2 HOURS. MAX OF 2 DOSES IN 24 HOURS. (Patient taking differently: Place 20 mg into the nose every 2 (two) hours as needed for migraine. )  . topiramate (TOPAMAX) 100 MG tablet TAKE 1 TABLET BY MOUTH TWICE DAILY FOR HEADACHE PREVENTION   No current facility-administered medications on file prior to visit.      Allergies:  Allergies  Allergen Reactions  . Banana Nausea And Vomiting  . Chocolate Other (See Comments)    Unknown  . Effexor [Venlafaxine] Other (See Comments)    insomnia  . Maxalt [Rizatriptan Benzoate] Nausea Only and Other (See Comments)    Nausea and excessive salivation  . Other Other (See Comments)    NUTS (all kinds)  . Tussin [Guaifenesin] Rash  . Zostavax [Zoster Vaccine Live] Itching and Rash     Medical History:  Past Medical History:  Diagnosis Date  . Allergy   . Anemia   . Anxiety   . Aortic atherosclerosis ([place]) 11/2016   Noted  on CT abd  . Arthritis    hands and knees  . Bilateral cataracts    History of  . Cellulitis    Right finger  . Cerebral amyloid angiopathy ([facility])   . Cervical spondylosis 12/2008   Noted on Cervical spine  . Chronic nausea   . Compression fracture of T12 vertebra ([facility]) 06/07/2017   Mild  . DDD (degenerative disc disease), cervical 12/2008   Noted on Cervical spine  . Depression   . Encephalopathy 01/02/2017   Mild, [place] EEG  . GERD (gastroesophageal reflux disease)   . Helicobacter pylori infection 2018  . History of appendicitis    age 8  . History of colon polyps   . History of confusion   . History of hiatal hernia 10/16/2016   Noted on Endoscopy  . Hyperlipidemia   . Internal hemorrhoid 08/02/2017   Noted on Colonoscopy  . Low blood pressure reading   . Migraines   . Osteopenia   . PONV (postoperative nausea and vomiting)   . Pre-diabetes   .  RLS (restless legs syndrome)   . Seizure ([place])    atypical partial comples vs. confusional migraines  . Skin cancer    right lower eyelid  . Squamous cell skin cancer    history of neck and upper back  . Syncope 03/25/2018  . TIA (transient ischemic attack)    questionable per patient  . Vitamin D deficiency    Family history- Reviewed and unchanged Social history- Reviewed and unchanged   Review of Systems:  Review of Systems  Constitutional: Negative for malaise/fatigue and weight loss.  HENT: Negative for hearing loss and tinnitus.   Eyes: Negative for blurred vision and double vision.  Respiratory: Negative for cough, shortness of breath and wheezing.   Cardiovascular: Negative for chest pain, palpitations, orthopnea, claudication and leg swelling.  Gastrointestinal: Negative for abdominal pain, blood in stool, constipation, diarrhea, heartburn, melena, nausea and vomiting.  Genitourinary: Negative.   Musculoskeletal: Positive for back pain and neck pain. Negative for joint pain and myalgias.  Skin:  Negative for rash.  Neurological: Positive for tremors (Improved, intermittent, bilateral hands). Negative for dizziness, tingling, sensory change, weakness and headaches.  Endo/Heme/Allergies: Negative for polydipsia.  Psychiatric/Behavioral: Negative for depression. The patient is nervous/anxious. The patient does not have insomnia.   All other systems reviewed and are negative.   Physical Exam: BP 120/72   Pulse 72   Temp (!) 97.5 F (36.4 C)   Ht 5' 6.5" (1.689 m)   Wt 136 lb (61.7 kg)   SpO2 96%   BMI 21.62 kg/m  Wt Readings from Last 3 Encounters:  03/19/19 136 lb (61.7 kg)  12/10/18 140 lb 9.6 oz (63.8 kg)  09/03/18 145 lb (65.8 kg)   General Appearance: Well nourished, in no apparent distress. Eyes: PERRLA, EOMs, conjunctiva no swelling or erythema Sinuses: No Frontal/maxillary tenderness ENT/Mouth: Ext aud canals clear, TMs without erythema, bulging. No erythema, swelling, or exudate on post pharynx.  Tonsils not swollen or erythematous. Hearing normal.  Neck: Supple, thyroid normal.  Respiratory: Respiratory effort normal, BS equal bilaterally without rales, rhonchi, wheezing or stridor.  Cardio: RRR with no MRGs. Brisk peripheral pulses without edema.  Abdomen: Soft, + BS.  Non tender, no guarding, rebound, hernias, masses. Lymphatics: Non tender without lymphadenopathy.  Musculoskeletal: Full ROM, 5/5 strength, Normal gait Skin: Warm, dry without rashes, lesions, ecchymosis.  Neuro: Cranial nerves intact. No cerebellar symptoms.  Psych: Awake and oriented X 3, normal affect, Insight and Judgment appropriate.    Vicie Mutters, PA-C 11:04 AM [facility]

## 2019-03-19 ENCOUNTER — Ambulatory Visit: Payer: Medicare Other | Admitting: Physician Assistant

## 2019-03-19 ENCOUNTER — Encounter: Payer: Self-pay | Admitting: Physician Assistant

## 2019-03-19 ENCOUNTER — Other Ambulatory Visit: Payer: Self-pay

## 2019-03-19 VITALS — BP 120/72 | HR 72 | Temp 97.5°F | Ht 66.5 in | Wt 136.0 lb

## 2019-03-19 DIAGNOSIS — I700 Atherosclerosis of aorta: Secondary | ICD-10-CM

## 2019-03-19 DIAGNOSIS — I10 Essential (primary) hypertension: Secondary | ICD-10-CM | POA: Diagnosis not present

## 2019-03-19 DIAGNOSIS — Z79899 Other long term (current) drug therapy: Secondary | ICD-10-CM

## 2019-03-19 DIAGNOSIS — J3489 Other specified disorders of nose and nasal sinuses: Secondary | ICD-10-CM

## 2019-03-19 DIAGNOSIS — E785 Hyperlipidemia, unspecified: Secondary | ICD-10-CM

## 2019-03-19 DIAGNOSIS — G40909 Epilepsy, unspecified, not intractable, without status epilepticus: Secondary | ICD-10-CM

## 2019-03-19 DIAGNOSIS — E559 Vitamin D deficiency, unspecified: Secondary | ICD-10-CM

## 2019-03-19 DIAGNOSIS — S22080D Wedge compression fracture of T11-T12 vertebra, subsequent encounter for fracture with routine healing: Secondary | ICD-10-CM

## 2019-03-19 MED ORDER — AZELASTINE HCL 0.1 % NA SOLN: 2.0000 | Freq: Two times a day (BID) | NASAL | 2 refills | Status: DC

## 2019-03-19 NOTE — Patient Instructions (Addendum)

## 2019-03-20 LAB — CBC WITH DIFFERENTIAL/PLATELET
Absolute Monocytes: 795 cells/uL (ref 200–950)
Basophils Absolute: 90 cells/uL (ref 0–200)
Basophils Relative: 1.1 %
Eosinophils Absolute: 148 cells/uL (ref 15–500)
Eosinophils Relative: 1.8 %
HCT: 38.3 % (ref 35.0–45.0)
Hemoglobin: 12.8 g/dL (ref 11.7–15.5)
Lymphs Abs: 1697 cells/uL (ref 850–3900)
MCH: 30.8 pg (ref 27.0–33.0)
MCHC: 33.4 g/dL (ref 32.0–36.0)
MCV: 92.3 fL (ref 80.0–100.0)
MPV: 10.1 fL (ref 7.5–12.5)
Monocytes Relative: 9.7 %
Neutro Abs: 5469 cells/uL (ref 1500–7800)
Neutrophils Relative %: 66.7 %
Platelets: 305 10*3/uL (ref 140–400)
RBC: 4.15 10*6/uL (ref 3.80–5.10)
RDW: 12.2 % (ref 11.0–15.0)
Total Lymphocyte: 20.7 %
WBC: 8.2 10*3/uL (ref 3.8–10.8)

## 2019-03-20 LAB — COMPLETE METABOLIC PANEL WITH GFR
AG Ratio: 1.6 (calc) (ref 1.0–2.5)
ALT: 16 U/L (ref 6–29)
AST: 22 U/L (ref 10–35)
Albumin: 4.3 g/dL (ref 3.6–5.1)
Alkaline phosphatase (APISO): 72 U/L (ref 37–153)
BUN: 19 mg/dL (ref 7–25)
CO2: 24 mmol/L (ref 20–32)
Calcium: 9.9 mg/dL (ref 8.6–10.4)
Chloride: 104 mmol/L (ref 98–110)
Creat: 0.93 mg/dL (ref 0.60–0.93)
GFR, Est African American: 71 mL/min/{1.73_m2} (ref >=60)
GFR, Est Non African American: 61 mL/min/{1.73_m2} (ref >=60)
Globulin: 2.7 g/dL (calc) (ref 1.9–3.7)
Glucose, Bld: 93 mg/dL (ref 65–99)
Potassium: 4.2 mmol/L (ref 3.5–5.3)
Sodium: 137 mmol/L (ref 135–146)
Total Bilirubin: 0.4 mg/dL (ref 0.2–1.2)
Total Protein: 7 g/dL (ref 6.1–8.1)

## 2019-03-20 LAB — LIPID PANEL
Cholesterol: 155 mg/dL (ref <200)
HDL: 68 mg/dL (ref >=50)
LDL Cholesterol (Calc): 69 mg/dL (calc)
Non-HDL Cholesterol (Calc): 87 mg/dL (calc) (ref <130)
Total CHOL/HDL Ratio: 2.3 (calc) (ref <5.0)
Triglycerides: 95 mg/dL (ref <150)

## 2019-03-20 LAB — MAGNESIUM: Magnesium: 2.4 mg/dL (ref 1.5–2.5)

## 2019-03-20 LAB — VITAMIN D 25 HYDROXY (VIT D DEFICIENCY, FRACTURES): Vit D, 25-Hydroxy: 90 ng/mL (ref 30–100)

## 2019-03-20 LAB — TSH: TSH: 2.63 mIU/L (ref 0.40–4.50)

## 2019-03-22 ENCOUNTER — Other Ambulatory Visit: Payer: Self-pay | Admitting: Internal Medicine

## 2019-03-24 ENCOUNTER — Other Ambulatory Visit: Payer: Self-pay | Admitting: Orthopedic Surgery

## 2019-05-16 ENCOUNTER — Ambulatory Visit: Admit: 2019-05-16 | Payer: Medicare Other | Admitting: Orthopedic Surgery

## 2019-05-16 SURGERY — ARTHROPLASTY, KNEE, TOTAL
Anesthesia: Spinal | Site: Knee | Laterality: Right

## 2019-05-22 ENCOUNTER — Other Ambulatory Visit: Payer: Self-pay | Admitting: Internal Medicine

## 2019-05-22 DIAGNOSIS — Z1231 Encounter for screening mammogram for malignant neoplasm of breast: Secondary | ICD-10-CM

## 2019-06-12 ENCOUNTER — Ambulatory Visit: Payer: Medicare Other

## 2019-06-23 ENCOUNTER — Encounter: Payer: Self-pay | Admitting: Internal Medicine

## 2019-06-23 ENCOUNTER — Ambulatory Visit
Admission: RE | Admit: 2019-06-23 | Discharge: 2019-06-23 | Disposition: A | Discharge status: Home / Self Care | Payer: Medicare PPO | Source: Ambulatory Visit | Attending: Internal Medicine | Admitting: Internal Medicine

## 2019-06-23 ENCOUNTER — Other Ambulatory Visit: Payer: Self-pay

## 2019-06-23 DIAGNOSIS — Z1231 Encounter for screening mammogram for malignant neoplasm of breast: Secondary | ICD-10-CM | POA: Diagnosis not present

## 2019-06-23 NOTE — Patient Instructions (Addendum)
++++++++++++++++++++++++++++++++++++++++++++++++++++++++++ S T O P    B I O T I N     !  !  !   ++++++++++++++++++++++++++++++++++++++++++++++++++++  Vit D  & Vit C 1,000 mg   are recommended to help protect  against the Covid-19 and other Corona viruses.    Also it's recommended  to take  Zinc 50 mg  to help  protect against the Covid-19   and best place to get  is also on [email or web address]  and don't pay more than 6-8 cents /pill !  ================================ Coronavirus (COVID-19) Are you at risk?  Are you at risk for the Coronavirus (COVID-19)?  To be considered HIGH RISK for Coronavirus (COVID-19), you have to meet the following criteria:  . Traveled to [place], [place], [place], [place] or [place]; or in the [place] to [place], [place], [place]  . or [place]; and have fever, cough, and shortness of breath within the last 2 weeks of travel OR . Been in close contact with a person diagnosed with COVID-19 within the last 2 weeks and have  . fever, cough,and shortness of breath .  . IF YOU DO NOT MEET THESE CRITERIA, YOU ARE CONSIDERED LOW RISK FOR COVID-19.  What to do if you are HIGH RISK for COVID-19?  Marland Kitchen If you are having a medical emergency, call 911. . Seek medical care right away. Before you go to a doctor's office, urgent care or emergency department, .  call ahead and tell them about your recent travel, contact with someone diagnosed with COVID-19  .  and your symptoms.  . You should receive instructions from your physician's office regarding next steps of care.  . When you arrive at healthcare provider, tell the healthcare staff immediately you have returned from  . visiting [place], [place], [place], [place] or [place]; or traveled in the [place] to [place], [place],  . [place] or [place] in the last two weeks or you have been in close contact with a person diagnosed with  . COVID-19 in the last 2 weeks.   . Tell the health care staff about  your symptoms: fever, cough and shortness of breath. . After you have been seen by a medical provider, you will be either: o Tested for (COVID-19) and discharged home on quarantine except to seek medical care if  o symptoms worsen, and asked to  - Stay home and avoid contact with others until you get your results (4-5 days)  - Avoid travel on public transportation if possible (such as bus, train, or airplane) or o Sent to the Emergency Department by EMS for evaluation, COVID-19 testing  and  o possible admission depending on your condition and test results.  What to do if you are LOW RISK for COVID-19?  Reduce your risk of any infection by using the same precautions used for avoiding the common cold or flu:  Marland Kitchen Wash your hands often with soap and warm water for at least 20 seconds.  If soap and water are not readily available,  . use an alcohol-based hand sanitizer with at least 60% alcohol.  . If coughing or sneezing, cover your mouth and nose by coughing or sneezing into the elbow areas of your shirt or coat, .  into a tissue or into your sleeve (not your hands). . Avoid shaking hands with others and consider head nods or verbal greetings only. . Avoid touching your eyes, nose, or mouth with unwashed hands.  Marland Kitchen  Avoid close contact with people who are sick. . Avoid places or events with large numbers of people in one location, like concerts or sporting events. . Carefully consider travel plans you have or are making. . If you are planning any travel outside or inside the [place], visit the CDC's Travelers' Health webpage for the latest health notices. . If you have some symptoms but not all symptoms, continue to monitor at home and seek medical attention  . if your symptoms worsen. . If you are having a medical emergency, call 911.   . >>>>>>>>>>>>>>>>>>>>>>>>>>>>>>>>> . We Do NOT Approve of  [facility], [employer]  Our Patients  To Do Home Visits & We Do NOT Approve of  [employer] > > > > > > > > > > > > > > > > > > > > > > > > > > > > > > > > > > > > > > >  Preventive Care for Adults  A healthy lifestyle and preventive care can promote health and wellness. Preventive health guidelines for women include the following key practices.  A routine yearly physical is a good way to check with your health care provider about your health and preventive screening. It is a chance to share any concerns and updates on your health and to receive a thorough exam.  Visit your dentist for a routine exam and preventive care every 6 months. Brush your teeth twice a day and floss once a day. Good oral hygiene prevents tooth decay and gum disease.  The frequency of eye exams is based on your age, health, family medical history, use of contact lenses, and other factors. Follow your health care provider's recommendations for frequency of eye exams.  Eat a healthy diet. Foods like vegetables, fruits, whole grains, low-fat dairy products, and lean protein foods contain the nutrients you need without too many calories. Decrease your intake of foods high in solid fats, added sugars, and salt. Eat the right amount of calories for you. Get information about a proper diet from your health care provider, if necessary.  Regular physical exercise is one of the most important things you can do for your health. Most adults should get at least 150 minutes of moderate-intensity exercise (any activity that increases your heart rate and causes you to sweat) each week. In addition, most adults need muscle-strengthening exercises on 2 or more days a week.  Maintain a healthy weight. The body mass index (BMI) is a screening tool to identify possible weight problems. It provides an estimate of body fat based on height and weight. Your health care provider can find your BMI and can help you achieve or maintain a healthy weight. For adults 20 years and older:  A BMI below 18.5 is considered  underweight.  A BMI of 18.5 to 24.9 is normal.  A BMI of 25 to 29.9 is considered overweight.  A BMI of 30 and above is considered obese.  Maintain normal blood lipids and cholesterol levels by exercising and minimizing your intake of saturated fat. Eat a balanced diet with plenty of fruit and vegetables. If your lipid or cholesterol levels are high, you are over 50, or you are at high risk for heart disease, you may need your cholesterol levels checked more frequently. Ongoing high lipid and cholesterol levels should be treated with medicines if diet and exercise are not working.  If you smoke, find out from your health care provider how to quit. If you  do not use tobacco, do not start.  Lung cancer screening is recommended for adults aged 63-80 years who are at high risk for developing lung cancer because of a history of smoking. A yearly low-dose CT scan of the lungs is recommended for people who have at least a 30-pack-year history of smoking and are a current smoker or have quit within the past 15 years. A pack year of smoking is smoking an average of 1 pack of cigarettes a day for 1 year (for example: 1 pack a day for 30 years or 2 packs a day for 15 years). Yearly screening should continue until the smoker has stopped smoking for at least 15 years. Yearly screening should be stopped for people who develop a health problem that would prevent them from having lung cancer treatment.  Avoid use of street drugs. Do not share needles with anyone. Ask for help if you need support or instructions about stopping the use of drugs.  High blood pressure causes heart disease and increases the risk of stroke.  Ongoing high blood pressure should be treated with medicines if weight loss and exercise do not work.  If you are 86-28 years old, ask your health care provider if you should take aspirin to prevent strokes.  Diabetes screening involves taking a blood sample to check your fasting blood sugar  level. This should be done once every 3 years, after age 67, if you are within normal weight and without risk factors for diabetes. Testing should be considered at a younger age or be carried out more frequently if you are overweight and have at least 1 risk factor for diabetes.  Breast cancer screening is essential preventive care for women. You should practice "breast self-awareness." This means understanding the normal appearance and feel of your breasts and may include breast self-examination. Any changes detected, no matter how small, should be reported to a health care provider. Women in their 89s and 30s should have a clinical breast exam (CBE) by a health care provider as part of a regular health exam every 1 to 3 years. After age 46, women should have a CBE every year. Starting at age 12, women should consider having a mammogram (breast X-ray test) every year. Women who have a family history of breast cancer should talk to their health care provider about genetic screening. Women at a high risk of breast cancer should talk to their health care providers about having an MRI and a mammogram every year.  Breast cancer gene (BRCA)-related cancer risk assessment is recommended for women who have family members with BRCA-related cancers. BRCA-related cancers include breast, ovarian, tubal, and peritoneal cancers. Having family members with these cancers may be associated with an increased risk for harmful changes (mutations) in the breast cancer genes BRCA1 and BRCA2. Results of the assessment will determine the need for genetic counseling and BRCA1 and BRCA2 testing.  Routine pelvic exams to screen for cancer are no longer recommended for nonpregnant women who are considered low risk for cancer of the pelvic organs (ovaries, uterus, and vagina) and who do not have symptoms. Ask your health care provider if a screening pelvic exam is right for you.  If you have had past treatment for cervical cancer or a  condition that could lead to cancer, you need Pap tests and screening for cancer for at least 20 years after your treatment. If Pap tests have been discontinued, your risk factors (such as having a new sexual partner) need to be  reassessed to determine if screening should be resumed. Some women have medical problems that increase the chance of getting cervical cancer. In these cases, your health care provider may recommend more frequent screening and Pap tests.    Colorectal cancer can be detected and often prevented. Most routine colorectal cancer screening begins at the age of 62 years and continues through age 61 years. However, your health care provider may recommend screening at an earlier age if you have risk factors for colon cancer. On a yearly basis, your health care provider may provide home test kits to check for hidden blood in the stool. Use of a small camera at the end of a tube, to directly examine the colon (sigmoidoscopy or colonoscopy), can detect the earliest forms of colorectal cancer. Talk to your health care provider about this at age 65, when routine screening begins.  Direct exam of the colon should be repeated every 5-10 years through age 27 years, unless early forms of pre-cancerous polyps or small growths are found.  Osteoporosis is a disease in which the bones lose minerals and strength with aging. This can result in serious bone fractures or breaks. The risk of osteoporosis can be identified using a bone density scan. Women ages 16 years and over and women at risk for fractures or osteoporosis should discuss screening with their health care providers. Ask your health care provider whether you should take a calcium supplement or vitamin D to reduce the rate of osteoporosis.  Menopause can be associated with physical symptoms and risks. Hormone replacement therapy is available to decrease symptoms and risks. You should talk to your health care provider about whether hormone  replacement therapy is right for you.  Use sunscreen. Apply sunscreen liberally and repeatedly throughout the day. You should seek shade when your shadow is shorter than you. Protect yourself by wearing long sleeves, pants, a wide-brimmed hat, and sunglasses year round, whenever you are outdoors.  Once a month, do a whole body skin exam, using a mirror to look at the skin on your back. Tell your health care provider of new moles, moles that have irregular borders, moles that are larger than a pencil eraser, or moles that have changed in shape or color.  Stay current with required vaccines (immunizations).  Influenza vaccine. All adults should be immunized every year.  Tetanus, diphtheria, and acellular pertussis (Td, Tdap) vaccine. Pregnant women should receive 1 dose of Tdap vaccine during each pregnancy. The dose should be obtained regardless of the length of time since the last dose. Immunization is preferred during the 27th-36th week of gestation. An adult who has not previously received Tdap or who does not know her vaccine status should receive 1 dose of Tdap. This initial dose should be followed by tetanus and diphtheria toxoids (Td) booster doses every 10 years. Adults with an unknown or incomplete history of completing a 3-dose immunization series with Td-containing vaccines should begin or complete a primary immunization series including a Tdap dose. Adults should receive a Td booster every 10 years.    Zoster vaccine. One dose is recommended for adults aged 21 years or older unless certain conditions are present.    Pneumococcal 13-valent conjugate (PCV13) vaccine. When indicated, a person who is uncertain of her immunization history and has no record of immunization should receive the PCV13 vaccine. An adult aged 19 years or older who has certain medical conditions and has not been previously immunized should receive 1 dose of PCV13 vaccine. This PCV13 should  be followed with a dose of  pneumococcal polysaccharide (PPSV23) vaccine. The PPSV23 vaccine dose should be obtained at least 1 or more year(s) after the dose of PCV13 vaccine. An adult aged 58 years or older who has certain medical conditions and previously received 1 or more doses of PPSV23 vaccine should receive 1 dose of PCV13. The PCV13 vaccine dose should be obtained 1 or more years after the last PPSV23 vaccine dose.    Pneumococcal polysaccharide (PPSV23) vaccine. When PCV13 is also indicated, PCV13 should be obtained first. All adults aged 100 years and older should be immunized. An adult younger than age 1 years who has certain medical conditions should be immunized. Any person who resides in a nursing home or long-term care facility should be immunized. An adult smoker should be immunized. People with an immunocompromised condition and certain other conditions should receive both PCV13 and PPSV23 vaccines. People with human immunodeficiency virus (HIV) infection should be immunized as soon as possible after diagnosis. Immunization during chemotherapy or radiation therapy should be avoided. Routine use of PPSV23 vaccine is not recommended for American Indians, [place] Natives, or people younger than 65 years unless there are medical conditions that require PPSV23 vaccine. When indicated, people who have unknown immunization and have no record of immunization should receive PPSV23 vaccine. One-time revaccination 5 years after the first dose of PPSV23 is recommended for people aged 19-64 years who have chronic kidney failure, nephrotic syndrome, asplenia, or immunocompromised conditions. People who received 1-2 doses of PPSV23 before age 47 years should receive another dose of PPSV23 vaccine at age 9 years or later if at least 5 years have passed since the previous dose. Doses of PPSV23 are not needed for people immunized with PPSV23 at or after age 84 years.   Preventive Services / Frequency  Ages 107 years and over  Blood  pressure check.  Lipid and cholesterol check.  Lung cancer screening. / Every year if you are aged 74-80 years and have a 30-pack-year history of smoking and currently smoke or have quit within the past 15 years. Yearly screening is stopped once you have quit smoking for at least 15 years or develop a health problem that would prevent you from having lung cancer treatment.  Clinical breast exam.** / Every year after age 35 years.   BRCA-related cancer risk assessment.** / For women who have family members with a BRCA-related cancer (breast, ovarian, tubal, or peritoneal cancers).  Mammogram.** / Every year beginning at age 13 years and continuing for as long as you are in good health. Consult with your health care provider.  Pap test.** / Every 3 years starting at age 23 years through age 27 or 57 years with 3 consecutive normal Pap tests. Testing can be stopped between 65 and 70 years with 3 consecutive normal Pap tests and no abnormal Pap or HPV tests in the past 10 years.  Fecal occult blood test (FOBT) of stool. / Every year beginning at age 79 years and continuing until age 43 years. You may not need to do this test if you get a colonoscopy every 10 years.  Flexible sigmoidoscopy or colonoscopy.** / Every 5 years for a flexible sigmoidoscopy or every 10 years for a colonoscopy beginning at age 65 years and continuing until age 68 years.  Hepatitis C blood test.** / For all people born from [birth year] through 1965 and any individual with known risks for hepatitis C.  Osteoporosis screening.** / A one-time screening for women ages 56  years and over and women at risk for fractures or osteoporosis.  Skin self-exam. / Monthly.  Influenza vaccine. / Every year.  Tetanus, diphtheria, and acellular pertussis (Tdap/Td) vaccine.** / 1 dose of Td every 10 years.  Zoster vaccine.** / 1 dose for adults aged 39 years or older.  Pneumococcal 13-valent conjugate (PCV13) vaccine.** / Consult your health  care provider.  Pneumococcal polysaccharide (PPSV23) vaccine.** / 1 dose for all adults aged 58 years and older. Screening for abdominal aortic aneurysm (AAA)  by ultrasound is recommended for people who have history of high blood pressure or who are current or former smokers. ++++++++++++++++++++ Recommend Adult Low Dose Aspirin or  coated  Aspirin 81 mg daily  To reduce risk of Colon Cancer 40 %,  Skin Cancer 26 % ,  Melanoma 46%  and  Pancreatic cancer 60% ++++++++++++++++++++ Vitamin D goal  is between 70-100.  Please make sure that you are taking your Vitamin D as directed.  It is very important as a natural anti-inflammatory  helping hair, skin, and nails, as well as reducing stroke and heart attack risk.  It helps your bones and helps with mood. It also decreases numerous cancer risks so please take it as directed.  Low Vit D is associated with a 200-300% higher risk for CANCER  and 200-300% higher risk for HEART   ATTACK  &  STROKE.   .....................................Marland Kitchen It is also associated with higher death rate at younger ages,  autoimmune diseases like Rheumatoid arthritis, Lupus, Multiple Sclerosis.    Also many other serious conditions, like depression, Alzheimer's Dementia, infertility, muscle aches, fatigue, fibromyalgia - just to name a few. ++++++++++++++++++ Recommend the book "The END of DIETING" by Dr Excell Seltzer  & the book "The END of DIABETES " by Dr Excell Seltzer At [email or web address] - get book & Audio CD's    Being diabetic has a  300% increased risk for heart attack, stroke, cancer, and alzheimer- type vascular dementia. It is very important that you work harder with diet by avoiding all foods that are white. Avoid white rice (brown & wild rice is OK), white potatoes (sweetpotatoes in moderation is OK), White bread or wheat bread or anything made out of white flour like bagels, donuts, rolls, buns, biscuits, cakes, pastries, cookies, pizza crust, and pasta (made  from white flour & egg whites) - vegetarian pasta or spinach or wheat pasta is OK. Multigrain breads like Arnold's or [employer], or multigrain sandwich thins or flatbreads.  Diet, exercise and weight loss can reverse and cure diabetes in the early stages.  Diet, exercise and weight loss is very important in the control and prevention of complications of diabetes which affects every system in your body, ie. Brain - dementia/stroke, eyes - glaucoma/blindness, heart - heart attack/heart failure, kidneys - dialysis, stomach - gastric paralysis, intestines - malabsorption, nerves - severe painful neuritis, circulation - gangrene & loss of a leg(s), and finally cancer and Alzheimers.    I recommend avoid fried & greasy foods,  sweets/candy, white rice (brown or wild rice or Quinoa is OK), white potatoes (sweet potatoes are OK) - anything made from white flour - bagels, doughnuts, rolls, buns, biscuits,white and wheat breads, pizza crust and traditional pasta made of white flour & egg white(vegetarian pasta or spinach or wheat pasta is OK).  Multi-grain bread is OK - like multi-grain flat bread or sandwich thins. Avoid alcohol in excess. Exercise is also important.    Eat all the vegetables you want -  avoid meat, especially red meat and dairy - especially cheese.  Cheese is the most concentrated form of trans-fats which is the worst thing to clog up our arteries. Veggie cheese is OK which can be found in the fresh produce section at [employer] or [employer] or [employer]  +++++++++++++++++++ DASH Eating Plan  DASH stands for "Dietary Approaches to Stop Hypertension."   The DASH eating plan is a healthy eating plan that has been shown to reduce high blood pressure (hypertension). Additional health benefits may include reducing the risk of type 2 diabetes mellitus, heart disease, and stroke. The DASH eating plan may also help with weight loss. WHAT DO I NEED TO KNOW ABOUT THE DASH EATING PLAN? For the  DASH eating plan, you will follow these general guidelines:  Choose foods with a percent daily value for sodium of less than 5% (as listed on the food label).  Use salt-free seasonings or herbs instead of table salt or sea salt.  Check with your health care provider or pharmacist before using salt substitutes.  Eat lower-sodium products, often labeled as "lower sodium" or "no salt added."  Eat fresh foods.  Eat more vegetables, fruits, and low-fat dairy products.  Choose whole grains. Look for the word "whole" as the first word in the ingredient list.  Choose fish   Limit sweets, desserts, sugars, and sugary drinks.  Choose heart-healthy fats.  Eat veggie cheese   Eat more home-cooked food and less restaurant, buffet, and fast food.  Limit fried foods.  Cook foods using methods other than frying.  Limit canned vegetables. If you do use them, rinse them well to decrease the sodium.  When eating at a restaurant, ask that your food be prepared with less salt, or no salt if possible.                      WHAT FOODS CAN I EAT? Read Dr Fara Olden Fuhrman's books on The End of Dieting & The End of Diabetes  Grains Whole grain or whole wheat bread. Brown rice. Whole grain or whole wheat pasta. Quinoa, bulgur, and whole grain cereals. Low-sodium cereals. Corn or whole wheat flour tortillas. Whole grain cornbread. Whole grain crackers. Low-sodium crackers.  Vegetables Fresh or frozen vegetables (raw, steamed, roasted, or grilled). Low-sodium or reduced-sodium tomato and vegetable juices. Low-sodium or reduced-sodium tomato sauce and paste. Low-sodium or reduced-sodium canned vegetables.   Fruits All fresh, canned (in natural juice), or frozen fruits.  Protein Products  All fish and seafood.  Dried beans, peas, or lentils. Unsalted nuts and seeds. Unsalted canned beans.  Dairy Low-fat dairy products, such as skim or 1% milk, 2% or reduced-fat cheeses, low-fat ricotta or cottage  cheese, or plain low-fat yogurt. Low-sodium or reduced-sodium cheeses.  Fats and Oils Tub margarines without trans fats. Light or reduced-fat mayonnaise and salad dressings (reduced sodium). Avocado. Safflower, olive, or canola oils. Natural peanut or almond butter.  Other Unsalted popcorn and pretzels. The items listed above may not be a complete list of recommended foods or beverages. Contact your dietitian for more options.  +++++++++++++++  WHAT FOODS ARE NOT RECOMMENDED? Grains/ White flour or wheat flour White bread. White pasta. White rice. Refined cornbread. Bagels and croissants. Crackers that contain trans fat.  Vegetables  Creamed or fried vegetables. Vegetables in a . Regular canned vegetables. Regular canned tomato sauce and paste. Regular tomato and vegetable juices.  Fruits Dried fruits. Canned fruit in light or heavy syrup. Fruit juice.  Meat and Other Protein Products Meat in general - RED meat & White meat.  Fatty cuts of meat. Ribs, chicken wings, all processed meats as bacon, sausage, bologna, salami, fatback, hot dogs, bratwurst and packaged luncheon meats.  Dairy Whole or 2% milk, cream, half-and-half, and cream cheese. Whole-fat or sweetened yogurt. Full-fat cheeses or blue cheese. Non-dairy creamers and whipped toppings. Processed cheese, cheese spreads, or cheese curds.  Condiments Onion and garlic salt, seasoned salt, table salt, and sea salt. Canned and packaged gravies. Worcestershire sauce. Tartar sauce. Barbecue sauce. Teriyaki sauce. Soy sauce, including reduced sodium. Steak sauce. Fish sauce. Oyster sauce. Cocktail sauce. Horseradish. Ketchup and mustard. Meat flavorings and tenderizers. Bouillon cubes. Hot sauce. Tabasco sauce. Marinades. Taco seasonings. Relishes.  Fats and Oils Butter, stick margarine, lard, shortening and bacon fat. Coconut, palm kernel, or palm oils. Regular salad dressings.  Pickles and olives. Salted popcorn and  pretzels.  The items listed above may not be a complete list of foods and beverages to avoid.

## 2019-06-23 NOTE — Progress Notes (Signed)
Annual Screening/Preventative Visit & Comprehensive Evaluation &  Examination     This very nice 74 y.o. MWF  presents for a Screening /Preventative Visit & comprehensive evaluation and management of multiple medical co-morbidities.  Patient has been followed for HTN, HLD, Prediabetes  and Vitamin D Deficiency.     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 Initially she was treated with Topamax then Keppra - later d/c'd in 2020 and she has done fine since then.       Patient has hx/o labile HTN predates circa 2010. Patient's BP has been normal at home and patient denies any cardiac symptoms as chest pain, palpitations, shortness of breath, dizziness or ankle swelling. Hx/o Aortic atherosclerosis by CT scan. Today's BP is at goal - 117/80.      Patient's hyperlipidemia is controlled with diet and Rosuvastatin / Ezetimibe. Patient denies myalgias or other medication SE's. Last lipids were at goal:  Lab Results  Component Value Date   CHOL 160 06/24/2019   HDL 66 06/24/2019   LDLCALC 77 06/24/2019   TRIG 91 06/24/2019   CHOLHDL 2.4 06/24/2019       Patient has hx/o prediabetes (A1c 6.1% / 2011)  and patient denies reactive hypoglycemic symptoms, visual blurring, diabetic polys or paresthesias. Last A1c was near goal:  Lab Results  Component Value Date   HGBA1C 5.7 (H) 06/24/2019       Finally, patient has history of Vitamin D Deficiency ("18" / 2008)  and last Vitamin D was at goal:  Lab Results  Component Value Date   VD25OH 79 06/24/2019    Current Outpatient Medications on File Prior to Visit  Medication Sig  . ALPRAZolam (XANAX) 0.5 MG tablet Take 1/2 - 1 tablet   2 - 3 x /day ONLY if needed for Anxiety Attack &  limit to 5 days /week to avoid addiction  . Ascorbic Acid (VITAMIN C) 1000 MG tablet Take 1,000 mg by mouth daily.  Marland Kitchen aspirin EC 81 MG tablet Take 81 mg by mouth daily.  Marland Kitchen azelastine (ASTELIN) 0.1 % nasal  spray Place 2 sprays into both nostrils 2 (two) times daily. Use in each nostril as directed  . b complex vitamins capsule Take 1 capsule by mouth daily.  . Biotin 5000 MCG TABS Take 1 tablet by mouth daily.  . Cholecalciferol (VITAMIN D3) 125 MCG (5000 UT) CAPS Take 5,000 Units by mouth daily.  Marland Kitchen ezetimibe (ZETIA) 10 MG tablet Take 1 tablet Daily for Cholesterol  . fluticasone (FLONASE) 50 MCG/ACT nasal spray Place 2 sprays into both nostrils daily as needed for allergies.  . Multiple Vitamins-Minerals (MULTIVITAMIN PO) Take 1 tablet by mouth daily.  . Multiple Vitamins-Minerals (ZINC PO) Take 500 mg by mouth.  . rosuvastatin (CRESTOR) 20 MG tablet Take 1 tablet Daily for Cholesterol  . SUMAtriptan (IMITREX) 20 MG/ACT nasal spray USE ONE DOSE IN THE NOSE NOW, MAY REPEAT IN 2 HOURS. MAX OF 2 DOSES IN 24 HOURS. (Patient taking differently: Place 20 mg into the nose every 2 (two) hours as needed for migraine. )  . topiramate (TOPAMAX) 100 MG tablet TAKE 1 TABLET BY MOUTH TWICE DAILY FOR HEADACHE PREVENTION  . zinc gluconate 50 MG tablet Take 50 mg by mouth daily.   No current facility-administered medications on file prior to visit.   Allergies  Allergen Reactions  . Banana Nausea And Vomiting  . Chocolate Other (See Comments)    Unknown  .  Effexor [Venlafaxine] Other (See Comments)    insomnia  . Maxalt [Rizatriptan Benzoate] Nausea Only and Other (See Comments)    Nausea and excessive salivation  . Other Other (See Comments)    NUTS (all kinds)  . Tussin [Guaifenesin] Rash  . Zostavax [Zoster Vaccine Live] Itching and Rash   Past Medical History:  Diagnosis Date  . Allergy   . Anemia   . Anxiety   . Aortic atherosclerosis ([place]) 11/2016   Noted on CT abd  . Arthritis    hands and knees  . Bilateral cataracts    History of  . Cellulitis    Right finger  . Cerebral amyloid angiopathy (CODE)   . Cervical spondylosis 12/2008   Noted on Cervical spine  . Chronic nausea   .  Compression fracture of T12 vertebra ([facility]) 06/07/2017   Mild  . DDD (degenerative disc disease), cervical 12/2008   Noted on Cervical spine  . Depression   . Encephalopathy 01/02/2017   Mild, [place] [facility]  . GERD (gastroesophageal reflux disease)   . Helicobacter pylori infection 2018  . History of appendicitis    age 73  . History of colon polyps   . History of confusion   . History of hiatal hernia 10/16/2016   Noted on Endoscopy  . Hyperlipidemia   . Internal hemorrhoid 08/02/2017   Noted on Colonoscopy  . Low blood pressure reading   . Migraines   . Osteopenia   . PONV (postoperative nausea and vomiting)   . Pre-diabetes   . RLS (restless legs syndrome)   . Seizure ([place])    atypical partial comples vs. confusional migraines  . Skin cancer    right lower eyelid  . Squamous cell skin cancer    history of neck and upper back  . Syncope 03/25/2018  . TIA (transient ischemic attack)    questionable per patient  . Vitamin D deficiency    Health Maintenance  Topic Date Due  . INFLUENZA VACCINE  11/16/2018  . COLONOSCOPY  08/03/2019  . MAMMOGRAM  06/22/2021  . TETANUS/TDAP  05/09/2027  . DEXA SCAN  Completed  . Hepatitis C Screening  Completed  . PNA vac Low Risk Adult  Completed   Immunization History  Administered Date(s) Administered  . Influenza, High Dose Seasonal PF 01/01/2014, 03/15/2015, 12/13/2015, 01/03/2017, 02/05/2018  . Influenza-Unspecified 12/26/2012  . PFIZER SARS-COV-2 Vaccination 05/15/2019, 06/12/2019  . Pneumococcal Conjugate-13 08/06/2014  . Pneumococcal-Unspecified 12/26/2012  . Td 12/26/2012  . Tdap 05/08/2017  . Zoster 12/08/2010    Last Colon - 08/02/2017 - Dr Earlean Shawl - Recc f/u 2 years due Apr 2021  Last MGM - 03.08.2021  Past Surgical History:  Procedure Laterality Date  . APPENDECTOMY  1959  . CATARACT EXTRACTION, BILATERAL Bilateral 2014  . [place]  . COLONOSCOPY W/ POLYPECTOMY  08/02/2017  . KNEE ARTHROSCOPY  Bilateral 04/2014   [facility]  . MOHS SURGERY Right 04/2013   Squamous cell cacinoma, neck/upper back  . SPINE SURGERY  2010   C6-C7 fusion  . TOTAL KNEE ARTHROPLASTY Left 03/22/2018   Procedure: LEFT TOTAL KNEE ARTHROPLASTY;  Surgeon: Dorna Leitz, MD;  Location: [facility];  Service: Orthopedics;  Laterality: Left;  Adductor Block  . UPPER GI ENDOSCOPY  10/16/2016   Family History  Problem Relation Age of Onset  . Diabetes Mother   . Heart disease Mother   . Heart disease Father   . Stroke Father   . Hypertension Father   .  Hyperlipidemia Sister   . Hypertension Brother    Social History   Tobacco Use  . Smoking status: Former Smoker    Packs/day: 1.00    Years: 4.00    Pack years: 4.00    Types: Cigarettes    Quit date: 03/25/1986    Years since quitting: 33.2  . Smokeless tobacco: Never Used  Substance Use Topics  . Alcohol use: Not Currently    Comment: occasional  . Drug use: No    ROS Constitutional: Denies fever, chills, weight loss/gain, headaches, insomnia,  night sweats, and change in appetite. Does c/o fatigue. Eyes: Denies redness, blurred vision, diplopia, discharge, itchy, watery eyes.  ENT: Denies discharge, congestion, post nasal drip, epistaxis, sore throat, earache, hearing loss, dental pain, Tinnitus, Vertigo, Sinus pain, snoring.  Cardio: Denies chest pain, palpitations, irregular heartbeat, syncope, dyspnea, diaphoresis, orthopnea, PND, claudication, edema Respiratory: denies cough, dyspnea, DOE, pleurisy, hoarseness, laryngitis, wheezing.  Gastrointestinal: Denies dysphagia, heartburn, reflux, water brash, pain, cramps, nausea, vomiting, bloating, diarrhea, constipation, hematemesis, melena, hematochezia, jaundice, hemorrhoids Genitourinary: Denies dysuria, frequency, urgency, nocturia, hesitancy, discharge, hematuria, flank pain Breast: Breast lumps, nipple discharge, bleeding.  Musculoskeletal: Denies arthralgia, myalgia, stiffness, Jt. Swelling,  pain, limp, and strain/sprain. Denies falls. Skin: Denies puritis, rash, hives, warts, acne, eczema, changing in skin lesion Neuro: No weakness, tremor, incoordination, spasms, paresthesia, pain Psychiatric: Denies confusion, memory loss, sensory loss. Denies Depression. Endocrine: Denies change in weight, skin, hair change, nocturia, and paresthesia, diabetic polys, visual blurring, hyper / hypo glycemic episodes.  Heme/Lymph: No excessive bleeding, bruising, enlarged lymph nodes.  Physical Exam  BP 117/80   Pulse 80   Temp (!) 97.2 F (36.2 C)   Resp 16   Ht 5\' 6"  (1.676 m)   Wt 132 lb 3.2 oz (60 kg)   BMI 21.34 kg/m   General Appearance: Well nourished, well groomed and in no apparent distress.  Eyes: PERRLA, EOMs, conjunctiva no swelling or erythema, normal fundi and vessels. Sinuses: No frontal/maxillary tenderness ENT/Mouth: EACs patent / TMs  nl. Nares clear without erythema, swelling, mucoid exudates. Oral hygiene is good. No erythema, swelling, or exudate. Tongue normal, non-obstructing. Tonsils not swollen or erythematous. Hearing normal.  Neck: Supple, thyroid not palpable. No bruits, nodes or JVD. Respiratory: Respiratory effort normal.  BS equal and clear bilateral without rales, rhonci, wheezing or stridor. Cardio: Heart sounds are normal with regular rate and rhythm and no murmurs, rubs or gallops. Peripheral pulses are normal and equal bilaterally without edema. No aortic or femoral bruits. Chest: symmetric with normal excursions and percussion. Breasts: Symmetric, without lumps, nipple discharge, retractions, or fibrocystic changes.  Abdomen: Flat, soft with bowel sounds active. Nontender, no guarding, rebound, hernias, masses, or organomegaly.  Lymphatics: Non tender without lymphadenopathy.  Genitourinary:  Musculoskeletal: Full ROM all peripheral extremities, joint stability, 5/5 strength, and normal gait. Skin: Warm and dry without rashes, lesions, cyanosis,  clubbing or  ecchymosis.  Neuro: Cranial nerves intact, reflexes equal bilaterally. Normal muscle tone, no cerebellar symptoms. Sensation intact.  Pysch: Alert and oriented X 3, normal affect, Insight and Judgment appropriate.   Assessment and Plan  1. Annual Preventative Screening Examination  2. Labile hypertension  - EKG 12-Lead - Urinalysis, Routine w reflex microscopic - Microalbumin / creatinine urine ratio - CBC with Differential/Platelet - COMPLETE METABOLIC PANEL WITH GFR - Magnesium - TSH  3. Hyperlipidemia, mixed  - EKG 12-Lead - Lipid panel - TSH  4. Abnormal glucose  - EKG 12-Lead - Hemoglobin A1c - Insulin,  random  5. Vitamin D deficiency  - VITAMIN D 25 Hydroxy  6. Migraine without aura and without status migrainosus, not intractable  7. Prediabetes  - Hemoglobin A1c - Insulin, random  8. Screening for ischemic heart disease  - EKG 12-Lead  9. FHx: heart disease  - EKG 12-Lead  10. Former smoker  - EKG 12-Lead  11. Aortic atherosclerosis (HCC)  - EKG 12-Lead  12. Medication management  - Urinalysis, Routine w reflex microscopic - Microalbumin / creatinine urine ratio - CBC with Differential/Platelet - COMPLETE METABOLIC PANEL WITH GFR - Magnesium - Lipid panel - TSH - Hemoglobin A1c - Insulin, random - VITAMIN D 25 Hydroxy         Patient was counseled in prudent diet to achieve/maintain BMI less than 25 for weight control, BP monitoring, regular exercise and medications. Discussed med's effects and SE's. Screening labs and tests as requested with regular follow-up as recommended. Over 40 minutes of exam, counseling, chart review and high complex critical decision making was performed.   Kirtland Bouchard, MD

## 2019-06-24 ENCOUNTER — Other Ambulatory Visit: Payer: Self-pay

## 2019-06-24 ENCOUNTER — Ambulatory Visit: Payer: Medicare PPO | Admitting: Internal Medicine

## 2019-06-24 VITALS — BP 117/80 | HR 80 | Temp 97.2°F | Resp 16 | Ht 66.0 in | Wt 132.2 lb

## 2019-06-24 DIAGNOSIS — R7303 Prediabetes: Secondary | ICD-10-CM

## 2019-06-24 DIAGNOSIS — E782 Mixed hyperlipidemia: Secondary | ICD-10-CM | POA: Diagnosis not present

## 2019-06-24 DIAGNOSIS — Z87891 Personal history of nicotine dependence: Secondary | ICD-10-CM

## 2019-06-24 DIAGNOSIS — Z0001 Encounter for general adult medical examination with abnormal findings: Secondary | ICD-10-CM

## 2019-06-24 DIAGNOSIS — I700 Atherosclerosis of aorta: Secondary | ICD-10-CM

## 2019-06-24 DIAGNOSIS — R7309 Other abnormal glucose: Secondary | ICD-10-CM | POA: Diagnosis not present

## 2019-06-24 DIAGNOSIS — Z79899 Other long term (current) drug therapy: Secondary | ICD-10-CM | POA: Diagnosis not present

## 2019-06-24 DIAGNOSIS — R0989 Other specified symptoms and signs involving the circulatory and respiratory systems: Secondary | ICD-10-CM

## 2019-06-24 DIAGNOSIS — Z0000 Encounter for general adult medical examination without abnormal findings: Secondary | ICD-10-CM | POA: Diagnosis not present

## 2019-06-24 DIAGNOSIS — Z136 Encounter for screening for cardiovascular disorders: Secondary | ICD-10-CM

## 2019-06-24 DIAGNOSIS — G43009 Migraine without aura, not intractable, without status migrainosus: Secondary | ICD-10-CM

## 2019-06-24 DIAGNOSIS — E559 Vitamin D deficiency, unspecified: Secondary | ICD-10-CM

## 2019-06-24 DIAGNOSIS — Z8249 Family history of ischemic heart disease and other diseases of the circulatory system: Secondary | ICD-10-CM

## 2019-06-25 LAB — HEMOGLOBIN A1C
Hgb A1c MFr Bld: 5.7 % of total Hgb — ABNORMAL HIGH (ref <5.7)
Mean Plasma Glucose: 117 (calc)
eAG (mmol/L): 6.5 (calc)

## 2019-06-25 LAB — CBC WITH DIFFERENTIAL/PLATELET
Absolute Monocytes: 655 cells/uL (ref 200–950)
Basophils Absolute: 88 cells/uL (ref 0–200)
Basophils Relative: 1.4 %
Eosinophils Absolute: 151 cells/uL (ref 15–500)
Eosinophils Relative: 2.4 %
HCT: 38.7 % (ref 35.0–45.0)
Hemoglobin: 13 g/dL (ref 11.7–15.5)
Lymphs Abs: 1959 cells/uL (ref 850–3900)
MCH: 31 pg (ref 27.0–33.0)
MCHC: 33.6 g/dL (ref 32.0–36.0)
MCV: 92.4 fL (ref 80.0–100.0)
MPV: 10.1 fL (ref 7.5–12.5)
Monocytes Relative: 10.4 %
Neutro Abs: 3446 cells/uL (ref 1500–7800)
Neutrophils Relative %: 54.7 %
Platelets: 306 10*3/uL (ref 140–400)
RBC: 4.19 10*6/uL (ref 3.80–5.10)
RDW: 11.8 % (ref 11.0–15.0)
Total Lymphocyte: 31.1 %
WBC: 6.3 10*3/uL (ref 3.8–10.8)

## 2019-06-25 LAB — URINALYSIS, ROUTINE W REFLEX MICROSCOPIC
Bacteria, UA: NONE SEEN /HPF
Bilirubin Urine: NEGATIVE
Glucose, UA: NEGATIVE
Hgb urine dipstick: NEGATIVE
Hyaline Cast: NONE SEEN /LPF
Nitrite: NEGATIVE
Specific Gravity, Urine: 1.021 (ref 1.001–1.03)
pH: 6.5 (ref 5.0–8.0)

## 2019-06-25 LAB — VITAMIN D 25 HYDROXY (VIT D DEFICIENCY, FRACTURES): Vit D, 25-Hydroxy: 79 ng/mL (ref 30–100)

## 2019-06-25 LAB — LIPID PANEL
Cholesterol: 160 mg/dL (ref <200)
HDL: 66 mg/dL (ref >=50)
LDL Cholesterol (Calc): 77 mg/dL (calc)
Non-HDL Cholesterol (Calc): 94 mg/dL (calc) (ref <130)
Total CHOL/HDL Ratio: 2.4 (calc) (ref <5.0)
Triglycerides: 91 mg/dL (ref <150)

## 2019-06-25 LAB — COMPLETE METABOLIC PANEL WITH GFR
AG Ratio: 1.6 (calc) (ref 1.0–2.5)
ALT: 15 U/L (ref 6–29)
AST: 24 U/L (ref 10–35)
Albumin: 4.4 g/dL (ref 3.6–5.1)
Alkaline phosphatase (APISO): 73 U/L (ref 37–153)
BUN: 12 mg/dL (ref 7–25)
CO2: 26 mmol/L (ref 20–32)
Calcium: 9.9 mg/dL (ref 8.6–10.4)
Chloride: 106 mmol/L (ref 98–110)
Creat: 0.85 mg/dL (ref 0.60–0.93)
GFR, Est African American: 79 mL/min/{1.73_m2} (ref >=60)
GFR, Est Non African American: 68 mL/min/{1.73_m2} (ref >=60)
Globulin: 2.7 g/dL (calc) (ref 1.9–3.7)
Glucose, Bld: 94 mg/dL (ref 65–99)
Potassium: 4 mmol/L (ref 3.5–5.3)
Sodium: 138 mmol/L (ref 135–146)
Total Bilirubin: 0.5 mg/dL (ref 0.2–1.2)
Total Protein: 7.1 g/dL (ref 6.1–8.1)

## 2019-06-25 LAB — TSH: TSH: 3.59 mIU/L (ref 0.40–4.50)

## 2019-06-25 LAB — MICROALBUMIN / CREATININE URINE RATIO
Creatinine, Urine: 172 mg/dL (ref 20–275)
Microalb Creat Ratio: 16 mcg/mg creat (ref <30)
Microalb, Ur: 2.7 mg/dL

## 2019-06-25 LAB — MAGNESIUM: Magnesium: 2.3 mg/dL (ref 1.5–2.5)

## 2019-06-25 LAB — INSULIN, RANDOM: Insulin: 11.7 u[IU]/mL

## 2019-06-28 ENCOUNTER — Encounter: Payer: Self-pay | Admitting: Internal Medicine

## 2019-08-22 ENCOUNTER — Other Ambulatory Visit: Payer: Self-pay | Admitting: Adult Health

## 2019-08-22 DIAGNOSIS — G43009 Migraine without aura, not intractable, without status migrainosus: Secondary | ICD-10-CM

## 2019-09-05 DIAGNOSIS — D1801 Hemangioma of skin and subcutaneous tissue: Secondary | ICD-10-CM | POA: Diagnosis not present

## 2019-09-05 DIAGNOSIS — Z85828 Personal history of other malignant neoplasm of skin: Secondary | ICD-10-CM | POA: Diagnosis not present

## 2019-09-05 DIAGNOSIS — L905 Scar conditions and fibrosis of skin: Secondary | ICD-10-CM | POA: Diagnosis not present

## 2019-09-05 DIAGNOSIS — L820 Inflamed seborrheic keratosis: Secondary | ICD-10-CM | POA: Diagnosis not present

## 2019-09-05 DIAGNOSIS — D229 Melanocytic nevi, unspecified: Secondary | ICD-10-CM | POA: Diagnosis not present

## 2019-09-05 DIAGNOSIS — D225 Melanocytic nevi of trunk: Secondary | ICD-10-CM | POA: Diagnosis not present

## 2019-09-26 ENCOUNTER — Ambulatory Visit: Payer: Medicare Other | Admitting: Adult Health Nurse Practitioner

## 2019-10-02 ENCOUNTER — Encounter: Payer: Self-pay | Admitting: Adult Health Nurse Practitioner

## 2019-10-02 ENCOUNTER — Other Ambulatory Visit: Payer: Self-pay

## 2019-10-02 ENCOUNTER — Ambulatory Visit: Payer: Medicare PPO | Admitting: Adult Health Nurse Practitioner

## 2019-10-02 VITALS — BP 102/66 | HR 84 | Temp 97.2°F | Ht 66.0 in | Wt 137.4 lb

## 2019-10-02 DIAGNOSIS — K5909 Other constipation: Secondary | ICD-10-CM

## 2019-10-02 DIAGNOSIS — Z6824 Body mass index (BMI) 24.0-24.9, adult: Secondary | ICD-10-CM

## 2019-10-02 DIAGNOSIS — R6889 Other general symptoms and signs: Secondary | ICD-10-CM

## 2019-10-02 DIAGNOSIS — G43009 Migraine without aura, not intractable, without status migrainosus: Secondary | ICD-10-CM | POA: Diagnosis not present

## 2019-10-02 DIAGNOSIS — R7309 Other abnormal glucose: Secondary | ICD-10-CM | POA: Diagnosis not present

## 2019-10-02 DIAGNOSIS — E559 Vitamin D deficiency, unspecified: Secondary | ICD-10-CM

## 2019-10-02 DIAGNOSIS — E782 Mixed hyperlipidemia: Secondary | ICD-10-CM

## 2019-10-02 DIAGNOSIS — R0989 Other specified symptoms and signs involving the circulatory and respiratory systems: Secondary | ICD-10-CM

## 2019-10-02 DIAGNOSIS — Z0001 Encounter for general adult medical examination with abnormal findings: Secondary | ICD-10-CM | POA: Diagnosis not present

## 2019-10-02 DIAGNOSIS — E2839 Other primary ovarian failure: Secondary | ICD-10-CM

## 2019-10-02 DIAGNOSIS — Z0000 Encounter for general adult medical examination without abnormal findings: Secondary | ICD-10-CM

## 2019-10-02 DIAGNOSIS — F411 Generalized anxiety disorder: Secondary | ICD-10-CM | POA: Diagnosis not present

## 2019-10-02 DIAGNOSIS — K219 Gastro-esophageal reflux disease without esophagitis: Secondary | ICD-10-CM | POA: Diagnosis not present

## 2019-10-02 DIAGNOSIS — I700 Atherosclerosis of aorta: Secondary | ICD-10-CM

## 2019-10-02 DIAGNOSIS — Z8673 Personal history of transient ischemic attack (TIA), and cerebral infarction without residual deficits: Secondary | ICD-10-CM | POA: Diagnosis not present

## 2019-10-02 DIAGNOSIS — M8589 Other specified disorders of bone density and structure, multiple sites: Secondary | ICD-10-CM | POA: Diagnosis not present

## 2019-10-02 DIAGNOSIS — Z79899 Other long term (current) drug therapy: Secondary | ICD-10-CM

## 2019-10-02 DIAGNOSIS — J309 Allergic rhinitis, unspecified: Secondary | ICD-10-CM

## 2019-10-02 LAB — CBC WITH DIFFERENTIAL/PLATELET
Absolute Monocytes: 822 cells/uL (ref 200–950)
Basophils Absolute: 91 cells/uL (ref 0–200)
Basophils Relative: 1.1 %
Eosinophils Absolute: 208 cells/uL (ref 15–500)
Eosinophils Relative: 2.5 %
HCT: 37.4 % (ref 35.0–45.0)
Hemoglobin: 12.4 g/dL (ref 11.7–15.5)
Lymphs Abs: 2133 cells/uL (ref 850–3900)
MCH: 31 pg (ref 27.0–33.0)
MCHC: 33.2 g/dL (ref 32.0–36.0)
MCV: 93.5 fL (ref 80.0–100.0)
MPV: 9.9 fL (ref 7.5–12.5)
Monocytes Relative: 9.9 %
Neutro Abs: 5046 cells/uL (ref 1500–7800)
Neutrophils Relative %: 60.8 %
Platelets: 309 10*3/uL (ref 140–400)
RBC: 4 10*6/uL (ref 3.80–5.10)
RDW: 11.6 % (ref 11.0–15.0)
Total Lymphocyte: 25.7 %
WBC: 8.3 10*3/uL (ref 3.8–10.8)

## 2019-10-02 LAB — COMPLETE METABOLIC PANEL WITH GFR
AG Ratio: 1.8 (calc) (ref 1.0–2.5)
ALT: 17 U/L (ref 6–29)
AST: 22 U/L (ref 10–35)
Albumin: 4.2 g/dL (ref 3.6–5.1)
Alkaline phosphatase (APISO): 60 U/L (ref 37–153)
BUN: 13 mg/dL (ref 7–25)
CO2: 28 mmol/L (ref 20–32)
Calcium: 9.6 mg/dL (ref 8.6–10.4)
Chloride: 107 mmol/L (ref 98–110)
Creat: 0.81 mg/dL (ref 0.60–0.93)
GFR, Est African American: 84 mL/min/{1.73_m2} (ref >=60)
GFR, Est Non African American: 72 mL/min/{1.73_m2} (ref >=60)
Globulin: 2.3 g/dL (calc) (ref 1.9–3.7)
Glucose, Bld: 91 mg/dL (ref 65–99)
Potassium: 4.8 mmol/L (ref 3.5–5.3)
Sodium: 139 mmol/L (ref 135–146)
Total Bilirubin: 0.4 mg/dL (ref 0.2–1.2)
Total Protein: 6.5 g/dL (ref 6.1–8.1)

## 2019-10-02 LAB — LIPID PANEL
Cholesterol: 153 mg/dL (ref <200)
HDL: 64 mg/dL (ref >=50)
LDL Cholesterol (Calc): 70 mg/dL (calc)
Non-HDL Cholesterol (Calc): 89 mg/dL (calc) (ref <130)
Total CHOL/HDL Ratio: 2.4 (calc) (ref <5.0)
Triglycerides: 107 mg/dL (ref <150)

## 2019-10-02 NOTE — Patient Instructions (Addendum)
   Get Miralax, at any pharmacy.  Take one capful of this daily.  This is not a simulant, it help to pull water into your stool to help with constipation.  If your stool is TOO soft, use every other day or every third day.  You can also try Colace (Ducosate Sodium).  This is a stool softener, not a stimulant.  Take one tablet at night.  Also you can do every other day if too soft.  You can get this at any pharmacy but try the miralax first.   You can try some saline gel for your nasal cavities.      Ms. Foutz , Thank you for taking time to come for your Medicare Wellness Visit. I appreciate your ongoing commitment to your health goals. Please review the following plan we discussed and let me know if I can assist you in the future.   These are the goals we discussed: Goals    . Exercise 150 min/wk Moderate Activity     Weight bearing/resistance exercises twice weekly   You are due for a DEXA, Bone Density scan.  We will place an order and schedule this ith your mammogram in 2022.    This is a list of the screening recommended for you and due dates:  Health Maintenance  Topic Date Due  . Colon Cancer Screening  08/03/2019  . Flu Shot  11/16/2019  . Mammogram  06/22/2021  . Tetanus Vaccine  05/09/2027  . DEXA scan (bone density measurement)  Completed  . COVID-19 Vaccine  Completed  .  Hepatitis C: One time screening is recommended by Center for Disease Control  (CDC) for  adults born from [birth year] through 1965.   Completed  . Pneumonia vaccines  Completed

## 2019-10-02 NOTE — Progress Notes (Signed)
MEDICARE ANNUAL WELLNESS VISIT AND FOLLOW UP  Assessment:   Deborah [name] was seen today for follow-up and medicare wellness.  Diagnoses and all orders for this visit:  Medicare annual wellness visit, subsequent Yearly  Essential hypertension Controlled today -     CBC with Differential/Platelet -     COMPLETE METABOLIC PANEL WITH GFR -     Magnesium  Hyperlipidemia, mixed Continue Zetia 10mg  and Crestor 20mg  -     Lipid panel  Gastroesophageal reflux disease, esophagitis presence not specified Doing well at this time, continue to monitor diet and triggers No medications -     Magnesium  Abnormal glucose Discussed dieta dn exercise modifications -     Hemoglobin A1c -     Insulin, random  Vitamin D deficiency Continue supplementation -     VITAMIN D 25 Hydroxy (Vit-D Deficiency, Fractures)  Aortic atherosclerosis (HCC) Control blood pressure, lipids and glucose Disscused lifestyle modifications, diet & exercise Continue to monitor  Migraine without aura and without status migrainosus, not intractable Doing well on current regiment Continue sumatriptan PRN, has not used this year No longer taking Topamax for this Continue to monitor  Generalized anxiety disorder Doing well at this time Continue Alprazolam 0.5mg , half tablet a daily. Discussed less than 5 days a week, she has struggled with this. No longer taking lexapro  Osteopenia of multiple sites Continue VITAMIN D 25 Hydroxy (Vit-D Deficiency) DEXA UTD  Constipation Discussed OTC Miralax daily or every other day Increase fiber in diet Increase water intake  History of TIA Control blood pressure, lipids and glucose Disscused lifestyle modifications, diet & exercise Continue to monitor  Allergic Rhinitis Try OTC nasal saline gel Stop flonase if irritated  BMI 23.0-23.9, adult Discussed dietary and exercise modifications  Medication Management Continued  Over 40 minutes of face to face exam,  counseling, chart review and critical decision making was performed  Future Appointments  Date Time Provider Tib[place]  01/13/2020  9:30 AM Unk Pinto, MD [facility] None  07/01/2020 10:00 AM Unk Pinto, MD [facility] None  10/05/2020 10:00 AM Garnet Sierras, NP [facility] None    Plan:   During the course of the visit the patient was educated and counseled about appropriate screening and preventive services including:    Pneumococcal vaccine   Prevnar 13  Influenza vaccine  Td vaccine  Screening electrocardiogram  Bone densitometry screening  Colorectal cancer screening  Diabetes screening  Glaucoma screening  Nutrition counseling   Advanced directives: requested   Subjective:  Deborah [name] is a 74 y.o. female who presents for Medicare Annual Wellness Visit and 3 month follow up.    She reports she has been having stomach problems with some nausea.  She reports the other day she ate 1/4 of a cookie that had pecans.  She reports directly after she became bloated and has some abdominal pain with nausea.  She also reports her last bowel movement was two days ago and she had type1 with some straining.  She does eat prunes every day, 4-5.  She reports two bottles of water a day.  She normally eats breakfast with one cup of coffee.  Does not eat lunch typically but eats dinner around 4-5pm.She reports eating a large meal causes difficulty.  She reports last night she had chicken noddle soup, homemade.  She reports she has not had any symptoms like this in the past.  She has history of migraines.  She reports well controlled as she avoid triggers to this.  She has history of incontinence in the past but reports she is doing well at this time.  She battles with seasonal allergies and is using Flonase PRN.  She also uses OTC antihistamines, varies.  Reports her nasal passage are very dry and become tender at times.  She reports she stops flonase if this  occurs with improvement.  She has tried OTC nasal saline with minimal improvement but does eventually resolve.   Her blood pressure has been controlled at home, today their BP is BP: 102/66 She does workout. She denies chest pain, shortness of breath, dizziness.  She is on cholesterol medication and denies myalgias. Her cholesterol is at goal. The cholesterol last visit was:   Lab Results  Component Value Date   CHOL 153 10/02/2019   HDL 64 10/02/2019   LDLCALC 70 10/02/2019   TRIG 107 10/02/2019   CHOLHDL 2.4 10/02/2019   . She has been working on diet and exercise for abnormal glucose and denies increased appetite, paresthesia of the feet, polydipsia, polyuria, visual disturbances and vomiting. Last A1C in the office was:  Lab Results  Component Value Date   HGBA1C 5.7 (H) 06/24/2019   Last GFR:   Lab Results  Component Value Date   GFRNONAA 72 10/02/2019   Lab Results  Component Value Date   GFRAA 84 10/02/2019   Patient is on Vitamin D supplement for defciency (18 / 2008) Lab Results  Component Value Date   VD25OH 79 06/24/2019      Medication Review: Current Outpatient Medications on File Prior to Visit  Medication Sig Dispense Refill  . ALPRAZolam (XANAX) 0.5 MG tablet Take 1/2 - 1 tablet   2 - 3 x /day ONLY if needed for Anxiety Attack &  limit to 5 days /week to avoid addiction 60 tablet 0  . Ascorbic Acid (VITAMIN C) 1000 MG tablet Take 1,000 mg by mouth daily.    Marland Kitchen aspirin EC 81 MG tablet Take 81 mg by mouth daily.    Marland Kitchen azelastine (ASTELIN) 0.1 % nasal spray Place 2 sprays into both nostrils 2 (two) times daily. Use in each nostril as directed 30 mL 2  . b complex vitamins capsule Take 1 capsule by mouth daily.    . Cholecalciferol (VITAMIN D3) 125 MCG (5000 UT) CAPS Take 5,000 Units by mouth daily.    Marland Kitchen ezetimibe (ZETIA) 10 MG tablet Take 1 tablet Daily for Cholesterol 90 tablet 3  . fluticasone (FLONASE) 50 MCG/ACT nasal spray Place 2 sprays into both nostrils  daily as needed for allergies. 16 g 3  . Multiple Vitamins-Minerals (MULTIVITAMIN PO) Take 1 tablet by mouth daily.    . Multiple Vitamins-Minerals (ZINC PO) Take 500 mg by mouth.    . rosuvastatin (CRESTOR) 20 MG tablet Take 1 tablet Daily for Cholesterol 90 tablet 3  . SUMAtriptan (IMITREX) 20 MG/ACT nasal spray USE ONE DOSE IN THE NOSE NOW, MAY REPEAT IN 2 HOURS. MAX OF 2 DOSES IN 24 HOURS. (Patient taking differently: Place 20 mg into the nose every 2 (two) hours as needed for migraine. ) 12 Inhaler 2  . zinc gluconate 50 MG tablet Take 50 mg by mouth daily.     No current facility-administered medications on file prior to visit.    Allergies  Allergen Reactions  . Banana Nausea And Vomiting  . Chocolate Other (See Comments)    Unknown  . Effexor [Venlafaxine] Other (See Comments)    insomnia  . Maxalt [Rizatriptan Benzoate] Nausea Only  and Other (See Comments)    Nausea and excessive salivation  . Other Other (See Comments)    NUTS (all kinds)  . Tussin [Guaifenesin] Rash  . Zostavax [Zoster Vaccine Live] Itching and Rash    Current Problems (verified) Patient Active Problem List   Diagnosis Date Noted  . Syncope 03/25/2018  . Primary osteoarthritis of left knee 03/22/2018  . GERD (gastroesophageal reflux disease) 02/25/2018  . Osteopenia 02/25/2018  . Compression fracture of T12 vertebra ([place]) 05/30/2017  . Aortic atherosclerosis ([place]) 02/03/2017  . History of TIA (transient ischemic attack) 12/15/2016  . BMI 24.0-24.9, adult 03/12/2015  . Skin cancer 08/06/2014  . Migraine 08/06/2014  . Generalized anxiety disorder 08/06/2014  . Abnormal glucose 07/01/2013  . Medication management 07/01/2013  . Hypertension   . Hyperlipidemia   . Vitamin D deficiency     Screening Tests Immunization History  Administered Date(s) Administered  . Influenza, High Dose Seasonal PF 01/01/2014, 03/15/2015, 12/13/2015, 01/03/2017, 02/05/2018  . Influenza-Unspecified 12/26/2012  .  PFIZER SARS-COV-2 Vaccination 05/15/2019, 06/12/2019  . Pneumococcal Conjugate-13 08/06/2014  . Pneumococcal-Unspecified 12/26/2012  . Td 12/26/2012  . Tdap 05/08/2017  . Zoster 12/08/2010    Preventative care: Last colonoscopy: 2019, polyps due for 2021 Last mammogram: 06/2019  DEXA: 2019, Due order placed today  Prior vaccinations: TD or Tdap: 2019  Influenza: 2019 Pneumococcal: 2013 Prevnar13: 2016 Shingles/Zostavax: 2012  Names of Other Physician/Practitioners you currently use: 1. [facility] here for primary care 2. Eye Exam 2018, Due for 2021 3. Dental Exam 2021  Patient Care Team: Unk Pinto, MD as PCP - General (Internal Medicine) Richmond Campbell, MD as Consulting Physician (Gastroenterology) Shirley Muscat Loreen Freud, MD as Referring Physician (Optometry) Berenice Primas, MD as Referring Physician (Orthopedic Surgery) Janann August, MD as Referring Physician (Dermatology)  SURGICAL HISTORY She  has a past surgical history that includes Spine surgery (2010); Mohs surgery (Right, 04/2013); Knee arthroscopy (Bilateral, 04/2014); Appendectomy (1959); Cesarean section (1978); Cataract extraction, bilateral (Bilateral, 2014); Colonoscopy w/ polypectomy (08/02/2017); Upper gi endoscopy (10/16/2016); and Total knee arthroplasty (Left, 03/22/2018). FAMILY HISTORY Her family history includes Diabetes in her mother; Heart disease in her father and mother; Hyperlipidemia in her sister; Hypertension in her brother and father; Stroke in her father. SOCIAL HISTORY She  reports that she quit smoking about 33 years ago. Her smoking use included cigarettes. She has a 4.00 pack-year smoking history. She has never used smokeless tobacco. She reports previous alcohol use. She reports that she does not use drugs.   MEDICARE WELLNESS OBJECTIVES: Physical activity: Current Exercise Habits: The patient does not participate in regular exercise at  present, Exercise limited by: None identified Cardiac risk factors: Cardiac Risk Factors include: none Depression/mood screen:   Depression screen [facility] 2/9 10/02/2019  Decreased Interest 0  Down, Depressed, Hopeless 0  PHQ - 2 Score 0    ADLs:  In your present state of health, do you have any difficulty performing the following activities: 10/02/2019 06/23/2019  Hearing? N N  Vision? N N  Difficulty concentrating or making decisions? N N  Walking or climbing stairs? N N  Dressing or bathing? N N  Doing errands, shopping? N N  Preparing Food and eating ? N -  Using the Toilet? N -  In the past six months, have you accidently leaked urine? N -  Do you have problems with loss of bowel control? N -  Managing your Medications? N -  Managing your Finances? N -  Housekeeping or managing your Housekeeping? N -  Some recent data might be hidden     Cognitive Testing  Alert? Yes  Normal Appearance?Yes  Oriented to person? Yes  Place? Yes   Time? Yes  Recall of three objects?  Yes  Can perform simple calculations? Yes  Displays appropriate judgment?Yes  Can read the correct time from a watch face?Yes  EOL planning: Does Patient Have a Medical Advance Directive?: No Would patient like information on creating a medical advance directive?: No - Patient declined  Review of Systems  Constitutional: Negative for chills, diaphoresis, fever, malaise/fatigue and weight loss.  HENT: Negative for congestion, ear discharge, ear pain, hearing loss, nosebleeds, sinus pain, sore throat and tinnitus.   Eyes: Negative for blurred vision, double vision, photophobia, pain, discharge and redness.  Respiratory: Negative for cough, hemoptysis, sputum production, shortness of breath, wheezing and stridor.   Cardiovascular: Negative for chest pain, palpitations, orthopnea, claudication, leg swelling and PND.  Gastrointestinal: Negative for abdominal pain, blood in stool, constipation, diarrhea, heartburn,  melena, nausea and vomiting.  Genitourinary: Negative for dysuria, flank pain, frequency, hematuria and urgency.  Musculoskeletal: Negative for back pain, falls, joint pain, myalgias and neck pain.  Skin: Negative for itching and rash.  Neurological: Negative for dizziness, tingling, tremors, sensory change, speech change, focal weakness, seizures, loss of consciousness, weakness and headaches.  Endo/Heme/Allergies: Negative for environmental allergies and polydipsia. Does not bruise/bleed easily.  Psychiatric/Behavioral: Negative for depression, hallucinations, memory loss, substance abuse and suicidal ideas. The patient is nervous/anxious. The patient does not have insomnia.        Reports daily panic attacks.       Objective:     Today's Vitals   10/02/19 0919  BP: 102/66  Pulse: 84  Temp: (!) 97.2 F (36.2 C)  SpO2: 97%  Weight: 137 lb 6.4 oz (62.3 kg)  Height: 5\' 6"  (1.676 m)   Body mass index is 22.18 kg/m.  General appearance: alert, no distress, WD/WN, female HEENT: normocephalic, sclerae anicteric, TMs pearly, nares patent, no discharge or erythema, pharynx normal Oral cavity: MMM, no lesions Neck: supple, no lymphadenopathy, no thyromegaly, no masses Heart: RRR, normal S1, S2, no murmurs Lungs: CTA bilaterally, no wheezes, rhonchi, or rales Abdomen: +bs, soft, non tender, non distended, no masses, no hepatomegaly, no splenomegaly Musculoskeletal: nontender, no swelling, no obvious deformity Extremities: no edema, no cyanosis, no clubbing Pulses: 2+ symmetric, upper and lower extremities, normal cap refill Neurological: alert, oriented x 3, CN2-12 intact, strength normal upper extremities and lower extremities, sensation normal throughout, DTRs 2+ throughout, no cerebellar signs, gait normal Psychiatric: normal affect, behavior normal, pleasant   Medicare Attestation I have personally reviewed: The patient's medical and social history Their use of alcohol, tobacco  or illicit drugs Their current medications and supplements The patient's functional ability including ADLs,fall risks, home safety risks, cognitive, and hearing and visual impairment Diet and physical activities Evidence for depression or mood disorders  The patient's weight, height, BMI, and visual acuity have been recorded in the chart.  I have made referrals, counseling, and provided education to the patient based on review of the above and I have provided the patient with a written personalized care plan for preventive services.     Garnet Sierras, NP [facility] 10/02/2019  10:26 AM

## 2019-10-11 ENCOUNTER — Encounter: Payer: Self-pay | Admitting: Adult Health Nurse Practitioner

## 2019-10-30 ENCOUNTER — Other Ambulatory Visit: Payer: Medicare PPO

## 2019-11-25 ENCOUNTER — Other Ambulatory Visit: Payer: Self-pay | Admitting: Internal Medicine

## 2019-11-25 DIAGNOSIS — E782 Mixed hyperlipidemia: Secondary | ICD-10-CM

## 2019-11-25 MED ORDER — ALPRAZOLAM 0.5 MG PO TABS: ORAL_TABLET | ORAL | 0 refills | Status: DC

## 2019-12-24 ENCOUNTER — Ambulatory Visit: Payer: Medicare PPO | Admitting: Adult Health

## 2019-12-24 ENCOUNTER — Other Ambulatory Visit: Payer: Self-pay

## 2019-12-24 ENCOUNTER — Encounter: Payer: Self-pay | Admitting: Adult Health

## 2019-12-24 VITALS — BP 108/68 | HR 74 | Temp 97.5°F | Ht 66.0 in | Wt 137.8 lb

## 2019-12-24 DIAGNOSIS — L509 Urticaria, unspecified: Secondary | ICD-10-CM | POA: Diagnosis not present

## 2019-12-24 MED ORDER — PREDNISONE 20 MG PO TABS: ORAL_TABLET | ORAL | 0 refills | Status: DC

## 2019-12-24 NOTE — Patient Instructions (Signed)
Stop benadryl  Start zyrtec daily Famotidine twice daily   Start prednisone  STOP ALL SUPPLEMENTS  If persistent hives stop cholesterol medications,   Follow up if persistent       Hives Hives (urticaria) are itchy, red, swollen areas on the skin. Hives can appear on any part of the body. Hives often fade within 24 hours (acute hives). Sometimes, new hives appear after old ones fade and the cycle can continue for several days or weeks (chronic hives). Hives do not spread from person to person (are not contagious). Hives come from the body's reaction to something a person is allergic to (allergen), something that causes irritation, or various other triggers. When a person is exposed to a trigger, his or her body releases a chemical (histamine) that causes redness, itching, and swelling. Hives can appear right after exposure to a trigger or hours later. What are the causes? This condition may be caused by:  Allergies to foods or ingredients.  Insect bites or stings.  Exposure to pollen or pets.  Contact with latex or chemicals.  Spending time in sunlight, heat, or cold (exposure).  Exercise.  Stress.  Certain medicines. You can also get hives from other medical conditions and treatments, such as:  Viruses, including the common cold.  Bacterial infections, such as urinary tract infections and strep throat.  Certain medicines.  Allergy shots.  Blood transfusions. Sometimes, the cause of this condition is not known (idiopathic hives). What increases the risk? You are more likely to develop this condition if you:  Are a woman.  Have food allergies, especially to citrus fruits, milk, eggs, peanuts, tree nuts, or shellfish.  Are allergic to: ? Medicines. ? Latex. ? Insects. ? Animals. ? Pollen. What are the signs or symptoms? Common symptoms of this condition include raised, itchy, red or white bumps or patches on your skin. These areas may:  Become large and  swollen (welts).  Change in shape and location, quickly and repeatedly.  Be separate hives or connect over a large area of skin.  Sting or become painful.  Turn white when pressed in the center (blanch). In severe cases, yourhands, feet, and face may also become swollen. This may occur if hives develop deeper in your skin. How is this diagnosed? This condition may be diagnosed by your symptoms, medical history, and physical exam.  Your skin, urine, or blood may be tested to find out what is causing your hives and to rule out other health issues.  Your health care provider may also remove a small sample of skin from the affected area and examine it under a microscope (biopsy). How is this treated? Treatment for this condition depends on the cause and severity of your symptoms. Your health care provider may recommend using cool, wet cloths (cool compresses) or taking cool showers to relieve itching. Treatment may include:  Medicines that help: ? Relieve itching (antihistamines). ? Reduce swelling (corticosteroids). ? Treat infection (antibiotics).  An injectable medicine (omalizumab). Your health care provider may prescribe this if you have chronic idiopathic hives and you continue to have symptoms even after treatment with antihistamines. Severe cases may require an emergency injection of adrenaline (epinephrine) to prevent a life-threatening allergic reaction (anaphylaxis). Follow these instructions at home: Medicines  Take and apply over-the-counter and prescription medicines only as told by your health care provider.  If you were prescribed an antibiotic medicine, take it as told by your health care provider. Do not stop using the antibiotic even if you  start to feel better. Skin care  Apply cool compresses to the affected areas.  Do not scratch or rub your skin. General instructions  Do not take hot showers or baths. This can make itching worse.  Do not wear tight-fitting  clothing.  Use sunscreen and wear protective clothing when you are outside.  Avoid any substances that cause your hives. Keep a journal to help track what causes your hives. Write down: ? What medicines you take. ? What you eat and drink. ? What products you use on your skin.  Keep all follow-up visits as told by your health care provider. This is important. Contact a health care provider if:  Your symptoms are not controlled with medicine.  Your joints are painful or swollen. Get help right away if:  You have a fever.  You have pain in your abdomen.  Your tongue or lips are swollen.  Your eyelids are swollen.  Your chest or throat feels tight.  You have trouble breathing or swallowing. These symptoms may represent a serious problem that is an emergency. Do not wait to see if the symptoms will go away. Get medical help right away. Call your local emergency services (911 in the [place] Do not drive yourself to the hospital. Summary  Hives (urticaria) are itchy, red, swollen areas on your skin. Hives come from the body's reaction to something a person is allergic to (allergen), something that causes irritation, or various other triggers.  Treatment for this condition depends on the cause and severity of your symptoms.  Avoid any substances that cause your hives. Keep a journal to help track what causes your hives.  Take and apply over-the-counter and prescription medicines only as told by your health care provider.  Keep all follow-up visits as told by your health care provider. This is important. This information is not intended to replace advice given to you by your health care provider. Make sure you discuss any questions you have with your health care provider. Document Revised: 10/17/2017 Document Reviewed: 10/17/2017 Elsevier Patient Education  [place].

## 2019-12-24 NOTE — Progress Notes (Signed)
Assessment and Plan:  Deborah [name] was seen today for urticaria.  Diagnoses and all orders for this visit:  Urticaria Unclear etiology; ? Stress reaction, or possible delayed med reaction No personal or family autoimmune hx; ANA/autoimmune workup deferred for now Will check CBC, sed, complements Stop benadryl, start zyrtec and famotidine BID Stop all supplements, aspirin, start prednisone If persistent hives in 1 week, stop all meds, then slowly reintroduce 1 at a time If no change, follow up and consider ANA, TPO, etc Consider referral to allergist vs derm if not resolving and unexplained -     CBC with Differential/Platelet -     Sedimentation rate -     C-reactive protein -     Complement, total -     C3 and C4 -     predniSONE (DELTASONE) 20 MG tablet; 2 tablets daily for 3 days, 1 tablet daily for 4 days.  Further disposition pending results of labs. Discussed med's effects and SE's.   Over 15 minutes of exam, counseling, chart review, and critical decision making was performed.   Future Appointments  Date Time Provider [place]  01/13/2020  9:30 AM Unk Pinto, MD [facility] None  01/23/2020  2:00 PM [facility] DX DEXA 1 [facility]  07/01/2020 10:00 AM Unk Pinto, MD [facility] None  10/05/2020 10:00 AM McClanahan, [place] Sewer, NP [facility] None    ------------------------------------------------------------------------------------------------------------------   HPI BP 108/68   Pulse 74   Temp (!) 97.5 F (36.4 C)   Ht 5\' 6"  (1.676 m)   Wt 137 lb 12.8 oz (62.5 kg)   SpO2 98%   BMI 22.24 kg/m   73 y.o.female presents for evaluation of 1 month of persistent hives.   She reports 4 weeks ago noted itching and hives (no hx of hives) to her thighs, torso, arms, has been persistent and progressive, sparing palms/soles and mouth. Pruritic, has been taking benadryl, taking 25 mg q4 hours but without significant benefit.   No new meds, supplements, atypical  foods, changes in products or detergents in the last 6 months.   She reports has had stressful year, son developed a. Fib, she was staying with him, started having hives while she was there, has been back in her home for 2 weeks, hives have persisted.   Denies night sweats, fatigue, unintended weight loss  Has xanax PRN for panic attacks, needed intermittently, previously 0-2, recently 4-5 tabs per week, takes 1/2 tab.   No personal or family autoimmune hx  Past Medical History:  Diagnosis Date  . Allergy   . Anemia   . Anxiety   . Aortic atherosclerosis ([place]) 11/2016   Noted on CT abd  . Arthritis    hands and knees  . Bilateral cataracts    History of  . Cellulitis    Right finger  . Cerebral amyloid angiopathy (CODE)   . Cervical spondylosis 12/2008   Noted on Cervical spine  . Chronic nausea   . Compression fracture of T12 vertebra (HCC) 06/07/2017   Mild  . DDD (degenerative disc disease), cervical 12/2008   Noted on Cervical spine  . Delusions ([place]) 03/24/2018  . Depression   . Encephalopathy 01/02/2017   Mild, [facility]  . GERD (gastroesophageal reflux disease)   . Helicobacter pylori infection 2018  . History of appendicitis    age 75  . History of colon polyps   . History of confusion   . History of hiatal hernia 10/16/2016   Noted on Endoscopy  .  Hyperlipidemia   . Incontinence in female 08/06/2014  . Internal hemorrhoid 08/02/2017   Noted on Colonoscopy  . Low blood pressure reading   . Migraines   . Osteopenia   . PONV (postoperative nausea and vomiting)   . Pre-diabetes   . RLS (restless legs syndrome)   . Seizure ([place])    atypical partial comples vs. confusional migraines  . Seizure disorder ([place]) 12/15/2016  . Skin cancer    right lower eyelid  . Squamous cell skin cancer    history of neck and upper back  . Syncope 03/25/2018  . TIA (transient ischemic attack)    questionable per patient  . Vitamin D deficiency      Allergies  Allergen  Reactions  . Banana Nausea And Vomiting  . Chocolate Other (See Comments)    Unknown  . Effexor [Venlafaxine] Other (See Comments)    insomnia  . Maxalt [Rizatriptan Benzoate] Nausea Only and Other (See Comments)    Nausea and excessive salivation  . Other Other (See Comments)    NUTS (all kinds)  . Tussin [Guaifenesin] Rash  . Zostavax [Zoster Vaccine Live] Itching and Rash    Current Outpatient Medications on File Prior to Visit  Medication Sig  . ALPRAZolam (XANAX) 0.5 MG tablet Take 1/2 - 1 tablet   2 - 3 x /day ONLY if needed for Anxiety Attack &  limit to 5 days /week to avoid addiction  . Ascorbic Acid (VITAMIN C) 1000 MG tablet Take 1,000 mg by mouth daily.  Marland Kitchen aspirin EC 81 MG tablet Take 81 mg by mouth daily.  Marland Kitchen azelastine (ASTELIN) 0.1 % nasal spray Place 2 sprays into both nostrils 2 (two) times daily. Use in each nostril as directed  . b complex vitamins capsule Take 1 capsule by mouth daily.  . Cholecalciferol (VITAMIN D3) 125 MCG (5000 UT) CAPS Take 5,000 Units by mouth daily.  Marland Kitchen ezetimibe (ZETIA) 10 MG tablet TAKE 1 TABLET ONCE DAILY FOR CHOLESTEROL  . fluticasone (FLONASE) 50 MCG/ACT nasal spray Place 2 sprays into both nostrils daily as needed for allergies.  . Multiple Vitamins-Minerals (MULTIVITAMIN PO) Take 1 tablet by mouth daily.  . rosuvastatin (CRESTOR) 20 MG tablet TAKE 1 TABLET  ONCE DAILY FOR CHOLESTEROL  . SUMAtriptan (IMITREX) 20 MG/ACT nasal spray USE ONE DOSE IN THE NOSE NOW, MAY REPEAT IN 2 HOURS. MAX OF 2 DOSES IN 24 HOURS. (Patient taking differently: Place 20 mg into the nose every 2 (two) hours as needed for migraine. )  . zinc gluconate 50 MG tablet Take 50 mg by mouth daily.   No current facility-administered medications on file prior to visit.    Allergies:  Allergies  Allergen Reactions  . Banana Nausea And Vomiting  . Chocolate Other (See Comments)    Unknown  . Effexor [Venlafaxine] Other (See Comments)    insomnia  . Maxalt  [Rizatriptan Benzoate] Nausea Only and Other (See Comments)    Nausea and excessive salivation  . Other Other (See Comments)    NUTS (all kinds)  . Tussin [Guaifenesin] Rash  . Zostavax [Zoster Vaccine Live] Itching and Rash   Medical History:  has Hypertension; Hyperlipidemia; Vitamin D deficiency; Abnormal glucose; Medication management; Skin cancer; Migraine; Generalized anxiety disorder; BMI 24.0-24.9, adult; History of TIA (transient ischemic attack); Aortic atherosclerosis ([place]); Compression fracture of T12 vertebra (HCC); GERD (gastroesophageal reflux disease); Osteopenia; Primary osteoarthritis of left knee; and Syncope on their problem list.  Family History:  Herfamily history includes Diabetes  in her mother; Heart disease in her father and mother; Hyperlipidemia in her sister; Hypertension in her brother and father; Stroke in her father. Social History:   reports that she quit smoking about 33 years ago. Her smoking use included cigarettes. She has a 4.00 pack-year smoking history. She has never used smokeless tobacco. She reports previous alcohol use. She reports that she does not use drugs.   ROS: all negative except above.   Physical Exam:  BP 108/68   Pulse 74   Temp (!) 97.5 F (36.4 C)   Ht 5\' 6"  (1.676 m)   Wt 137 lb 12.8 oz (62.5 kg)   SpO2 98%   BMI 22.24 kg/m   General Appearance: Well nourished, in no apparent distress. Eyes: PERRLA, EOMs, conjunctiva no swelling or erythema ENT/Mouth: Ext aud canals clear, TMs without erythema, bulging. No erythema, swelling, or exudate on post pharynx.  Tonsils not swollen or erythematous. Hearing normal.  Neck: Supple, thyroid normal.  Respiratory: Respiratory effort normal, BS equal bilaterally without rales, rhonchi, wheezing or stridor.  Cardio: RRR with no MRGs. Brisk peripheral pulses without edema.  Abdomen: Soft, + BS.  Non tender, no guarding, rebound, hernias, masses. Lymphatics: Non tender without  lymphadenopathy.  Musculoskeletal: no deformity, symmetrical strength and ROM, normal gait.  Skin: Warm, dry; she has scattered blanchable erythematous papular areas to bil arms, legs, torso; sparing face, hands, feet Neuro: Cranial nerves intact. Normal muscle tone, no cerebellar symptoms. Sensation intact.  Psych: Awake and oriented X 3, normal affect, Insight and Judgment appropriate.     Izora Ribas, NP 2:49 PM [facility]

## 2019-12-25 LAB — CBC WITH DIFFERENTIAL/PLATELET
Absolute Monocytes: 598 cells/uL (ref 200–950)
Basophils Absolute: 61 cells/uL (ref 0–200)
Basophils Relative: 0.9 %
Eosinophils Absolute: 136 cells/uL (ref 15–500)
Eosinophils Relative: 2 %
HCT: 37.1 % (ref 35.0–45.0)
Hemoglobin: 12.1 g/dL (ref 11.7–15.5)
Lymphs Abs: 2278 cells/uL (ref 850–3900)
MCH: 30.3 pg (ref 27.0–33.0)
MCHC: 32.6 g/dL (ref 32.0–36.0)
MCV: 93 fL (ref 80.0–100.0)
MPV: 9.9 fL (ref 7.5–12.5)
Monocytes Relative: 8.8 %
Neutro Abs: 3726 cells/uL (ref 1500–7800)
Neutrophils Relative %: 54.8 %
Platelets: 297 10*3/uL (ref 140–400)
RBC: 3.99 10*6/uL (ref 3.80–5.10)
RDW: 11.8 % (ref 11.0–15.0)
Total Lymphocyte: 33.5 %
WBC: 6.8 10*3/uL (ref 3.8–10.8)

## 2019-12-25 LAB — C3 AND C4
C3 Complement: 104 mg/dL (ref 83–193)
C4 Complement: 18 mg/dL (ref 15–57)

## 2019-12-25 LAB — COMPLEMENT, TOTAL: Compl, Total (CH50): 47 U/mL (ref 31–60)

## 2019-12-25 LAB — SEDIMENTATION RATE: Sed Rate: 33 mm/h — ABNORMAL HIGH (ref 0–30)

## 2019-12-25 LAB — C-REACTIVE PROTEIN: CRP: 1.8 mg/L (ref <8.0)

## 2020-01-05 ENCOUNTER — Telehealth: Payer: Self-pay

## 2020-01-05 NOTE — Telephone Encounter (Signed)
Patient states that her hives were better but have returned. Does not want Prednisone again-causes HA. Please advise.

## 2020-01-13 ENCOUNTER — Ambulatory Visit: Payer: Medicare PPO | Admitting: Internal Medicine

## 2020-01-23 ENCOUNTER — Other Ambulatory Visit: Payer: Self-pay

## 2020-01-23 ENCOUNTER — Ambulatory Visit
Admission: RE | Admit: 2020-01-23 | Discharge: 2020-01-23 | Disposition: A | Discharge status: Home / Self Care | Payer: Medicare PPO | Source: Ambulatory Visit | Attending: Adult Health Nurse Practitioner | Admitting: Adult Health Nurse Practitioner

## 2020-01-23 DIAGNOSIS — M810 Age-related osteoporosis without current pathological fracture: Secondary | ICD-10-CM | POA: Diagnosis not present

## 2020-01-23 DIAGNOSIS — M8589 Other specified disorders of bone density and structure, multiple sites: Secondary | ICD-10-CM

## 2020-01-23 DIAGNOSIS — E2839 Other primary ovarian failure: Secondary | ICD-10-CM

## 2020-01-23 DIAGNOSIS — M85852 Other specified disorders of bone density and structure, left thigh: Secondary | ICD-10-CM | POA: Diagnosis not present

## 2020-01-23 DIAGNOSIS — Z780 Asymptomatic menopausal state: Secondary | ICD-10-CM | POA: Diagnosis not present

## 2020-01-29 ENCOUNTER — Encounter: Payer: Self-pay | Admitting: Internal Medicine

## 2020-01-29 NOTE — Patient Instructions (Signed)

## 2020-01-29 NOTE — Progress Notes (Signed)
History of Present Illness:       This very nice 74 y.o.  MWF presents for a 6 month follow up with HTN, HLD, Pre-Diabetes and Vitamin D Deficiency.  Patient as hx/o Aortic atherosclerosis by CT scan. Patient also has hx/o migraines. Recent BMD showed Osteoporosis with T-3.1 of the forearm.      Patient has been followed for labile HTN (2010) & BP has been controlled at home. Today's BP: 110/72. Patient has had no complaints of any cardiac type chest pain, palpitations, dyspnea / orthopnea / PND, dizziness, claudication, or dependent edema.      Hyperlipidemia is controlled with diet & Rosuvastatin /Ezetimibe. Patient denies myalgias or other med SE's. Last Lipids were at goal:  Lab Results  Component Value Date   CHOL 153 10/02/2019   HDL 64 10/02/2019   LDLCALC 70 10/02/2019   TRIG 107 10/02/2019   CHOLHDL 2.4 10/02/2019    Also, the patient has history of PreDiabetes (A1c 6.1% /2011) and has had no symptoms of reactive hypoglycemia, diabetic polys, paresthesias or visual blurring.  Last A1c was at goal:  Lab Results  Component Value Date   HGBA1C 5.7 (H) 06/24/2019       Further, the patient also has history of Vitamin D Deficiency ("18" /2008)and supplements vitamin D without any suspected side-effects. Last vitamin D was at goal:  Lab Results  Component Value Date   VD25OH 79 06/24/2019    Current Outpatient Medications on File Prior to Visit  Medication Sig  . ALPRAZolam  0.5 MG tablet Take 1/2 - 1 tablet   2 - 3 x /day ONLY if needed   . VITAMIN C 1000 MG tablet Take  daily.  Marland Kitchen aspirin EC 81 MG tablet Take  daily.  . ASTELIN  nasal spray Place 2 sprays into both nostrils 2 (two) times daily  . b complex vitamins cap Take 1 capsule  daily.  Marland Kitchen VITAMIN D 5000 UT CAPS Take 5,000 Units  daily.  Marland Kitchen ezetimibe  10 MG tablet TAKE 1 TABLET DAILY   . FLONASE  nasal spray Place 2 sprays into both nostrils daily as needed for allergies.  . Multiple Vitamins-Minerals  Take  1 tablet  daily.  . rosuvastatin 20 MG tablet TAKE 1 TABLET  DAILY   . IMITREX 20 MG/ACT nasal spray USE ONE DOSE IN THE NOSE NOW, MAY REPEAT IN 2 HRS. MAX OF 2 DOSES IN 24 HOURS.   Marland Kitchen zinc  50 MG tablet Take 50 mg by mouth daily.     Allergies  Allergen Reactions  . Banana Nausea And Vomiting  . Chocolate Other (See Comments)    Unknown  . Effexor [Venlafaxine] Other (See Comments)    insomnia  . Maxalt [Rizatriptan Benzoate] Nausea Only and Other (See Comments)    Nausea and excessive salivation  . Other Other (See Comments)    NUTS (all kinds)  . Tussin [Guaifenesin] Rash  . Zostavax [Zoster Vaccine Live] Itching and Rash    PMHx:   Past Medical History:  Diagnosis Date  . Allergy   . Anemia   . Anxiety   . Aortic atherosclerosis ([place]) 11/2016   Noted on CT abd  . Arthritis    hands and knees  . Bilateral cataracts    History of  . Cellulitis    Right finger  . Cerebral amyloid angiopathy (CODE)   . Cervical spondylosis 12/2008   Noted on Cervical spine  .  Chronic nausea   . Compression fracture of T12 vertebra (HCC) 06/07/2017   Mild  . DDD (degenerative disc disease), cervical 12/2008   Noted on Cervical spine  . Delusions ([place]) 03/24/2018  . Depression   . Encephalopathy 01/02/2017   Mild, [facility]  . GERD (gastroesophageal reflux disease)   . Helicobacter pylori infection 2018  . History of appendicitis    age 57  . History of colon polyps   . History of confusion   . History of hiatal hernia 10/16/2016   Noted on Endoscopy  . Hyperlipidemia   . Incontinence in female 08/06/2014  . Internal hemorrhoid 08/02/2017   Noted on Colonoscopy  . Low blood pressure reading   . Migraines   . Osteopenia   . PONV (postoperative nausea and vomiting)   . Pre-diabetes   . RLS (restless legs syndrome)   . Seizure ([place])    atypical partial comples vs. confusional migraines  . Seizure disorder ([place]) 12/15/2016  . Skin cancer    right lower eyelid  . Squamous  cell skin cancer    history of neck and upper back  . Syncope 03/25/2018  . TIA (transient ischemic attack)    questionable per patient  . Vitamin D deficiency     Immunization History  Administered Date(s) Administered  . Influenza, High Dose Seasonal PF 01/01/2014, 03/15/2015, 12/13/2015, 01/03/2017, 02/05/2018  . Influenza-Unspecified 12/26/2012  . PFIZER SARS-COV-2 Vaccination 05/15/2019, 06/12/2019  . Pneumococcal Conjugate-13 08/06/2014  . Pneumococcal-Unspecified 12/26/2012  . Td 12/26/2012  . Tdap 05/08/2017  . Zoster 12/08/2010    Past Surgical History:  Procedure Laterality Date  . APPENDECTOMY  1959  . CATARACT EXTRACTION, BILATERAL Bilateral 2014  . [place]  . COLONOSCOPY W/ POLYPECTOMY  08/02/2017  . KNEE ARTHROSCOPY Bilateral 04/2014   [facility]  . MOHS SURGERY Right 04/2013   Squamous cell cacinoma, neck/upper back  . SPINE SURGERY  2010   C6-C7 fusion  . TOTAL KNEE ARTHROPLASTY Left 03/22/2018   Procedure: LEFT TOTAL KNEE ARTHROPLASTY;  Surgeon: Dorna Leitz, MD;  Location: [facility];  Service: Orthopedics;  Laterality: Left;  Adductor Block  . UPPER GI ENDOSCOPY  10/16/2016    FHx:    Reviewed / unchanged  SHx:    Reviewed / unchanged   Systems Review:  Constitutional: Denies fever, chills, wt changes, headaches, insomnia, fatigue, night sweats, change in appetite. Eyes: Denies redness, blurred vision, diplopia, discharge, itchy, watery eyes.  ENT: Denies discharge, congestion, post nasal drip, epistaxis, sore throat, earache, hearing loss, dental pain, tinnitus, vertigo, sinus pain, snoring.  CV: Denies chest pain, palpitations, irregular heartbeat, syncope, dyspnea, diaphoresis, orthopnea, PND, claudication or edema. Respiratory: denies cough, dyspnea, DOE, pleurisy, hoarseness, laryngitis, wheezing.  Gastrointestinal: Denies dysphagia, odynophagia, heartburn, reflux, water brash, abdominal pain or cramps, nausea, vomiting, bloating,  diarrhea, constipation, hematemesis, melena, hematochezia  or hemorrhoids. Genitourinary: Denies dysuria, frequency, urgency, nocturia, hesitancy, discharge, hematuria or flank pain. Musculoskeletal: Denies arthralgias, myalgias, stiffness, jt. swelling, pain, limping or strain/sprain.  Skin: Denies pruritus, rash, hives, warts, acne, eczema or change in skin lesion(s). Neuro: No weakness, tremor, incoordination, spasms, paresthesia or pain. Psychiatric: Denies confusion, memory loss or sensory loss. Endo: Denies change in weight, skin or hair change.  Heme/Lymph: No excessive bleeding, bruising or enlarged lymph nodes.  Physical Exam  BP 110/72   Pulse 79   Temp 97.8 F (36.6 C)   Resp 16   Ht 5\' 6"  (1.676 m)   Wt 138 lb  6.4 oz (62.8 kg)   SpO2 95%   BMI 22.34 kg/m   Appears  well nourished, well groomed  and in no distress.  Eyes: PERRLA, EOMs, conjunctiva no swelling or erythema. Sinuses: No frontal/maxillary tenderness ENT/Mouth: EAC's clear, TM's nl w/o erythema, bulging. Nares clear w/o erythema, swelling, exudates. Oropharynx clear without erythema or exudates. Oral hygiene is good. Tongue normal, non obstructing. Hearing intact.  Neck: Supple. Thyroid not palpable. Car 2+/2+ without bruits, nodes or JVD. Chest: Respirations nl with BS clear & equal w/o rales, rhonchi, wheezing or stridor.  Cor: Heart sounds normal w/ regular rate and rhythm without sig. murmurs, gallops, clicks or rubs. Peripheral pulses normal and equal  without edema.  Abdomen: Soft & bowel sounds normal. Non-tender w/o guarding, rebound, hernias, masses or organomegaly.  Lymphatics: Unremarkable.  Musculoskeletal: Full ROM all peripheral extremities, joint stability, 5/5 strength and normal gait.  Skin: Warm, dry without exposed rashes, lesions or ecchymosis apparent.  Neuro: Cranial nerves intact, reflexes equal bilaterally. Sensory-motor testing grossly intact. Tendon reflexes grossly intact.  Pysch:  Alert & oriented x 3.  Insight and judgement nl & appropriate. No ideations.  Assessment and Plan:  1. Labile hypertension  - Continue medication, monitor blood pressure at home.  - Continue DASH diet.  Reminder to go to the ER if any CP,  SOB, nausea, dizziness, severe HA, changes vision/speech.  - CBC with Differential/Platelet - COMPLETE METABOLIC PANEL WITH GFR - Magnesium - TSH  2. Hyperlipidemia, mixed  - Continue diet/meds, exercise,& lifestyle modifications.  - Continue monitor periodic cholesterol/liver & renal functions   - Lipid panel - TSH  3. Abnormal glucose  - Continue diet, exercise  - Lifestyle modifications.  - Monitor appropriate labs.  - Hemoglobin A1c - Insulin, random  4. Vitamin D deficiency  - Continue supplementation.  - VITAMIN D 25 Hydroxy   5. Aortic atherosclerosis (HCC)  - Lipid panel  6. Migraine    7. Medication management  - CBC with Differential/Platelet - COMPLETE METABOLIC PANEL WITH GFR - Magnesium - Lipid panel - TSH - Hemoglobin A1c - Insulin, random - VITAMIN D 25 Hydrox         Discussed  regular exercise, BP monitoring, weight control to achieve/maintain BMI less than 25 and discussed med and SE's. Recommended labs to assess and monitor clinical status with further disposition pending results of labs.  I discussed the assessment and treatment plan with the patient. The patient was provided an opportunity to ask questions and all were answered. The patient agreed with the plan and demonstrated an understanding of the instructions.  I provided over 30 minutes of exam, counseling, chart review and  complex critical decision making.         The patient was agreeable to start Fosamax for her Osteoporosis & was advised of effects & Side-effects.   Kirtland Bouchard, MD

## 2020-01-30 ENCOUNTER — Other Ambulatory Visit: Payer: Self-pay

## 2020-01-30 ENCOUNTER — Ambulatory Visit: Payer: Medicare PPO | Admitting: Internal Medicine

## 2020-01-30 VITALS — BP 110/72 | HR 79 | Temp 97.8°F | Resp 16 | Ht 66.0 in | Wt 138.4 lb

## 2020-01-30 DIAGNOSIS — I700 Atherosclerosis of aorta: Secondary | ICD-10-CM

## 2020-01-30 DIAGNOSIS — G43009 Migraine without aura, not intractable, without status migrainosus: Secondary | ICD-10-CM | POA: Diagnosis not present

## 2020-01-30 DIAGNOSIS — E782 Mixed hyperlipidemia: Secondary | ICD-10-CM | POA: Diagnosis not present

## 2020-01-30 DIAGNOSIS — M810 Age-related osteoporosis without current pathological fracture: Secondary | ICD-10-CM

## 2020-01-30 DIAGNOSIS — Z79899 Other long term (current) drug therapy: Secondary | ICD-10-CM

## 2020-01-30 DIAGNOSIS — R7309 Other abnormal glucose: Secondary | ICD-10-CM | POA: Diagnosis not present

## 2020-01-30 DIAGNOSIS — E559 Vitamin D deficiency, unspecified: Secondary | ICD-10-CM

## 2020-01-30 DIAGNOSIS — R0989 Other specified symptoms and signs involving the circulatory and respiratory systems: Secondary | ICD-10-CM | POA: Diagnosis not present

## 2020-01-30 DIAGNOSIS — Z23 Encounter for immunization: Secondary | ICD-10-CM

## 2020-01-31 MED ORDER — ALENDRONATE SODIUM 70 MG PO TABS: ORAL_TABLET | ORAL | 0 refills | Status: DC

## 2020-01-31 NOTE — Progress Notes (Signed)
========================================================== -   Test results slightly outside the reference range are not unusual. If there is anything important, I will review this with you,  otherwise it is considered normal test values.  If you have further questions,  please do not hesitate to contact me at the office or via My Chart.  ==========================================================  -  Total Chol = 158 and LDL Chol = 66 -Both  Excellent   - Very low risk for Heart Attack  / Stroke ========================================================== - A1c = 5.7% - Still Borderline elevated Sugar   - Avoid Sweets, Candy & White Stuff   - Rice, Potatoes, Breads &  Pasta ==========================================================  -  Vitamin D = 77 - Excellent  ==========================================================  -  All Else - CBC - Kidneys - Electrolytes - Liver - Magnesium & Thyroid    - all  Normal / OK ====================================================   - Keep up the [place] Work  ! ==========================================================

## 2020-02-02 LAB — CBC WITH DIFFERENTIAL/PLATELET
Absolute Monocytes: 787 cells/uL (ref 200–950)
Basophils Absolute: 83 cells/uL (ref 0–200)
Basophils Relative: 1.2 %
Eosinophils Absolute: 173 cells/uL (ref 15–500)
Eosinophils Relative: 2.5 %
HCT: 37.9 % (ref 35.0–45.0)
Hemoglobin: 12.5 g/dL (ref 11.7–15.5)
Lymphs Abs: 1973 cells/uL (ref 850–3900)
MCH: 30.5 pg (ref 27.0–33.0)
MCHC: 33 g/dL (ref 32.0–36.0)
MCV: 92.4 fL (ref 80.0–100.0)
MPV: 9.9 fL (ref 7.5–12.5)
Monocytes Relative: 11.4 %
Neutro Abs: 3885 cells/uL (ref 1500–7800)
Neutrophils Relative %: 56.3 %
Platelets: 316 10*3/uL (ref 140–400)
RBC: 4.1 10*6/uL (ref 3.80–5.10)
RDW: 11.6 % (ref 11.0–15.0)
Total Lymphocyte: 28.6 %
WBC: 6.9 10*3/uL (ref 3.8–10.8)

## 2020-02-02 LAB — TSH: TSH: 2.76 mIU/L (ref 0.40–4.50)

## 2020-02-02 LAB — HEMOGLOBIN A1C
Hgb A1c MFr Bld: 5.7 % of total Hgb — ABNORMAL HIGH (ref <5.7)
Mean Plasma Glucose: 117 (calc)
eAG (mmol/L): 6.5 (calc)

## 2020-02-02 LAB — COMPLETE METABOLIC PANEL WITH GFR
AG Ratio: 1.6 (calc) (ref 1.0–2.5)
ALT: 15 U/L (ref 6–29)
AST: 23 U/L (ref 10–35)
Albumin: 4.1 g/dL (ref 3.6–5.1)
Alkaline phosphatase (APISO): 74 U/L (ref 37–153)
BUN: 14 mg/dL (ref 7–25)
CO2: 28 mmol/L (ref 20–32)
Calcium: 10.1 mg/dL (ref 8.6–10.4)
Chloride: 105 mmol/L (ref 98–110)
Creat: 0.88 mg/dL (ref 0.60–0.93)
GFR, Est African American: 76 mL/min/{1.73_m2} (ref >=60)
GFR, Est Non African American: 65 mL/min/{1.73_m2} (ref >=60)
Globulin: 2.5 g/dL (calc) (ref 1.9–3.7)
Glucose, Bld: 94 mg/dL (ref 65–99)
Potassium: 4.8 mmol/L (ref 3.5–5.3)
Sodium: 139 mmol/L (ref 135–146)
Total Bilirubin: 0.4 mg/dL (ref 0.2–1.2)
Total Protein: 6.6 g/dL (ref 6.1–8.1)

## 2020-02-02 LAB — MAGNESIUM: Magnesium: 2.3 mg/dL (ref 1.5–2.5)

## 2020-02-02 LAB — VITAMIN D 25 HYDROXY (VIT D DEFICIENCY, FRACTURES): Vit D, 25-Hydroxy: 77 ng/mL (ref 30–100)

## 2020-02-02 LAB — LIPID PANEL
Cholesterol: 158 mg/dL (ref <200)
HDL: 71 mg/dL (ref >=50)
LDL Cholesterol (Calc): 66 mg/dL (calc)
Non-HDL Cholesterol (Calc): 87 mg/dL (calc) (ref <130)
Total CHOL/HDL Ratio: 2.2 (calc) (ref <5.0)
Triglycerides: 119 mg/dL (ref <150)

## 2020-02-02 LAB — INSULIN, RANDOM: Insulin: 8.2 u[IU]/mL

## 2020-02-09 ENCOUNTER — Other Ambulatory Visit: Payer: Self-pay | Admitting: Adult Health

## 2020-02-09 DIAGNOSIS — G43009 Migraine without aura, not intractable, without status migrainosus: Secondary | ICD-10-CM

## 2020-02-13 ENCOUNTER — Ambulatory Visit: Payer: Medicare PPO | Admitting: Adult Health

## 2020-02-13 ENCOUNTER — Encounter: Payer: Self-pay | Admitting: Adult Health

## 2020-02-13 ENCOUNTER — Other Ambulatory Visit: Payer: Self-pay

## 2020-02-13 VITALS — BP 102/64 | HR 85 | Temp 97.2°F | Wt 140.0 lb

## 2020-02-13 DIAGNOSIS — R300 Dysuria: Secondary | ICD-10-CM | POA: Diagnosis not present

## 2020-02-13 DIAGNOSIS — N390 Urinary tract infection, site not specified: Secondary | ICD-10-CM

## 2020-02-13 DIAGNOSIS — R319 Hematuria, unspecified: Secondary | ICD-10-CM | POA: Diagnosis not present

## 2020-02-13 MED ORDER — NITROFURANTOIN MONOHYD MACRO 100 MG PO CAPS: 100.0000 mg | ORAL_CAPSULE | Freq: Two times a day (BID) | ORAL | 0 refills | Status: DC

## 2020-02-13 NOTE — Patient Instructions (Addendum)
Dr. Liliane Channel phone number - [phone]      Nitrofurantoin tablets or capsules What is this medicine? NITROFURANTOIN (nye troe fyoor AN toyn) is an antibiotic. It is used to treat urinary tract infections. This medicine may be used for other purposes; ask your health care provider or pharmacist if you have questions. COMMON BRAND NAME(S): Macrobid, Macrodantin, Urotoin What should I tell my health care provider before I take this medicine? They need to know if you have any of these conditions:  anemia  diabetes  glucose-6-phosphate dehydrogenase deficiency  kidney disease  liver disease  lung disease  other chronic illness  an unusual or allergic reaction to nitrofurantoin, other antibiotics, other medicines, foods, dyes or preservatives  pregnant or trying to get pregnant  breast-feeding How should I use this medicine? Take this medicine by mouth with a glass of water. Follow the directions on the prescription label. Take this medicine with food or milk. Take your doses at regular intervals. Do not take your medicine more often than directed. Do not stop taking except on your doctor's advice. Talk to your pediatrician regarding the use of this medicine in children. While this drug may be prescribed for selected conditions, precautions do apply. Overdosage: If you think you have taken too much of this medicine contact a poison control center or emergency room at once. NOTE: This medicine is only for you. Do not share this medicine with others. What if I miss a dose? If you miss a dose, take it as soon as you can. If it is almost time for your next dose, take only that dose. Do not take double or extra doses. What may interact with this medicine?  antacids containing magnesium trisilicate  probenecid  quinolone antibiotics like ciprofloxacin, lomefloxacin, norfloxacin and ofloxacin  sulfinpyrazone This list may not describe all possible interactions. Give your health  care provider a list of all the medicines, herbs, non-prescription drugs, or dietary supplements you use. Also tell them if you smoke, drink alcohol, or use illegal drugs. Some items may interact with your medicine. What should I watch for while using this medicine? Tell your doctor or health care professional if your symptoms do not improve or if you get new symptoms. Drink several glasses of water a day. If you are taking this medicine for a long time, visit your doctor for regular checks on your progress. If you are diabetic, you may get a false positive result for sugar in your urine with certain brands of urine tests. Check with your doctor. What side effects may I notice from receiving this medicine? Side effects that you should report to your doctor or health care professional as soon as possible:  allergic reactions like skin rash or hives, swelling of the face, lips, or tongue  chest pain  cough  difficulty breathing  dizziness, drowsiness  fever or infection  joint aches or pains  pale or blue-tinted skin  redness, blistering, peeling or loosening of the skin, including inside the mouth  tingling, burning, pain, or numbness in hands or feet  unusual bleeding or bruising  unusually weak or tired  yellowing of eyes or skin Side effects that usually do not require medical attention (report to your doctor or health care professional if they continue or are bothersome):  dark urine  diarrhea  headache  loss of appetite  nausea or vomiting  temporary hair loss This list may not describe all possible side effects. Call your doctor for medical advice about side effects.  You may report side effects to FDA at 1-800-FDA-1088. Where should I keep my medicine? Keep out of the reach of children. Store at room temperature between 15 and 30 degrees C (59 and 86 degrees F). Protect from light. Throw away any unused medicine after the expiration date. NOTE: This sheet is a  summary. It may not cover all possible information. If you have questions about this medicine, talk to your doctor, pharmacist, or health care provider.  2020 Elsevier/Gold Standard (2007-10-23 15:56:47)

## 2020-02-13 NOTE — Progress Notes (Signed)
Assessment and Plan:  Deborah [name] was seen today for urinary tract infection.  Diagnoses and all orders for this visit:  Dysuria/UTI Presumptive treatment for classic sx Medications: nitrofurantoin. Maintain adequate hydration. Follow up if symptoms not improving, and as needed. -     Urinalysis w microscopic + reflex cultur -     nitrofurantoin, macrocrystal-monohydrate, (MACROBID) 100 MG capsule; Take 1 capsule (100 mg total) by mouth 2 (two) times daily.  Further disposition pending results of labs. Discussed med's effects and SE's.   Over 15 minutes of exam, counseling, chart review, and critical decision making was performed.   Future Appointments  Date Time Provider [place]  05/06/2020 11:00 AM Garnet Sierras, NP [facility] None  09/06/2020  2:00 PM Unk Pinto, MD [facility] None  10/05/2020 10:00 AM McClanahan, Danton Sewer, NP [facility] None    ------------------------------------------------------------------------------------------------------------------   HPI BP 102/64   Pulse 85   Temp (!) 97.2 F (36.2 C)   Wt 140 lb (63.5 kg)   SpO2 96%   BMI 22.60 kg/m   74 y.o.female presents for evaluation of possible UTI.   She reports 4 days ago started having urgency/frequency, dysuria, with mild cloudiness. Denies fever/chills. Does endorse mild lower back aching, denies flank pain or hx of stones. Mild intermittent pink tinge to urine.  Denies abdominal pain, n/v/d.   No vaginal discharge, Denies hx of STIs, no new partners.  Has had previously with similar sx, none recent, no recent abx.     Past Medical History:  Diagnosis Date  . Allergy   . Anemia   . Anxiety   . Aortic atherosclerosis ([place]) 11/2016   Noted on CT abd  . Arthritis    hands and knees  . Bilateral cataracts    History of  . Cellulitis    Right finger  . Cerebral amyloid angiopathy (CODE)   . Cervical spondylosis 12/2008   Noted on Cervical spine  . Chronic nausea   .  Compression fracture of T12 vertebra (HCC) 06/07/2017   Mild  . DDD (degenerative disc disease), cervical 12/2008   Noted on Cervical spine  . Delusions ([place]) 03/24/2018  . Depression   . Encephalopathy 01/02/2017   Mild, [facility]  . GERD (gastroesophageal reflux disease)   . Helicobacter pylori infection 2018  . History of appendicitis    age 58  . History of colon polyps   . History of confusion   . History of hiatal hernia 10/16/2016   Noted on Endoscopy  . Hyperlipidemia   . Incontinence in female 08/06/2014  . Internal hemorrhoid 08/02/2017   Noted on Colonoscopy  . Low blood pressure reading   . Migraines   . Osteopenia   . PONV (postoperative nausea and vomiting)   . Pre-diabetes   . RLS (restless legs syndrome)   . Seizure ([place])    atypical partial comples vs. confusional migraines  . Seizure disorder ([place]) 12/15/2016  . Skin cancer    right lower eyelid  . Squamous cell skin cancer    history of neck and upper back  . Syncope 03/25/2018  . TIA (transient ischemic attack)    questionable per patient  . Vitamin D deficiency      Allergies  Allergen Reactions  . Banana Nausea And Vomiting  . Chocolate Other (See Comments)    Unknown  . Effexor [Venlafaxine] Other (See Comments)    insomnia  . Maxalt [Rizatriptan Benzoate] Nausea Only and Other (See Comments)    Nausea and excessive  salivation  . Other Other (See Comments)    NUTS (all kinds)  . Tussin [Guaifenesin] Rash  . Zostavax [Zoster Vaccine Live] Itching and Rash    Current Outpatient Medications on File Prior to Visit  Medication Sig  . alendronate (FOSAMAX) 70 MG tablet Take      1 tablet      every 7 days       with a full glass of water       on an empty stomach       for 60 minutes  . ALPRAZolam (XANAX) 0.5 MG tablet Take 1/2 - 1 tablet   2 - 3 x /day ONLY if needed for Anxiety Attack &  limit to 5 days /week to avoid addiction  . Ascorbic Acid (VITAMIN C) 1000 MG tablet Take 1,000 mg by  mouth daily.  Marland Kitchen aspirin EC 81 MG tablet Take 81 mg by mouth daily.  Marland Kitchen azelastine (ASTELIN) 0.1 % nasal spray Place 2 sprays into both nostrils 2 (two) times daily. Use in each nostril as directed  . b complex vitamins capsule Take 1 capsule by mouth daily.  . Cholecalciferol (VITAMIN D3) 125 MCG (5000 UT) CAPS Take 5,000 Units by mouth daily.  Marland Kitchen ezetimibe (ZETIA) 10 MG tablet TAKE 1 TABLET ONCE DAILY FOR CHOLESTEROL  . fluticasone (FLONASE) 50 MCG/ACT nasal spray Place 2 sprays into both nostrils daily as needed for allergies.  . Multiple Vitamins-Minerals (MULTIVITAMIN PO) Take 1 tablet by mouth daily.  . rosuvastatin (CRESTOR) 20 MG tablet TAKE 1 TABLET  ONCE DAILY FOR CHOLESTEROL  . SUMAtriptan (IMITREX) 20 MG/ACT nasal spray USE ONE DOSE IN THE NOSE NOW, MAY REPEAT IN 2 HOURS. MAX OF 2 DOSES IN 24 HOURS. (Patient taking differently: Place 20 mg into the nose every 2 (two) hours as needed for migraine. )  . topiramate (TOPAMAX) 100 MG tablet Take     1 tablet     2 x /day      for Headache Prevention  . zinc gluconate 50 MG tablet Take 50 mg by mouth daily.   No current facility-administered medications on file prior to visit.    ROS: all negative except above.   Physical Exam:  BP 102/64   Pulse 85   Temp (!) 97.2 F (36.2 C)   Wt 140 lb (63.5 kg)   SpO2 96%   BMI 22.60 kg/m   General Appearance: Well nourished, in no apparent distress. Eyes:  conjunctiva no swelling or erythema ENT/Mouth: mask in place; Hearing normal.  Neck: Supple Respiratory: Respiratory effort normal Cardio: Appears well perfused  Abdomen: Soft, + BS.  Non tender, no guarding, rebound, hernias, masses. Lymphatics: Non tender without lymphadenopathy.  Musculoskeletal: normal gait. No CVA tenderness.  Skin: Warm, dry without rashes, lesions, ecchymosis.  Neuro: Normal muscle tone Psych: Awake and oriented X 3, normal affect, Insight and Judgment appropriate.     Izora Ribas, NP 11:21  AM [facility]

## 2020-02-16 LAB — URINALYSIS W MICROSCOPIC + REFLEX CULTURE
Bilirubin Urine: NEGATIVE
Glucose, UA: NEGATIVE
Hyaline Cast: NONE SEEN /LPF
Ketones, ur: NEGATIVE
Nitrites, Initial: NEGATIVE
Specific Gravity, Urine: 1.012 (ref 1.001–1.03)
Squamous Epithelial / HPF: NONE SEEN /HPF (ref <=5)
WBC, UA: >=60 /HPF — AB (ref 0–5)
pH: 5.5 (ref 5.0–8.0)

## 2020-02-16 LAB — URINE CULTURE
MICRO NUMBER:: 11140350
SPECIMEN QUALITY:: ADEQUATE

## 2020-02-16 LAB — CULTURE INDICATED

## 2020-03-20 ENCOUNTER — Other Ambulatory Visit: Payer: Self-pay | Admitting: Internal Medicine

## 2020-03-20 DIAGNOSIS — E782 Mixed hyperlipidemia: Secondary | ICD-10-CM

## 2020-04-12 ENCOUNTER — Other Ambulatory Visit: Payer: Self-pay | Admitting: Internal Medicine

## 2020-04-12 MED ORDER — ALPRAZOLAM 0.5 MG PO TABS
ORAL_TABLET | ORAL | 0 refills | Status: DC
Start: 2020-04-12 — End: 2020-06-01

## 2020-04-15 ENCOUNTER — Ambulatory Visit: Payer: Medicare PPO | Admitting: Adult Health Nurse Practitioner

## 2020-04-15 ENCOUNTER — Other Ambulatory Visit: Payer: Self-pay

## 2020-04-15 ENCOUNTER — Encounter: Payer: Self-pay | Admitting: Adult Health Nurse Practitioner

## 2020-04-15 VITALS — BP 108/66 | HR 81 | Temp 97.3°F | Ht 66.0 in | Wt 140.0 lb

## 2020-04-15 DIAGNOSIS — F411 Generalized anxiety disorder: Secondary | ICD-10-CM

## 2020-04-15 DIAGNOSIS — R0989 Other specified symptoms and signs involving the circulatory and respiratory systems: Secondary | ICD-10-CM

## 2020-04-15 MED ORDER — ESCITALOPRAM OXALATE 5 MG PO TABS: 10.0000 mg | ORAL_TABLET | Freq: Every day | ORAL | 1 refills | Status: DC

## 2020-04-15 NOTE — Patient Instructions (Signed)
° °  Pick up a new blood pressure cuff, that goes around your arm, from any pharmacy.  Check your blood pressure twice a day and write it down.   We are going to start Escitalopram, (Lexapro) 5mg .  Take this medication at night before bed.  We will follow up in 4 week.  Please contact the office with andy new or worsening symptoms.

## 2020-04-15 NOTE — Progress Notes (Signed)
Assessment and Plan:  Deborah [name] was seen today for hypertension.  Diagnoses and all orders for this visit:  Generalized anxiety disorder -     escitalopram (LEXAPRO) 5 MG tablet; Take 2 tablets (10 mg total) by mouth at bedtime. Continue Alprazolam 0.5mg , discussed taking whole tablet at bedtime to improve sleep at this time. Discussed stress management techniques   Discussed good sleep hygiene Discussed increasing physical activity and exercise Increase water intake Close follow up  Labile hypertension Get new blood pressure cuff. Check B/P twice a day and record No medications Monitor blood pressure at home; call if consistently over 130/80 Continue DASH diet.   Reminder to go to the ER if any CP, SOB, nausea, dizziness, severe HA, changes vision/speech, left arm numbness and tingling and jaw pain.    Contact office with any new or worsening symptoms     Further disposition pending results of labs. Discussed med's effects and SE's.   Over 30 minutes of face to face interview, exam, counseling, chart review, and critical decision making was performed.   Future Appointments  Date Time Provider [place]  05/06/2020 11:00 AM Garnet Sierras, NP [facility] None  09/06/2020  2:00 PM Unk Pinto, MD [facility] None  10/05/2020 10:00 AM Garnet Sierras, NP [facility] None    ------------------------------------------------------------------------------------------------------------------   HPI 74 y.o.female presents for evaluation of elevated blood pressure readings at home.  She reports that she take alprazolam.5mg , half tablet twice a day.   She reports it is helping some but not completely.  She reports that her anxiety has increased and stayed up high.  She reports she hosted Christmas brunch and also watching dog her her children.  Her husband hurt his leg so he has not been able to help with the house work. She does not recall being on medication in the past.   She has been on Depakote and lexapro.  She was on Depakote while she was managing severe headaches rule out seizure type activity.  She has tried sertraline with adverse side effects.  She was on escitalopram, low does.  Does not recall why she stopped taking this.  With chart review it appears her mood improved and did trial off the medication.  She had been doing well until recently.   Past Medical History:  Diagnosis Date  . Allergy   . Anemia   . Anxiety   . Aortic atherosclerosis ([place]) 11/2016   Noted on CT abd  . Arthritis    hands and knees  . Bilateral cataracts    History of  . Cellulitis    Right finger  . Cerebral amyloid angiopathy (CODE)   . Cervical spondylosis 12/2008   Noted on Cervical spine  . Chronic nausea   . Compression fracture of T12 vertebra (HCC) 06/07/2017   Mild  . DDD (degenerative disc disease), cervical 12/2008   Noted on Cervical spine  . Delusions ([place]) 03/24/2018  . Depression   . Encephalopathy 01/02/2017   Mild, [facility]  . GERD (gastroesophageal reflux disease)   . Helicobacter pylori infection 2018  . History of appendicitis    age 89  . History of colon polyps   . History of confusion   . History of hiatal hernia 10/16/2016   Noted on Endoscopy  . Hyperlipidemia   . Incontinence in female 08/06/2014  . Internal hemorrhoid 08/02/2017   Noted on Colonoscopy  . Low blood pressure reading   . Migraines   . Osteopenia   . PONV (postoperative  nausea and vomiting)   . Pre-diabetes   . RLS (restless legs syndrome)   . Seizure ([place])    atypical partial comples vs. confusional migraines  . Seizure disorder ([place]) 12/15/2016  . Skin cancer    right lower eyelid  . Squamous cell skin cancer    history of neck and upper back  . Syncope 03/25/2018  . TIA (transient ischemic attack)    questionable per patient  . Vitamin D deficiency      Allergies  Allergen Reactions  . Banana Nausea And Vomiting  . Chocolate Other (See Comments)     Unknown  . Effexor [Venlafaxine] Other (See Comments)    insomnia  . Maxalt [Rizatriptan Benzoate] Nausea Only and Other (See Comments)    Nausea and excessive salivation  . Other Other (See Comments)    NUTS (all kinds)  . Tussin [Guaifenesin] Rash  . Zostavax [Zoster Vaccine Live] Itching and Rash    Current Outpatient Medications on File Prior to Visit  Medication Sig  . alendronate (FOSAMAX) 70 MG tablet Take      1 tablet      every 7 days       with a full glass of water       on an empty stomach       for 60 minutes  . ALPRAZolam (XANAX) 0.5 MG tablet Take 1/2 - 1 tablet   2 - 3 x /day ONLY if needed for Anxiety Attack &  limit to 5 days /week to avoid addiction  . Ascorbic Acid (VITAMIN C) 1000 MG tablet Take 1,000 mg by mouth daily.  Marland Kitchen aspirin EC 81 MG tablet Take 81 mg by mouth daily.  Marland Kitchen b complex vitamins capsule Take 1 capsule by mouth daily.  . Cholecalciferol (VITAMIN D3) 125 MCG (5000 UT) CAPS Take 5,000 Units by mouth daily.  Marland Kitchen ezetimibe (ZETIA) 10 MG tablet Take     1 tablet     Daily      for Cholesterol  . fluticasone (FLONASE) 50 MCG/ACT nasal spray Place 2 sprays into both nostrils daily as needed for allergies.  . Multiple Vitamins-Minerals (MULTIVITAMIN PO) Take 1 tablet by mouth daily.  . rosuvastatin (CRESTOR) 20 MG tablet Take     1 tablet     Daily      for Cholesterol  . SUMAtriptan (IMITREX) 20 MG/ACT nasal spray USE ONE DOSE IN THE NOSE NOW, MAY REPEAT IN 2 HOURS. MAX OF 2 DOSES IN 24 HOURS. (Patient taking differently: Place 20 mg into the nose every 2 (two) hours as needed for migraine.)  . topiramate (TOPAMAX) 100 MG tablet Take     1 tablet     2 x /day      for Headache Prevention  . zinc gluconate 50 MG tablet Take 50 mg by mouth daily.  Marland Kitchen azelastine (ASTELIN) 0.1 % nasal spray Place 2 sprays into both nostrils 2 (two) times daily. Use in each nostril as directed (Patient not taking: Reported on 04/15/2020)  . nitrofurantoin,  macrocrystal-monohydrate, (MACROBID) 100 MG capsule Take 1 capsule (100 mg total) by mouth 2 (two) times daily.   No current facility-administered medications on file prior to visit.    ROS: all negative except above.   Physical Exam:  BP 108/66   Pulse 81   Temp (!) 97.3 F (36.3 C)   Ht 5\' 6"  (1.676 m)   Wt 140 lb (63.5 kg)   SpO2 95%   BMI 22.60  kg/m   General Appearance: Well nourished, in no apparent distress. Eyes: PERRLA, EOMs, conjunctiva no swelling or erythema Sinuses: No Frontal/maxillary tenderness ENT/Mouth: Ext aud canals clear, TMs without erythema, bulging. No erythema, swelling, or exudate on post pharynx.  Tonsils not swollen or erythematous. Hearing normal.  Neck: Supple, thyroid normal.  Respiratory: Respiratory effort normal, BS equal bilaterally without rales, rhonchi, wheezing or stridor.  Cardio: RRR with no MRGs. Brisk peripheral pulses without edema.  Abdomen: Soft, + BS.  Non tender, no guarding, rebound, hernias, masses. Lymphatics: Non tender without lymphadenopathy.  Musculoskeletal: Full ROM, 5/5 strength, normal gait.  Skin: Warm, dry without rashes, lesions, ecchymosis.  Neuro: Cranial nerves intact. Normal muscle tone, no cerebellar symptoms. Sensation intact.  Psych: Awake and oriented X 3, normal affect, Insight and Judgment appropriate.    Elder Negus, Edrick Oh, DNP [facility] 04/15/2020  2:27 PM

## 2020-05-06 ENCOUNTER — Ambulatory Visit: Payer: Medicare PPO | Admitting: Adult Health Nurse Practitioner

## 2020-05-18 ENCOUNTER — Ambulatory Visit: Payer: Medicare PPO | Admitting: Adult Health Nurse Practitioner

## 2020-06-01 ENCOUNTER — Other Ambulatory Visit: Payer: Self-pay | Admitting: Internal Medicine

## 2020-06-01 MED ORDER — ALPRAZOLAM 0.5 MG PO TABS
ORAL_TABLET | ORAL | 0 refills | Status: DC
Start: 2020-06-01 — End: 2020-09-13

## 2020-06-14 ENCOUNTER — Ambulatory Visit: Payer: Medicare PPO | Admitting: Adult Health Nurse Practitioner

## 2020-06-14 ENCOUNTER — Encounter: Payer: Self-pay | Admitting: Adult Health Nurse Practitioner

## 2020-06-14 ENCOUNTER — Other Ambulatory Visit: Payer: Self-pay

## 2020-06-14 VITALS — BP 120/80 | HR 81 | Temp 98.1°F | Wt 139.0 lb

## 2020-06-14 DIAGNOSIS — R0989 Other specified symptoms and signs involving the circulatory and respiratory systems: Secondary | ICD-10-CM

## 2020-06-14 DIAGNOSIS — Z79899 Other long term (current) drug therapy: Secondary | ICD-10-CM

## 2020-06-14 DIAGNOSIS — F411 Generalized anxiety disorder: Secondary | ICD-10-CM

## 2020-06-14 MED ORDER — ESCITALOPRAM OXALATE 10 MG PO TABS: 10.0000 mg | ORAL_TABLET | Freq: Every day | ORAL | 2 refills | Status: DC

## 2020-06-14 MED ORDER — BUSPIRONE HCL 10 MG PO TABS: 10.0000 mg | ORAL_TABLET | Freq: Three times a day (TID) | ORAL | 1 refills | Status: DC

## 2020-06-14 NOTE — Progress Notes (Signed)
ACUTE  Assessment and Plan:  Deborah [name] was seen today for acute visit.  Diagnoses and all orders for this visit:  Generalized anxiety disorder -     escitalopram (LEXAPRO) 10 MG tablet; Take 1 tablet (10 mg total) by mouth daily. -     busPIRone (BUSPAR) 10 MG tablet; Take 1 tablet (10 mg total) by mouth 3 (three) times daily. Discussed counseling services in addition to medications Patient is open to this.   Labile hypertension Has been checking at home Well controlled  Medication management Continued    Further disposition pending results of labs. Discussed med's effects and SE's.   Over 30 minutes of face to face interview, exam, counseling, chart review, and critical decision making was performed.   Future Appointments  Date Time Provider [place]  06/29/2020  3:30 PM Garnet Sierras, NP [facility] None  09/06/2020  2:00 PM Unk Pinto, MD [facility] None  10/05/2020 10:00 AM Garnet Sierras, NP [facility] None    ------------------------------------------------------------------------------------------------------------------   HPI 75 y.o.female presents for evaluation of abdominal pains.  She reports that it started five days ago with gradual onset.  She had pain cental abdomin that was constant.  Nothing made it better.  She reports eating made it worse.  She was having BM's daily.  She tried taking tums, that made her vomit.   Denies excessive flatulence, She reports her symptoms have completely resolved today.    She reports that has been having an increase in anxiety.  She is currently taking escitalopram 10mg  nightly.  She does have alprazolam that she typically uses at night as needed.  Discussed changing current medication vs adding.  Discussed adding Buspar 10mg  taper to TID.  If not effective, consider changing escitalpram?  Past Medical History:  Diagnosis Date  . Allergy   . Anemia   . Anxiety   . Aortic atherosclerosis ([place]) 11/2016   Noted  on CT abd  . Arthritis    hands and knees  . Bilateral cataracts    History of  . Cellulitis    Right finger  . Cerebral amyloid angiopathy (CODE)   . Cervical spondylosis 12/2008   Noted on Cervical spine  . Chronic nausea   . Compression fracture of T12 vertebra (HCC) 06/07/2017   Mild  . DDD (degenerative disc disease), cervical 12/2008   Noted on Cervical spine  . Delusions ([place]) 03/24/2018  . Depression   . Encephalopathy 01/02/2017   Mild, [facility]  . GERD (gastroesophageal reflux disease)   . Helicobacter pylori infection 2018  . History of appendicitis    age 75  . History of colon polyps   . History of confusion   . History of hiatal hernia 10/16/2016   Noted on Endoscopy  . Hyperlipidemia   . Incontinence in female 08/06/2014  . Internal hemorrhoid 08/02/2017   Noted on Colonoscopy  . Low blood pressure reading   . Migraines   . Osteopenia   . PONV (postoperative nausea and vomiting)   . Pre-diabetes   . RLS (restless legs syndrome)   . Seizure ([place])    atypical partial comples vs. confusional migraines  . Seizure disorder ([place]) 12/15/2016  . Skin cancer    right lower eyelid  . Squamous cell skin cancer    history of neck and upper back  . Syncope 03/25/2018  . TIA (transient ischemic attack)    questionable per patient  . Vitamin D deficiency      Allergies  Allergen Reactions  .  Banana Nausea And Vomiting  . Chocolate Other (See Comments)    Unknown  . Effexor [Venlafaxine] Other (See Comments)    insomnia  . Maxalt [Rizatriptan Benzoate] Nausea Only and Other (See Comments)    Nausea and excessive salivation  . Other Other (See Comments)    NUTS (all kinds)  . Tussin [Guaifenesin] Rash  . Zostavax [Zoster Vaccine Live] Itching and Rash    Current Outpatient Medications on File Prior to Visit  Medication Sig  . alendronate (FOSAMAX) 70 MG tablet Take      1 tablet      every 7 days       with a full glass of water       on an empty  stomach       for 60 minutes  . ALPRAZolam (XANAX) 0.5 MG tablet Take 1/2 -1 tablet 2-3 x /day  ONLY  if needed for Anxiety Attack &  limit to 5 days /week to avoid addiction  . Ascorbic Acid (VITAMIN C) 1000 MG tablet Take 1,000 mg by mouth daily.  Marland Kitchen aspirin EC 81 MG tablet Take 81 mg by mouth daily.  Marland Kitchen azelastine (ASTELIN) 0.1 % nasal spray Place 2 sprays into both nostrils 2 (two) times daily. Use in each nostril as directed  . b complex vitamins capsule Take 1 capsule by mouth daily.  . Cholecalciferol (VITAMIN D3) 125 MCG (5000 UT) CAPS Take 5,000 Units by mouth daily.  Marland Kitchen ezetimibe (ZETIA) 10 MG tablet Take     1 tablet     Daily      for Cholesterol  . fluticasone (FLONASE) 50 MCG/ACT nasal spray Place 2 sprays into both nostrils daily as needed for allergies.  . Multiple Vitamins-Minerals (MULTIVITAMIN PO) Take 1 tablet by mouth daily.  . rosuvastatin (CRESTOR) 20 MG tablet Take     1 tablet     Daily      for Cholesterol  . SUMAtriptan (IMITREX) 20 MG/ACT nasal spray USE ONE DOSE IN THE NOSE NOW, MAY REPEAT IN 2 HOURS. MAX OF 2 DOSES IN 24 HOURS. (Patient taking differently: Place 20 mg into the nose every 2 (two) hours as needed for migraine.)  . topiramate (TOPAMAX) 100 MG tablet Take     1 tablet     2 x /day      for Headache Prevention  . zinc gluconate 50 MG tablet Take 50 mg by mouth daily.   No current facility-administered medications on file prior to visit.    ROS: all negative except above.   Physical Exam:  BP 120/80   Pulse 81   Temp 98.1 F (36.7 C)   Wt 139 lb (63 kg)   SpO2 95%   BMI 22.44 kg/m   General Appearance: Well nourished, in no apparent distress. Eyes: PERRLA, EOMs, conjunctiva no swelling or erythema Sinuses: No Frontal/maxillary tenderness ENT/Mouth: Ext aud canals clear, TMs without erythema, bulging. No erythema, swelling, or exudate on post pharynx.  Tonsils not swollen or erythematous. Hearing normal.  Neck: Supple, thyroid normal.   Respiratory: Respiratory effort normal, BS equal bilaterally without rales, rhonchi, wheezing or stridor.  Cardio: RRR with no MRGs. Brisk peripheral pulses without edema.  Abdomen: Soft, + BS.  Non tender, no guarding, rebound, hernias, masses. Lymphatics: Non tender without lymphadenopathy.  Musculoskeletal: Full ROM, 5/5 strength, normal gait.  Skin: Warm, dry without rashes, lesions, ecchymosis.  Neuro: Cranial nerves intact. Normal muscle tone, no cerebellar symptoms. Sensation intact.  Psych: Awake  and oriented X 3, normal affect, Insight and Judgment appropriate.     Garnet Sierras, NP 3:30 PM [facility]

## 2020-06-22 ENCOUNTER — Other Ambulatory Visit: Payer: Self-pay | Admitting: Internal Medicine

## 2020-06-22 DIAGNOSIS — E782 Mixed hyperlipidemia: Secondary | ICD-10-CM

## 2020-06-29 ENCOUNTER — Encounter: Payer: Self-pay | Admitting: Adult Health Nurse Practitioner

## 2020-06-29 ENCOUNTER — Ambulatory Visit: Payer: Medicare PPO | Admitting: Adult Health Nurse Practitioner

## 2020-06-29 ENCOUNTER — Other Ambulatory Visit: Payer: Self-pay

## 2020-06-29 VITALS — BP 128/80 | HR 88 | Temp 98.1°F | Ht 66.0 in | Wt 140.0 lb

## 2020-06-29 DIAGNOSIS — G43009 Migraine without aura, not intractable, without status migrainosus: Secondary | ICD-10-CM | POA: Diagnosis not present

## 2020-06-29 DIAGNOSIS — I10 Essential (primary) hypertension: Secondary | ICD-10-CM

## 2020-06-29 DIAGNOSIS — R6889 Other general symptoms and signs: Secondary | ICD-10-CM | POA: Diagnosis not present

## 2020-06-29 DIAGNOSIS — I700 Atherosclerosis of aorta: Secondary | ICD-10-CM | POA: Diagnosis not present

## 2020-06-29 DIAGNOSIS — Z0001 Encounter for general adult medical examination with abnormal findings: Secondary | ICD-10-CM | POA: Diagnosis not present

## 2020-06-29 DIAGNOSIS — R7309 Other abnormal glucose: Secondary | ICD-10-CM | POA: Diagnosis not present

## 2020-06-29 DIAGNOSIS — Z8673 Personal history of transient ischemic attack (TIA), and cerebral infarction without residual deficits: Secondary | ICD-10-CM

## 2020-06-29 DIAGNOSIS — F411 Generalized anxiety disorder: Secondary | ICD-10-CM

## 2020-06-29 DIAGNOSIS — J309 Allergic rhinitis, unspecified: Secondary | ICD-10-CM

## 2020-06-29 DIAGNOSIS — K5900 Constipation, unspecified: Secondary | ICD-10-CM

## 2020-06-29 DIAGNOSIS — E559 Vitamin D deficiency, unspecified: Secondary | ICD-10-CM

## 2020-06-29 DIAGNOSIS — M8589 Other specified disorders of bone density and structure, multiple sites: Secondary | ICD-10-CM

## 2020-06-29 DIAGNOSIS — Z79899 Other long term (current) drug therapy: Secondary | ICD-10-CM

## 2020-06-29 DIAGNOSIS — K219 Gastro-esophageal reflux disease without esophagitis: Secondary | ICD-10-CM | POA: Diagnosis not present

## 2020-06-29 DIAGNOSIS — E785 Hyperlipidemia, unspecified: Secondary | ICD-10-CM | POA: Diagnosis not present

## 2020-06-29 DIAGNOSIS — Z0000 Encounter for general adult medical examination without abnormal findings: Secondary | ICD-10-CM

## 2020-06-29 DIAGNOSIS — Z6823 Body mass index (BMI) 23.0-23.9, adult: Secondary | ICD-10-CM

## 2020-06-29 LAB — CBC WITH DIFFERENTIAL/PLATELET
Absolute Monocytes: 640 cells/uL (ref 200–950)
Basophils Absolute: 71 cells/uL (ref 0–200)
Basophils Relative: 0.9 %
Eosinophils Absolute: 142 cells/uL (ref 15–500)
Eosinophils Relative: 1.8 %
HCT: 38.1 % (ref 35.0–45.0)
Hemoglobin: 12.5 g/dL (ref 11.7–15.5)
Lymphs Abs: 2275 cells/uL (ref 850–3900)
MCH: 29.8 pg (ref 27.0–33.0)
MCHC: 32.8 g/dL (ref 32.0–36.0)
MCV: 90.9 fL (ref 80.0–100.0)
MPV: 10 fL (ref 7.5–12.5)
Monocytes Relative: 8.1 %
Neutro Abs: 4772 cells/uL (ref 1500–7800)
Neutrophils Relative %: 60.4 %
Platelets: 294 10*3/uL (ref 140–400)
RBC: 4.19 10*6/uL (ref 3.80–5.10)
RDW: 12.2 % (ref 11.0–15.0)
Total Lymphocyte: 28.8 %
WBC: 7.9 10*3/uL (ref 3.8–10.8)

## 2020-06-29 LAB — COMPLETE METABOLIC PANEL WITH GFR
AG Ratio: 1.7 (calc) (ref 1.0–2.5)
ALT: 12 U/L (ref 6–29)
AST: 22 U/L (ref 10–35)
Albumin: 4 g/dL (ref 3.6–5.1)
Alkaline phosphatase (APISO): 55 U/L (ref 37–153)
BUN: 11 mg/dL (ref 7–25)
CO2: 28 mmol/L (ref 20–32)
Calcium: 9.6 mg/dL (ref 8.6–10.4)
Chloride: 103 mmol/L (ref 98–110)
Creat: 0.82 mg/dL (ref 0.60–0.93)
GFR, Est African American: 82 mL/min/{1.73_m2} (ref >=60)
GFR, Est Non African American: 70 mL/min/{1.73_m2} (ref >=60)
Globulin: 2.4 g/dL (calc) (ref 1.9–3.7)
Glucose, Bld: 92 mg/dL (ref 65–99)
Potassium: 4.8 mmol/L (ref 3.5–5.3)
Sodium: 140 mmol/L (ref 135–146)
Total Bilirubin: 0.5 mg/dL (ref 0.2–1.2)
Total Protein: 6.4 g/dL (ref 6.1–8.1)

## 2020-06-29 LAB — LIPID PANEL
Cholesterol: 156 mg/dL (ref <200)
HDL: 71 mg/dL (ref >=50)
LDL Cholesterol (Calc): 70 mg/dL (calc)
Non-HDL Cholesterol (Calc): 85 mg/dL (calc) (ref <130)
Total CHOL/HDL Ratio: 2.2 (calc) (ref <5.0)
Triglycerides: 74 mg/dL (ref <150)

## 2020-06-29 MED ORDER — BUSPIRONE HCL 10 MG PO TABS: 10.0000 mg | ORAL_TABLET | Freq: Three times a day (TID) | ORAL | 2 refills | Status: DC

## 2020-06-29 NOTE — Progress Notes (Signed)
MEDICARE ANNUAL WELLNESS VISIT AND FOLLOW UP  Assessment:   [name] was seen today for follow-up and medicare wellness.  Diagnoses and all orders for this visit:  Medicare annual wellness visit, subsequent Yearly  Essential hypertension Controlled today -     CBC with Differential/Platelet -     COMPLETE METABOLIC PANEL WITH GFR   Hyperlipidemia, mixed Continue Zetia 10mg  and Crestor 20mg  -     Lipid panel  Gastroesophageal reflux disease, esophagitis presence not specified Doing well at this time, continue to monitor diet and triggers No medications -     Magnesium  Abnormal glucose Discussed dieta dn exercise modifications -     Hemoglobin A1c -     Insulin, random  Vitamin D deficiency Continue supplementation -     VITAMIN D 25 Hydroxy (Vit-D Deficiency, Fractures)  Aortic atherosclerosis ([place]) Per CT 03/25/18 Control blood pressure, lipids and glucose Disscused lifestyle modifications, diet & exercise Continue to monitor  Migraine without aura and without status migrainosus, not intractable Doing well on current regiment Continue sumatriptan PRN, has not used this year No longer taking Topamax for this Continue to monitor  Generalized anxiety disorder Improved Continue -     escitalopram (LEXAPRO) 10 MG tablet; Take 1 tablet (10 mg total) by mouth daily. -     busPIRone (BUSPAR) 10 MG tablet; Take 1 tablet (10 mg total) by mouth 3 (three) times daily. Discussed counseling services in addition to medications Patient is open to this, has not made appointment considering   Osteopenia of multiple sites Continue VITAMIN D 25 Hydroxy (Vit-D Deficiency) DEXA UTD  Constipation Discussed OTC Miralax daily or every other day Increase fiber in diet Increase water intake  History of TIA Control blood pressure, lipids and glucose Disscused lifestyle modifications, diet & exercise Continue to monitor  Allergic Rhinitis Try OTC nasal saline gel Stop  flonase if irritated  BMI 23.0-23.9, adult Discussed dietary and exercise modifications  Medication Management Continued  Over 40 minutes of face to face exam, counseling, chart review and critical decision making was performed  Future Appointments  Date Time Provider [place]  09/06/2020  2:00 PM Unk Pinto, MD [facility] None  10/05/2020 10:00 AM Garnet Sierras, NP [facility] None  06/29/2021  4:00 PM Liane Comber, NP [facility] None    Plan:   During the course of the visit the patient was educated and counseled about appropriate screening and preventive services including:    Pneumococcal vaccine   Prevnar 13  Influenza vaccine  Td vaccine  Screening electrocardiogram  Bone densitometry screening  Colorectal cancer screening  Diabetes screening  Glaucoma screening  Nutrition counseling   Advanced directives: requested   Subjective:  Deborah [name] is a 75 y.o. female who presents for Medicare Annual Wellness Visit and 3 month follow up.    Last OV 06/14/20 for anxiety.  We added buspar TID, she tolerated this well.  Reports she has an improvement in her mood and overall anxiety.  She would like to continue taking this.  Discussed contacting over MyChart with any new or worsening symptoms.  She has history of migraines.  She reports well controlled as she avoid triggers to this.    She has history of incontinence in the past but reports she is doing well at this time.  She battles with seasonal allergies and is using Flonase PRN.  She also uses OTC antihistamines, varies.  Reports her nasal passage are very dry and become tender at times.  She  reports she stops flonase if this occurs with improvement.  She has tried OTC nasal saline with minimal improvement but does eventually resolve.   Her blood pressure has been controlled at home, today their BP is BP: 128/80 She does workout. She denies chest pain, shortness of breath, dizziness.  She  is on cholesterol medication and denies myalgias. Her cholesterol is at goal. The cholesterol last visit was:   Lab Results  Component Value Date   CHOL 158 01/30/2020   HDL 71 01/30/2020   LDLCALC 66 01/30/2020   TRIG 119 01/30/2020   CHOLHDL 2.2 01/30/2020   . She has been working on diet and exercise for abnormal glucose and denies increased appetite, paresthesia of the feet, polydipsia, polyuria, visual disturbances and vomiting. Last A1C in the office was:  Lab Results  Component Value Date   HGBA1C 5.7 (H) 01/30/2020   Last GFR:   Lab Results  Component Value Date   GFRNONAA 65 01/30/2020   Lab Results  Component Value Date   GFRAA 76 01/30/2020   Patient is on Vitamin D supplement for defciency (18 / 2008) Lab Results  Component Value Date   VD25OH 77 01/30/2020      Medication Review: Current Outpatient Medications on File Prior to Visit  Medication Sig Dispense Refill  . alendronate (FOSAMAX) 70 MG tablet Take      1 tablet      every 7 days       with a full glass of water       on an empty stomach       for 60 minutes 13 tablet 0  . ALPRAZolam (XANAX) 0.5 MG tablet Take 1/2 -1 tablet 2-3 x /day  ONLY  if needed for Anxiety Attack &  limit to 5 days /week to avoid addiction 60 tablet 0  . Ascorbic Acid (VITAMIN C) 1000 MG tablet Take 1,000 mg by mouth daily.    Marland Kitchen aspirin EC 81 MG tablet Take 81 mg by mouth daily.    Marland Kitchen azelastine (ASTELIN) 0.1 % nasal spray Place 2 sprays into both nostrils 2 (two) times daily. Use in each nostril as directed 30 mL 2  . b complex vitamins capsule Take 1 capsule by mouth daily.    . Cholecalciferol (VITAMIN D3) 125 MCG (5000 UT) CAPS Take 5,000 Units by mouth daily.    Marland Kitchen escitalopram (LEXAPRO) 10 MG tablet Take 1 tablet (10 mg total) by mouth daily. 90 tablet 2  . ezetimibe (ZETIA) 10 MG tablet TAKE 1 TABLET BY MOUTH ONCE DAILY FOR CHOLESTEROL 90 tablet 2  . fluticasone (FLONASE) 50 MCG/ACT nasal spray Place 2 sprays into both  nostrils daily as needed for allergies. 16 g 3  . Multiple Vitamins-Minerals (MULTIVITAMIN PO) Take 1 tablet by mouth daily.    . rosuvastatin (CRESTOR) 20 MG tablet TAKE 1 TABLET BY MOUTH ONCE DAILY FOR CHOLESTEROL 90 tablet 2  . SUMAtriptan (IMITREX) 20 MG/ACT nasal spray USE ONE DOSE IN THE NOSE NOW, MAY REPEAT IN 2 HOURS. MAX OF 2 DOSES IN 24 HOURS. (Patient taking differently: Place 20 mg into the nose every 2 (two) hours as needed for migraine.) 12 Inhaler 2  . topiramate (TOPAMAX) 100 MG tablet Take     1 tablet     2 x /day      for Headache Prevention 180 tablet 0  . zinc gluconate 50 MG tablet Take 50 mg by mouth daily.     No current facility-administered  medications on file prior to visit.    Allergies  Allergen Reactions  . Banana Nausea And Vomiting  . Chocolate Other (See Comments)    Unknown  . Effexor [Venlafaxine] Other (See Comments)    insomnia  . Maxalt [Rizatriptan Benzoate] Nausea Only and Other (See Comments)    Nausea and excessive salivation  . Other Other (See Comments)    NUTS (all kinds)  . Tussin [Guaifenesin] Rash  . Zostavax [Zoster Vaccine Live] Itching and Rash    Current Problems (verified) Patient Active Problem List   Diagnosis Date Noted  . Syncope 03/25/2018  . Primary osteoarthritis of left knee 03/22/2018  . GERD (gastroesophageal reflux disease) 02/25/2018  . Osteopenia 02/25/2018  . Compression fracture of T12 vertebra ([place]) 05/30/2017  . Aortic atherosclerosis ([place]) 02/03/2017  . History of TIA (transient ischemic attack) 12/15/2016  . BMI 24.0-24.9, adult 03/12/2015  . Skin cancer 08/06/2014  . Migraine 08/06/2014  . Generalized anxiety disorder 08/06/2014  . Abnormal glucose 07/01/2013  . Medication management 07/01/2013  . Hypertension   . Hyperlipidemia   . Vitamin D deficiency     Screening Tests Immunization History  Administered Date(s) Administered  . Influenza, High Dose Seasonal PF 01/01/2014, 03/15/2015,  12/13/2015, 01/03/2017, 02/05/2018, 01/30/2020  . Influenza-Unspecified 12/26/2012  . PFIZER(Purple Top)SARS-COV-2 Vaccination 05/15/2019, 06/12/2019, 01/18/2020  . Pneumococcal Conjugate-13 08/06/2014  . Pneumococcal-Unspecified 12/26/2012  . Td 12/26/2012  . Tdap 05/08/2017  . Zoster 12/08/2010    Preventative care: Last colonoscopy: 2019, polyps Q5years due for 2021 Q5year Last mammogram: 06/2019  DEXA: 01/2020,  Prior vaccinations: TD or Tdap: 2019  Influenza: 2021 Pneumococcal: 2013 Prevnar13: 2016 Shingles/Zostavax: 2012  Names of Other Physician/Practitioners you currently use: 1. [facility] here for primary care 2. Eye Exam 2018, Due for 2022 3. Dental Exam 2022  Patient Care Team: Unk Pinto, MD as PCP - General (Internal Medicine) Richmond Campbell, MD as Consulting Physician (Gastroenterology) Shirley Muscat Loreen Freud, MD as Referring Physician (Optometry) Berenice Primas, MD as Referring Physician (Orthopedic Surgery) Janann August, MD as Referring Physician (Dermatology)  SURGICAL HISTORY She  has a past surgical history that includes Spine surgery (2010); Mohs surgery (Right, 04/2013); Knee arthroscopy (Bilateral, 04/2014); Appendectomy (1959); Cesarean section (1978); Cataract extraction, bilateral (Bilateral, 2014); Colonoscopy w/ polypectomy (08/02/2017); Upper gi endoscopy (10/16/2016); and Total knee arthroplasty (Left, 03/22/2018). FAMILY HISTORY Her family history includes Diabetes in her mother; Heart disease in her father and mother; Hyperlipidemia in her sister; Hypertension in her brother and father; Stroke in her father. SOCIAL HISTORY She  reports that she quit smoking about 34 years ago. Her smoking use included cigarettes. She has a 4.00 pack-year smoking history. She has never used smokeless tobacco. She reports previous alcohol use. She reports that she does not use drugs.   MEDICARE WELLNESS  OBJECTIVES: Physical activity: Current Exercise Habits: Home exercise routine;The patient does not participate in regular exercise at present (Not curently related to knee), Type of exercise: walking, Time (Minutes): 20, Frequency (Times/Week): 6, Weekly Exercise (Minutes/Week): 120, Intensity: Mild, Exercise limited by: orthopedic condition(s) Cardiac risk factors: Cardiac Risk Factors include: advanced age (>44men, >59 women);dyslipidemia Depression/mood screen:   Depression screen [facility] 2/9 06/29/2020  Decreased Interest 0  Down, Depressed, Hopeless 0  PHQ - 2 Score 0    ADLs:  In your present state of health, do you have any difficulty performing the following activities: 06/29/2020 01/29/2020  Hearing? N N  Vision? N N  Difficulty concentrating or making  decisions? N N  Walking or climbing stairs? N N  Dressing or bathing? N N  Doing errands, shopping? N N  Preparing Food and eating ? N -  Using the Toilet? N -  In the past six months, have you accidently leaked urine? N -  Do you have problems with loss of bowel control? N -  Managing your Medications? N -  Managing your Finances? N -  Housekeeping or managing your Housekeeping? N -  Some recent data might be hidden     Cognitive Testing  Alert? Yes  Normal Appearance?Yes  Oriented to person? Yes  Place? Yes   Time? Yes  Recall of three objects?  Yes  Can perform simple calculations? Yes  Displays appropriate judgment?Yes  Can read the correct time from a watch face?Yes  EOL planning: Does Patient Have a Medical Advance Directive?: No Does patient want to make changes to medical advance directive?: No - Patient declined Would patient like information on creating a medical advance directive?: Yes (MAU/Ambulatory/Procedural Areas - Information given)  Review of Systems  Constitutional: Negative for chills, diaphoresis, fever, malaise/fatigue and weight loss.  HENT: Negative for congestion, ear discharge, ear pain, hearing  loss, nosebleeds, sinus pain, sore throat and tinnitus.   Eyes: Negative for blurred vision, double vision, photophobia, pain, discharge and redness.  Respiratory: Negative for cough, hemoptysis, sputum production, shortness of breath, wheezing and stridor.   Cardiovascular: Negative for chest pain, palpitations, orthopnea, claudication, leg swelling and PND.  Gastrointestinal: Negative for abdominal pain, blood in stool, constipation, diarrhea, heartburn, melena, nausea and vomiting.  Genitourinary: Negative for dysuria, flank pain, frequency, hematuria and urgency.  Musculoskeletal: Negative for back pain, falls, joint pain, myalgias and neck pain.  Skin: Negative for itching and rash.  Neurological: Negative for dizziness, tingling, tremors, sensory change, speech change, focal weakness, seizures, loss of consciousness, weakness and headaches.  Endo/Heme/Allergies: Negative for environmental allergies and polydipsia. Does not bruise/bleed easily.  Psychiatric/Behavioral: Negative for depression, hallucinations, memory loss, substance abuse and suicidal ideas. The patient is nervous/anxious. The patient does not have insomnia.      Objective:     Today's Vitals   06/29/20 1528  BP: 128/80  Pulse: 88  Temp: 98.1 F (36.7 C)  SpO2: 98%  Weight: 140 lb (63.5 kg)  Height: 5\' 6"  (1.676 m)  PainSc: 0-No pain   Body mass index is 22.6 kg/m.  General appearance: alert, no distress, WD/WN, female HEENT: normocephalic, sclerae anicteric, TMs pearly, nares patent, no discharge or erythema, pharynx normal Oral cavity: MMM, no lesions Neck: supple, no lymphadenopathy, no thyromegaly, no masses Heart: RRR, normal S1, S2, no murmurs Lungs: CTA bilaterally, no wheezes, rhonchi, or rales Abdomen: +bs, soft, non tender, non distended, no masses, no hepatomegaly, no splenomegaly Musculoskeletal: nontender, no swelling, no obvious deformity Extremities: no edema, no cyanosis, no clubbing Pulses:  2+ symmetric, upper and lower extremities, normal cap refill Neurological: alert, oriented x 3, CN2-12 intact, strength normal upper extremities and lower extremities, sensation normal throughout, DTRs 2+ throughout, no cerebellar signs, gait normal Psychiatric: normal affect, behavior normal, pleasant   Medicare Attestation I have personally reviewed: The patient's medical and social history Their use of alcohol, tobacco or illicit drugs Their current medications and supplements The patient's functional ability including ADLs,fall risks, home safety risks, cognitive, and hearing and visual impairment Diet and physical activities Evidence for depression or mood disorders  The patient's weight, height, BMI, and visual acuity have been recorded in the chart.  I have made referrals, counseling, and provided education to the patient based on review of the above and I have provided the patient with a written personalized care plan for preventive services.     Garnet Sierras, NP [facility] 06/29/20  4:15 PM

## 2020-06-30 DIAGNOSIS — M25461 Effusion, right knee: Secondary | ICD-10-CM | POA: Diagnosis not present

## 2020-06-30 DIAGNOSIS — M1711 Unilateral primary osteoarthritis, right knee: Secondary | ICD-10-CM | POA: Diagnosis not present

## 2020-07-01 ENCOUNTER — Encounter: Payer: Medicare Other | Admitting: Internal Medicine

## 2020-07-01 ENCOUNTER — Other Ambulatory Visit: Payer: Self-pay | Admitting: Family Medicine

## 2020-07-01 DIAGNOSIS — M25461 Effusion, right knee: Secondary | ICD-10-CM

## 2020-07-05 DIAGNOSIS — M25461 Effusion, right knee: Secondary | ICD-10-CM | POA: Diagnosis not present

## 2020-07-05 DIAGNOSIS — M1711 Unilateral primary osteoarthritis, right knee: Secondary | ICD-10-CM | POA: Diagnosis not present

## 2020-07-07 DIAGNOSIS — M25561 Pain in right knee: Secondary | ICD-10-CM | POA: Diagnosis not present

## 2020-07-08 DIAGNOSIS — M25461 Effusion, right knee: Secondary | ICD-10-CM | POA: Diagnosis not present

## 2020-07-08 DIAGNOSIS — M1711 Unilateral primary osteoarthritis, right knee: Secondary | ICD-10-CM | POA: Diagnosis not present

## 2020-07-15 DIAGNOSIS — M25461 Effusion, right knee: Secondary | ICD-10-CM | POA: Diagnosis not present

## 2020-07-15 DIAGNOSIS — M25061 Hemarthrosis, right knee: Secondary | ICD-10-CM | POA: Diagnosis not present

## 2020-07-15 DIAGNOSIS — M1711 Unilateral primary osteoarthritis, right knee: Secondary | ICD-10-CM | POA: Diagnosis not present

## 2020-07-20 ENCOUNTER — Other Ambulatory Visit: Payer: Medicare PPO

## 2020-07-20 DIAGNOSIS — M25061 Hemarthrosis, right knee: Secondary | ICD-10-CM | POA: Diagnosis not present

## 2020-07-20 DIAGNOSIS — M25561 Pain in right knee: Secondary | ICD-10-CM | POA: Diagnosis not present

## 2020-07-20 DIAGNOSIS — M25461 Effusion, right knee: Secondary | ICD-10-CM | POA: Diagnosis not present

## 2020-07-21 DIAGNOSIS — X58XXXA Exposure to other specified factors, initial encounter: Secondary | ICD-10-CM | POA: Diagnosis not present

## 2020-07-21 DIAGNOSIS — M94261 Chondromalacia, right knee: Secondary | ICD-10-CM | POA: Diagnosis not present

## 2020-07-21 DIAGNOSIS — S83271A Complex tear of lateral meniscus, current injury, right knee, initial encounter: Secondary | ICD-10-CM | POA: Diagnosis not present

## 2020-07-21 DIAGNOSIS — S83272A Complex tear of lateral meniscus, current injury, left knee, initial encounter: Secondary | ICD-10-CM | POA: Diagnosis not present

## 2020-07-21 DIAGNOSIS — M6751 Plica syndrome, right knee: Secondary | ICD-10-CM | POA: Diagnosis not present

## 2020-07-21 DIAGNOSIS — Y999 Unspecified external cause status: Secondary | ICD-10-CM | POA: Diagnosis not present

## 2020-07-21 DIAGNOSIS — M94262 Chondromalacia, left knee: Secondary | ICD-10-CM | POA: Diagnosis not present

## 2020-07-21 DIAGNOSIS — M25461 Effusion, right knee: Secondary | ICD-10-CM | POA: Diagnosis not present

## 2020-07-27 DIAGNOSIS — Z4789 Encounter for other orthopedic aftercare: Secondary | ICD-10-CM | POA: Diagnosis not present

## 2020-07-27 DIAGNOSIS — M25561 Pain in right knee: Secondary | ICD-10-CM | POA: Diagnosis not present

## 2020-07-27 DIAGNOSIS — M25061 Hemarthrosis, right knee: Secondary | ICD-10-CM | POA: Diagnosis not present

## 2020-08-02 DIAGNOSIS — Z4789 Encounter for other orthopedic aftercare: Secondary | ICD-10-CM | POA: Diagnosis not present

## 2020-08-02 DIAGNOSIS — M25561 Pain in right knee: Secondary | ICD-10-CM | POA: Diagnosis not present

## 2020-08-03 DIAGNOSIS — R2689 Other abnormalities of gait and mobility: Secondary | ICD-10-CM | POA: Diagnosis not present

## 2020-08-03 DIAGNOSIS — M6281 Muscle weakness (generalized): Secondary | ICD-10-CM | POA: Diagnosis not present

## 2020-08-03 DIAGNOSIS — Z9889 Other specified postprocedural states: Secondary | ICD-10-CM | POA: Diagnosis not present

## 2020-08-11 ENCOUNTER — Other Ambulatory Visit: Payer: Self-pay | Admitting: Adult Health Nurse Practitioner

## 2020-08-11 DIAGNOSIS — R2689 Other abnormalities of gait and mobility: Secondary | ICD-10-CM | POA: Diagnosis not present

## 2020-08-11 DIAGNOSIS — Z9889 Other specified postprocedural states: Secondary | ICD-10-CM | POA: Diagnosis not present

## 2020-08-11 DIAGNOSIS — F411 Generalized anxiety disorder: Secondary | ICD-10-CM

## 2020-08-11 DIAGNOSIS — M6281 Muscle weakness (generalized): Secondary | ICD-10-CM | POA: Diagnosis not present

## 2020-08-17 DIAGNOSIS — M6281 Muscle weakness (generalized): Secondary | ICD-10-CM | POA: Diagnosis not present

## 2020-08-17 DIAGNOSIS — Z9889 Other specified postprocedural states: Secondary | ICD-10-CM | POA: Diagnosis not present

## 2020-08-17 DIAGNOSIS — R2689 Other abnormalities of gait and mobility: Secondary | ICD-10-CM | POA: Diagnosis not present

## 2020-08-19 DIAGNOSIS — M6281 Muscle weakness (generalized): Secondary | ICD-10-CM | POA: Diagnosis not present

## 2020-08-19 DIAGNOSIS — R2689 Other abnormalities of gait and mobility: Secondary | ICD-10-CM | POA: Diagnosis not present

## 2020-08-19 DIAGNOSIS — Z9889 Other specified postprocedural states: Secondary | ICD-10-CM | POA: Diagnosis not present

## 2020-08-24 DIAGNOSIS — R2689 Other abnormalities of gait and mobility: Secondary | ICD-10-CM | POA: Diagnosis not present

## 2020-08-24 DIAGNOSIS — Z9889 Other specified postprocedural states: Secondary | ICD-10-CM | POA: Diagnosis not present

## 2020-08-24 DIAGNOSIS — M6281 Muscle weakness (generalized): Secondary | ICD-10-CM | POA: Diagnosis not present

## 2020-08-27 DIAGNOSIS — M6281 Muscle weakness (generalized): Secondary | ICD-10-CM | POA: Diagnosis not present

## 2020-08-27 DIAGNOSIS — Z9889 Other specified postprocedural states: Secondary | ICD-10-CM | POA: Diagnosis not present

## 2020-08-27 DIAGNOSIS — R2689 Other abnormalities of gait and mobility: Secondary | ICD-10-CM | POA: Diagnosis not present

## 2020-08-30 DIAGNOSIS — R2689 Other abnormalities of gait and mobility: Secondary | ICD-10-CM | POA: Diagnosis not present

## 2020-08-30 DIAGNOSIS — Z9889 Other specified postprocedural states: Secondary | ICD-10-CM | POA: Diagnosis not present

## 2020-08-30 DIAGNOSIS — M6281 Muscle weakness (generalized): Secondary | ICD-10-CM | POA: Diagnosis not present

## 2020-08-31 ENCOUNTER — Other Ambulatory Visit: Payer: Self-pay | Admitting: Internal Medicine

## 2020-08-31 DIAGNOSIS — Z1231 Encounter for screening mammogram for malignant neoplasm of breast: Secondary | ICD-10-CM

## 2020-09-02 DIAGNOSIS — Z9889 Other specified postprocedural states: Secondary | ICD-10-CM | POA: Diagnosis not present

## 2020-09-02 DIAGNOSIS — M6281 Muscle weakness (generalized): Secondary | ICD-10-CM | POA: Diagnosis not present

## 2020-09-02 DIAGNOSIS — R2689 Other abnormalities of gait and mobility: Secondary | ICD-10-CM | POA: Diagnosis not present

## 2020-09-06 ENCOUNTER — Encounter: Payer: Self-pay | Admitting: Internal Medicine

## 2020-09-06 ENCOUNTER — Ambulatory Visit: Payer: Medicare PPO | Admitting: Internal Medicine

## 2020-09-06 ENCOUNTER — Other Ambulatory Visit: Payer: Self-pay

## 2020-09-06 VITALS — BP 102/66 | HR 89 | Temp 97.7°F | Resp 16 | Ht 66.0 in | Wt 141.6 lb

## 2020-09-06 DIAGNOSIS — Z1211 Encounter for screening for malignant neoplasm of colon: Secondary | ICD-10-CM

## 2020-09-06 DIAGNOSIS — R7309 Other abnormal glucose: Secondary | ICD-10-CM

## 2020-09-06 DIAGNOSIS — Z8249 Family history of ischemic heart disease and other diseases of the circulatory system: Secondary | ICD-10-CM | POA: Diagnosis not present

## 2020-09-06 DIAGNOSIS — Z136 Encounter for screening for cardiovascular disorders: Secondary | ICD-10-CM | POA: Diagnosis not present

## 2020-09-06 DIAGNOSIS — Z79899 Other long term (current) drug therapy: Secondary | ICD-10-CM

## 2020-09-06 DIAGNOSIS — G8929 Other chronic pain: Secondary | ICD-10-CM

## 2020-09-06 DIAGNOSIS — Z0001 Encounter for general adult medical examination with abnormal findings: Secondary | ICD-10-CM

## 2020-09-06 DIAGNOSIS — E559 Vitamin D deficiency, unspecified: Secondary | ICD-10-CM

## 2020-09-06 DIAGNOSIS — Z0000 Encounter for general adult medical examination without abnormal findings: Secondary | ICD-10-CM

## 2020-09-06 DIAGNOSIS — E782 Mixed hyperlipidemia: Secondary | ICD-10-CM | POA: Diagnosis not present

## 2020-09-06 DIAGNOSIS — Z8673 Personal history of transient ischemic attack (TIA), and cerebral infarction without residual deficits: Secondary | ICD-10-CM

## 2020-09-06 DIAGNOSIS — I700 Atherosclerosis of aorta: Secondary | ICD-10-CM | POA: Diagnosis not present

## 2020-09-06 DIAGNOSIS — I10 Essential (primary) hypertension: Secondary | ICD-10-CM

## 2020-09-06 DIAGNOSIS — G43009 Migraine without aura, not intractable, without status migrainosus: Secondary | ICD-10-CM

## 2020-09-06 DIAGNOSIS — Z1212 Encounter for screening for malignant neoplasm of rectum: Secondary | ICD-10-CM

## 2020-09-06 DIAGNOSIS — Z87891 Personal history of nicotine dependence: Secondary | ICD-10-CM

## 2020-09-06 DIAGNOSIS — M25561 Pain in right knee: Secondary | ICD-10-CM

## 2020-09-06 DIAGNOSIS — K219 Gastro-esophageal reflux disease without esophagitis: Secondary | ICD-10-CM

## 2020-09-06 MED ORDER — MELOXICAM 15 MG PO TABS: ORAL_TABLET | ORAL | 3 refills | Status: DC

## 2020-09-06 NOTE — Patient Instructions (Signed)

## 2020-09-06 NOTE — Progress Notes (Signed)
Annual Screening/Preventative Visit & Comprehensive Evaluation &  Examination  Future Appointments  Date Time Provider [place]  09/06/2020  2:00 PM Unk Pinto, MD [facility] None  10/05/2020 10:00 AM Garnet Sierras, NP [facility] None  06/29/2021  4:00 PM Liane Comber, NP [facility] None  09/06/2021  3:00 PM Unk Pinto, MD [facility] None        This very nice 75 y.o. MWF presents for a Screening /Preventative Visit & comprehensive evaluation and management of multiple medical co-morbidities.  Patient has been followed for HTN, HLD, Prediabetes  and Vitamin D Deficiency. Abd CT scan showed Aortic AS in 2018.       Patient is s/p Rt TKR and had recent Lt knee arthroscopy and is still experiencing moderate pain/discomfort.         [[  copied 06/24/2019:: In 2018, she was hospitalized with confusion, first dx'd as atypical seizure vs confusional migraine. Initially she was treated with Topamax then Keppra - later d/c'd in 2020 and she has done fine since then.  ]]        HTN predates since 2010. Patient's BP has been controlled at home and patient denies any cardiac symptoms as chest pain, palpitations, shortness of breath, dizziness or ankle swelling. Today's BP is at goal -  102/66.        Patient's hyperlipidemia is controlled with diet and Rosuvastatin. Patient denies myalgias or other medication SE's. Last lipids were at goal:  Lab Results  Component Value Date   CHOL 156 06/29/2020   HDL 71 06/29/2020   LDLCALC 70 06/29/2020   TRIG 74 06/29/2020   CHOLHDL 2.2 06/29/2020         Patient has hx/o prediabetes (A1c 6.1% /2011) and patient denies reactive hypoglycemic symptoms, visual blurring, diabetic polys or paresthesias. Last A1c was near goal:  Lab Results  Component Value Date   HGBA1C 5.7 (H) 01/30/2020         Finally, patient has history of Vitamin D Deficiency ("18" /2008) and last Vitamin D was at goal:  Lab Results   Component Value Date   VD25OH 77 01/30/2020     Current Outpatient Medications on File Prior to Visit  Medication Sig  . alendronate 70 MG tablet Tak 1 tablet every 7 days    . ALPRAZolam 0.5 MG tablet Take 1/2 -1 tablet 2-3 x /day  ONLY  if needed   . VITAMIN C 1000 MG tablet Take  daily.  Marland Kitchen aspirin EC 81 MG tablet Take  daily.  . ASTELIN 0.1 % nasal spray Place 2 sprays into both nostrils 2  times daily  . b complex vitamins capsule Take 1 capsule by mouth daily.  . busPIRone (BUSPAR) 10 MG tablet Take  1 tablet  3 x /day  for Anxiety  . VITAMIN D  5000 u Take daily.  Marland Kitchen escitalopram  10 MG tablet Take 1 tablet  daily.  Marland Kitchen ezetimibe10 MG tablet TAKE 1 TABLET DAILY FOR CHOLESTEROL  . FLONASE  nasal spray Place 2 sprays into both nostrils daily as needed .  Marland Kitchen Multiple Vitamins-Minerals  Take 1 tablet  daily.  . rosuvastatin20 MG tablet TAKE 1 TABLET  DAILY   . IMITREX 20 MG/ACT nasal spray USE ONE DOSE IN THE NOSE NOW, MAY REPEAT IN 2 HOURS   . topiramate (100 MG tablet Take 1 tablet 2 x /day   . zinc 50 MG tablet Take  daily.      Allergies  Allergen  Reactions  . Banana Nausea And Vomiting  . Chocolate Other (See Comments)    Unknown  . Effexor [Venlafaxine] Other (See Comments)    insomnia  . Maxalt [Rizatriptan Benzoate] Nausea Only and Other (See Comments)    Nausea and excessive salivation  . Other Other (See Comments)    NUTS (all kinds)  . Tussin [Guaifenesin] Rash  . Zostavax [Zoster Vaccine Live] Itching and Rash     Past Medical History:  Diagnosis Date  . Allergy   . Anemia   . Anxiety   . Aortic atherosclerosis ([place]) 11/2016   Noted on CT abd  . Arthritis    hands and knees  . Bilateral cataracts    History of  . Cellulitis    Right finger  . Cerebral amyloid angiopathy (CODE)   . Cervical spondylosis 12/2008   Noted on Cervical spine  . Chronic nausea   . Compression fracture of T12 vertebra ([facility]) 06/07/2017   Mild  . DDD (degenerative disc  disease), cervical 12/2008   Noted on Cervical spine  . Delusions ([place]) 03/24/2018  . Depression   . Encephalopathy 01/02/2017   Mild, [place] [facility]  . GERD (gastroesophageal reflux disease)   . Helicobacter pylori infection 2018  . History of appendicitis    age 57  . History of colon polyps   . History of confusion   . History of hiatal hernia 10/16/2016   Noted on Endoscopy  . Hyperlipidemia   . Incontinence in female 08/06/2014  . Internal hemorrhoid 08/02/2017   Noted on Colonoscopy  . Low blood pressure reading   . Migraines   . Osteopenia   . PONV (postoperative nausea and vomiting)   . Pre-diabetes   . RLS (restless legs syndrome)   . Seizure ([place])    atypical partial comples vs. confusional migraines  . Seizure disorder ([place]) 12/15/2016  . Skin cancer    right lower eyelid  . Squamous cell skin cancer    history of neck and upper back  . Syncope 03/25/2018  . TIA (transient ischemic attack)    questionable per patient  . Vitamin D deficiency      Health Maintenance  Topic Date Due  . COLONOSCOPY (Pts 45-3yrs Insurance coverage will need to be confirmed)  08/03/2019  . COVID-19 Vaccine (4 - Booster for Pfizer series) 04/19/2020  . INFLUENZA VACCINE  11/15/2020  . MAMMOGRAM  06/22/2021  . TETANUS/TDAP  05/09/2027  . DEXA SCAN  Completed  . Hepatitis C Screening  Completed  . PNA vac Low Risk Adult  Completed  . HPV VACCINES  Aged Out    Last Colon - 08/02/2017 - Dr Earlean Shawl - Recc f/u 2 years due Apr 2021 - patient relates never received recall.    MGM - scheduled  09/22/2020   Past Surgical History:  Procedure Laterality Date  . APPENDECTOMY  1959  . CATARACT EXTRACTION, BILATERAL Bilateral 2014  . [place]  . COLONOSCOPY W/ POLYPECTOMY  08/02/2017  . KNEE ARTHROSCOPY Bilateral 04/2014   [facility]  . MOHS SURGERY Right 04/2013   Squamous cell cacinoma, neck/upper back  . SPINE SURGERY  2010   C6-C7 fusion  . TOTAL KNEE  ARTHROPLASTY Left 03/22/2018   Procedure: LEFT TOTAL KNEE ARTHROPLASTY;  Surgeon: Dorna Leitz, MD;  Location: [facility];  Service: Orthopedics;  Laterality: Left;  Adductor Block  . UPPER GI ENDOSCOPY  10/16/2016     Family History  Problem Relation Age of Onset  .  Diabetes Mother   . Heart disease Mother   . Heart disease Father   . Stroke Father   . Hypertension Father   . Hyperlipidemia Sister   . Hypertension Brother     Social History   Tobacco Use  . Smoking status: Former Smoker    Packs/day: 1.00    Years: 4.00    Pack years: 4.00    Types: Cigarettes    Quit date: 03/25/1986    Years since quitting: 34.4  . Smokeless tobacco: Never Used  Vaping Use  . Vaping Use: Never used  Substance Use Topics  . Alcohol use: Not Currently    Comment: occasional  . Drug use: No      ROS Constitutional: Denies fever, chills, weight loss/gain, headaches, insomnia,  night sweats, and change in appetite. Does c/o fatigue. Eyes: Denies redness, blurred vision, diplopia, discharge, itchy, watery eyes.  ENT: Denies discharge, congestion, post nasal drip, epistaxis, sore throat, earache, hearing loss, dental pain, Tinnitus, Vertigo, Sinus pain, snoring.  Cardio: Denies chest pain, palpitations, irregular heartbeat, syncope, dyspnea, diaphoresis, orthopnea, PND, claudication, edema Respiratory: denies cough, dyspnea, DOE, pleurisy, hoarseness, laryngitis, wheezing.  Gastrointestinal: Denies dysphagia, heartburn, reflux, water brash, pain, cramps, nausea, vomiting, bloating, diarrhea, constipation, hematemesis, melena, hematochezia, jaundice, hemorrhoids Genitourinary: Denies dysuria, frequency, urgency, nocturia, hesitancy, discharge, hematuria, flank pain Breast: Breast lumps, nipple discharge, bleeding.  Musculoskeletal: Denies arthralgia, myalgia, stiffness, Jt. Swelling, pain, limp, and strain/sprain. Denies falls. Skin: Denies puritis, rash, hives, warts, acne, eczema, changing in  skin lesion Neuro: No weakness, tremor, incoordination, spasms, paresthesia, pain Psychiatric: Denies confusion, memory loss, sensory loss. Denies Depression. Endocrine: Denies change in weight, skin, hair change, nocturia, and paresthesia, diabetic polys, visual blurring, hyper / hypo glycemic episodes.  Heme/Lymph: No excessive bleeding, bruising, enlarged lymph nodes.  Physical Exam  BP 102/66   Pulse 89   Temp 97.7 F (36.5 C)   Resp 16   Ht 5\' 6"  (1.676 m)   Wt 141 lb 9.6 oz (64.2 kg)   SpO2 99%   BMI 22.85 kg/m   General Appearance: Well nourished, well groomed and in no apparent distress.  Eyes: PERRLA, EOMs, conjunctiva no swelling or erythema, normal fundi and vessels. Sinuses: No frontal/maxillary tenderness ENT/Mouth: EACs patent / TMs  nl. Nares clear without erythema, swelling, mucoid exudates. Oral hygiene is good. No erythema, swelling, or exudate. Tongue normal, non-obstructing. Tonsils not swollen or erythematous. Hearing normal.  Neck: Supple, thyroid not palpable. No bruits, nodes or JVD. Respiratory: Respiratory effort normal.  BS equal and clear bilateral without rales, rhonci, wheezing or stridor. Cardio: Heart sounds are normal with regular rate and rhythm and no murmurs, rubs or gallops. Peripheral pulses are normal and equal bilaterally without edema. No aortic or femoral bruits. Chest: symmetric with normal excursions and percussion. Breasts: Symmetric, without lumps, nipple discharge, retractions, or fibrocystic changes.  Abdomen: Flat, soft with bowel sounds active. Nontender, no guarding, rebound, hernias, masses, or organomegaly.  Lymphatics: Non tender without lymphadenopathy.  Musculoskeletal: Full ROM all peripheral extremities, joint stability, 5/5 strength, and normal gait. Skin: Warm and dry without rashes, lesions, cyanosis, clubbing or  ecchymosis.  Neuro: Cranial nerves intact, reflexes equal bilaterally. Normal muscle tone, no cerebellar  symptoms. Sensation intact.  Pysch: Alert and oriented X 3, normal affect, Insight and Judgment appropriate.    Assessment and Plan  1. Annual Preventative Screening Examination   2. Essential hypertension  - CBC with Differential/Platelet - COMPLETE METABOLIC PANEL WITH GFR - Magnesium -  TSH - EKG 12-Lead - Urinalysis, Routine w reflex microscopic - Microalbumin / creatinine urine ratio  3. Hyperlipidemia, mixed  - CBC with Differential/Platelet - Lipid panel - TSH - EKG 12-Lead  4. Abnormal glucose  - CBC with Differential/Platelet - Hemoglobin A1c - Insulin, random - EKG 12-Lead  5. Vitamin D deficiency  - VITAMIN D 25 Hydroxy  6. Chronic pain of right knee  - meloxicam 15 MG tablet; Take  1/2 to 1 tablet  Daily    Dispense: 90 tablet; Refill: 3 - also recc take Tylenol 1,000 mg 4 x /day - meals & bedtime - And restart her Gabapentin 300 mg 3 x /day from Dr Blima Rich  7. Aortic atherosclerosis ([place]) by Abd CT scan  11/2016  - CBC with Differential/Platelet - COMPLETE METABOLIC PANEL WITH GFR - Magnesium - Lipid panel - TSH - Hemoglobin A1c - Insulin, random - VITAMIN D 25 Hydroxy - EKG 12-Lead  8. Gastroesophageal reflux disease without esophagitis   9. Migraine    10. History of TIA (transient ischemic attack)   11. Screening for colorectal cancer  - POC Hemoccult Bld/Stl   - Ambulatory referral to Gastroenterology - over due f/u Colonoscopy  12. Screening for ischemic heart disease  - EKG 12-Lead  13. FHx: heart disease  - EKG 12-Lead  14. Former smoker  - EKG 12-Lead  15. Medication management  - CBC with Differential/Platelet - COMPLETE METABOLIC PANEL WITH GFR - Magnesium - Lipid panel - TSH - Hemoglobin A1c - Insulin, random - VITAMIN D 25 Hydroxy - Urinalysis, Routine w reflex microscopic - Microalbumin / creatinine urine ratio         Patient was counseled in prudent diet to achieve/maintain BMI less than 25 for  weight control, BP monitoring, regular exercise and medications. Discussed med's effects and SE's. Screening labs and tests as requested with regular follow-up as recommended. Over 40 minutes of exam, counseling, chart review and high complex critical decision making was performed.   Kirtland Bouchard, MD

## 2020-09-07 ENCOUNTER — Telehealth: Payer: Self-pay | Admitting: *Deleted

## 2020-09-07 ENCOUNTER — Other Ambulatory Visit: Payer: Self-pay | Admitting: Internal Medicine

## 2020-09-07 DIAGNOSIS — I700 Atherosclerosis of aorta: Secondary | ICD-10-CM

## 2020-09-07 DIAGNOSIS — Z136 Encounter for screening for cardiovascular disorders: Secondary | ICD-10-CM

## 2020-09-07 DIAGNOSIS — Z8673 Personal history of transient ischemic attack (TIA), and cerebral infarction without residual deficits: Secondary | ICD-10-CM

## 2020-09-07 DIAGNOSIS — Z87891 Personal history of nicotine dependence: Secondary | ICD-10-CM

## 2020-09-07 DIAGNOSIS — E782 Mixed hyperlipidemia: Secondary | ICD-10-CM

## 2020-09-07 DIAGNOSIS — D649 Anemia, unspecified: Secondary | ICD-10-CM

## 2020-09-07 DIAGNOSIS — N390 Urinary tract infection, site not specified: Secondary | ICD-10-CM

## 2020-09-07 DIAGNOSIS — I10 Essential (primary) hypertension: Secondary | ICD-10-CM

## 2020-09-07 LAB — LIPID PANEL
Cholesterol: 159 mg/dL (ref <200)
HDL: 69 mg/dL (ref >=50)
LDL Cholesterol (Calc): 69 mg/dL (calc)
Non-HDL Cholesterol (Calc): 90 mg/dL (calc) (ref <130)
Total CHOL/HDL Ratio: 2.3 (calc) (ref <5.0)
Triglycerides: 125 mg/dL (ref <150)

## 2020-09-07 LAB — COMPLETE METABOLIC PANEL WITH GFR
AG Ratio: 1.6 (calc) (ref 1.0–2.5)
ALT: 10 U/L (ref 6–29)
AST: 22 U/L (ref 10–35)
Albumin: 4.2 g/dL (ref 3.6–5.1)
Alkaline phosphatase (APISO): 66 U/L (ref 37–153)
BUN: 13 mg/dL (ref 7–25)
CO2: 27 mmol/L (ref 20–32)
Calcium: 10 mg/dL (ref 8.6–10.4)
Chloride: 99 mmol/L (ref 98–110)
Creat: 0.87 mg/dL (ref 0.60–0.93)
GFR, Est African American: 76 mL/min/{1.73_m2} (ref >=60)
GFR, Est Non African American: 66 mL/min/{1.73_m2} (ref >=60)
Globulin: 2.7 g/dL (calc) (ref 1.9–3.7)
Glucose, Bld: 89 mg/dL (ref 65–99)
Potassium: 4.7 mmol/L (ref 3.5–5.3)
Sodium: 134 mmol/L — ABNORMAL LOW (ref 135–146)
Total Bilirubin: 0.4 mg/dL (ref 0.2–1.2)
Total Protein: 6.9 g/dL (ref 6.1–8.1)

## 2020-09-07 LAB — CBC WITH DIFFERENTIAL/PLATELET
Absolute Monocytes: 651 cells/uL (ref 200–950)
Basophils Absolute: 70 cells/uL (ref 0–200)
Basophils Relative: 1 %
Eosinophils Absolute: 119 cells/uL (ref 15–500)
Eosinophils Relative: 1.7 %
HCT: 36.4 % (ref 35.0–45.0)
Hemoglobin: 11.6 g/dL — ABNORMAL LOW (ref 11.7–15.5)
Lymphs Abs: 2023 cells/uL (ref 850–3900)
MCH: 28 pg (ref 27.0–33.0)
MCHC: 31.9 g/dL — ABNORMAL LOW (ref 32.0–36.0)
MCV: 87.7 fL (ref 80.0–100.0)
MPV: 9.5 fL (ref 7.5–12.5)
Monocytes Relative: 9.3 %
Neutro Abs: 4137 cells/uL (ref 1500–7800)
Neutrophils Relative %: 59.1 %
Platelets: 340 10*3/uL (ref 140–400)
RBC: 4.15 10*6/uL (ref 3.80–5.10)
RDW: 11.5 % (ref 11.0–15.0)
Total Lymphocyte: 28.9 %
WBC: 7 10*3/uL (ref 3.8–10.8)

## 2020-09-07 LAB — TSH: TSH: 2.75 mIU/L (ref 0.40–4.50)

## 2020-09-07 LAB — URINALYSIS, ROUTINE W REFLEX MICROSCOPIC
Bacteria, UA: NONE SEEN /HPF
Bilirubin Urine: NEGATIVE
Glucose, UA: NEGATIVE
Hyaline Cast: NONE SEEN /LPF
Ketones, ur: NEGATIVE
Nitrite: NEGATIVE
Protein, ur: NEGATIVE
Specific Gravity, Urine: 1.018 (ref 1.001–1.035)
pH: 5.5 (ref 5.0–8.0)

## 2020-09-07 LAB — MICROALBUMIN / CREATININE URINE RATIO
Creatinine, Urine: 151 mg/dL (ref 20–275)
Microalb Creat Ratio: 18 mcg/mg creat (ref <30)
Microalb, Ur: 2.7 mg/dL

## 2020-09-07 LAB — MICROSCOPIC MESSAGE

## 2020-09-07 LAB — MAGNESIUM: Magnesium: 2.4 mg/dL (ref 1.5–2.5)

## 2020-09-07 LAB — INSULIN, RANDOM: Insulin: 6.9 u[IU]/mL

## 2020-09-07 LAB — HEMOGLOBIN A1C
Hgb A1c MFr Bld: 5.3 % of total Hgb (ref <5.7)
Mean Plasma Glucose: 105 mg/dL
eAG (mmol/L): 5.8 mmol/L

## 2020-09-07 LAB — VITAMIN D 25 HYDROXY (VIT D DEFICIENCY, FRACTURES): Vit D, 25-Hydroxy: 89 ng/mL (ref 30–100)

## 2020-09-07 MED ORDER — NITROFURANTOIN MONOHYD MACRO 100 MG PO CAPS: ORAL_CAPSULE | ORAL | 0 refills | Status: DC

## 2020-09-07 NOTE — Addendum Note (Signed)
Addended by: Unk Pinto on: 09/07/2020 12:07 PM   Modules accepted: Orders

## 2020-09-07 NOTE — Progress Notes (Signed)
============================================================ -   Test results slightly outside the reference range are not unusual. If there is anything important, I will review this with you,  otherwise it is considered normal test values.  If you have further questions,  please do not hesitate to contact me at the office or via My Chart.  ============================================================ ============================================================  -  CBC shows very mild Anemia or drop in Red Blood Cell Hgb count.   - So suggest call office to schedule lab to recheck CBC in about a month.  ============================================================ ============================================================  -  Total Chol = 159  and LDL Chol = 69 - .both  Excellent   - Very low risk for Heart Attack  / Stroke ========================================================  - A1c = 5.3% - back down & well in  normal  non Diabetic range.   ============================================================ ============================================================  -  Vitamin D = 89 - Excellent  ============================================================ ============================================================  -  U/A shows a UTI , So sent in Rx for Antibiotic to Drug store.   - Please call office to schedule a Nurse visit to recheck Urine in about 4 weeks   - Also recommend that you restart your Vitamin C 1,000 mg  2 x /day to   acidify your urine to prevent Urine infections ============================================================ ============================================================  -  Vitamin D = Excellent   ============================================================ ============================================================  - All Else - Kidneys - Electrolytes - Liver - Magnesium & Thyroid    - all  Normal /  OK ============================================================ ============================================================

## 2020-09-07 NOTE — Telephone Encounter (Signed)
Faxed a request for a follow up colonoscopy, that was due in [phone]).

## 2020-09-13 ENCOUNTER — Other Ambulatory Visit: Payer: Self-pay | Admitting: Internal Medicine

## 2020-09-14 NOTE — Addendum Note (Signed)
Addended by: Unk Pinto on: 09/14/2020 01:38 AM   Modules accepted: Orders

## 2020-09-22 ENCOUNTER — Ambulatory Visit
Admission: RE | Admit: 2020-09-22 | Discharge: 2020-09-22 | Disposition: A | Discharge status: Home / Self Care | Payer: Medicare PPO | Source: Ambulatory Visit

## 2020-09-22 ENCOUNTER — Other Ambulatory Visit: Payer: Self-pay

## 2020-09-22 DIAGNOSIS — Z1231 Encounter for screening mammogram for malignant neoplasm of breast: Secondary | ICD-10-CM | POA: Diagnosis not present

## 2020-09-23 DIAGNOSIS — L814 Other melanin hyperpigmentation: Secondary | ICD-10-CM | POA: Diagnosis not present

## 2020-09-23 DIAGNOSIS — Z85828 Personal history of other malignant neoplasm of skin: Secondary | ICD-10-CM | POA: Diagnosis not present

## 2020-09-23 DIAGNOSIS — D229 Melanocytic nevi, unspecified: Secondary | ICD-10-CM | POA: Diagnosis not present

## 2020-09-23 DIAGNOSIS — L905 Scar conditions and fibrosis of skin: Secondary | ICD-10-CM | POA: Diagnosis not present

## 2020-09-27 ENCOUNTER — Other Ambulatory Visit: Payer: Self-pay | Admitting: Internal Medicine

## 2020-09-27 ENCOUNTER — Ambulatory Visit
Admission: RE | Admit: 2020-09-27 | Discharge: 2020-09-27 | Disposition: A | Discharge status: Home / Self Care | Payer: No Typology Code available for payment source | Source: Ambulatory Visit | Attending: Internal Medicine | Admitting: Internal Medicine

## 2020-09-27 DIAGNOSIS — R931 Abnormal findings on diagnostic imaging of heart and coronary circulation: Secondary | ICD-10-CM

## 2020-09-27 DIAGNOSIS — I10 Essential (primary) hypertension: Secondary | ICD-10-CM

## 2020-09-27 DIAGNOSIS — I700 Atherosclerosis of aorta: Secondary | ICD-10-CM

## 2020-09-27 DIAGNOSIS — E782 Mixed hyperlipidemia: Secondary | ICD-10-CM

## 2020-09-27 DIAGNOSIS — Z136 Encounter for screening for cardiovascular disorders: Secondary | ICD-10-CM

## 2020-09-27 DIAGNOSIS — Z8673 Personal history of transient ischemic attack (TIA), and cerebral infarction without residual deficits: Secondary | ICD-10-CM

## 2020-09-27 DIAGNOSIS — Z87891 Personal history of nicotine dependence: Secondary | ICD-10-CM

## 2020-09-27 NOTE — Progress Notes (Signed)
-    Coronary Artery calcium scores  appear elevated,                                        so as discussed will submit for Cardiology  consultation

## 2020-09-27 NOTE — Progress Notes (Signed)
============================================================ ============================================================  -    Coronary Artery calcium scores  appear elevated,                                       so as discussed will submit for Cardiology consultation  ============================================================ ============================================================

## 2020-10-05 ENCOUNTER — Ambulatory Visit: Payer: Medicare PPO | Admitting: Adult Health Nurse Practitioner

## 2020-10-07 ENCOUNTER — Other Ambulatory Visit: Payer: Self-pay

## 2020-10-07 ENCOUNTER — Ambulatory Visit: Payer: Medicare PPO

## 2020-10-07 VITALS — BP 120/80 | HR 93 | Temp 97.3°F | Resp 17 | Ht 66.0 in | Wt 141.8 lb

## 2020-10-07 DIAGNOSIS — R319 Hematuria, unspecified: Secondary | ICD-10-CM | POA: Diagnosis not present

## 2020-10-07 DIAGNOSIS — D649 Anemia, unspecified: Secondary | ICD-10-CM | POA: Diagnosis not present

## 2020-10-07 DIAGNOSIS — N390 Urinary tract infection, site not specified: Secondary | ICD-10-CM | POA: Diagnosis not present

## 2020-10-07 DIAGNOSIS — Z1211 Encounter for screening for malignant neoplasm of colon: Secondary | ICD-10-CM

## 2020-10-07 DIAGNOSIS — Z79899 Other long term (current) drug therapy: Secondary | ICD-10-CM

## 2020-10-08 LAB — URINALYSIS, ROUTINE W REFLEX MICROSCOPIC
Bacteria, UA: NONE SEEN /HPF
Bilirubin Urine: NEGATIVE
Glucose, UA: NEGATIVE
Hgb urine dipstick: NEGATIVE
Hyaline Cast: NONE SEEN /LPF
Ketones, ur: NEGATIVE
Leukocytes,Ua: NEGATIVE
Nitrite: NEGATIVE
Specific Gravity, Urine: 1.019 (ref 1.001–1.035)
Squamous Epithelial / HPF: NONE SEEN /HPF (ref <=5)
pH: 6 (ref 5.0–8.0)

## 2020-10-08 LAB — MICROSCOPIC MESSAGE

## 2020-10-08 LAB — CBC WITH DIFFERENTIAL/PLATELET
Absolute Monocytes: 667 cells/uL (ref 200–950)
Basophils Absolute: 92 cells/uL (ref 0–200)
Basophils Relative: 1.3 %
Eosinophils Absolute: 320 cells/uL (ref 15–500)
Eosinophils Relative: 4.5 %
HCT: 36.3 % (ref 35.0–45.0)
Hemoglobin: 11.4 g/dL — ABNORMAL LOW (ref 11.7–15.5)
Lymphs Abs: 2080 cells/uL (ref 850–3900)
MCH: 27.4 pg (ref 27.0–33.0)
MCHC: 31.4 g/dL — ABNORMAL LOW (ref 32.0–36.0)
MCV: 87.3 fL (ref 80.0–100.0)
MPV: 9.6 fL (ref 7.5–12.5)
Monocytes Relative: 9.4 %
Neutro Abs: 3941 cells/uL (ref 1500–7800)
Neutrophils Relative %: 55.5 %
Platelets: 323 10*3/uL (ref 140–400)
RBC: 4.16 10*6/uL (ref 3.80–5.10)
RDW: 12.4 % (ref 11.0–15.0)
Total Lymphocyte: 29.3 %
WBC: 7.1 10*3/uL (ref 3.8–10.8)

## 2020-10-08 LAB — URINE CULTURE
MICRO NUMBER:: 12043925
Result:: NO GROWTH
SPECIMEN QUALITY:: ADEQUATE

## 2020-10-09 NOTE — Progress Notes (Signed)
============================================================ -   Test results slightly outside the reference range are not unusual. If there is anything important, I will review this with you,  otherwise it is considered normal test values.  If you have further questions,  please do not hesitate to contact me at the office or via My Chart.  ============================================================ ============================================================  -  CBC shows mild anemia which is stable ============================================================ ============================================================  -  U/A is OK now - No Infection ============================================================ ============================================================

## 2020-10-28 DIAGNOSIS — D124 Benign neoplasm of descending colon: Secondary | ICD-10-CM | POA: Diagnosis not present

## 2020-10-28 DIAGNOSIS — D125 Benign neoplasm of sigmoid colon: Secondary | ICD-10-CM | POA: Diagnosis not present

## 2020-10-28 DIAGNOSIS — Z1211 Encounter for screening for malignant neoplasm of colon: Secondary | ICD-10-CM | POA: Diagnosis not present

## 2020-10-28 DIAGNOSIS — K648 Other hemorrhoids: Secondary | ICD-10-CM | POA: Diagnosis not present

## 2020-10-28 DIAGNOSIS — D123 Benign neoplasm of transverse colon: Secondary | ICD-10-CM | POA: Diagnosis not present

## 2020-10-28 DIAGNOSIS — K635 Polyp of colon: Secondary | ICD-10-CM | POA: Diagnosis not present

## 2020-10-28 DIAGNOSIS — Z86010 Personal history of colonic polyps: Secondary | ICD-10-CM | POA: Diagnosis not present

## 2020-10-28 LAB — HM COLONOSCOPY

## 2020-11-02 DIAGNOSIS — Z471 Aftercare following joint replacement surgery: Secondary | ICD-10-CM | POA: Diagnosis not present

## 2020-11-02 DIAGNOSIS — Z96651 Presence of right artificial knee joint: Secondary | ICD-10-CM | POA: Diagnosis not present

## 2020-11-16 ENCOUNTER — Encounter: Payer: Self-pay | Admitting: Internal Medicine

## 2020-11-29 DIAGNOSIS — M25561 Pain in right knee: Secondary | ICD-10-CM | POA: Diagnosis not present

## 2020-11-29 DIAGNOSIS — M1711 Unilateral primary osteoarthritis, right knee: Secondary | ICD-10-CM | POA: Diagnosis not present

## 2020-11-29 NOTE — Progress Notes (Signed)
Cardiology Office Note:    Date:  11/30/2020   ID:  Deborah [name], DOB [birth year], MRN [id number]  PCP:  Unk Pinto, MD  Cardiologist:  None  Electrophysiologist:  None   Referring MD: Unk Pinto, MD   Chief Complaint  Patient presents with   New Patient (Initial Visit)   Coronary Artery Disease    History of Present Illness:    Deborah [name] is a 75 y.o. female with a hx of possible TIA, migraines, squamous cell skin cancer, hyperlipidemia who is referred by Dr. Melford Aase for evaluation of elevated calcium score.  Calcium score on 09/27/2020 was 1368 (97th percentile).  She denies any chest pain, dyspnea, lower extremity edema, or palpitations.  Does report she has been having intermittent lightheadedness that occurs when she stands.  No syncopal episodes she does not exercise due to knee pain.  Most exertion is walking around the house.  She started smoking at age 19, quit intermittently, then for good around age 83.  Was smoking up to 0.5 packs/day.  Family history includes father had MI in 63s.    Past Medical History:  Diagnosis Date   Allergy    Anemia    Anxiety    Aortic atherosclerosis ([place]) 11/2016   Noted on CT abd   Arthritis    hands and knees   Bilateral cataracts    History of   Cellulitis    Right finger   Cerebral amyloid angiopathy (CODE)    Cervical spondylosis 12/2008   Noted on Cervical spine   Chronic nausea    Compression fracture of T12 vertebra ([place]) 06/07/2017   Mild   DDD (degenerative disc disease), cervical 12/2008   Noted on Cervical spine   Delusions ([place]) 03/24/2018   Depression    Encephalopathy 01/02/2017   Mild, [facility]   GERD (gastroesophageal reflux disease)    Helicobacter pylori infection 2018   History of appendicitis    age 67   History of colon polyps    History of confusion    History of hiatal hernia 10/16/2016   Noted on Endoscopy   Hyperlipidemia    Incontinence in female 08/06/2014   Internal  hemorrhoid 08/02/2017   Noted on Colonoscopy   Low blood pressure reading    Migraines    Osteopenia    PONV (postoperative nausea and vomiting)    Pre-diabetes    RLS (restless legs syndrome)    Seizure ([place])    atypical partial comples vs. confusional migraines   Seizure disorder ([place]) 12/15/2016   Skin cancer    right lower eyelid   Squamous cell skin cancer    history of neck and upper back   Syncope 03/25/2018   TIA (transient ischemic attack)    questionable per patient   Vitamin D deficiency     Past Surgical History:  Procedure Laterality Date   APPENDECTOMY  1959   CATARACT EXTRACTION, BILATERAL Bilateral 2014   CESAREAN SECTION  1978   COLONOSCOPY W/ POLYPECTOMY  08/02/2017   KNEE ARTHROSCOPY Bilateral 04/2014   [facility]   MOHS SURGERY Right 04/2013   Squamous cell cacinoma, neck/upper back   SPINE SURGERY  2010   C6-C7 fusion   TOTAL KNEE ARTHROPLASTY Left 03/22/2018   Procedure: LEFT TOTAL KNEE ARTHROPLASTY;  Surgeon: Dorna Leitz, MD;  Location: [facility];  Service: Orthopedics;  Laterality: Left;  Adductor Block   UPPER GI ENDOSCOPY  10/16/2016    Current Medications: Current Meds  Medication Sig  alendronate (FOSAMAX) 70 MG tablet Take      1 tablet      every 7 days       with a full glass of water       on an empty stomach       for 60 minutes   ALPRAZolam (XANAX) 0.5 MG tablet TAKE 1/2-1 TABLET 2-3 TIMES A DAY ONLY IF NEEDED FOR ANXIETY ATTACK AND LIMIT TO 5 DAYS A WEEK TO AVOID ADDICTION   busPIRone (BUSPAR) 10 MG tablet Take  1 tablet  3 x /day  for Anxiety   Cholecalciferol (VITAMIN D3) 125 MCG (5000 UT) CAPS Take 5,000 Units by mouth daily.   escitalopram (LEXAPRO) 10 MG tablet Take 1 tablet (10 mg total) by mouth daily.   ezetimibe (ZETIA) 10 MG tablet TAKE 1 TABLET BY MOUTH ONCE DAILY FOR CHOLESTEROL   meloxicam (MOBIC) 15 MG tablet Take  1/2 to 1 tablet  Daily  with Food  for Pain & Inflammation   Multiple Vitamins-Minerals (MULTIVITAMIN PO)  Take 1 tablet by mouth daily.   nitrofurantoin, macrocrystal-monohydrate, (MACROBID) 100 MG capsule Take 1 capsule  2 x /day with Food for UTI   SUMAtriptan (IMITREX) 20 MG/ACT nasal spray USE ONE DOSE IN THE NOSE NOW, MAY REPEAT IN 2 HOURS. MAX OF 2 DOSES IN 24 HOURS. (Patient taking differently: Place 20 mg into the nose every 2 (two) hours as needed for migraine.)     Allergies:   Banana, Chocolate, Effexor [venlafaxine], Maxalt [rizatriptan benzoate], Other, Tussin [guaifenesin], and Zostavax [zoster vaccine live]   Social History   Socioeconomic History   Marital status: Married    Spouse name: Not on file   Number of children: Not on file   Years of education: Not on file   Highest education level: Not on file  Occupational History   Not on file  Tobacco Use   Smoking status: Former    Packs/day: 1.00    Years: 4.00    Pack years: 4.00    Types: Cigarettes    Quit date: 03/25/1986    Years since quitting: 34.7   Smokeless tobacco: Never  Vaping Use   Vaping Use: Never used  Substance and Sexual Activity   Alcohol use: Not Currently    Comment: occasional   Drug use: No   Sexual activity: Not on file  Other Topics Concern   Not on file  Social History Narrative   Not on file   Social Determinants of Health   Financial Resource Strain: Not on file  Food Insecurity: Not on file  Transportation Needs: Not on file  Physical Activity: Not on file  Stress: Not on file  Social Connections: Not on file     Family History: The patient's family history includes Diabetes in her mother; Heart disease in her father and mother; Hyperlipidemia in her sister; Hypertension in her brother and father; Stroke in her father.  ROS:   Please see the history of present illness.     All other systems reviewed and are negative.  EKGs/Labs/Other Studies Reviewed:    The following studies were reviewed today:   EKG:  EKG is ordered today.  The ekg ordered today demonstrates normal  sinus rhythm, rate 81, biatrial enlargement, no ST abnormalities   Recent Labs: 09/06/2020: ALT 10; BUN 13; Creat 0.87; Magnesium 2.4; Potassium 4.7; Sodium 134; TSH 2.75 10/07/2020: Hemoglobin 11.4; Platelets 323  Recent Lipid Panel    Component Value Date/Time   CHOL  159 09/06/2020 1403   TRIG 125 09/06/2020 1403   HDL 69 09/06/2020 1403   CHOLHDL 2.3 09/06/2020 1403   VLDL 15 12/16/2016 0613   LDLCALC 69 09/06/2020 1403    Physical Exam:    VS:  BP 104/76 (BP Location: Left Arm, Patient Position: Sitting, Cuff Size: Normal)   Pulse 81   Ht 5' 6.5" (1.689 m)   Wt 144 lb (65.3 kg)   BMI 22.89 kg/m     Wt Readings from Last 3 Encounters:  11/30/20 144 lb (65.3 kg)  10/07/20 141 lb 12.8 oz (64.3 kg)  09/06/20 141 lb 9.6 oz (64.2 kg)     GEN:  Well nourished, well developed in no acute distress HEENT: Normal NECK: No JVD; No carotid bruits LYMPHATICS: No lymphadenopathy CARDIAC: RRR, no murmurs, rubs, gallops RESPIRATORY:  Clear to auscultation without rales, wheezing or rhonchi  ABDOMEN: Soft, non-tender, non-distended MUSCULOSKELETAL:  No edema; No deformity  SKIN: Warm and dry NEUROLOGIC:  Alert and oriented x 3 PSYCHIATRIC:  Normal affect   ASSESSMENT:    1. Elevated coronary artery calcium score   2. Abnormal EKG   3. Lightheadedness   4. Hyperlipidemia, unspecified hyperlipidemia type    PLAN:    CAD: Calcium score on 09/27/2020 was 1368 (97th percentile).   -Continue rosuvastatin 20 mg daily -Start aspirin 81 mg daily -Recommend Lexiscan Myoview to evaluate for ischemia  Lightheadedness: Reports intermittent lightheadedness.  EKG with biatrial enlargement.  Check echocardiogram to evaluate for structural heart disease  Hyperlipidemia: On rosuvastatin 20 mg daily and Zetia 10 mg daily.  Most recent LDL 69 09/06/20  RTC in 6 months  Shared Decision Making/Informed Consent The risks [chest pain, shortness of breath, cardiac arrhythmias, dizziness, blood  pressure fluctuations, myocardial infarction, stroke/transient ischemic attack, nausea, vomiting, allergic reaction, radiation exposure, metallic taste sensation and life-threatening complications (estimated to be 1 in 10,000)], benefits (risk stratification, diagnosing coronary artery disease, treatment guidance) and alternatives of a nuclear stress test were discussed in detail with Ms. Gradney and she agrees to proceed.    Medication Adjustments/Labs and Tests Ordered: Current medicines are reviewed at length with the patient today.  Concerns regarding medicines are outlined above.  Orders Placed This Encounter  Procedures   MYOCARDIAL PERFUSION IMAGING   EKG 12-Lead   ECHOCARDIOGRAM COMPLETE    Meds ordered this encounter  Medications   aspirin EC 81 MG tablet    Sig: Take 1 tablet (81 mg total) by mouth daily.    Dispense:  30 tablet    Refill:  11     Patient Instructions  Medication Instructions:  Restart aspirin 81 mg daily  *If you need a refill on your cardiac medications before your next appointment, please call your pharmacy*  Testing/Procedures: Your physician has requested that you have a Lexicographer at [facility]. For further information please visit [email or web address]. Please follow instruction sheet, as given.   How to prepare for your Myocardial Perfusion Test: Do not eat or drink 3 hours prior to your test, except you may have water. Do not consume products containing caffeine (regular or decaffeinated) 12 hours prior to your test. (ex: coffee, chocolate, sodas, tea). Do bring a list of your current medications with you.  If not listed below, you may take your medications as normal. Do wear comfortable clothes (no dresses or overalls) and walking shoes, tennis shoes preferred (No heels or open toe shoes are allowed). Do NOT wear cologne, perfume, aftershave, or lotions (deodorant is  allowed). The test will take approximately 3 to 4 hours to complete If  these instructions are not followed, your test will have to be rescheduled.  Your physician has requested that you have an echocardiogram. Echocardiography is a painless test that uses sound waves to create images of your heart. It provides your doctor with information about the size and shape of your heart and how well your heart's chambers and valves are working. This procedure takes approximately one hour. There are no restrictions for this procedure.  Follow-Up: At [facility], you and your health needs are our priority.  As part of our continuing mission to provide you with exceptional heart care, we have created designated Provider Care Teams.  These Care Teams include your primary Cardiologist (physician) and Advanced Practice Providers (APPs -  Physician Assistants and Nurse Practitioners) who all work together to provide you with the care you need, when you need it.  We recommend signing up for the patient portal called "MyChart".  Sign up information is provided on this After Visit Summary.  MyChart is used to connect with patients for Virtual Visits (Telemedicine).  Patients are able to view lab/test results, encounter notes, upcoming appointments, etc.  Non-urgent messages can be sent to your provider as well.   To learn more about what you can do with MyChart, go to [email or web address].    Your next appointment:   6 month(s)  The format for your next appointment:   In Person  Provider:   Oswaldo Milian, MD    Signed, Donato Heinz, MD  11/30/2020 10:00 AM    [place]

## 2020-11-30 ENCOUNTER — Ambulatory Visit: Payer: Medicare PPO | Admitting: Cardiology

## 2020-11-30 ENCOUNTER — Encounter: Payer: Self-pay | Admitting: Cardiology

## 2020-11-30 ENCOUNTER — Other Ambulatory Visit: Payer: Self-pay

## 2020-11-30 VITALS — BP 104/76 | HR 81 | Ht 66.5 in | Wt 144.0 lb

## 2020-11-30 DIAGNOSIS — R42 Dizziness and giddiness: Secondary | ICD-10-CM

## 2020-11-30 DIAGNOSIS — R931 Abnormal findings on diagnostic imaging of heart and coronary circulation: Secondary | ICD-10-CM

## 2020-11-30 DIAGNOSIS — R9431 Abnormal electrocardiogram [ECG] [EKG]: Secondary | ICD-10-CM | POA: Diagnosis not present

## 2020-11-30 DIAGNOSIS — E785 Hyperlipidemia, unspecified: Secondary | ICD-10-CM

## 2020-11-30 MED ORDER — ASPIRIN EC 81 MG PO TBEC: 81.0000 mg | DELAYED_RELEASE_TABLET | Freq: Every day | ORAL | 11 refills | Status: AC

## 2020-11-30 NOTE — Patient Instructions (Signed)
Medication Instructions:  Restart aspirin 81 mg daily  *If you need a refill on your cardiac medications before your next appointment, please call your pharmacy*  Testing/Procedures: Your physician has requested that you have a Lexicographer at [facility]. For further information please visit [email or web address]. Please follow instruction sheet, as given.   How to prepare for your Myocardial Perfusion Test: Do not eat or drink 3 hours prior to your test, except you may have water. Do not consume products containing caffeine (regular or decaffeinated) 12 hours prior to your test. (ex: coffee, chocolate, sodas, tea). Do bring a list of your current medications with you.  If not listed below, you may take your medications as normal. Do wear comfortable clothes (no dresses or overalls) and walking shoes, tennis shoes preferred (No heels or open toe shoes are allowed). Do NOT wear cologne, perfume, aftershave, or lotions (deodorant is allowed). The test will take approximately 3 to 4 hours to complete If these instructions are not followed, your test will have to be rescheduled.  Your physician has requested that you have an echocardiogram. Echocardiography is a painless test that uses sound waves to create images of your heart. It provides your doctor with information about the size and shape of your heart and how well your heart's chambers and valves are working. This procedure takes approximately one hour. There are no restrictions for this procedure.  Follow-Up: At [facility], you and your health needs are our priority.  As part of our continuing mission to provide you with exceptional heart care, we have created designated Provider Care Teams.  These Care Teams include your primary Cardiologist (physician) and Advanced Practice Providers (APPs -  Physician Assistants and Nurse Practitioners) who all work together to provide you with the care you need, when you need it.  We recommend  signing up for the patient portal called "MyChart".  Sign up information is provided on this After Visit Summary.  MyChart is used to connect with patients for Virtual Visits (Telemedicine).  Patients are able to view lab/test results, encounter notes, upcoming appointments, etc.  Non-urgent messages can be sent to your provider as well.   To learn more about what you can do with MyChart, go to [email or web address].    Your next appointment:   6 month(s)  The format for your next appointment:   In Person  Provider:   Oswaldo Milian, MD

## 2020-12-01 ENCOUNTER — Other Ambulatory Visit: Payer: Self-pay | Admitting: Internal Medicine

## 2020-12-02 ENCOUNTER — Telehealth: Payer: Self-pay

## 2020-12-02 NOTE — Telephone Encounter (Signed)
Detailed instructions left on the patient's answering machine. Asked to call back with any questions. S.Dara Camargo EMTP 

## 2020-12-07 ENCOUNTER — Ambulatory Visit (HOSPITAL_COMMUNITY): Payer: Medicare PPO | Attending: Cardiovascular Disease

## 2020-12-07 ENCOUNTER — Other Ambulatory Visit: Payer: Self-pay

## 2020-12-07 VITALS — Ht 66.0 in | Wt 144.0 lb

## 2020-12-07 DIAGNOSIS — R9431 Abnormal electrocardiogram [ECG] [EKG]: Secondary | ICD-10-CM | POA: Insufficient documentation

## 2020-12-07 DIAGNOSIS — R931 Abnormal findings on diagnostic imaging of heart and coronary circulation: Secondary | ICD-10-CM | POA: Diagnosis not present

## 2020-12-07 DIAGNOSIS — R110 Nausea: Secondary | ICD-10-CM | POA: Insufficient documentation

## 2020-12-07 LAB — MYOCARDIAL PERFUSION IMAGING
Base ST Depression (mm): 0 mm
LV dias vol: 51 mL (ref 46–106)
LV sys vol: 17 mL
MPHR: 146 {beats}/min
Nuc Stress EF: 67 %
Peak HR: 118 {beats}/min
Percent HR: 80.8 %
Rest HR: 79 {beats}/min
Rest Nuclear Isotope Dose: 11 mCi
SDS: 9
SRS: 0
SSS: 9
ST Depression (mm): 0 mm
Stress Nuclear Isotope Dose: 31.1 mCi
TID: 1.02

## 2020-12-07 MED ORDER — AMINOPHYLLINE 25 MG/ML IV SOLN
150.0000 mg | Freq: Once | INTRAVENOUS | Status: AC
  Administered 2020-12-07: 150 mg via INTRAVENOUS

## 2020-12-07 MED ORDER — TECHNETIUM TC 99M TETROFOSMIN IV KIT
31.1000 | PACK | Freq: Once | INTRAVENOUS | Status: AC | PRN
  Administered 2020-12-07: 31.1 via INTRAVENOUS
  Filled 2020-12-07: qty 32

## 2020-12-07 MED ORDER — TECHNETIUM TC 99M TETROFOSMIN IV KIT
11.0000 | PACK | Freq: Once | INTRAVENOUS | Status: AC | PRN
  Administered 2020-12-07: 11 via INTRAVENOUS
  Filled 2020-12-07: qty 11

## 2020-12-07 MED ORDER — REGADENOSON 0.4 MG/5ML IV SOLN
0.4000 mg | Freq: Once | INTRAVENOUS | Status: AC
  Administered 2020-12-07: 0.4 mg via INTRAVENOUS

## 2020-12-21 ENCOUNTER — Other Ambulatory Visit: Payer: Self-pay

## 2020-12-21 ENCOUNTER — Ambulatory Visit (HOSPITAL_COMMUNITY): Payer: Medicare PPO | Attending: Internal Medicine

## 2020-12-21 DIAGNOSIS — R9431 Abnormal electrocardiogram [ECG] [EKG]: Secondary | ICD-10-CM | POA: Insufficient documentation

## 2020-12-21 DIAGNOSIS — R42 Dizziness and giddiness: Secondary | ICD-10-CM | POA: Diagnosis not present

## 2020-12-21 LAB — ECHOCARDIOGRAM COMPLETE
Area-P 1/2: 4.06 cm2
S' Lateral: 2.4 cm

## 2020-12-21 NOTE — Progress Notes (Signed)
FOLLOW UP  Assessment and Plan:   Hypertension Well controlled without medications  Monitor blood pressure at home; patient to call if consistently greater than 130/80 Continue DASH diet.   Reminder to go to the ER if any CP, SOB, nausea, dizziness, severe HA, changes vision/speech, left arm numbness and tingling and jaw pain.  Cholesterol Currently at goal; Continue Zetia and Crestor Continue low cholesterol diet and exercise.  Check lipid panel.  CMP  Abnormal Glucose Continue diet and exercise.   Overweight Discussion about weight loss, diet, and exercise Recommended diet heavy in fruits and veggies and low in animal meats, cheeses, and dairy products, appropriate calorie intake  Anemia Continue Multivitamin CBC  Anxiety Continue Buspar and lexapro, behavior modifications and monitor symptoms Limit Xanax to only 5 days a week if needed for anxiety attack  Vitamin D Def At goal at last visit; continue supplementation to maintain goal of 60-100 Defer Vit D level  GERD Continue diet modifications and monitor symptoms  Migraines  Much better and controlled with behavior modification  Pain of upper abdomen  Pt is to monitor food intake to determine if certain foods affect symptoms, begin Activia daily  Chronic Pain of right Knee  Continue to follow with ortho , receiving cortisone injections    Continue diet and meds as discussed. Further disposition pending results of labs. Discussed med's effects and SE's.   Over 30 minutes of exam, counseling, chart review, and critical decision making was performed.   Future Appointments  Date Time Provider [place]  06/08/2021 10:20 AM Donato Heinz, MD [facility]  06/29/2021  4:00 PM Liane Comber, NP [facility] None  09/06/2021  3:00 PM Unk Pinto, MD [facility] None     ----------------------------------------------------------------------------------------------------------------------  HPI 75 y.o. female  presents for 3 month follow up on hypertension, cholesterol, diabetes, weight and vitamin D deficiency.   BMI is Body mass index is 23.37 kg/m., she has been working on diet and exercise. Right knee is painful , popping/clicking, worse with going up steps.  Had cortisone shot 2 months ago.  Wt Readings from Last 3 Encounters:  12/22/20 144 lb 12.8 oz (65.7 kg)  12/07/20 144 lb (65.3 kg)  11/30/20 144 lb (65.3 kg)    Her blood pressure has been controlled at home, today their BP is BP: 120/76  She does workout. She denies chest pain, shortness of breath, dizziness.   She is on cholesterol medication Rosuvastatin and denies myalgias. Her cholesterol is at goal. The cholesterol last visit was:   Lab Results  Component Value Date   CHOL 159 09/06/2020   HDL 69 09/06/2020   LDLCALC 69 09/06/2020   TRIG 125 09/06/2020   CHOLHDL 2.3 09/06/2020    She has been working on diet and exercise for prediabetes Last A1C in the office was:  Lab Results  Component Value Date   HGBA1C 5.3 09/06/2020   Patient is on Vitamin D supplement.   Lab Results  Component Value Date   VD25OH 89 09/06/2020     Osteoporosis on DEXA 01/2020, currently on Fosamax.   Current Medications:  Current Outpatient Medications on File Prior to Visit  Medication Sig   alendronate (FOSAMAX) 70 MG tablet Take      1 tablet      every 7 days       with a full glass of water       on an empty stomach       for 60 minutes  ALPRAZolam (XANAX) 0.5 MG tablet TAKE 1/2-1 TABLET BY MOUTH 2-3 TIMES A DAY ONLY IF NEEDED FOR ANXIETY ATTACK AND LIMIT TO 5 DAYS A WEEK TO AVOID ADDICTION   aspirin EC 81 MG tablet Take 1 tablet (81 mg total) by mouth daily.   busPIRone (BUSPAR) 10 MG tablet Take  1 tablet  3 x /day  for Anxiety   Cholecalciferol (VITAMIN D3) 125 MCG (5000 UT) CAPS Take  5,000 Units by mouth daily.   escitalopram (LEXAPRO) 10 MG tablet Take 1 tablet (10 mg total) by mouth daily.   ezetimibe (ZETIA) 10 MG tablet TAKE 1 TABLET BY MOUTH ONCE DAILY FOR CHOLESTEROL   Multiple Vitamins-Minerals (MULTIVITAMIN PO) Take 1 tablet by mouth daily.   rosuvastatin (CRESTOR) 20 MG tablet TAKE 1 TABLET BY MOUTH ONCE DAILY FOR CHOLESTEROL   SUMAtriptan (IMITREX) 20 MG/ACT nasal spray USE ONE DOSE IN THE NOSE NOW, MAY REPEAT IN 2 HOURS. MAX OF 2 DOSES IN 24 HOURS. (Patient taking differently: Place 20 mg into the nose every 2 (two) hours as needed for migraine.)   Ascorbic Acid (VITAMIN C) 1000 MG tablet Take 1,000 mg by mouth daily. (Patient not taking: Reported on 11/30/2020)   azelastine (ASTELIN) 0.1 % nasal spray Place 2 sprays into both nostrils 2 (two) times daily. Use in each nostril as directed (Patient not taking: No sig reported)   meloxicam (MOBIC) 15 MG tablet Take  1/2 to 1 tablet  Daily  with Food  for Pain & Inflammation (Patient not taking: Reported on 12/22/2020)   nitrofurantoin, macrocrystal-monohydrate, (MACROBID) 100 MG capsule Take 1 capsule  2 x /day with Food for UTI   No current facility-administered medications on file prior to visit.     Allergies:  Allergies  Allergen Reactions   Banana Nausea And Vomiting   Chocolate Other (See Comments)    Unknown   Effexor [Venlafaxine] Other (See Comments)    insomnia   Maxalt [Rizatriptan Benzoate] Nausea Only and Other (See Comments)    Nausea and excessive salivation   Other Other (See Comments)    NUTS (all kinds)   Tussin [Guaifenesin] Rash   Zostavax [Zoster Vaccine Live] Itching and Rash     Medical History:  Past Medical History:  Diagnosis Date   Allergy    Anemia    Anxiety    Aortic atherosclerosis ([place]) 11/2016   Noted on CT abd   Arthritis    hands and knees   Bilateral cataracts    History of   Cellulitis    Right finger   Cerebral amyloid angiopathy (CODE)    Cervical  spondylosis 12/2008   Noted on Cervical spine   Chronic nausea    Compression fracture of T12 vertebra ([facility]) 06/07/2017   Mild   DDD (degenerative disc disease), cervical 12/2008   Noted on Cervical spine   Delusions ([place]) 03/24/2018   Depression    Encephalopathy 01/02/2017   Mild, [place] [facility]   GERD (gastroesophageal reflux disease)    Helicobacter pylori infection 2018   History of appendicitis    age 73   History of colon polyps    History of confusion    History of hiatal hernia 10/16/2016   Noted on Endoscopy   Hyperlipidemia    Incontinence in female 08/06/2014   Internal hemorrhoid 08/02/2017   Noted on Colonoscopy   Low blood pressure reading    Migraines    Osteopenia    PONV (postoperative nausea and vomiting)  Pre-diabetes    RLS (restless legs syndrome)    Seizure ([facility])    atypical partial comples vs. confusional migraines   Seizure disorder ([place]) 12/15/2016   Skin cancer    right lower eyelid   Squamous cell skin cancer    history of neck and upper back   Syncope 03/25/2018   TIA (transient ischemic attack)    questionable per patient   Vitamin D deficiency    Family history- Reviewed and unchanged Social history- Reviewed and unchanged   Review of Systems:  Review of Systems  Constitutional:  Negative for chills, fever and weight loss.  HENT:  Negative for congestion and hearing loss.   Eyes:  Negative for blurred vision and double vision.  Respiratory:  Negative for cough and shortness of breath.   Cardiovascular:  Negative for chest pain, palpitations, orthopnea and leg swelling.  Gastrointestinal:  Positive for abdominal pain (mid abdominal area with occ nausea). Negative for constipation, diarrhea, heartburn, nausea and vomiting.  Genitourinary:  Negative for dysuria.  Musculoskeletal:  Negative for falls, joint pain and myalgias.  Skin:  Negative for rash.  Neurological:  Negative for dizziness, tingling, tremors, loss of consciousness and  headaches.  Psychiatric/Behavioral:  Negative for depression, memory loss and suicidal ideas.      Physical Exam: BP 120/76   Pulse 79   Temp (!) 97.3 F (36.3 C)   Wt 144 lb 12.8 oz (65.7 kg)   SpO2 99%   BMI 23.37 kg/m  Wt Readings from Last 3 Encounters:  12/22/20 144 lb 12.8 oz (65.7 kg)  12/07/20 144 lb (65.3 kg)  11/30/20 144 lb (65.3 kg)   General Appearance: Well nourished, in no apparent distress. Eyes: PERRLA, EOMs, conjunctiva no swelling or erythema Sinuses: No Frontal/maxillary tenderness ENT/Mouth: Ext aud canals clear, TMs without erythema, bulging. No erythema, swelling, or exudate on post pharynx.  Tonsils not swollen or erythematous. Hearing normal.  Neck: Supple, thyroid normal.  Respiratory: Respiratory effort normal, BS equal bilaterally without rales, rhonchi, wheezing or stridor.  Cardio: RRR with no MRGs. Brisk peripheral pulses without edema.  Abdomen: Soft, + BS.  Non tender, no guarding, rebound, hernias, masses. Unable to reproduce abdominal pain Lymphatics: Non tender without lymphadenopathy.  Musculoskeletal: Full ROM, 5/5 strength, Normal gait Skin: Warm, dry without rashes, lesions, ecchymosis.  Neuro: Cranial nerves intact. No cerebellar symptoms.  Psych: Awake and oriented X 3, normal affect, Insight and Judgment appropriate.    Magda Bernheim, NP 9:47 AM [facility]

## 2020-12-22 ENCOUNTER — Encounter: Payer: Self-pay | Admitting: Nurse Practitioner

## 2020-12-22 ENCOUNTER — Ambulatory Visit: Payer: Medicare PPO | Admitting: Nurse Practitioner

## 2020-12-22 VITALS — BP 120/76 | HR 79 | Temp 97.3°F | Wt 144.8 lb

## 2020-12-22 DIAGNOSIS — E559 Vitamin D deficiency, unspecified: Secondary | ICD-10-CM | POA: Diagnosis not present

## 2020-12-22 DIAGNOSIS — I10 Essential (primary) hypertension: Secondary | ICD-10-CM | POA: Diagnosis not present

## 2020-12-22 DIAGNOSIS — F411 Generalized anxiety disorder: Secondary | ICD-10-CM | POA: Diagnosis not present

## 2020-12-22 DIAGNOSIS — D649 Anemia, unspecified: Secondary | ICD-10-CM | POA: Diagnosis not present

## 2020-12-22 DIAGNOSIS — R7309 Other abnormal glucose: Secondary | ICD-10-CM | POA: Diagnosis not present

## 2020-12-22 DIAGNOSIS — Z79899 Other long term (current) drug therapy: Secondary | ICD-10-CM | POA: Diagnosis not present

## 2020-12-22 DIAGNOSIS — M25561 Pain in right knee: Secondary | ICD-10-CM | POA: Diagnosis not present

## 2020-12-22 DIAGNOSIS — K219 Gastro-esophageal reflux disease without esophagitis: Secondary | ICD-10-CM

## 2020-12-22 DIAGNOSIS — G8929 Other chronic pain: Secondary | ICD-10-CM

## 2020-12-22 DIAGNOSIS — E782 Mixed hyperlipidemia: Secondary | ICD-10-CM

## 2020-12-22 DIAGNOSIS — R1010 Upper abdominal pain, unspecified: Secondary | ICD-10-CM

## 2020-12-22 DIAGNOSIS — G43001 Migraine without aura, not intractable, with status migrainosus: Secondary | ICD-10-CM

## 2020-12-22 NOTE — Patient Instructions (Addendum)
Activia yogurt daily and monitor symptoms   GENERAL HEALTH GOALS   Know what a healthy weight is for you (roughly BMI <25) and aim to maintain this   Aim for 7+ servings of fruits and vegetables daily   70-80+ fluid ounces of water or unsweet tea for healthy kidneys   Limit to max 1 drink of alcohol per day; avoid smoking/tobacco   Limit animal fats in diet for cholesterol and heart health - choose grass fed whenever available   Avoid highly processed foods, and foods high in saturated/trans fats   Aim for low stress - take time to unwind and care for your mental health   Aim for 150 min of moderate intensity exercise weekly for heart health, and weights twice weekly for bone health   Aim for 7-9 hours of sleep daily

## 2020-12-23 LAB — LIPID PANEL
Cholesterol: 150 mg/dL (ref <200)
HDL: 71 mg/dL (ref >=50)
LDL Cholesterol (Calc): 63 mg/dL (calc)
Non-HDL Cholesterol (Calc): 79 mg/dL (calc) (ref <130)
Total CHOL/HDL Ratio: 2.1 (calc) (ref <5.0)
Triglycerides: 80 mg/dL (ref <150)

## 2020-12-23 LAB — MAGNESIUM: Magnesium: 2.3 mg/dL (ref 1.5–2.5)

## 2020-12-23 LAB — COMPLETE METABOLIC PANEL WITH GFR
AG Ratio: 1.5 (calc) (ref 1.0–2.5)
ALT: 12 U/L (ref 6–29)
AST: 19 U/L (ref 10–35)
Albumin: 4.1 g/dL (ref 3.6–5.1)
Alkaline phosphatase (APISO): 65 U/L (ref 37–153)
BUN: 17 mg/dL (ref 7–25)
CO2: 25 mmol/L (ref 20–32)
Calcium: 9.9 mg/dL (ref 8.6–10.4)
Chloride: 101 mmol/L (ref 98–110)
Creat: 0.83 mg/dL (ref 0.60–1.00)
Globulin: 2.8 g/dL (calc) (ref 1.9–3.7)
Glucose, Bld: 88 mg/dL (ref 65–99)
Potassium: 4.9 mmol/L (ref 3.5–5.3)
Sodium: 137 mmol/L (ref 135–146)
Total Bilirubin: 0.5 mg/dL (ref 0.2–1.2)
Total Protein: 6.9 g/dL (ref 6.1–8.1)
eGFR: 74 mL/min/{1.73_m2} (ref >=60)

## 2020-12-23 LAB — CBC WITH DIFFERENTIAL/PLATELET
Absolute Monocytes: 897 cells/uL (ref 200–950)
Basophils Absolute: 86 cells/uL (ref 0–200)
Basophils Relative: 1.1 %
Eosinophils Absolute: 203 cells/uL (ref 15–500)
Eosinophils Relative: 2.6 %
HCT: 37.7 % (ref 35.0–45.0)
Hemoglobin: 12.4 g/dL (ref 11.7–15.5)
Lymphs Abs: 1989 cells/uL (ref 850–3900)
MCH: 28.6 pg (ref 27.0–33.0)
MCHC: 32.9 g/dL (ref 32.0–36.0)
MCV: 87.1 fL (ref 80.0–100.0)
MPV: 9.5 fL (ref 7.5–12.5)
Monocytes Relative: 11.5 %
Neutro Abs: 4625 cells/uL (ref 1500–7800)
Neutrophils Relative %: 59.3 %
Platelets: 329 10*3/uL (ref 140–400)
RBC: 4.33 10*6/uL (ref 3.80–5.10)
RDW: 14.2 % (ref 11.0–15.0)
Total Lymphocyte: 25.5 %
WBC: 7.8 10*3/uL (ref 3.8–10.8)

## 2020-12-27 ENCOUNTER — Other Ambulatory Visit: Payer: Self-pay | Admitting: Adult Health Nurse Practitioner

## 2020-12-27 DIAGNOSIS — E782 Mixed hyperlipidemia: Secondary | ICD-10-CM

## 2021-01-25 ENCOUNTER — Other Ambulatory Visit: Payer: Self-pay | Admitting: Adult Health Nurse Practitioner

## 2021-01-25 DIAGNOSIS — F411 Generalized anxiety disorder: Secondary | ICD-10-CM

## 2021-02-09 ENCOUNTER — Other Ambulatory Visit: Payer: Self-pay | Admitting: Adult Health

## 2021-02-21 NOTE — Progress Notes (Deleted)
FOLLOW UP  Assessment and Plan:   Hypertension Well controlled with current medications  Monitor blood pressure at home; patient to call if consistently greater than 130/80 Continue DASH diet.   Reminder to go to the ER if any CP, SOB, nausea, dizziness, severe HA, changes vision/speech, left arm numbness and tingling and jaw pain.  Cholesterol Currently at goal;  Continue low cholesterol diet and exercise.  Check lipid panel.   Diabetes {with or without complications:30421263} Continue medication: Continue diet and exercise.  Perform daily foot/skin check, notify office of any concerning changes.  Check A1C  Obesity with co morbidities Long discussion about weight loss, diet, and exercise Recommended diet heavy in fruits and veggies and low in animal meats, cheeses, and dairy products, appropriate calorie intake Discussed ideal weight for height (below ***) and initial weight goal (***) Patient will work on *** Will follow up in 3 months  Vitamin D Def At goal at last visit; continue supplementation to maintain goal of 60-100 Defer Vit D level  Continue diet and meds as discussed. Further disposition pending results of labs. Discussed med's effects and SE's.   Over 30 minutes of exam, counseling, chart review, and critical decision making was performed.   Future Appointments  Date Time Provider [place]  02/22/2021 11:30 AM Magda Bernheim, NP [facility] None  06/08/2021 10:20 AM Donato Heinz, MD [facility]  06/29/2021  4:00 PM Liane Comber, NP [facility] None  09/06/2021  3:00 PM Unk Pinto, MD [facility] None    ----------------------------------------------------------------------------------------------------------------------  HPI 75 y.o. female  presents for 3 month follow up on hypertension, cholesterol, diabetes, weight and vitamin D deficiency.   BMI is There is no height or weight on file to calculate BMI., she {HAS HAS  JSH:70263} been working on diet and exercise. Wt Readings from Last 3 Encounters:  12/22/20 144 lb 12.8 oz (65.7 kg)  12/07/20 144 lb (65.3 kg)  11/30/20 144 lb (65.3 kg)    Her blood pressure has been controlled at home, today their BP is   BP Readings from Last 3 Encounters:  12/22/20 120/76  11/30/20 104/76  10/07/20 120/80     She {DOES_DOES ZCH:88502} workout. She denies chest pain, shortness of breath, dizziness.   She is on cholesterol medication Rosuvastatin , Zetia and denies myalgias. Her cholesterol is at goal. The cholesterol last visit was:   Lab Results  Component Value Date   CHOL 150 12/22/2020   HDL 71 12/22/2020   LDLCALC 63 12/22/2020   TRIG 80 12/22/2020   CHOLHDL 2.1 12/22/2020    She has been working on diet and exercise for abnormal glucose . Last A1C in the office was:  Lab Results  Component Value Date   HGBA1C 5.3 09/06/2020   Patient is on Vitamin D supplement.   Lab Results  Component Value Date   VD25OH 89 09/06/2020        Current Medications:  Current Outpatient Medications on File Prior to Visit  Medication Sig   alendronate (FOSAMAX) 70 MG tablet Take      1 tablet      every 7 days       with a full glass of water       on an empty stomach       for 60 minutes   ALPRAZolam (XANAX) 0.5 MG tablet TAKE 1/2 TO 1 TABLET BY MOUTH 2 TO 3 TIMES ONLY IF NEEDED FOR ANXIETY ATTACK AND LIMIT TO 5 DAYS A WEEK  TO AVOID ADDICTION   aspirin EC 81 MG tablet Take 1 tablet (81 mg total) by mouth daily.   busPIRone (BUSPAR) 10 MG tablet Take  1 tablet  3 x /day  as needed for Nerves                                          /         TAKE 1 TABLET BY MOUTH THREE TIMES DAILY   Cholecalciferol (VITAMIN D3) 125 MCG (5000 UT) CAPS Take 5,000 Units by mouth daily.   escitalopram (LEXAPRO) 10 MG tablet Take 1 tablet (10 mg total) by mouth daily.   ezetimibe (ZETIA) 10 MG tablet TAKE 1 TABLET BY MOUTH ONCE DAILY FOR CHOLESTEROL   Multiple Vitamins-Minerals  (MULTIVITAMIN PO) Take 1 tablet by mouth daily.   rosuvastatin (CRESTOR) 20 MG tablet TAKE 1 TABLET BY MOUTH ONCE DAILY FOR CHOLESTEROL   SUMAtriptan (IMITREX) 20 MG/ACT nasal spray USE ONE DOSE IN THE NOSE NOW, MAY REPEAT IN 2 HOURS. MAX OF 2 DOSES IN 24 HOURS. (Patient taking differently: Place 20 mg into the nose every 2 (two) hours as needed for migraine.)   No current facility-administered medications on file prior to visit.     Allergies:  Allergies  Allergen Reactions   Banana Nausea And Vomiting   Chocolate Other (See Comments)    Unknown   Effexor [Venlafaxine] Other (See Comments)    insomnia   Maxalt [Rizatriptan Benzoate] Nausea Only and Other (See Comments)    Nausea and excessive salivation   Other Other (See Comments)    NUTS (all kinds)   Tussin [Guaifenesin] Rash   Zostavax [Zoster Vaccine Live] Itching and Rash     Medical History:  Past Medical History:  Diagnosis Date   Allergy    Anemia    Anxiety    Aortic atherosclerosis ([place]) 11/2016   Noted on CT abd   Arthritis    hands and knees   Bilateral cataracts    History of   Cellulitis    Right finger   Cerebral amyloid angiopathy (CODE)    Cervical spondylosis 12/2008   Noted on Cervical spine   Chronic nausea    Compression fracture of T12 vertebra (HCC) 06/07/2017   Mild   DDD (degenerative disc disease), cervical 12/2008   Noted on Cervical spine   Delusions ([place]) 03/24/2018   Depression    Encephalopathy 01/02/2017   Mild, [place] EEG   GERD (gastroesophageal reflux disease)    Helicobacter pylori infection 2018   History of appendicitis    age 55   History of colon polyps    History of confusion    History of hiatal hernia 10/16/2016   Noted on Endoscopy   Hyperlipidemia    Incontinence in female 08/06/2014   Internal hemorrhoid 08/02/2017   Noted on Colonoscopy   Low blood pressure reading    Migraines    Osteopenia    PONV (postoperative nausea and vomiting)    Pre-diabetes     RLS (restless legs syndrome)    Seizure (HCC)    atypical partial comples vs. confusional migraines   Seizure disorder ([place]) 12/15/2016   Skin cancer    right lower eyelid   Squamous cell skin cancer    history of neck and upper back   Syncope 03/25/2018   TIA (transient ischemic attack)  questionable per patient   Vitamin D deficiency    Family history- Reviewed and unchanged Social history- Reviewed and unchanged   Review of Systems:  ROS    Physical Exam: There were no vitals taken for this visit. Wt Readings from Last 3 Encounters:  12/22/20 144 lb 12.8 oz (65.7 kg)  12/07/20 144 lb (65.3 kg)  11/30/20 144 lb (65.3 kg)   General Appearance: Well nourished, in no apparent distress. Eyes: PERRLA, EOMs, conjunctiva no swelling or erythema Sinuses: No Frontal/maxillary tenderness ENT/Mouth: Ext aud canals clear, TMs without erythema, bulging. No erythema, swelling, or exudate on post pharynx.  Tonsils not swollen or erythematous. Hearing normal.  Neck: Supple, thyroid normal.  Respiratory: Respiratory effort normal, BS equal bilaterally without rales, rhonchi, wheezing or stridor.  Cardio: RRR with no MRGs. Brisk peripheral pulses without edema.  Abdomen: Soft, + BS.  Non tender, no guarding, rebound, hernias, masses. Lymphatics: Non tender without lymphadenopathy.  Musculoskeletal: Full ROM, 5/5 strength, {PSY - GAIT AND STATION:22860} gait Skin: Warm, dry without rashes, lesions, ecchymosis.  Neuro: Cranial nerves intact. No cerebellar symptoms.  Psych: Awake and oriented X 3, normal affect, Insight and Judgment appropriate.    Magda Bernheim, NP 12:20 PM [facility]

## 2021-02-22 ENCOUNTER — Other Ambulatory Visit: Payer: Self-pay

## 2021-02-22 ENCOUNTER — Ambulatory Visit: Payer: Medicare PPO | Admitting: Internal Medicine

## 2021-02-22 ENCOUNTER — Ambulatory Visit: Payer: Medicare PPO | Admitting: Nurse Practitioner

## 2021-02-22 ENCOUNTER — Encounter: Payer: Self-pay | Admitting: Internal Medicine

## 2021-02-22 VITALS — BP 98/70 | HR 87 | Temp 97.6°F | Resp 16 | Ht 66.5 in | Wt 138.2 lb

## 2021-02-22 DIAGNOSIS — G459 Transient cerebral ischemic attack, unspecified: Secondary | ICD-10-CM

## 2021-02-22 DIAGNOSIS — G43109 Migraine with aura, not intractable, without status migrainosus: Secondary | ICD-10-CM | POA: Diagnosis not present

## 2021-02-22 DIAGNOSIS — I700 Atherosclerosis of aorta: Secondary | ICD-10-CM

## 2021-02-22 DIAGNOSIS — I10 Essential (primary) hypertension: Secondary | ICD-10-CM

## 2021-02-22 DIAGNOSIS — Z23 Encounter for immunization: Secondary | ICD-10-CM

## 2021-02-22 DIAGNOSIS — Z8673 Personal history of transient ischemic attack (TIA), and cerebral infarction without residual deficits: Secondary | ICD-10-CM

## 2021-02-22 DIAGNOSIS — E782 Mixed hyperlipidemia: Secondary | ICD-10-CM

## 2021-02-22 NOTE — Progress Notes (Signed)
Future Appointments  Date Time Provider [place]  06/08/2021 10:20 AM Donato Heinz, MD [facility]  06/29/2021  4:00 PM Liane Comber, NP [facility] None  09/06/2021  3:00 PM Unk Pinto, MD [facility] None    History of Present Illness:     Patient is brought in by her spouse today. Patient apparently had 2  periods of about 10 minutes difficulty word-finding / expressing herself over the last week. She had no other focal neuro signs or symptoms noted or reported.  She does have hx /o Migraine, HTN, preDM, & Vit D Deficiency.        In 2018, she had episodes of confusion dx'd as atypical seizure vs confusional Migraine and was treated with Topamax and then Keppra  which were later both d/c'd   in 2020 and she had done well until recently.     Medications  Current Outpatient Medications (Endocrine & Metabolic):    alendronate (FOSAMAX) 70 MG tablet, Take      1 tablet      every 7 days       with a full glass of water       on an empty stomach       for 60 minutes  Current Outpatient Medications (Cardiovascular):    ezetimibe (ZETIA) 10 MG tablet, TAKE 1 TABLET BY MOUTH ONCE DAILY FOR CHOLESTEROL   rosuvastatin (CRESTOR) 20 MG tablet, TAKE 1 TABLET BY MOUTH ONCE DAILY FOR CHOLESTEROL   Current Outpatient Medications (Analgesics):    aspirin EC 81 MG tablet, Take 1 tablet (81 mg total) by mouth daily.   SUMAtriptan (IMITREX) 20 MG/ACT nasal spray, USE ONE DOSE IN THE NOSE NOW, MAY REPEAT IN 2 HOURS. MAX OF 2 DOSES IN 24 HOURS. (Patient taking differently: Place 20 mg into the nose every 2 (two) hours as needed for migraine.)   Current Outpatient Medications (Other):    ALPRAZolam (XANAX) 0.5 MG tablet, TAKE 1/2 TO 1 TABLET BY MOUTH 2 TO 3 TIMES ONLY IF NEEDED FOR ANXIETY ATTACK AND LIMIT TO 5 DAYS A WEEK TO AVOID ADDICTION   busPIRone (BUSPAR) 10 MG tablet, Take  1 tablet  3 x /day  as needed for Nerves                                          /          TAKE 1 TABLET BY MOUTH THREE TIMES DAILY   Cholecalciferol (VITAMIN D3) 125 MCG (5000 UT) CAPS, Take 5,000 Units by mouth daily.   escitalopram (LEXAPRO) 10 MG tablet, Take 1 tablet (10 mg total) by mouth daily.   Multiple Vitamins-Minerals (MULTIVITAMIN PO), Take 1 tablet by mouth daily.  Problem list She has Hypertension; Hyperlipidemia; Vitamin D deficiency; Abnormal glucose; Medication management; Skin cancer; Migraine; Generalized anxiety disorder; BMI 24.0-24.9, adult; History of TIA (transient ischemic attack); Aortic atherosclerosis ([place]) by Abd CT scan  11/2016; Compression fracture of T12 vertebra (HCC); GERD (gastroesophageal reflux disease); Osteopenia; Primary osteoarthritis of left knee; and Syncope on their problem list.   Observations/Objective:  BP 98/70   Pulse 87   Temp 97.6 F (36.4 C)   Resp 16   Ht 5' 6.5" (1.689 m)   Wt 138 lb 3.2 oz (62.7 kg)   SpO2 97%   BMI 21.97 kg/m   HEENT - WNL. Neck - supple.  Chest - Clear equal BS. Cor - Nl HS. RRR w/o sig MGR. PP 1(+). No edema. MS- FROM w/o deformities.  Gait Nl. Neuro -  Nl w/o focal abnormalities.  Assessment and Plan:  1. TIA (transient ischemic attack)   2. Migraine equivalent   3. Need for immunization against influenza  - Flu vaccine HIGH DOSE PF (Fluzone High dose)    Follow Up Instructions:        I discussed the assessment and treatment plan with the patient and husband to just monitor for now unless develops further sx's.  They were provided an opportunity to ask questions and all were answered. They  agreed with the plan and demonstrated an understanding of the instructions.       They were  advised to call back or seek an in-person evaluation if the symptoms reoccur, worsen or if the condition fails to improve as anticipated.    Kirtland Bouchard, MD

## 2021-03-03 DIAGNOSIS — M25561 Pain in right knee: Secondary | ICD-10-CM | POA: Diagnosis not present

## 2021-03-09 DIAGNOSIS — J069 Acute upper respiratory infection, unspecified: Secondary | ICD-10-CM | POA: Diagnosis not present

## 2021-03-09 DIAGNOSIS — H6991 Unspecified Eustachian tube disorder, right ear: Secondary | ICD-10-CM | POA: Diagnosis not present

## 2021-04-16 ENCOUNTER — Observation Stay (HOSPITAL_COMMUNITY): Payer: Medicare PPO

## 2021-04-16 ENCOUNTER — Observation Stay (HOSPITAL_COMMUNITY)
Admission: EM | Admit: 2021-04-16 | Discharge: 2021-04-17 | Disposition: A | Discharge status: Home / Self Care | Payer: Medicare PPO | Attending: Internal Medicine | Admitting: Internal Medicine

## 2021-04-16 ENCOUNTER — Emergency Department (HOSPITAL_COMMUNITY): Payer: Medicare PPO

## 2021-04-16 ENCOUNTER — Other Ambulatory Visit: Payer: Self-pay

## 2021-04-16 DIAGNOSIS — Z96652 Presence of left artificial knee joint: Secondary | ICD-10-CM | POA: Diagnosis not present

## 2021-04-16 DIAGNOSIS — R479 Unspecified speech disturbances: Secondary | ICD-10-CM

## 2021-04-16 DIAGNOSIS — G459 Transient cerebral ischemic attack, unspecified: Principal | ICD-10-CM | POA: Insufficient documentation

## 2021-04-16 DIAGNOSIS — I771 Stricture of artery: Secondary | ICD-10-CM | POA: Diagnosis not present

## 2021-04-16 DIAGNOSIS — Z87891 Personal history of nicotine dependence: Secondary | ICD-10-CM | POA: Diagnosis not present

## 2021-04-16 DIAGNOSIS — I6522 Occlusion and stenosis of left carotid artery: Secondary | ICD-10-CM | POA: Diagnosis not present

## 2021-04-16 DIAGNOSIS — Z7982 Long term (current) use of aspirin: Secondary | ICD-10-CM | POA: Diagnosis not present

## 2021-04-16 DIAGNOSIS — Z85828 Personal history of other malignant neoplasm of skin: Secondary | ICD-10-CM | POA: Diagnosis not present

## 2021-04-16 DIAGNOSIS — R4701 Aphasia: Secondary | ICD-10-CM | POA: Diagnosis not present

## 2021-04-16 DIAGNOSIS — R7303 Prediabetes: Secondary | ICD-10-CM | POA: Diagnosis not present

## 2021-04-16 DIAGNOSIS — I10 Essential (primary) hypertension: Secondary | ICD-10-CM | POA: Diagnosis not present

## 2021-04-16 DIAGNOSIS — Z20822 Contact with and (suspected) exposure to covid-19: Secondary | ICD-10-CM | POA: Insufficient documentation

## 2021-04-16 DIAGNOSIS — Z79899 Other long term (current) drug therapy: Secondary | ICD-10-CM | POA: Insufficient documentation

## 2021-04-16 DIAGNOSIS — G9389 Other specified disorders of brain: Secondary | ICD-10-CM | POA: Diagnosis not present

## 2021-04-16 DIAGNOSIS — R4789 Other speech disturbances: Secondary | ICD-10-CM | POA: Diagnosis present

## 2021-04-16 DIAGNOSIS — G319 Degenerative disease of nervous system, unspecified: Secondary | ICD-10-CM | POA: Diagnosis not present

## 2021-04-16 LAB — COMPREHENSIVE METABOLIC PANEL
ALT: 15 U/L (ref 0–44)
AST: 24 U/L (ref 15–41)
Albumin: 3.7 g/dL (ref 3.5–5.0)
Alkaline Phosphatase: 66 U/L (ref 38–126)
Anion gap: 8 (ref 5–15)
BUN: 14 mg/dL (ref 8–23)
CO2: 26 mmol/L (ref 22–32)
Calcium: 9.2 mg/dL (ref 8.9–10.3)
Chloride: 100 mmol/L (ref 98–111)
Creatinine, Ser: 0.85 mg/dL (ref 0.44–1.00)
GFR, Estimated: >60 mL/min (ref >60)
Glucose, Bld: 103 mg/dL — ABNORMAL HIGH (ref 70–99)
Potassium: 4 mmol/L (ref 3.5–5.1)
Sodium: 134 mmol/L — ABNORMAL LOW (ref 135–145)
Total Bilirubin: 0.6 mg/dL (ref 0.3–1.2)
Total Protein: 6.7 g/dL (ref 6.5–8.1)

## 2021-04-16 LAB — RESP PANEL BY RT-PCR (FLU A&B, COVID) ARPGX2
Influenza A by PCR: NEGATIVE
Influenza B by PCR: NEGATIVE
SARS Coronavirus 2 by RT PCR: NEGATIVE

## 2021-04-16 LAB — APTT: aPTT: 25 seconds (ref 24–36)

## 2021-04-16 LAB — I-STAT CHEM 8, ED
BUN: 9 mg/dL (ref 8–23)
Calcium, Ion: 0.99 mmol/L — ABNORMAL LOW (ref 1.15–1.40)
Chloride: 112 mmol/L — ABNORMAL HIGH (ref 98–111)
Creatinine, Ser: 0.4 mg/dL — ABNORMAL LOW (ref 0.44–1.00)
Glucose, Bld: 75 mg/dL (ref 70–99)
HCT: 26 % — ABNORMAL LOW (ref 36.0–46.0)
Hemoglobin: 8.8 g/dL — ABNORMAL LOW (ref 12.0–15.0)
Potassium: 2.8 mmol/L — ABNORMAL LOW (ref 3.5–5.1)
Sodium: 142 mmol/L (ref 135–145)
TCO2: 19 mmol/L — ABNORMAL LOW (ref 22–32)

## 2021-04-16 LAB — CBC
HCT: 35.7 % — ABNORMAL LOW (ref 36.0–46.0)
Hemoglobin: 11.8 g/dL — ABNORMAL LOW (ref 12.0–15.0)
MCH: 29.7 pg (ref 26.0–34.0)
MCHC: 33.1 g/dL (ref 30.0–36.0)
MCV: 89.9 fL (ref 80.0–100.0)
Platelets: 325 10*3/uL (ref 150–400)
RBC: 3.97 MIL/uL (ref 3.87–5.11)
RDW: 13.2 % (ref 11.5–15.5)
WBC: 7.1 10*3/uL (ref 4.0–10.5)
nRBC: 0 % (ref 0.0–0.2)

## 2021-04-16 LAB — DIFFERENTIAL
Abs Immature Granulocytes: 0.02 10*3/uL (ref 0.00–0.07)
Basophils Absolute: 0.1 10*3/uL (ref 0.0–0.1)
Basophils Relative: 1 %
Eosinophils Absolute: 0.2 10*3/uL (ref 0.0–0.5)
Eosinophils Relative: 3 %
Immature Granulocytes: 0 %
Lymphocytes Relative: 28 %
Lymphs Abs: 2 10*3/uL (ref 0.7–4.0)
Monocytes Absolute: 0.8 10*3/uL (ref 0.1–1.0)
Monocytes Relative: 11 %
Neutro Abs: 4 10*3/uL (ref 1.7–7.7)
Neutrophils Relative %: 57 %

## 2021-04-16 LAB — PROTIME-INR
INR: 0.9 (ref 0.8–1.2)
Prothrombin Time: 12.3 seconds (ref 11.4–15.2)

## 2021-04-16 LAB — CBG MONITORING, ED: Glucose-Capillary: 82 mg/dL (ref 70–99)

## 2021-04-16 MED ORDER — ASPIRIN EC 81 MG PO TBEC: 81.0000 mg | DELAYED_RELEASE_TABLET | Freq: Every day | ORAL | Status: DC

## 2021-04-16 MED ORDER — ENOXAPARIN SODIUM 40 MG/0.4ML IJ SOSY
40.0000 mg | PREFILLED_SYRINGE | INTRAMUSCULAR | Status: DC
  Administered 2021-04-16: 40 mg via SUBCUTANEOUS
  Filled 2021-04-16: qty 0.4

## 2021-04-16 MED ORDER — SENNOSIDES-DOCUSATE SODIUM 8.6-50 MG PO TABS: 1.0000 | ORAL_TABLET | Freq: Every evening | ORAL | Status: DC | PRN

## 2021-04-16 MED ORDER — ASPIRIN 325 MG PO TABS
325.0000 mg | ORAL_TABLET | Freq: Once | ORAL | Status: AC
Start: 2021-04-16 — End: 2021-04-16
  Administered 2021-04-16: 325 mg via ORAL
  Filled 2021-04-16: qty 1

## 2021-04-16 MED ORDER — CLOPIDOGREL BISULFATE 75 MG PO TABS
75.0000 mg | ORAL_TABLET | Freq: Once | ORAL | Status: AC
  Administered 2021-04-16: 75 mg via ORAL
  Filled 2021-04-16: qty 1

## 2021-04-16 MED ORDER — ALPRAZOLAM 0.25 MG PO TABS
0.2500 mg | ORAL_TABLET | Freq: Three times a day (TID) | ORAL | Status: DC | PRN
  Administered 2021-04-16: 0.25 mg via ORAL
  Filled 2021-04-16: qty 1

## 2021-04-16 MED ORDER — SUMATRIPTAN 20 MG/ACT NA SOLN
20.0000 mg | NASAL | Status: DC | PRN
  Filled 2021-04-16: qty 1

## 2021-04-16 MED ORDER — ACETAMINOPHEN 160 MG/5ML PO SOLN: 650.0000 mg | ORAL | Status: DC | PRN

## 2021-04-16 MED ORDER — LORAZEPAM 1 MG PO TABS: 0.5000 mg | ORAL_TABLET | Freq: Four times a day (QID) | ORAL | Status: DC | PRN

## 2021-04-16 MED ORDER — STROKE: EARLY STAGES OF RECOVERY BOOK
Freq: Once | Status: AC
  Administered 2021-04-16: 1
  Filled 2021-04-16: qty 1

## 2021-04-16 MED ORDER — CLOPIDOGREL BISULFATE 75 MG PO TABS: 75.0000 mg | ORAL_TABLET | Freq: Every day | ORAL | Status: DC

## 2021-04-16 MED ORDER — ESCITALOPRAM OXALATE 10 MG PO TABS
10.0000 mg | ORAL_TABLET | Freq: Every day | ORAL | Status: DC
  Administered 2021-04-17: 10 mg via ORAL
  Filled 2021-04-16: qty 1

## 2021-04-16 MED ORDER — IOHEXOL 350 MG/ML SOLN
100.0000 mL | Freq: Once | INTRAVENOUS | Status: AC | PRN
  Administered 2021-04-16: 100 mL via INTRAVENOUS

## 2021-04-16 MED ORDER — ROSUVASTATIN CALCIUM 20 MG PO TABS
20.0000 mg | ORAL_TABLET | Freq: Every day | ORAL | Status: DC
  Administered 2021-04-17: 20 mg via ORAL
  Filled 2021-04-16: qty 1

## 2021-04-16 MED ORDER — LORAZEPAM 2 MG/ML IJ SOLN
0.5000 mg | Freq: Four times a day (QID) | INTRAMUSCULAR | Status: DC | PRN
  Administered 2021-04-16: 0.5 mg via INTRAVENOUS
  Filled 2021-04-16: qty 1

## 2021-04-16 MED ORDER — VITAMIN D 25 MCG (1000 UNIT) PO TABS
5000.0000 [IU] | ORAL_TABLET | Freq: Every day | ORAL | Status: DC
  Administered 2021-04-17: 5000 [IU] via ORAL
  Filled 2021-04-16: qty 5

## 2021-04-16 MED ORDER — ACETAMINOPHEN 650 MG RE SUPP: 650.0000 mg | RECTAL | Status: DC | PRN

## 2021-04-16 MED ORDER — EZETIMIBE 10 MG PO TABS
10.0000 mg | ORAL_TABLET | Freq: Every day | ORAL | Status: DC
  Administered 2021-04-17: 10 mg via ORAL
  Filled 2021-04-16: qty 1

## 2021-04-16 MED ORDER — SODIUM CHLORIDE 0.9% FLUSH
3.0000 mL | Freq: Once | INTRAVENOUS | Status: AC
Start: 2021-04-16 — End: 2021-04-16
  Administered 2021-04-16: 3 mL via INTRAVENOUS

## 2021-04-16 MED ORDER — SODIUM CHLORIDE 0.9 % IV BOLUS
1000.0000 mL | Freq: Once | INTRAVENOUS | Status: AC
  Administered 2021-04-16: 1000 mL via INTRAVENOUS

## 2021-04-16 MED ORDER — HYDRALAZINE HCL 25 MG PO TABS: 25.0000 mg | ORAL_TABLET | Freq: Four times a day (QID) | ORAL | Status: DC | PRN

## 2021-04-16 MED ORDER — ACETAMINOPHEN 325 MG PO TABS: 650.0000 mg | ORAL_TABLET | ORAL | Status: DC | PRN

## 2021-04-16 NOTE — ED Provider Notes (Signed)
[facility] Provider Note   CSN: [id number] Arrival date & time: 04/16/21  1403  An emergency department physician performed an initial assessment on this suspected stroke patient at 1404.  History Chief Complaint  Patient presents with   Code Stroke    Deborah [name] is a 75 y.o. female with past medical history significant for hyperlipidemia, depression, prior TIA who presents as a code stroke with word finding difficulty.  Patient was in her usual state of health when she woke up this morning and her last known normal was approximately noon according to her husband.  Around 1 PM, the patient came outside to speak with her husband and could not seem to get words out.  She was able to communicate that "something was not right."  Not have any weakness, numbness, or vision changes.  EMS was called and she was brought to the ED as a code stroke.    Past Medical History:  Diagnosis Date   Allergy    Anemia    Anxiety    Aortic atherosclerosis ([place]) 11/2016   Noted on CT abd   Arthritis    hands and knees   Bilateral cataracts    History of   Cellulitis    Right finger   Cerebral amyloid angiopathy (CODE)    Cervical spondylosis 12/2008   Noted on Cervical spine   Chronic nausea    Compression fracture of T12 vertebra ([place]) 06/07/2017   Mild   DDD (degenerative disc disease), cervical 12/2008   Noted on Cervical spine   Delusions ([place]) 03/24/2018   Depression    Encephalopathy 01/02/2017   Mild, [place] [facility]   GERD (gastroesophageal reflux disease)    Helicobacter pylori infection 2018   History of appendicitis    age 51   History of colon polyps    History of confusion    History of hiatal hernia 10/16/2016   Noted on Endoscopy   Hyperlipidemia    Incontinence in female 08/06/2014   Internal hemorrhoid 08/02/2017   Noted on Colonoscopy   Low blood pressure reading    Migraines    Osteopenia    PONV (postoperative nausea and  vomiting)    Pre-diabetes    RLS (restless legs syndrome)    Seizure (HCC)    atypical partial comples vs. confusional migraines   Seizure disorder ([place]) 12/15/2016   Skin cancer    right lower eyelid   Squamous cell skin cancer    history of neck and upper back   Syncope 03/25/2018   TIA (transient ischemic attack)    questionable per patient   Vitamin D deficiency     Patient Active Problem List   Diagnosis Date Noted   TIA (transient ischemic attack) 04/16/2021   Syncope 03/25/2018   Primary osteoarthritis of left knee 03/22/2018   GERD (gastroesophageal reflux disease) 02/25/2018   Osteopenia 02/25/2018   Compression fracture of T12 vertebra ([place]) 05/30/2017   Aortic atherosclerosis ([place]) by Abd CT scan  11/2016 02/03/2017   History of TIA (transient ischemic attack) 12/15/2016   BMI 24.0-24.9, adult 03/12/2015   Skin cancer 08/06/2014   Migraine 08/06/2014   Generalized anxiety disorder 08/06/2014   Abnormal glucose 07/01/2013   Medication management 07/01/2013   Hypertension    Hyperlipidemia    Vitamin D deficiency     Past Surgical History:  Procedure Laterality Date   APPENDECTOMY  1959   CATARACT EXTRACTION, BILATERAL Bilateral 2014   CESAREAN SECTION  1978   COLONOSCOPY W/ POLYPECTOMY  08/02/2017   KNEE ARTHROSCOPY Bilateral 04/2014   [facility]   MOHS SURGERY Right 04/2013   Squamous cell cacinoma, neck/upper back   SPINE SURGERY  2010   C6-C7 fusion   TOTAL KNEE ARTHROPLASTY Left 03/22/2018   Procedure: LEFT TOTAL KNEE ARTHROPLASTY;  Surgeon: Dorna Leitz, MD;  Location: [facility];  Service: Orthopedics;  Laterality: Left;  Adductor Block   UPPER GI ENDOSCOPY  10/16/2016     OB History   No obstetric history on file.     Family History  Problem Relation Age of Onset   Diabetes Mother    Heart disease Mother    Heart disease Father    Stroke Father    Hypertension Father    Hyperlipidemia Sister    Hypertension Brother     Social  History   Tobacco Use   Smoking status: Former    Packs/day: 1.00    Years: 4.00    Pack years: 4.00    Types: Cigarettes    Quit date: 03/25/1986    Years since quitting: 35.0   Smokeless tobacco: Never  Vaping Use   Vaping Use: Never used  Substance Use Topics   Alcohol use: Not Currently    Comment: occasional   Drug use: No    Home Medications Prior to Admission medications   Medication Sig Start Date End Date Taking? Authorizing Provider  alendronate (FOSAMAX) 70 MG tablet Take      1 tablet      every 7 days       with a full glass of water       on an empty stomach       for 60 minutes 01/31/20   Unk Pinto, MD  ALPRAZolam Duanne Moron) 0.5 MG tablet TAKE 1/2 TO 1 TABLET BY MOUTH 2 TO 3 TIMES ONLY IF NEEDED FOR ANXIETY ATTACK AND LIMIT TO 5 DAYS A WEEK TO AVOID ADDICTION 02/09/21   Unk Pinto, MD  aspirin EC 81 MG tablet Take 1 tablet (81 mg total) by mouth daily. 11/30/20   Donato Heinz, MD  busPIRone (BUSPAR) 10 MG tablet Take  1 tablet  3 x /day  as needed for Nerves                                          /         TAKE 1 TABLET BY MOUTH THREE TIMES DAILY 01/25/21   Unk Pinto, MD  Cholecalciferol (VITAMIN D3) 125 MCG (5000 UT) CAPS Take 5,000 Units by mouth daily.    [provider]  escitalopram (LEXAPRO) 10 MG tablet Take 1 tablet (10 mg total) by mouth daily. 06/14/20 06/14/21  Garnet Sierras, NP  ezetimibe (ZETIA) 10 MG tablet TAKE 1 TABLET BY MOUTH ONCE DAILY FOR CHOLESTEROL 12/28/20   Magda Bernheim, NP  fluticasone (FLONASE) 50 MCG/ACT nasal spray Place 1 spray into both nostrils 2 (two) times daily. 03/09/21   [provider]  Multiple Vitamins-Minerals (MULTIVITAMIN PO) Take 1 tablet by mouth daily.    [provider]  rosuvastatin (CRESTOR) 20 MG tablet TAKE 1 TABLET BY MOUTH ONCE DAILY FOR CHOLESTEROL 12/28/20   Magda Bernheim, NP  SUMAtriptan (IMITREX) 20 MG/ACT nasal spray USE ONE DOSE IN THE NOSE NOW, MAY REPEAT IN 2  HOURS. MAX OF 2 DOSES IN 24  HOURS. Patient taking differently: Place 20 mg into the nose every 2 (two) hours as needed for migraine. 02/05/17   Vladimir Crofts, PA-C    Allergies    Banana, Chocolate, Effexor [venlafaxine], Maxalt [rizatriptan benzoate], Other, Tussin [guaifenesin], and Zostavax [zoster vaccine live]  Review of Systems   Review of Systems  Constitutional:  Negative for chills and fever.  HENT:  Negative for ear pain and sore throat.   Eyes:  Negative for pain and visual disturbance.  Respiratory:  Negative for cough and shortness of breath.   Cardiovascular:  Negative for chest pain and palpitations.  Gastrointestinal:  Negative for abdominal pain and vomiting.  Genitourinary:  Negative for dysuria and hematuria.  Musculoskeletal:  Negative for arthralgias and back pain.  Skin:  Negative for color change and rash.  Neurological:  Positive for speech difficulty. Negative for seizures, syncope, weakness and numbness.  All other systems reviewed and are negative.  Physical Exam Updated Vital Signs BP (!) 159/81    Pulse 85    Temp 98.3 F (36.8 C) (Oral)    Resp 17    Ht 5\' 7"  (1.702 m)    Wt 61.2 kg    SpO2 98%    BMI 21.14 kg/m   Physical Exam Vitals and nursing note reviewed.  Constitutional:      General: She is not in acute distress.    Appearance: She is well-developed.  HENT:     Head: Normocephalic and atraumatic.     Right Ear: External ear normal.     Left Ear: External ear normal.     Nose: Nose normal.     Mouth/Throat:     Mouth: Mucous membranes are moist.     Pharynx: Oropharynx is clear.  Eyes:     Extraocular Movements: Extraocular movements intact.     Conjunctiva/sclera: Conjunctivae normal.     Pupils: Pupils are equal, round, and reactive to light.  Cardiovascular:     Rate and Rhythm: Normal rate and regular rhythm.     Pulses: Normal pulses.     Heart sounds: Normal heart sounds. No murmur heard. Pulmonary:     Effort: Pulmonary  effort is normal. No respiratory distress.     Breath sounds: Normal breath sounds.  Abdominal:     Palpations: Abdomen is soft.     Tenderness: There is no abdominal tenderness.  Musculoskeletal:        General: No swelling.     Cervical back: Neck supple.  Skin:    General: Skin is warm and dry.     Capillary Refill: Capillary refill takes less than 2 seconds.  Neurological:     General: No focal deficit present.     Mental Status: She is alert and oriented to person, place, and time.     Cranial Nerves: No cranial nerve deficit.     Sensory: No sensory deficit.     Motor: No weakness.     Coordination: Coordination normal.  Psychiatric:        Mood and Affect: Mood normal.    ED Results / Procedures / Treatments   Labs (all labs ordered are listed, but only abnormal results are displayed) Labs Reviewed  CBC - Abnormal; Notable for the following components:      Result Value   Hemoglobin 11.8 (*)    HCT 35.7 (*)    All other components within normal limits  COMPREHENSIVE METABOLIC PANEL - Abnormal; Notable for the following components:   Sodium  134 (*)    Glucose, Bld 103 (*)    All other components within normal limits  I-STAT CHEM 8, ED - Abnormal; Notable for the following components:   Potassium 2.8 (*)    Chloride 112 (*)    Creatinine, Ser 0.40 (*)    Calcium, Ion 0.99 (*)    TCO2 19 (*)    Hemoglobin 8.8 (*)    HCT 26.0 (*)    All other components within normal limits  RESP PANEL BY RT-PCR (FLU A&B, COVID) ARPGX2  PROTIME-INR  APTT  DIFFERENTIAL  CBG MONITORING, ED    EKG EKG Interpretation  Date/Time:  Saturday April 16 2021 14:52:45 EST Ventricular Rate:  86 PR Interval:  181 QRS Duration: 80 QT Interval:  374 QTC Calculation: 448 R Axis:   58 Text Interpretation: Sinus rhythm Atrial premature complex Biatrial enlargement No significant change since last tracing Confirmed by Fredia Sorrow [phone]) on 04/16/2021 3:48:09 PM  Radiology CT  HEAD CODE STROKE WO CONTRAST  Result Date: 04/16/2021 CLINICAL DATA:  Code stroke. Neuro deficit, acute, stroke suspected. EXAM: CT HEAD WITHOUT CONTRAST TECHNIQUE: Contiguous axial images were obtained from the base of the skull through the vertex without intravenous contrast. COMPARISON:  CT head without contrast 03/25/2018 FINDINGS: Brain: Mild atrophy and generalized white matter disease is similar the prior exams. No acute infarct, hemorrhage, or mass lesion is present. Basal ganglia are intact. Insular ribbon is normal. No acute or focal cortical abnormality is present. The brainstem and cerebellum are within normal limits. Vascular: Atherosclerotic calcifications are present within the cavernous internal carotid arteries bilaterally and at the dural margin of both vertebral arteries. Skull: Calvarium is intact. No focal lytic or blastic lesions are present. No significant extracranial soft tissue lesion is present. Sinuses/Orbits: The paranasal sinuses and mastoid air cells are clear. The globes and orbits are within normal limits. ASPECTS [facility] Stroke Program Early CT Score) - Ganglionic level infarction (caudate, lentiform nuclei, internal capsule, insula, M1-M3 cortex): 7/7 - Supraganglionic infarction (M4-M6 cortex): 3/3 Total score (0-10 with 10 being normal): 10/10 IMPRESSION: 1. Stable mild atrophy and generalized white matter disease. This likely reflects the sequela of chronic microvascular ischemia. 2. No acute intracranial abnormality or significant interval change. 3. ASPECTS is 10/10. Electronically Signed   By: San Morelle M.D.   On: 04/16/2021 14:18   CT ANGIO HEAD NECK W WO CM (CODE STROKE)  Result Date: 04/16/2021 CLINICAL DATA:  Acute onset of expressive aphasia. Patient has since returned to baseline. EXAM: CT ANGIOGRAPHY HEAD AND NECK TECHNIQUE: Multidetector CT imaging of the head and neck was performed using the standard protocol during bolus administration of  intravenous contrast. Multiplanar CT image reconstructions and MIPs were obtained to evaluate the vascular anatomy. Carotid stenosis measurements (when applicable) are obtained utilizing NASCET criteria, using the distal internal carotid diameter as the denominator. CONTRAST:  161mL OMNIPAQUE IOHEXOL 350 MG/ML SOLN COMPARISON:  CT head without contrast 04/16/2021 FINDINGS: CTA NECK FINDINGS Aortic arch: A 3 vessel arch configuration is present. Atherosclerotic changes are present at the arch without aneurysm or stenosis. Right carotid system: The right common carotid artery is within normal limits. Bifurcation is unremarkable. The cervical right ICA is within normal limits. Left carotid system: The left common carotid artery is within normal limits. Atherosclerotic calcifications are present at the bifurcation. Mild tortuosity is present cervical left ICA without significant stenosis. Vertebral arteries: The left vertebral artery is dominant. Both vertebral arteries originate from the subclavian arteries without significant  stenosis. No significant stenosis is present in either vertebral artery in the neck. Skeleton: Cervical fusion is noted at C6-7. Ankylosis present at C4-5. No focal osseous lesions are present. Other neck: Soft tissues the neck are otherwise unremarkable. Salivary glands are within normal limits. Thyroid is normal. No significant adenopathy is present. No focal mucosal or submucosal lesions are present. Upper chest: The lung apices are clear. Thoracic inlet is within normal limits. Review of the MIP images confirms the above findings CTA HEAD FINDINGS Anterior circulation: Minimal atherosclerotic calcifications are present within the right cavernous internal carotid artery. No significant stenosis is present through the ICA termini. The A1 and M1 segments are normal. The right A1 segment is dominant. The anterior communicating artery is patent. MCA bifurcations are intact. The ACA and MCA branch  vessels are within normal limits. Posterior circulation: The left vertebral artery is the dominant vessel. Calcifications are present at the dural margins of both vertebral arteries without significant stenosis. PICA origins are visualized and normal. The vertebrobasilar junction is normal. Basilar artery is within normal limits. Both posterior cerebral arteries originate from basilar tip. Right posterior communicating artery contributes. The PCA branch vessels are within normal limits bilaterally. Venous sinuses: The dural sinuses are patent. The straight sinus deep cerebral veins are intact. Cortical veins are within normal limits. No significant vascular malformation is evident. Anatomic variants: None Review of the MIP images confirms the above findings IMPRESSION: 1. No emergent large vessel occlusion. 2. Minimal atherosclerotic changes at the left carotid bifurcation and right cavernous internal carotid artery without significant stenosis. 3. No significant proximal stenosis, aneurysm, or branch vessel occlusion within the Circle of Willis. 4. Cervical fusion at C6-7. Ankylosis at C4-5. Electronically Signed   By: San Morelle M.D.   On: 04/16/2021 15:50    Procedures Procedures   Medications Ordered in ED Medications  SUMAtriptan (IMITREX) nasal spray 20 mg (has no administration in time range)  ezetimibe (ZETIA) tablet 10 mg (has no administration in time range)  rosuvastatin (CRESTOR) tablet 20 mg (has no administration in time range)  ALPRAZolam (XANAX) tablet 0.25 mg (has no administration in time range)  escitalopram (LEXAPRO) tablet 10 mg (has no administration in time range)  Vitamin D3 CAPS 5,000 Units (has no administration in time range)   stroke: mapping our early stages of recovery book (has no administration in time range)  acetaminophen (TYLENOL) tablet 650 mg (has no administration in time range)    Or  acetaminophen (TYLENOL) 160 MG/5ML solution 650 mg (has no  administration in time range)    Or  acetaminophen (TYLENOL) suppository 650 mg (has no administration in time range)  senna-docusate (Senokot-S) tablet 1 tablet (has no administration in time range)  enoxaparin (LOVENOX) injection 40 mg (has no administration in time range)  LORazepam (ATIVAN) tablet 0.5 mg (has no administration in time range)  aspirin EC tablet 81 mg (has no administration in time range)  clopidogrel (PLAVIX) tablet 75 mg (has no administration in time range)  hydrALAZINE (APRESOLINE) tablet 25 mg (has no administration in time range)  sodium chloride flush (NS) 0.9 % injection 3 mL (3 mLs Intravenous Given 04/16/21 1511)  aspirin tablet 325 mg (325 mg Oral Given 04/16/21 1438)  clopidogrel (PLAVIX) tablet 75 mg (75 mg Oral Given 04/16/21 1500)  sodium chloride 0.9 % bolus 1,000 mL (1,000 mLs Intravenous New Bag/Given 04/16/21 1439)  iohexol (OMNIPAQUE) 350 MG/ML injection 100 mL (100 mLs Intravenous Contrast Given 04/16/21 1429)    ED  Course  I have reviewed the triage vital signs and the nursing notes.  Pertinent labs & imaging results that were available during my care of the patient were reviewed by me and considered in my medical decision making (see chart for details).    MDM Rules/Calculators/A&P                          Patient presents as a code stroke as described in HPI above.  Physical exam is unremarkable with no focal neurologic findings.  CT code stroke negative for acute stroke or emergent LVO.  Patient does have mild atherosclerotic changes of the carotid arteries bilaterally without significant stenosis.  Neurology recommending admission for further stroke work-up.  I discussed the patient with the hospitalist who will admit the patient.  Final Clinical Impression(s) / ED Diagnoses Final diagnoses:  Difficulty with speech    Rx / DC Orders ED Discharge Orders     None        Serafin Decatur, Amalia Hailey, MD 04/17/21 1249    Fredia Sorrow,  MD 05/08/21 [phone]

## 2021-04-16 NOTE — Code Documentation (Signed)
Stroke Response Nurse Documentation Code Documentation  Deborah [name] is a 75 y.o. female arriving to [facility] ED via [facility] on expressive aphasia with past medical hx of HTN, HLD, anemia, pre-diabetes, GERD, and migraines. On aspirin 81 mg daily and rosuvastatin, and ezetimibe . Code stroke was activated by EMS (EMS, ED).   Patient from home where she was LKW at 1300 and now complaining of expressive aphasia while talking on the phone with her son.   Stroke team at the bedside on patient arrival. Labs drawn and patient cleared for CT by EPD. Patient to CT with team. NIHSS 1, see documentation for details and code stroke times. Patient with Expressive aphasia  on exam. The following imaging was completed:  CT and CTA head and neck (CT, CTA head and neck, CTP). Since the patient has since returned to her baseline no TnK offered. She will get q15 minute NIH and will be within the TnK window until 1730.     Bedside handoff with ED RN Luiz Iron.    [place]  Rapid Response RN

## 2021-04-16 NOTE — Consult Note (Signed)
Neurology Consultation  Reason for Consult: Code Stroke Referring Physician: Dr. Rogene Houston  CC: Intermittent word-finding difficulties  History is obtained from: EMS, Patient, Patient's husband at bedside, Chart review  HPI: Deborah [name] is a 75 y.o. female with a medical history significant for essential hypertension, hyperlipidemia, anemia, pre-diabetes, anxiety, GERD, and migraines who presented to the ED 12/31 via EMS for evaluation of word-finding difficulties. Patient was on the telephone with her son this afternoon and at 13:00, she began having difficulty getting her words out and communicating with him. He attempted to her Deborah [name] to go alert her husband but was unsuccessful. Eventually, when her husband was alerted, EMS was activated. EMS states that on arrival to her home, the word-finding difficulties had subsided but that these events recurred and resolved two separate times en route to the hospital. Neurology met the patient at the bridge for further evaluation of Code Stroke.   Of note, patient was seen on 02/22/2021 by her PCP for evaluation of a similar presentation of 2 episodes of 10 minute word-finding difficulties within the week of evaluation without further focal neurologic signs or symptoms. During this evaluation, her provider felt that her word-finding difficulties were secondary to TIA. Patient states that she has had headaches associated with word-finding difficulties in the past but denies a current headache. Her provider noted that in 2018, patient had episodes of confusion that were felt to be most likely related to "confusional migraines versus atypical seizures" and was treated with Topamax and then Keppra before discontinuation of both in 2020.   LKW: 13:00, though patient's symptoms resolved at 14:30 during assessment. TNK given?: no, patient's symptoms again resolved while in the ED with an NIHSS of 0. IR Thrombectomy? No, angiography imaging reviewed without  evidence of an LVO Modified Rankin Scale: 0-Completely asymptomatic and back to baseline post- stroke  ROS: A complete ROS was performed and is negative except as noted in the HPI.  Past Medical History:  Diagnosis Date   Allergy    Anemia    Anxiety    Aortic atherosclerosis ([facility]) 11/2016   Noted on CT abd   Arthritis    hands and knees   Bilateral cataracts    History of   Cellulitis    Right finger   Cerebral amyloid angiopathy ([facility])    Cervical spondylosis 12/2008   Noted on Cervical spine   Chronic nausea    Compression fracture of T12 vertebra ([facility]) 06/07/2017   Mild   DDD (degenerative disc disease), cervical 12/2008   Noted on Cervical spine   Delusions ([place]) 03/24/2018   Depression    Encephalopathy 01/02/2017   Mild, [place] [facility]   GERD (gastroesophageal reflux disease)    Helicobacter pylori infection 2018   History of appendicitis    age 66   History of colon polyps    History of confusion    History of hiatal hernia 10/16/2016   Noted on Endoscopy   Hyperlipidemia    Incontinence in female 08/06/2014   Internal hemorrhoid 08/02/2017   Noted on Colonoscopy   Low blood pressure reading    Migraines    Osteopenia    PONV (postoperative nausea and vomiting)    Pre-diabetes    RLS (restless legs syndrome)    Seizure (HCC)    atypical partial comples vs. confusional migraines   Seizure disorder ([place]) 12/15/2016   Skin cancer    right lower eyelid   Squamous cell skin cancer    history of  neck and upper back   Syncope 03/25/2018   TIA (transient ischemic attack)    questionable per patient   Vitamin D deficiency    Past Surgical History:  Procedure Laterality Date   APPENDECTOMY  1959   CATARACT EXTRACTION, BILATERAL Bilateral 2014   CESAREAN SECTION  1978   COLONOSCOPY W/ POLYPECTOMY  08/02/2017   KNEE ARTHROSCOPY Bilateral 04/2014   [facility]   MOHS SURGERY Right 04/2013   Squamous cell cacinoma, neck/upper back   SPINE SURGERY  2010    C6-C7 fusion   TOTAL KNEE ARTHROPLASTY Left 03/22/2018   Procedure: LEFT TOTAL KNEE ARTHROPLASTY;  Surgeon: Dorna Leitz, MD;  Location: [facility];  Service: Orthopedics;  Laterality: Left;  Adductor Block   UPPER GI ENDOSCOPY  10/16/2016   Family History  Problem Relation Age of Onset   Diabetes Mother    Heart disease Mother    Heart disease Father    Stroke Father    Hypertension Father    Hyperlipidemia Sister    Hypertension Brother    Social History:   reports that she quit smoking about 35 years ago. Her smoking use included cigarettes. She has a 4.00 pack-year smoking history. She has never used smokeless tobacco. She reports that she does not currently use alcohol. She reports that she does not use drugs.  Medications  Current Facility-Administered Medications:    clopidogrel (PLAVIX) tablet 75 mg, 75 mg, Oral, Once, Toberman, Stevi W, NP   sodium chloride flush (NS) 0.9 % injection 3 mL, 3 mL, Intravenous, Once, Lajean Saver, MD  Current Outpatient Medications:    alendronate (FOSAMAX) 70 MG tablet, Take      1 tablet      every 7 days       with a full glass of water       on an empty stomach       for 60 minutes, Disp: 13 tablet, Rfl: 0   ALPRAZolam (XANAX) 0.5 MG tablet, TAKE 1/2 TO 1 TABLET BY MOUTH 2 TO 3 TIMES ONLY IF NEEDED FOR ANXIETY ATTACK AND LIMIT TO 5 DAYS A WEEK TO AVOID ADDICTION, Disp: 60 tablet, Rfl: 0   aspirin EC 81 MG tablet, Take 1 tablet (81 mg total) by mouth daily., Disp: 30 tablet, Rfl: 11   busPIRone (BUSPAR) 10 MG tablet, Take  1 tablet  3 x /day  as needed for Nerves                                          /         TAKE 1 TABLET BY MOUTH THREE TIMES DAILY, Disp: 270 tablet, Rfl: 0   Cholecalciferol (VITAMIN D3) 125 MCG (5000 UT) CAPS, Take 5,000 Units by mouth daily., Disp: , Rfl:    escitalopram (LEXAPRO) 10 MG tablet, Take 1 tablet (10 mg total) by mouth daily., Disp: 90 tablet, Rfl: 2   ezetimibe (ZETIA) 10 MG tablet, TAKE 1 TABLET BY MOUTH ONCE  DAILY FOR CHOLESTEROL, Disp: 90 tablet, Rfl: 0   Multiple Vitamins-Minerals (MULTIVITAMIN PO), Take 1 tablet by mouth daily., Disp: , Rfl:    rosuvastatin (CRESTOR) 20 MG tablet, TAKE 1 TABLET BY MOUTH ONCE DAILY FOR CHOLESTEROL, Disp: 90 tablet, Rfl: 0   SUMAtriptan (IMITREX) 20 MG/ACT nasal spray, USE ONE DOSE IN THE NOSE NOW, MAY REPEAT IN 2 HOURS. MAX OF 2  DOSES IN 24 HOURS. (Patient taking differently: Place 20 mg into the nose every 2 (two) hours as needed for migraine.), Disp: 12 Inhaler, Rfl: 2  Exam: Current vital signs: Wt 68.6 kg    BMI 24.04 kg/m  Vital signs in last 24 hours: Weight:  [68.6 kg] 68.6 kg (12/31 1410)  GENERAL: Awake, alert, in no acute distress Psych: Affect appropriate for situation, patient is calm and cooperative with examination Head: Normocephalic and atraumatic, without obvious abnormality EENT: Normal conjunctivae, dry mucous membranes, no OP obstruction LUNGS: Normal respiratory effort. Non-labored breathing on room air. CV: Regular rate and rhythm on telemetry ABDOMEN: Soft, non-tender, non-distended Extremities: warm, well perfused, without obvious deformity  NEURO:  Mental Status: Awake, alert, and oriented to person, place, time, and situation. She is able to provide a clear and coherent history of present illness. Speech/Language: speech is without dysarthria.  There is minimal naming impairment initially with 5/6 objects named correctly. Patient does initially have a greater impairment noted while explaining a picture though on reevaluation, these symptoms resolved. No neglect is noted Cranial Nerves:  II: PERRL 3 mm/brisk. Visual fields full.  III, IV, VI: EOMI without ptosis, gaze preference, or nystagmus.  V: Sensation is intact to light touch and symmetrical to face.  VII: Face is symmetric resting and smiling.  VIII: Hearing is intact to voice IX, X: Palate elevation is symmetric. Phonation normal.  XI: Normal sternocleidomastoid and  trapezius muscle strength XII: Tongue protrudes midline without fasciculations.   Motor: 5/5 strength is all muscle groups without vertical drift.  Tone is normal. Bulk is normal.  Sensation: Intact to light touch bilaterally in all four extremities. No extinction to DSS present.  Coordination: FTN intact bilaterally. HKS intact bilaterally.  Gait: Deferred  NIHSS: 1a Level of Conscious.: 0 1b LOC Questions: 0 1c LOC Commands: 0 2 Best Gaze: 0 3 Visual: 0 4 Facial Palsy: 0 5a Motor Arm - left: 0 5b Motor Arm - Right: 0 6a Motor Leg - Left: 0 6b Motor Leg - Right: 0 7 Limb Ataxia: 0 8 Sensory: 0 9 Best Language: 1 10 Dysarthria: 0 11 Extinct. and Inatten.: 0 TOTAL: 1  Labs I have reviewed labs in epic and the results pertinent to this consultation are: CBC    Component Value Date/Time   WBC 7.1 04/16/2021 1410   RBC 3.97 04/16/2021 1410   HGB 8.8 (L) 04/16/2021 1412   HCT 26.0 (L) 04/16/2021 1412   HCT 40.8 12/15/2016 2118   PLT 325 04/16/2021 1410   MCV 89.9 04/16/2021 1410   MCH 29.7 04/16/2021 1410   MCHC 33.1 04/16/2021 1410   RDW 13.2 04/16/2021 1410   LYMPHSABS 2.0 04/16/2021 1410   MONOABS 0.8 04/16/2021 1410   EOSABS 0.2 04/16/2021 1410   BASOSABS 0.1 04/16/2021 1410   CMP     Component Value Date/Time   NA 142 04/16/2021 1412   K 2.8 (L) 04/16/2021 1412   CL 112 (H) 04/16/2021 1412   CO2 25 12/22/2020 1016   GLUCOSE 75 04/16/2021 1412   BUN 9 04/16/2021 1412   CREATININE 0.40 (L) 04/16/2021 1412   CREATININE 0.83 12/22/2020 1016   CALCIUM 9.9 12/22/2020 1016   PROT 6.9 12/22/2020 1016   ALBUMIN 3.3 (L) 03/25/2018 0820   AST 19 12/22/2020 1016   ALT 12 12/22/2020 1016   ALKPHOS 69 03/25/2018 0820   BILITOT 0.5 12/22/2020 1016   GFRNONAA 66 09/06/2020 1403   GFRAA 76 09/06/2020 1403  Lipid Panel     Component Value Date/Time   CHOL 150 12/22/2020 1016   TRIG 80 12/22/2020 1016   HDL 71 12/22/2020 1016   CHOLHDL 2.1 12/22/2020 1016    VLDL 15 12/16/2016 0613   LDLCALC 63 12/22/2020 1016   Lab Results  Component Value Date   HGBA1C 5.3 09/06/2020   Imaging I have reviewed the images obtained:  CT-scan of the brain 12/31: 1. Stable mild atrophy and generalized white matter disease. This likely reflects the sequela of chronic microvascular ischemia. 2. No acute intracranial abnormality or significant interval change. 3. ASPECTS is 10/10.  CT angio head and neck 12/31:  1. No emergent large vessel occlusion. 2. Minimal atherosclerotic changes at the left carotid bifurcation and right cavernous internal carotid artery without significant stenosis. 3. No significant proximal stenosis, aneurysm, or branch vessel occlusion within the Circle of Willis. 4. Cervical fusion at C6-7. Ankylosis at C4-5.  Assessment: 75 y.o. female with PMHx as above who presented to the ED for evaluation of intermittent aphasia at home while talking with her son on the telephone. Patient with history of the same with concern for TIA in early November 2022. - Examination initially reveals patient with subtle word-finding difficulties more prominent in conversation than naming objects. Her NIHSS initially is a 1. On reevaluation at 14:30, patient's symptoms had resolved with an NIHSS of 0 without further word-finding difficulties.  - Presentation is concerning for TIA versus a stuttering stroke. Will complete stroke work up. - CT imaging reveals stable mild atrophy and generalized white matter disease that likely reflects the sequela of chronic microvascular disease without acute intracranial abnormality. CT angiography imaging is without an LVO with minimal atherosclerotic changes at the left carotid bifurcation and right cavernous internal carotid artery without significant stenosis.  - Stroke risk factors include patient's advanced age, HTN, HLD, prediabetes, and history of TIA.   Recommendations: - MRI brain wo contrast - Frequent neuro checks   - Patient laid flat and given a bolus of fluid  - HgbA1c, fasting lipid panel - Echocardiogram - Prophylactic therapy- Antiplatelet med: Aspirin - dose 362m PO or 3091mPR followed by 81 mg po daily in addition to clopidogrel 75 mg PO daily for 21 days - Risk factor modification - Telemetry monitoring - Speech consult as needed - Stroke team to follow  Pt seen by NP/Neuro and later by MD. Note/plan to be edited by MD as needed.  StAnibal HendersonAGAC-NP [facility] Pager: [phone] patient was seen and examined with StAnibal HendersonAGAC-NP. The chart and diagnostic studies were reviewed, discussed assessment and agree with plan as plan as outlined in the note above.

## 2021-04-16 NOTE — ED Notes (Signed)
Pt brought back to room 37 at this time. NAD, a/ox4, clear speech noted. Per pt and husband, pt was on phone with her son when she could not speak. Pt states she knew what she wanted to say but it would not come out. LKWT 1300. Pt states she currently has no symptoms and feels fine, stating this has happened 4 times previously over the years. NIHSS 0.

## 2021-04-16 NOTE — ED Provider Notes (Signed)
I saw and evaluated the patient, reviewed the resident's note and I agree with the findings and plan.  EKG Interpretation  Date/Time:  Saturday April 16 2021 14:52:45 EST Ventricular Rate:  86 PR Interval:  181 QRS Duration: 80 QT Interval:  374 QTC Calculation: 448 R Axis:   58 Text Interpretation: Sinus rhythm Atrial premature complex Biatrial enlargement No significant change since last tracing Confirmed by Fredia Sorrow [phone]) on 04/16/2021 3:48:09 PM   CRITICAL CARE Performed by: Fredia Sorrow Total critical care time: 40 minutes Critical care time was exclusive of separately billable procedures and treating other patients. Critical care was necessary to treat or prevent imminent or life-threatening deterioration. Critical care was time spent personally by me on the following activities: development of treatment plan with patient and/or surrogate as well as nursing, discussions with consultants, evaluation of patient's response to treatment, examination of patient, obtaining history from patient or surrogate, ordering and performing treatments and interventions, ordering and review of laboratory studies, ordering and review of radiographic studies, pulse oximetry and re-evaluation of patient's condition.  Patient seen by me along with the emergency medicine resident.  Patient arrived as a code stroke.  Patient at 1230 at home started having speech difficulties.  Patient's husband stated that by time EMS picked her up speech was starting to come back.  Everything is resolved now.  No other weakness or complaints.  Seen by the stroke team upon arrival.  Had CT head and CTAs.  Without any acute findings.  Questionable history of a CVA many years ago.  CT head no acute findings.  CTA head and neck still pending.  Patient's neuro exam is now normal.  Discussed with Dr. Theda Sers neuro hospitalist.  Recommending internal medicine admission for TIA mini stroke.   Fredia Sorrow,  MD 04/16/21 [phone]

## 2021-04-16 NOTE — ED Triage Notes (Signed)
Pt BIB [facility] as a code stroke activated by EMS PTA. Pt w/ LKN of 1300. Pt on phone with son watching football game unable to communicate and follow commands. This prompted husband to call EMS. EMS noted expressive aphasia. VSS. Pt taken to CT 2 upon arrival.

## 2021-04-16 NOTE — H&P (Signed)
History and Physical    Deborah [name] WUJ:[id number] DOB: [birth year] DOA: 04/16/2021  PCP: Unk Pinto, MD (Confirm with patient/family/NH records and if not entered, this has to be entered at [facility] point of entry) Patient coming from: Home  I have personally briefly reviewed patient's old medical records in [place]  Chief Complaint: Feeling ok  HPI: Deborah [name] is a 75 y.o. female with medical history significant of TIA this year, anxiety/depression, HLD, osteoporosis, presented with TIA-like symptoms.  Around 1 PM this afternoon, suddenly became having trouble word finding.  No problem understanding family members or form sentences increment.  Denied any numbness tingling weakness of any of the limbs no headaches no vision problems.  Symptoms lasted about 1 hour and resolved by itself after arrival in ED.  Patient had a similar episode in May this year, did not come to ER at that point.  Plan to see next day and was told she had " mini stroke".  She chronically takes aspirin every day.  ED Course: Neuro intact, CT head negative for acute findings.  Blood pressure elevated.   Review of Systems: As per HPI otherwise 14 point review of systems negative.    Past Medical History:  Diagnosis Date   Allergy    Anemia    Anxiety    Aortic atherosclerosis ([place]) 11/2016   Noted on CT abd   Arthritis    hands and knees   Bilateral cataracts    History of   Cellulitis    Right finger   Cerebral amyloid angiopathy (CODE)    Cervical spondylosis 12/2008   Noted on Cervical spine   Chronic nausea    Compression fracture of T12 vertebra ([place]) 06/07/2017   Mild   DDD (degenerative disc disease), cervical 12/2008   Noted on Cervical spine   Delusions ([place]) 03/24/2018   Depression    Encephalopathy 01/02/2017   Mild, [place] [facility]   GERD (gastroesophageal reflux disease)    Helicobacter pylori infection 2018   History of appendicitis    age 63   History of colon  polyps    History of confusion    History of hiatal hernia 10/16/2016   Noted on Endoscopy   Hyperlipidemia    Incontinence in female 08/06/2014   Internal hemorrhoid 08/02/2017   Noted on Colonoscopy   Low blood pressure reading    Migraines    Osteopenia    PONV (postoperative nausea and vomiting)    Pre-diabetes    RLS (restless legs syndrome)    Seizure ([place])    atypical partial comples vs. confusional migraines   Seizure disorder ([place]) 12/15/2016   Skin cancer    right lower eyelid   Squamous cell skin cancer    history of neck and upper back   Syncope 03/25/2018   TIA (transient ischemic attack)    questionable per patient   Vitamin D deficiency     Past Surgical History:  Procedure Laterality Date   APPENDECTOMY  1959   CATARACT EXTRACTION, BILATERAL Bilateral 2014   CESAREAN SECTION  1978   COLONOSCOPY W/ POLYPECTOMY  08/02/2017   KNEE ARTHROSCOPY Bilateral 04/2014   [facility]   MOHS SURGERY Right 04/2013   Squamous cell cacinoma, neck/upper back   SPINE SURGERY  2010   C6-C7 fusion   TOTAL KNEE ARTHROPLASTY Left 03/22/2018   Procedure: LEFT TOTAL KNEE ARTHROPLASTY;  Surgeon: Dorna Leitz, MD;  Location: [facility];  Service: Orthopedics;  Laterality: Left;  Adductor  Block   UPPER GI ENDOSCOPY  10/16/2016     reports that she quit smoking about 35 years ago. Her smoking use included cigarettes. She has a 4.00 pack-year smoking history. She has never used smokeless tobacco. She reports that she does not currently use alcohol. She reports that she does not use drugs.  Allergies  Allergen Reactions   Banana Nausea And Vomiting   Chocolate Other (See Comments)    Unknown   Effexor [Venlafaxine] Other (See Comments)    insomnia   Maxalt [Rizatriptan Benzoate] Nausea Only and Other (See Comments)    Nausea and excessive salivation   Other Other (See Comments)    NUTS (all kinds)   Tussin [Guaifenesin] Rash   Zostavax [Zoster Vaccine Live] Itching and Rash     Family History  Problem Relation Age of Onset   Diabetes Mother    Heart disease Mother    Heart disease Father    Stroke Father    Hypertension Father    Hyperlipidemia Sister    Hypertension Brother      Prior to Admission medications   Medication Sig Start Date End Date Taking? Authorizing Provider  alendronate (FOSAMAX) 70 MG tablet Take      1 tablet      every 7 days       with a full glass of water       on an empty stomach       for 60 minutes 01/31/20   Unk Pinto, MD  ALPRAZolam Duanne Moron) 0.5 MG tablet TAKE 1/2 TO 1 TABLET BY MOUTH 2 TO 3 TIMES ONLY IF NEEDED FOR ANXIETY ATTACK AND LIMIT TO 5 DAYS A WEEK TO AVOID ADDICTION 02/09/21   Unk Pinto, MD  aspirin EC 81 MG tablet Take 1 tablet (81 mg total) by mouth daily. 11/30/20   Donato Heinz, MD  busPIRone (BUSPAR) 10 MG tablet Take  1 tablet  3 x /day  as needed for Nerves                                          /         TAKE 1 TABLET BY MOUTH THREE TIMES DAILY 01/25/21   Unk Pinto, MD  Cholecalciferol (VITAMIN D3) 125 MCG (5000 UT) CAPS Take 5,000 Units by mouth daily.    [provider]  escitalopram (LEXAPRO) 10 MG tablet Take 1 tablet (10 mg total) by mouth daily. 06/14/20 06/14/21  Garnet Sierras, NP  ezetimibe (ZETIA) 10 MG tablet TAKE 1 TABLET BY MOUTH ONCE DAILY FOR CHOLESTEROL 12/28/20   Magda Bernheim, NP  Multiple Vitamins-Minerals (MULTIVITAMIN PO) Take 1 tablet by mouth daily.    [provider]  rosuvastatin (CRESTOR) 20 MG tablet TAKE 1 TABLET BY MOUTH ONCE DAILY FOR CHOLESTEROL 12/28/20   Magda Bernheim, NP  SUMAtriptan (IMITREX) 20 MG/ACT nasal spray USE ONE DOSE IN THE NOSE NOW, MAY REPEAT IN 2 HOURS. MAX OF 2 DOSES IN 24 HOURS. Patient taking differently: Place 20 mg into the nose every 2 (two) hours as needed for migraine. 02/05/17   Vladimir Crofts, PA-C    Physical Exam: Vitals:   04/16/21 1454 04/16/21 1455 04/16/21 1500 04/16/21 1515  BP:   (!) 149/86 (!)  159/81  Pulse:   85 85  Resp:   18 17  Temp: 98.3 F (36.8 C)  TempSrc: Oral     SpO2:   98% 98%  Weight:  61.2 kg    Height:  5\' 7"  (1.702 m)      Constitutional: NAD, calm, comfortable Vitals:   04/16/21 1454 04/16/21 1455 04/16/21 1500 04/16/21 1515  BP:   (!) 149/86 (!) 159/81  Pulse:   85 85  Resp:   18 17  Temp: 98.3 F (36.8 C)     TempSrc: Oral     SpO2:   98% 98%  Weight:  61.2 kg    Height:  5\' 7"  (1.702 m)     Eyes: PERRL, lids and conjunctivae normal ENMT: Mucous membranes are moist. Posterior pharynx clear of any exudate or lesions.Normal dentition.  Neck: normal, supple, no masses, no thyromegaly Respiratory: clear to auscultation bilaterally, no wheezing, no crackles. Normal respiratory effort. No accessory muscle use.  Cardiovascular: Regular rate and rhythm, no murmurs / rubs / gallops. No extremity edema. 2+ pedal pulses. No carotid bruits.  Abdomen: no tenderness, no masses palpated. No hepatosplenomegaly. Bowel sounds positive.  Musculoskeletal: no clubbing / cyanosis. No joint deformity upper and lower extremities. Good ROM, no contractures. Normal muscle tone.  Skin: no rashes, lesions, ulcers. No induration Neurologic: CN 2-12 grossly intact. Sensation intact, DTR normal. Strength 5/5 in all 4.  Psychiatric: Normal judgment and insight. Alert and oriented x 3. Normal mood.   (Anything < 9 systems with 2 bullets each down codes to level 1) (If patient refuses exam cant bill higher level) (Make sure to document decubitus ulcers present on admission -- if possible -- and whether patient has chronic indwelling catheter at time of admission)  Labs on Admission: I have personally reviewed following labs and imaging studies  CBC: Recent Labs  Lab 04/16/21 1410 04/16/21 1412  WBC 7.1  --   NEUTROABS 4.0  --   HGB 11.8* 8.8*  HCT 35.7* 26.0*  MCV 89.9  --   PLT 325  --    Basic Metabolic Panel: Recent Labs  Lab 04/16/21 1410 04/16/21 1412   NA 134* 142  K 4.0 2.8*  CL 100 112*  CO2 26  --   GLUCOSE 103* 75  BUN 14 9  CREATININE 0.85 0.40*  CALCIUM 9.2  --    GFR: Estimated Creatinine Clearance: 58.7 mL/min (A) (by C-G formula based on SCr of 0.4 mg/dL (L)). Liver Function Tests: Recent Labs  Lab 04/16/21 1410  AST 24  ALT 15  ALKPHOS 66  BILITOT 0.6  PROT 6.7  ALBUMIN 3.7   No results for input(s): LIPASE, AMYLASE in the last 168 hours. No results for input(s): AMMONIA in the last 168 hours. Coagulation Profile: Recent Labs  Lab 04/16/21 1410  INR 0.9   Cardiac Enzymes: No results for input(s): CKTOTAL, CKMB, CKMBINDEX, TROPONINI in the last 168 hours. BNP (last 3 results) No results for input(s): PROBNP in the last 8760 hours. HbA1C: No results for input(s): HGBA1C in the last 72 hours. CBG: Recent Labs  Lab 04/16/21 1404  GLUCAP 82   Lipid Profile: No results for input(s): CHOL, HDL, LDLCALC, TRIG, CHOLHDL, LDLDIRECT in the last 72 hours. Thyroid Function Tests: No results for input(s): TSH, T4TOTAL, FREET4, T3FREE, THYROIDAB in the last 72 hours. Anemia Panel: No results for input(s): VITAMINB12, FOLATE, FERRITIN, TIBC, IRON, RETICCTPCT in the last 72 hours. Urine analysis:    Component Value Date/Time   COLORURINE YELLOW 10/07/2020 [place] 10/07/2020 1429   LABSPEC 1.019 10/07/2020 1429  PHURINE 6.0 10/07/2020 1429   GLUCOSEU NEGATIVE 10/07/2020 1429   HGBUR NEGATIVE 10/07/2020 1429   BILIRUBINUR NEGATIVE 03/25/2018 0921   KETONESUR NEGATIVE 10/07/2020 1429   PROTEINUR TRACE (A) 10/07/2020 1429   UROBILINOGEN 0.2 10/12/2014 1029   NITRITE NEGATIVE 10/07/2020 1429   LEUKOCYTESUR NEGATIVE 10/07/2020 1429    Radiological Exams on Admission: CT HEAD CODE STROKE WO CONTRAST  Result Date: 04/16/2021 CLINICAL DATA:  Code stroke. Neuro deficit, acute, stroke suspected. EXAM: CT HEAD WITHOUT CONTRAST TECHNIQUE: Contiguous axial images were obtained from the base of the  skull through the vertex without intravenous contrast. COMPARISON:  CT head without contrast 03/25/2018 FINDINGS: Brain: Mild atrophy and generalized white matter disease is similar the prior exams. No acute infarct, hemorrhage, or mass lesion is present. Basal ganglia are intact. Insular ribbon is normal. No acute or focal cortical abnormality is present. The brainstem and cerebellum are within normal limits. Vascular: Atherosclerotic calcifications are present within the cavernous internal carotid arteries bilaterally and at the dural margin of both vertebral arteries. Skull: Calvarium is intact. No focal lytic or blastic lesions are present. No significant extracranial soft tissue lesion is present. Sinuses/Orbits: The paranasal sinuses and mastoid air cells are clear. The globes and orbits are within normal limits. ASPECTS [facility] Stroke Program Early CT Score) - Ganglionic level infarction (caudate, lentiform nuclei, internal capsule, insula, M1-M3 cortex): 7/7 - Supraganglionic infarction (M4-M6 cortex): 3/3 Total score (0-10 with 10 being normal): 10/10 IMPRESSION: 1. Stable mild atrophy and generalized white matter disease. This likely reflects the sequela of chronic microvascular ischemia. 2. No acute intracranial abnormality or significant interval change. 3. ASPECTS is 10/10. Electronically Signed   By: San Morelle M.D.   On: 04/16/2021 14:18   CT ANGIO HEAD NECK W WO CM (CODE STROKE)  Result Date: 04/16/2021 CLINICAL DATA:  Acute onset of expressive aphasia. Patient has since returned to baseline. EXAM: CT ANGIOGRAPHY HEAD AND NECK TECHNIQUE: Multidetector CT imaging of the head and neck was performed using the standard protocol during bolus administration of intravenous contrast. Multiplanar CT image reconstructions and MIPs were obtained to evaluate the vascular anatomy. Carotid stenosis measurements (when applicable) are obtained utilizing NASCET criteria, using the distal internal  carotid diameter as the denominator. CONTRAST:  132mL OMNIPAQUE IOHEXOL 350 MG/ML SOLN COMPARISON:  CT head without contrast 04/16/2021 FINDINGS: CTA NECK FINDINGS Aortic arch: A 3 vessel arch configuration is present. Atherosclerotic changes are present at the arch without aneurysm or stenosis. Right carotid system: The right common carotid artery is within normal limits. Bifurcation is unremarkable. The cervical right ICA is within normal limits. Left carotid system: The left common carotid artery is within normal limits. Atherosclerotic calcifications are present at the bifurcation. Mild tortuosity is present cervical left ICA without significant stenosis. Vertebral arteries: The left vertebral artery is dominant. Both vertebral arteries originate from the subclavian arteries without significant stenosis. No significant stenosis is present in either vertebral artery in the neck. Skeleton: Cervical fusion is noted at C6-7. Ankylosis present at C4-5. No focal osseous lesions are present. Other neck: Soft tissues the neck are otherwise unremarkable. Salivary glands are within normal limits. Thyroid is normal. No significant adenopathy is present. No focal mucosal or submucosal lesions are present. Upper chest: The lung apices are clear. Thoracic inlet is within normal limits. Review of the MIP images confirms the above findings CTA HEAD FINDINGS Anterior circulation: Minimal atherosclerotic calcifications are present within the right cavernous internal carotid artery. No significant stenosis is present  through the ICA termini. The A1 and M1 segments are normal. The right A1 segment is dominant. The anterior communicating artery is patent. MCA bifurcations are intact. The ACA and MCA branch vessels are within normal limits. Posterior circulation: The left vertebral artery is the dominant vessel. Calcifications are present at the dural margins of both vertebral arteries without significant stenosis. PICA origins are  visualized and normal. The vertebrobasilar junction is normal. Basilar artery is within normal limits. Both posterior cerebral arteries originate from basilar tip. Right posterior communicating artery contributes. The PCA branch vessels are within normal limits bilaterally. Venous sinuses: The dural sinuses are patent. The straight sinus deep cerebral veins are intact. Cortical veins are within normal limits. No significant vascular malformation is evident. Anatomic variants: None Review of the MIP images confirms the above findings IMPRESSION: 1. No emergent large vessel occlusion. 2. Minimal atherosclerotic changes at the left carotid bifurcation and right cavernous internal carotid artery without significant stenosis. 3. No significant proximal stenosis, aneurysm, or branch vessel occlusion within the Circle of Willis. 4. Cervical fusion at C6-7. Ankylosis at C4-5. Electronically Signed   By: San Morelle M.D.   On: 04/16/2021 15:50    EKG: Independently reviewed.  Sinus, no acute ST changes  Assessment/Plan Active Problems:   * No active hospital problems. *  (please populate well all problems here in Problem List. (For example, if patient is on BP meds at home and you resume or decide to hold them, it is a problem that needs to be her. Same for CAD, COPD, HLD and so on)  Acute dysarthria -Secondary to TIA, ABCD2=5, 7 days stroke risk 5.9%, 90 days stroke risk 9.8%. -Continue stroke work-up, brain MRI, echocardiogram telemetry monitoring x24 hours. -Blood pressure.  Elevated, no history of HTN, as needed hydralazine for now. -Aspirin Plavix dual antiplatelet regimen x21 days and then back to aspirin alone regimen.  HLD -Check lipid panel -Continue ezetimibe  Anxiety/depression -Continue SSRI  DVT prophylaxis: Lovenox Code Status: Full code Family Communication: Husband at bedside Disposition Plan: Expect less than 2 midnight hospital stay Consults called: Neurology Admission  status: Telemetry observation   Lequita Halt MD [facility] Pager [phone]  04/16/2021, 4:09 PM

## 2021-04-17 ENCOUNTER — Observation Stay (HOSPITAL_COMMUNITY): Payer: Medicare PPO

## 2021-04-17 ENCOUNTER — Encounter (HOSPITAL_COMMUNITY): Payer: Self-pay | Admitting: Internal Medicine

## 2021-04-17 ENCOUNTER — Observation Stay (HOSPITAL_BASED_OUTPATIENT_CLINIC_OR_DEPARTMENT_OTHER): Payer: Medicare PPO

## 2021-04-17 DIAGNOSIS — R479 Unspecified speech disturbances: Secondary | ICD-10-CM | POA: Diagnosis not present

## 2021-04-17 DIAGNOSIS — G459 Transient cerebral ischemic attack, unspecified: Secondary | ICD-10-CM | POA: Diagnosis not present

## 2021-04-17 LAB — LIPID PANEL
Cholesterol: 160 mg/dL (ref 0–200)
HDL: 71 mg/dL (ref >40)
LDL Cholesterol: 77 mg/dL (ref 0–99)
Total CHOL/HDL Ratio: 2.3 RATIO
Triglycerides: 62 mg/dL (ref <150)
VLDL: 12 mg/dL (ref 0–40)

## 2021-04-17 LAB — ECHOCARDIOGRAM COMPLETE
Area-P 1/2: 3.72 cm2
Height: 67 in
S' Lateral: 2 cm
Weight: 2160 oz

## 2021-04-17 LAB — HEMOGLOBIN A1C
Hgb A1c MFr Bld: 5.8 % — ABNORMAL HIGH (ref 4.8–5.6)
Mean Plasma Glucose: 119.76 mg/dL

## 2021-04-17 MED ORDER — ASPIRIN EC 81 MG PO TBEC
81.0000 mg | DELAYED_RELEASE_TABLET | Freq: Every day | ORAL | Status: DC
  Administered 2021-04-17: 81 mg via ORAL
  Filled 2021-04-17: qty 1

## 2021-04-17 NOTE — Progress Notes (Signed)
EEG complete - results pending 

## 2021-04-17 NOTE — Evaluation (Signed)
Occupational Therapy Evaluation Patient Details Name: Deborah [name] MRN: [id number] DOB: 12/23/1945 Today's Date: 04/17/2021   History of Present Illness 76yo female who presented on 12/31 with TIA like symptoms including acute word finding problems. Seemed to resolve shortly before arrival to ED. CT/MRI negative for acute findings. PMH anxiety, aortic atherosclerosis, cerebral amylod angiopathy, T12 compression fracture, delusions, HLD, seizures, syncope, TIA, C6 fusion, TKA   Clinical Impression   Deborah [name] reports being indep PTA. She lives in a one level home with her husband who is able to assist as needed. Upon evaluation pt demonstrated indep ability to complete ADLs and functional mobility. Pt admits this is not the first time she has experienced speech-associated symptoms, and denies any other stroke-like symptoms. Pt educated on BE FAST and verbalized great understanding. Pt does not have any further acute OT needs. Recommend d/d home with support of her husband.      Recommendations for follow up therapy are one component of a multi-disciplinary discharge planning process, led by the attending physician.  Recommendations may be updated based on patient status, additional functional criteria and insurance authorization.   Follow Up Recommendations  No OT follow up    Assistance Recommended at Discharge PRN  Functional Status Assessment  Patient has had a recent decline in their functional status and demonstrates the ability to make significant improvements in function in a reasonable and predictable amount of time.  Equipment Recommendations  None recommended by OT       Precautions / Restrictions Precautions Precautions: None Restrictions Weight Bearing Restrictions: No      Mobility Bed Mobility Overal bed mobility: Independent                  Transfers Overall transfer level: Independent Equipment used: None                      Balance Overall  balance assessment: Independent                                         ADL either performed or assessed with clinical judgement   ADL Overall ADL's : Independent;At baseline         General ADL Comments: demonstrated indep ability to complete ADLs and functional mobility. Pt states she is moving "a little slower" than usual - likely from laying in bed.     Vision Baseline Vision/History: 0 No visual deficits Ability to See in Adequate Light: 0 Adequate Patient Visual Report: No change from baseline Vision Assessment?: No apparent visual deficits            Pertinent Vitals/Pain Pain Assessment: No/denies pain     Hand Dominance Right   Extremity/Trunk Assessment Upper Extremity Assessment Upper Extremity Assessment: Overall WFL for tasks assessed   Lower Extremity Assessment Lower Extremity Assessment: Overall WFL for tasks assessed   Cervical / Trunk Assessment Cervical / Trunk Assessment: Normal   Communication Communication Communication: Expressive difficulties (mild. only 2 episodes of mixing up words noted.)   Cognition Arousal/Alertness: Awake/alert Behavior During Therapy: WFL for tasks assessed/performed Overall Cognitive Status: Within Functional Limits for tasks assessed                 General Comments  VSS on RA. educated pt on BE FAST            Home Living Family/patient expects to be  discharged to:: Private residence Living Arrangements: Spouse/significant other Available Help at Discharge: Family;Available 24 hours/day Type of Home: House Home Access: Stairs to enter [employer] of Steps: 1 ste no rail but can grab door frame   Home Layout: One level     Bathroom Shower/Tub: Tub/shower unit;Walk-in shower   Bathroom Toilet: Standard     Home Equipment: Grab bars - tub/shower;Rolling Environmental consultant (2 wheels)   Additional Comments: loves getting into tub to take baths; still driving; manages own meds       Prior Functioning/Environment Prior Level of Function : Independent/Modified Independent             Mobility Comments: no AD ADLs Comments: drives                 OT Goals(Current goals can be found in the care plan section) Acute Rehab OT Goals Patient Stated Goal: home OT Goal Formulation: All assessment and education complete, DC therapy Time For Goal Achievement: 04/17/21      AM-PAC OT "6 Clicks" Daily Activity     Outcome Measure Help from another person eating meals?: None Help from another person taking care of personal grooming?: None Help from another person toileting, which includes using toliet, bedpan, or urinal?: None Help from another person bathing (including washing, rinsing, drying)?: None Help from another person to put on and taking off regular upper body clothing?: None Help from another person to put on and taking off regular lower body clothing?: None 6 Click Score: 24   End of Session Equipment Utilized During Treatment: Gait belt Nurse Communication: Mobility status (pt is indep)  Activity Tolerance: Patient tolerated treatment well Patient left: in bed;with call bell/phone within reach  OT Visit Diagnosis: Other abnormalities of gait and mobility (R26.89)                Time: 6568-1275 OT Time Calculation (min): 14 min Charges:  OT General Charges $OT Visit: 1 Visit OT Evaluation $OT Eval Low Complexity: 1 Low   Kagen Kunath A Deidrea Gaetz 04/17/2021, 1:27 PM

## 2021-04-17 NOTE — Progress Notes (Addendum)
STROKE TEAM PROGRESS NOTE   INTERVAL HISTORY Difficulty getting words out that fluctuated, still has some hesitancy and paucity of speech. Mother died of stroke, not sure what kind. Hx of migraine, memory loss, possible CAA seen on MRIs in 2018. Probable CAA based on [place] Criteria. Patient has been on a daily ASA 87m for her TIAs. Discussed the importance of blood pressure control and modifiable risk factors for strokes.     Vitals:   04/16/21 1515 04/16/21 1900 04/17/21 0000 04/17/21 0644  BP: (!) 159/81 (!) 149/82 138/68 137/86  Pulse: 85 95 80 80  Resp: 17 (!) 28 18 16   Temp:      TempSrc:      SpO2: 98% 100% 99% 98%  Weight:      Height:       CBC:  Recent Labs  Lab 04/16/21 1410 04/16/21 1412  WBC 7.1  --   NEUTROABS 4.0  --   HGB 11.8* 8.8*  HCT 35.7* 26.0*  MCV 89.9  --   PLT 325  --    Basic Metabolic Panel:  Recent Labs  Lab 04/16/21 1410 04/16/21 1412  NA 134* 142  K 4.0 2.8*  CL 100 112*  CO2 26  --   GLUCOSE 103* 75  BUN 14 9  CREATININE 0.85 0.40*  CALCIUM 9.2  --    Lipid Panel: No results for input(s): CHOL, TRIG, HDL, CHOLHDL, VLDL, LDLCALC in the last 168 hours. HgbA1c:  Recent Labs  Lab 04/17/21 0645  HGBA1C 5.8*   Urine Drug Screen: No results for input(s): LABOPIA, COCAINSCRNUR, LABBENZ, AMPHETMU, THCU, LABBARB in the last 168 hours.  Alcohol Level No results for input(s): ETH in the last 168 hours.  IMAGING past 24 hours MR BRAIN WO CONTRAST  Result Date: 04/16/2021 CLINICAL DATA:  Transient ischemic attack (TIA). Transient word-finding difficulty. EXAM: MRI HEAD WITHOUT CONTRAST TECHNIQUE: Multiplanar, multiecho pulse sequences of the brain and surrounding structures were obtained without intravenous contrast. COMPARISON:  Head CT and CTA 04/16/2021.  Head MRI 12/16/2016. FINDINGS: Brain: There is no evidence of an acute infarct, mass, midline shift, or extra-axial fluid collection. Widespread chronic microhemorrhages located  peripherally throughout both cerebral hemispheres of mildly increased in number from 2018. A slightly larger subcentimeter chronic hemorrhage is again noted in the posterior left temporal lobe with mild adjacent gliosis. Patchy to confluent T2 hyperintensities elsewhere in the cerebral white matter bilaterally have mildly progressed from the prior MRI and are nonspecific but compatible with severe chronic small vessel ischemic disease. Moderate chronic small vessel changes are present in the pons. There is mild generalized cerebral atrophy. Vascular: Major intracranial vascular flow voids are preserved. Skull and upper cervical spine: Unremarkable bone marrow signal para Sinuses/Orbits: Bilateral cataract extraction. Paranasal sinuses and mastoid air cells are clear. Other: None. IMPRESSION: 1. No acute intracranial abnormality. 2. Severe chronic small vessel ischemic disease, mildly progressed from 2018. 3. Innumerable chronic cerebral microhemorrhages most suggestive of cerebral amyloid angiopathy, mildly increased from 2018. Electronically Signed   By: ALogan BoresM.D.   On: 04/16/2021 20:44   CT HEAD CODE STROKE WO CONTRAST  Result Date: 04/16/2021 CLINICAL DATA:  Code stroke. Neuro deficit, acute, stroke suspected. EXAM: CT HEAD WITHOUT CONTRAST TECHNIQUE: Contiguous axial images were obtained from the base of the skull through the vertex without intravenous contrast. COMPARISON:  CT head without contrast 03/25/2018 FINDINGS: Brain: Mild atrophy and generalized white matter disease is similar the prior exams. No acute infarct, hemorrhage, or mass  lesion is present. Basal ganglia are intact. Insular ribbon is normal. No acute or focal cortical abnormality is present. The brainstem and cerebellum are within normal limits. Vascular: Atherosclerotic calcifications are present within the cavernous internal carotid arteries bilaterally and at the dural margin of both vertebral arteries. Skull: Calvarium is  intact. No focal lytic or blastic lesions are present. No significant extracranial soft tissue lesion is present. Sinuses/Orbits: The paranasal sinuses and mastoid air cells are clear. The globes and orbits are within normal limits. ASPECTS [facility] Stroke Program Early CT Score) - Ganglionic level infarction (caudate, lentiform nuclei, internal capsule, insula, M1-M3 cortex): 7/7 - Supraganglionic infarction (M4-M6 cortex): 3/3 Total score (0-10 with 10 being normal): 10/10 IMPRESSION: 1. Stable mild atrophy and generalized white matter disease. This likely reflects the sequela of chronic microvascular ischemia. 2. No acute intracranial abnormality or significant interval change. 3. ASPECTS is 10/10. Electronically Signed   By: San Morelle M.D.   On: 04/16/2021 14:18   CT ANGIO HEAD NECK W WO CM (CODE STROKE)  Result Date: 04/16/2021 CLINICAL DATA:  Acute onset of expressive aphasia. Patient has since returned to baseline. EXAM: CT ANGIOGRAPHY HEAD AND NECK TECHNIQUE: Multidetector CT imaging of the head and neck was performed using the standard protocol during bolus administration of intravenous contrast. Multiplanar CT image reconstructions and MIPs were obtained to evaluate the vascular anatomy. Carotid stenosis measurements (when applicable) are obtained utilizing NASCET criteria, using the distal internal carotid diameter as the denominator. CONTRAST:  147m OMNIPAQUE IOHEXOL 350 MG/ML SOLN COMPARISON:  CT head without contrast 04/16/2021 FINDINGS: CTA NECK FINDINGS Aortic arch: A 3 vessel arch configuration is present. Atherosclerotic changes are present at the arch without aneurysm or stenosis. Right carotid system: The right common carotid artery is within normal limits. Bifurcation is unremarkable. The cervical right ICA is within normal limits. Left carotid system: The left common carotid artery is within normal limits. Atherosclerotic calcifications are present at the bifurcation. Mild  tortuosity is present cervical left ICA without significant stenosis. Vertebral arteries: The left vertebral artery is dominant. Both vertebral arteries originate from the subclavian arteries without significant stenosis. No significant stenosis is present in either vertebral artery in the neck. Skeleton: Cervical fusion is noted at C6-7. Ankylosis present at C4-5. No focal osseous lesions are present. Other neck: Soft tissues the neck are otherwise unremarkable. Salivary glands are within normal limits. Thyroid is normal. No significant adenopathy is present. No focal mucosal or submucosal lesions are present. Upper chest: The lung apices are clear. Thoracic inlet is within normal limits. Review of the MIP images confirms the above findings CTA HEAD FINDINGS Anterior circulation: Minimal atherosclerotic calcifications are present within the right cavernous internal carotid artery. No significant stenosis is present through the ICA termini. The A1 and M1 segments are normal. The right A1 segment is dominant. The anterior communicating artery is patent. MCA bifurcations are intact. The ACA and MCA branch vessels are within normal limits. Posterior circulation: The left vertebral artery is the dominant vessel. Calcifications are present at the dural margins of both vertebral arteries without significant stenosis. PICA origins are visualized and normal. The vertebrobasilar junction is normal. Basilar artery is within normal limits. Both posterior cerebral arteries originate from basilar tip. Right posterior communicating artery contributes. The PCA branch vessels are within normal limits bilaterally. Venous sinuses: The dural sinuses are patent. The straight sinus deep cerebral veins are intact. Cortical veins are within normal limits. No significant vascular malformation is evident. Anatomic variants: None  Review of the MIP images confirms the above findings IMPRESSION: 1. No emergent large vessel occlusion. 2.  Minimal atherosclerotic changes at the left carotid bifurcation and right cavernous internal carotid artery without significant stenosis. 3. No significant proximal stenosis, aneurysm, or branch vessel occlusion within the Circle of Willis. 4. Cervical fusion at C6-7. Ankylosis at C4-5. Electronically Signed   By: San Morelle M.D.   On: 04/16/2021 15:50    PHYSICAL EXAM  Temp:  [98.3 F (36.8 C)] 98.3 F (36.8 C) (12/31 1454) Pulse Rate:  [80-99] 80 (01/01 0644) Resp:  [16-28] 16 (01/01 0644) BP: (137-159)/(68-93) 137/86 (01/01 0644) SpO2:  [98 %-100 %] 98 % (01/01 0644) Weight:  [61.2 kg-68.6 kg] 61.2 kg (12/31 1455)   Physical Exam  Constitutional: Appears well-developed and well-nourished.  Psych: anxious, forgetful   Neuro: Mental Status: Patient is awake, alert, oriented to person, place, month, year, and situation. Long term memory recall is affected, short-term memory intact No signs of aphasia or neglect Cranial Nerves: II: Visual Fields are full. Pupils are equal, round, and reactive to light.    III,IV, VI: EOMI without ptosis or diploplia.  V: Facial sensation is symmetric to temperature VII: Facial movement is symmetric resting and smiling VIII: Hearing is intact to voice X: Palate elevates symmetrically XI: Shoulder shrug is symmetric. XII: Tongue protrudes midline without atrophy or fasciculations.  Motor: Tone is normal. Bulk is normal. 5/5 strength was present in all four extremities.  Sensory: Sensation is symmetric to light touch and temperature in the arms and legs. No extinction to DSS present.  Coordination: FNF and HKS are intact bilaterally  NIHSS: 0  ASSESSMENT/PLAN DLISA BARNWELL is a 76 y.o. female with a medical history significant for essential hypertension, hyperlipidemia, anemia, pre-diabetes, anxiety, GERD, and migraines who presented to the ED 12/31 via EMS for evaluation of word-finding difficulties. Patient was on the telephone with  her son this afternoon and at 13:00, she began having difficulty getting her words out and communicating with him. He attempted to her Ms. Clere to go alert her husband but was unsuccessful. Eventually, when her husband was alerted, EMS was activated. EMS states that on arrival to her home, the word-finding difficulties had subsided but that these events recurred and resolved two separate times en route to the hospital. Neurology met the patient at the bridge for further evaluation of Code Stroke.    Of note, patient was seen on 02/22/2021 by her PCP for evaluation of a similar presentation of 2 episodes of 10 minute word-finding difficulties within the week of evaluation without further focal neurologic signs or symptoms. During this evaluation, her provider felt that her word-finding difficulties were secondary to TIA. Patient states that she has had headaches associated with word-finding difficulties in the past but denies a current headache. Her provider noted that in 2018, patient had episodes of confusion that were felt to be most likely related to "confusional migraines versus atypical seizures" and was treated with Topamax and then Keppra before discontinuation of both in 2020.   Stroke:  chronic cerebral microhemorrhages likely secondary cerebral amyloid angiopathy source Code Stroke -small vessel disease. Atrophy. ASPECTS 10.    CTA head & neck -Minimal atherosclerotic changes at the left carotid bifurcation and right cavernous internal carotid artery without significant stenosis. Minimal atherosclerotic changes at the left carotid bifurcation and right cavernous internal carotid artery without significant stenosis. MRI- Severe chronic small vessel ischemic disease, mildly progressed from 2018. Innumerable chronic cerebral microhemorrhages most  suggestive of cerebral amyloid angiopathy, mildly increased from 2018. 2D Echo- EF 55 to 60%. The left ventricle has normal function. The left ventricle has  no regional wall motion abnormalities LDL 63 HgbA1c 5.8 aspirin 81 mg daily prior to admission, now on aspirin 81 mg daily. No DAPT given microhemorrhages Therapy recommendations:  No follow up recommended Disposition:  Pending  Previous TIA  November 2022- Recurrent episodes word finding difficulties  2018- Episode of confusion Thought to be a symptom of neurodegenerative condition, Topamax discontinued and Depakote started 500 mg twice daily.  Patient cannot remember the last time she took this medication and she does not remember this hospital admission.  Hypertension Home meds:  None Stable Maintain Bp systolic under 856  Hyperlipidemia Home meds:  Rosuvastatin 47m Zetia 138m resumed in hospital LDL 63, goal < 70  Other Stroke Risk Factors Advanced Age >/= [age]Former Cigarette smoker, quit 35 years ago Hx stroke/TIA November- 10 min episode of difficulty with word finding 2018[facility] Migraines In 2018- episode of confusion that was diagnosed as atypical seizure vs atypical/confusional migraine  Home medication: PRN sumatriptan   Other Active Problems- managed by primary team Anxiety and depression Home medications Xanax, buspar, lexapro GERD Osteoarthritis Fosamax  Hospital day # 0  Patient seen and examined by NP/APP with MD. MD to update note as needed.   DeJanine OresDNP, FNP-BC [facility] Pager: [phone] ATTESTATION:  CAA on MRI but neg for CVA. Stressed importance of BP control. Risk of bleeding with aspirin vs ischemic stroke prevention. She understands the risk. Exam is normal. She likely has cognitive decline. She will need outpt neurology follow-up to explore this further.   Dr. PaReeves Forthvaluated pt independently, reviewed imaging, chart, labs. Discussed and formulated plan with the APP. Please see APP note above for details.   Total 30 minutes spent on  counseling patient and coordinating care, writing notes and reviewing chart.  Fransico Sciandra,MD    To contact Stroke Continuity provider, please refer to [email or web address] hours, contact General Neurology

## 2021-04-17 NOTE — Progress Notes (Signed)
Patient brought up by ED nurse. MD called within 10-15 minutes and advised the patient was supposed to be dc from the ED with the RN being aware. The patient has been discharged before any care was ordered or given. This RN did a stroke assessment and a head to toe. Patient is A&OX4.

## 2021-04-17 NOTE — Discharge Summary (Signed)
Physician Discharge Summary  Deborah [name] TIR:[id number] DOB: 12-17-1945 DOA: 04/16/2021  PCP: Unk Pinto, MD  Admit date: 04/16/2021 Discharge date: 04/17/2021  Admitted From: Home Discharge disposition: Home    Code Status: Prior   Discharge Diagnosis:   Principal Problem:   TIA (transient ischemic attack)    Chief Complaint  Patient presents with   Code Stroke   Brief narrative: Deborah [name] is a 76 y.o. female with PMH significant for prediabetes, HTN, HLD, TIA, migraine, anxiety, depression, osteoporosis. Patient presented to the ED on 12/31 by EMS for evaluation of word finding difficulties Around 1 PM in the afternoon, while talking to her son on the phone, patient began experiencing word finding difficulty and communicating with him.  EMS was activated.  By the time of their arrival, symptoms have resolved but en route to the hospital, patient experienced similar symptoms twice. Patient had similar symptoms in November, concern as TIA but was not hospitalized apparently.  She has been on aspirin daily.  In the ER, patient was afebrile, heart rate 90s, blood pressure 153/93, breathing on room air no new neurological findings. CT head negative for acute findings. CT angio of head and neck did not show any emergent large vessel occlusion or significant proximal stenosis. MRI brain did not show any acute intracranial abnormality but showed severe chronic small vessel ischemic disease. It also showed innumerable chronic cerebral microhemorrhages more suggestive of cerebral amyloid angiopathy. Admitted to hospitalist service Neurology consulted  Subjective: Patient was seen and examined this morning in the ED.  Pleasant elderly Caucasian female.  Not in distress.  No new symptoms.  Symptoms resolved.  Pending completion of work-up.  Hospital course: Stroke:  chronic cerebral microhemorrhages likely secondary cerebral amyloid angiopathy source -Presented with  multiple self-limiting episodes of word finding difficulties and confusion  -Symptoms with history of TIA.  Imagings without acute stroke. -Stroke work-up in process -Imagings as above -Echocardiogram with EF 55 to 60%, no wall motion abnormality, no evidence of interatrial shunt. -A1c 5.8, HDL 71, LDL 77. -Neurology consult appreciated. -prior to admission, patient aspirin daily.  Per neurology patient to continue on the same post discharge.  No DAPT given microhemorrhages. -PT/OT/ST eval obtained.  No follow-up required.   HLD -HDL 71, LDL 77 -Continue ezetimibe, Crestor   Anxiety/depression -Continue Lexapro, as needed Xanax, as needed BuSpar  Migraine -Imitrex  Discharge Medications:   Allergies as of 04/17/2021       Reactions   Banana Nausea And Vomiting   Chocolate Other (See Comments)   Unknown   Effexor [venlafaxine] Other (See Comments)   insomnia   Maxalt [rizatriptan Benzoate] Nausea Only, Other (See Comments)   Nausea and excessive salivation   Other Other (See Comments)   NUTS (all kinds)   Tussin [guaifenesin] Rash   Zostavax [zoster Vaccine Live] Itching, Rash        Medication List     TAKE these medications    alendronate 70 MG tablet Commonly known as: Fosamax Take      1 tablet      every 7 days       with a full glass of water       on an empty stomach       for 60 minutes   ALPRAZolam 0.5 MG tablet Commonly known as: XANAX TAKE 1/2 TO 1 TABLET BY MOUTH 2 TO 3 TIMES ONLY IF NEEDED FOR ANXIETY ATTACK AND LIMIT TO 5 DAYS A WEEK TO AVOID  ADDICTION What changed:  how much to take how to take this when to take this reasons to take this additional instructions   aspirin EC 81 MG tablet Take 1 tablet (81 mg total) by mouth daily.   busPIRone 10 MG tablet Commonly known as: BUSPAR Take  1 tablet  3 x /day  as needed for Nerves                                          /         TAKE 1 TABLET BY MOUTH THREE TIMES DAILY What changed:  how much  to take how to take this when to take this reasons to take this additional instructions   escitalopram 10 MG tablet Commonly known as: Lexapro Take 1 tablet (10 mg total) by mouth daily.   ezetimibe 10 MG tablet Commonly known as: ZETIA TAKE 1 TABLET BY MOUTH ONCE DAILY FOR CHOLESTEROL What changed: additional instructions   fluticasone 50 MCG/ACT nasal spray Commonly known as: FLONASE Place 1 spray into both nostrils 2 (two) times daily.   MULTIVITAMIN PO Take 1 tablet by mouth daily.   rosuvastatin 20 MG tablet Commonly known as: CRESTOR TAKE 1 TABLET BY MOUTH ONCE DAILY FOR CHOLESTEROL What changed: additional instructions   SUMAtriptan 20 MG/ACT nasal spray Commonly known as: IMITREX USE ONE DOSE IN THE NOSE NOW, MAY REPEAT IN 2 HOURS. MAX OF 2 DOSES IN 24 HOURS. What changed: See the new instructions.   Vitamin D3 125 MCG (5000 UT) Caps Take 5,000 Units by mouth daily.        Wound care:   Incision (Closed) 03/22/18 Knee Left (Active)  Date First Assessed/Time First Assessed: 03/22/18 0856   Location: Knee  Location Orientation: Left    Assessments 03/22/2018  9:15 AM 03/26/2018  7:51 AM  Dressing Clean;Dry;Intact Clean;Dry;Intact  Dressing Change Frequency -- PRN  Site / Wound Assessment Dressing in place / Unable to assess Dressing in place / Unable to assess  Drainage Amount None --  Treatment Ice applied --     No Linked orders to display    Discharge Instructions:   Discharge Instructions     Call MD for:  difficulty breathing, headache or visual disturbances   Complete by: As directed    Call MD for:  extreme fatigue   Complete by: As directed    Call MD for:  hives   Complete by: As directed    Call MD for:  persistant dizziness or light-headedness   Complete by: As directed    Call MD for:  persistant nausea and vomiting   Complete by: As directed    Call MD for:  severe uncontrolled pain   Complete by: As directed    Call MD for:   temperature >100.4   Complete by: As directed    Diet general   Complete by: As directed    Discharge instructions   Complete by: As directed    General discharge instructions:  Follow with Primary MD Unk Pinto, MD in 7 days   Get CBC/BMP checked in next visit within 1 week by PCP or SNF MD. (We routinely change or add medications that can affect your baseline labs and fluid status, therefore we recommend that you get the mentioned basic workup next visit with your PCP, your PCP may decide not to get them or add  new tests based on their clinical decision)  On your next visit with your PCP, please get your medicines reviewed and adjusted.  Please request your PCP  to go over all hospital tests, procedures, radiology results at the follow up, please get all Hospital records sent to your PCP by signing hospital release before you go home.  Activity: As tolerated with Full fall precautions use walker/cane & assistance as needed  Avoid using any recreational substances like cigarette, tobacco, alcohol, or non-prescribed drug.  If you experience worsening of your admission symptoms, develop shortness of breath, life threatening emergency, suicidal or homicidal thoughts you must seek medical attention immediately by calling 911 or calling your MD immediately  if symptoms less severe.  You must read complete instructions/literature along with all the possible adverse reactions/side effects for all the medicines you take and that have been prescribed to you. Take any new medicine only after you have completely understood and accepted all the possible adverse reactions/side effects.   Do not drive, operate heavy machinery, perform activities at heights, swimming or participation in water activities or provide baby sitting services if your were admitted for syncope or siezures until you have seen by Primary MD or a Neurologist and advised to do so again.  Do not drive when taking Pain  medications.  Do not take more than prescribed Pain, Sleep and Anxiety Medications  Wear Seat belts while driving.  Please note You were cared for by a hospitalist during your hospital stay. If you have any questions about your discharge medications or the care you received while you were in the hospital after you are discharged, you can call the unit and asked to speak with the hospitalist on call if the hospitalist that took care of you is not available. Once you are discharged, your primary care physician will handle any further medical issues. Please note that NO REFILLS for any discharge medications will be authorized once you are discharged, as it is imperative that you return to your primary care physician (or establish a relationship with a primary care physician if you do not have one) for your aftercare needs so that they can reassess your need for medications and monitor your lab values.   Increase activity slowly   Complete by: As directed        Follow ups:    Follow-up Information     Unk Pinto, MD Follow up.   Specialty: Internal Medicine Contact information: [address] [place] [address] [phone]         [facility] Follow up.   Specialty: Neurology Contact information: [address] [place] [phone]                Discharge Exam:   Vitals:   04/17/21 1200 04/17/21 1300 04/17/21 1400 04/17/21 1619  BP: (!) 134/93 (!) 147/89 111/62 114/62  Pulse: 82 84 91 60  Resp: 17 18 17 18   Temp:    99.1 F (37.3 C)  TempSrc:    Oral  SpO2: 96% 96% 98% 97%  Weight:      Height:        Body mass index is 21.14 kg/m.   General exam: Pleasant, elderly patient, not in distress Skin: No rashes, lesions or ulcers. HEENT: Atraumatic, normocephalic, no obvious bleeding Lungs: Clear to auscultation bilaterally CVS: Regular rate and rhythm, no murmur GI/Abd soft,  nontender, nondistended, bowel sound present CNS: Alert, awake, oriented x3 Psychiatry:  Mood appropriate Extremities: No pedal edema, no calf tenderness  Time coordinating discharge: 35 minutes   The results of significant diagnostics from this hospitalization (including imaging, microbiology, ancillary and laboratory) are listed below for reference.    Procedures and Diagnostic Studies:   MR BRAIN WO CONTRAST  Result Date: 04/16/2021 CLINICAL DATA:  Transient ischemic attack (TIA). Transient word-finding difficulty. EXAM: MRI HEAD WITHOUT CONTRAST TECHNIQUE: Multiplanar, multiecho pulse sequences of the brain and surrounding structures were obtained without intravenous contrast. COMPARISON:  Head CT and CTA 04/16/2021.  Head MRI 12/16/2016. FINDINGS: Brain: There is no evidence of an acute infarct, mass, midline shift, or extra-axial fluid collection. Widespread chronic microhemorrhages located peripherally throughout both cerebral hemispheres of mildly increased in number from 2018. A slightly larger subcentimeter chronic hemorrhage is again noted in the posterior left temporal lobe with mild adjacent gliosis. Patchy to confluent T2 hyperintensities elsewhere in the cerebral white matter bilaterally have mildly progressed from the prior MRI and are nonspecific but compatible with severe chronic small vessel ischemic disease. Moderate chronic small vessel changes are present in the pons. There is mild generalized cerebral atrophy. Vascular: Major intracranial vascular flow voids are preserved. Skull and upper cervical spine: Unremarkable bone marrow signal para Sinuses/Orbits: Bilateral cataract extraction. Paranasal sinuses and mastoid air cells are clear. Other: None. IMPRESSION: 1. No acute intracranial abnormality. 2. Severe chronic small vessel ischemic disease, mildly progressed from 2018. 3. Innumerable chronic cerebral microhemorrhages most suggestive of cerebral amyloid angiopathy, mildly  increased from 2018. Electronically Signed   By: Logan Bores M.D.   On: 04/16/2021 20:44   EEG adult  Result Date: 04/17/2021 Lora Havens, MD     04/17/2021  3:12 PM Patient Name: JOSEPHENE MARRONE MRN: [id number] Epilepsy Attending: Lora Havens Referring Physician/Provider: Janine Ores, NP Date: 04/17/2021 Duration: 23.23 mins Patient history: 76yo F with episodes of transient speech disturbance. EEG to evaluate for seizure Level of alertness: Awake AEDs during EEG study: None Technical aspects: This EEG study was done with scalp electrodes positioned according to the 10-20 International system of electrode placement. Electrical activity was acquired at a sampling rate of 500Hz  and reviewed with a high frequency filter of 70Hz  and a low frequency filter of 1Hz . EEG data were recorded continuously and digitally stored. Description: The posterior dominant rhythm consists of 8 Hz activity of moderate voltage (25-35 uV) seen predominantly in posterior head regions, symmetric and reactive to eye opening and eye closing. EEG showed intermittent 3 to 6 Hz theta-delta slowing in left and right temporal region. Hyperventilation and photic stimulation were not performed.   ABNORMALITY - Intermittent slow, left and right temporal region. IMPRESSION: This study is suggestive of cortical dysfunction arising from left and right temporal region, nonspecific etiology. No seizures or epileptiform discharges were seen throughout the recording. Lora Havens   ECHOCARDIOGRAM COMPLETE  Result Date: 04/17/2021    ECHOCARDIOGRAM REPORT   Patient Name:   ALAZAE CRYMES Date of Exam: 04/17/2021 Medical Rec #:  [id number]      Height:       67.0 in Accession #:    [id number]     Weight:       135.0 lb Date of Birth:  02/10/1946     BSA:          41.711 m Patient Age:    23 years       BP:           147/89 mmHg Patient Gender: F  HR:           91 bpm. Exam Location:  Inpatient Procedure: 2D Echo, 3D Echo, Cardiac  Doppler and Color Doppler Indications:    TIA G45.9  History:        Patient has prior history of Echocardiogram examinations, most                 recent 12/21/2020. Signs/Symptoms:Syncope; Risk                 Factors:Dyslipidemia. Seizure disorder.  Sonographer:    Darlina Sicilian RDCS Referring Phys: [id number] [place]  1. Left ventricular ejection fraction, by estimation, is 55 to 60%. The left ventricle has normal function. The left ventricle has no regional wall motion abnormalities. Left ventricular diastolic parameters were normal.  2. Right ventricular systolic function is normal. The right ventricular size is normal.  3. The mitral valve is abnormal. No evidence of mitral valve regurgitation. No evidence of mitral stenosis. Moderate mitral annular calcification.  4. The aortic valve is tricuspid. There is mild calcification of the aortic valve. Aortic valve regurgitation is not visualized. Aortic valve sclerosis is present, with no evidence of aortic valve stenosis.  5. The inferior vena cava is normal in size with greater than 50% respiratory variability, suggesting right atrial pressure of 3 mmHg. FINDINGS  Left Ventricle: Left ventricular ejection fraction, by estimation, is 55 to 60%. The left ventricle has normal function. The left ventricle has no regional wall motion abnormalities. The left ventricular internal cavity size was normal in size. There is  no left ventricular hypertrophy. Left ventricular diastolic parameters were normal. Right Ventricle: The right ventricular size is normal. No increase in right ventricular wall thickness. Right ventricular systolic function is normal. Left Atrium: Left atrial size was normal in size. Right Atrium: Right atrial size was normal in size. Pericardium: There is no evidence of pericardial effusion. Mitral Valve: The mitral valve is abnormal. There is mild thickening of the mitral valve leaflet(s). There is mild calcification of the mitral valve  leaflet(s). Moderate mitral annular calcification. No evidence of mitral valve regurgitation. No evidence  of mitral valve stenosis. Tricuspid Valve: The tricuspid valve is normal in structure. Tricuspid valve regurgitation is trivial. No evidence of tricuspid stenosis. Aortic Valve: The aortic valve is tricuspid. There is mild calcification of the aortic valve. Aortic valve regurgitation is not visualized. Aortic valve sclerosis is present, with no evidence of aortic valve stenosis. Pulmonic Valve: The pulmonic valve was normal in structure. Pulmonic valve regurgitation is not visualized. No evidence of pulmonic stenosis. Aorta: The aortic root is normal in size and structure. Venous: The inferior vena cava is normal in size with greater than 50% respiratory variability, suggesting right atrial pressure of 3 mmHg. IAS/Shunts: No atrial level shunt detected by color flow Doppler.  LEFT VENTRICLE PLAX 2D LVIDd:         3.70 cm   Diastology LVIDs:         2.00 cm   LV e' medial:    6.52 cm/s LV PW:         0.80 cm   LV E/e' medial:  11.2 LV IVS:        0.90 cm   LV e' lateral:   9.95 cm/s LVOT diam:     1.90 cm   LV E/e' lateral: 7.3 LV SV:         62 LV SV Index:   36 LVOT Area:  2.84 cm                           3D Volume EF:                          3D EF:        54 %                          LV EDV:       88 ml                          LV ESV:       40 ml                          LV SV:        47 ml RIGHT VENTRICLE RV S prime:     25.10 cm/s TAPSE (M-mode): 1.8 cm LEFT ATRIUM           Index        RIGHT ATRIUM          Index LA diam:      2.70 cm 1.58 cm/m   RA Area:     8.45 cm LA Vol (A2C): 29.9 ml 17.47 ml/m  RA Volume:   12.90 ml 7.54 ml/m LA Vol (A4C): 43.9 ml 25.66 ml/m  AORTIC VALVE LVOT Vmax:   126.00 cm/s LVOT Vmean:  81.100 cm/s LVOT VTI:    0.219 m  AORTA Ao Root diam: 2.90 cm Ao Asc diam:  3.00 cm MITRAL VALVE MV Area (PHT): 3.72 cm    SHUNTS MV Decel Time: 204 msec    Systemic VTI:  0.22 m  MV E velocity: 72.90 cm/s  Systemic Diam: 1.90 cm MV A velocity: 83.70 cm/s MV E/A ratio:  0.87 Jenkins Rouge MD Electronically signed by Jenkins Rouge MD Signature Date/Time: 04/17/2021/2:23:47 PM    Final    CT HEAD CODE STROKE WO CONTRAST  Result Date: 04/16/2021 CLINICAL DATA:  Code stroke. Neuro deficit, acute, stroke suspected. EXAM: CT HEAD WITHOUT CONTRAST TECHNIQUE: Contiguous axial images were obtained from the base of the skull through the vertex without intravenous contrast. COMPARISON:  CT head without contrast 03/25/2018 FINDINGS: Brain: Mild atrophy and generalized white matter disease is similar the prior exams. No acute infarct, hemorrhage, or mass lesion is present. Basal ganglia are intact. Insular ribbon is normal. No acute or focal cortical abnormality is present. The brainstem and cerebellum are within normal limits. Vascular: Atherosclerotic calcifications are present within the cavernous internal carotid arteries bilaterally and at the dural margin of both vertebral arteries. Skull: Calvarium is intact. No focal lytic or blastic lesions are present. No significant extracranial soft tissue lesion is present. Sinuses/Orbits: The paranasal sinuses and mastoid air cells are clear. The globes and orbits are within normal limits. ASPECTS [facility] Stroke Program Early CT Score) - Ganglionic level infarction (caudate, lentiform nuclei, internal capsule, insula, M1-M3 cortex): 7/7 - Supraganglionic infarction (M4-M6 cortex): 3/3 Total score (0-10 with 10 being normal): 10/10 IMPRESSION: 1. Stable mild atrophy and generalized white matter disease. This likely reflects the sequela of chronic microvascular ischemia. 2. No acute intracranial abnormality or significant interval change. 3. ASPECTS is 10/10. Electronically Signed   By: San Morelle M.D.   On: 04/16/2021 14:18  CT ANGIO HEAD NECK W WO CM (CODE STROKE)  Result Date: 04/16/2021 CLINICAL DATA:  Acute onset of expressive aphasia.  Patient has since returned to baseline. EXAM: CT ANGIOGRAPHY HEAD AND NECK TECHNIQUE: Multidetector CT imaging of the head and neck was performed using the standard protocol during bolus administration of intravenous contrast. Multiplanar CT image reconstructions and MIPs were obtained to evaluate the vascular anatomy. Carotid stenosis measurements (when applicable) are obtained utilizing NASCET criteria, using the distal internal carotid diameter as the denominator. CONTRAST:  138mL OMNIPAQUE IOHEXOL 350 MG/ML SOLN COMPARISON:  CT head without contrast 04/16/2021 FINDINGS: CTA NECK FINDINGS Aortic arch: A 3 vessel arch configuration is present. Atherosclerotic changes are present at the arch without aneurysm or stenosis. Right carotid system: The right common carotid artery is within normal limits. Bifurcation is unremarkable. The cervical right ICA is within normal limits. Left carotid system: The left common carotid artery is within normal limits. Atherosclerotic calcifications are present at the bifurcation. Mild tortuosity is present cervical left ICA without significant stenosis. Vertebral arteries: The left vertebral artery is dominant. Both vertebral arteries originate from the subclavian arteries without significant stenosis. No significant stenosis is present in either vertebral artery in the neck. Skeleton: Cervical fusion is noted at C6-7. Ankylosis present at C4-5. No focal osseous lesions are present. Other neck: Soft tissues the neck are otherwise unremarkable. Salivary glands are within normal limits. Thyroid is normal. No significant adenopathy is present. No focal mucosal or submucosal lesions are present. Upper chest: The lung apices are clear. Thoracic inlet is within normal limits. Review of the MIP images confirms the above findings CTA HEAD FINDINGS Anterior circulation: Minimal atherosclerotic calcifications are present within the right cavernous internal carotid artery. No significant  stenosis is present through the ICA termini. The A1 and M1 segments are normal. The right A1 segment is dominant. The anterior communicating artery is patent. MCA bifurcations are intact. The ACA and MCA branch vessels are within normal limits. Posterior circulation: The left vertebral artery is the dominant vessel. Calcifications are present at the dural margins of both vertebral arteries without significant stenosis. PICA origins are visualized and normal. The vertebrobasilar junction is normal. Basilar artery is within normal limits. Both posterior cerebral arteries originate from basilar tip. Right posterior communicating artery contributes. The PCA branch vessels are within normal limits bilaterally. Venous sinuses: The dural sinuses are patent. The straight sinus deep cerebral veins are intact. Cortical veins are within normal limits. No significant vascular malformation is evident. Anatomic variants: None Review of the MIP images confirms the above findings IMPRESSION: 1. No emergent large vessel occlusion. 2. Minimal atherosclerotic changes at the left carotid bifurcation and right cavernous internal carotid artery without significant stenosis. 3. No significant proximal stenosis, aneurysm, or branch vessel occlusion within the Circle of Willis. 4. Cervical fusion at C6-7. Ankylosis at C4-5. Electronically Signed   By: San Morelle M.D.   On: 04/16/2021 15:50     Labs:   Basic Metabolic Panel: Recent Labs  Lab 04/16/21 1410 04/16/21 1412  NA 134* 142  K 4.0 2.8*  CL 100 112*  CO2 26  --   GLUCOSE 103* 75  BUN 14 9  CREATININE 0.85 0.40*  CALCIUM 9.2  --    GFR Estimated Creatinine Clearance: 58.7 mL/min (A) (by C-G formula based on SCr of 0.4 mg/dL (L)). Liver Function Tests: Recent Labs  Lab 04/16/21 1410  AST 24  ALT 15  ALKPHOS 66  BILITOT 0.6  PROT 6.7  ALBUMIN 3.7   No results for input(s): LIPASE, AMYLASE in the last 168 hours. No results for input(s): AMMONIA  in the last 168 hours. Coagulation profile Recent Labs  Lab 04/16/21 1410  INR 0.9    CBC: Recent Labs  Lab 04/16/21 1410 04/16/21 1412  WBC 7.1  --   NEUTROABS 4.0  --   HGB 11.8* 8.8*  HCT 35.7* 26.0*  MCV 89.9  --   PLT 325  --    Cardiac Enzymes: No results for input(s): CKTOTAL, CKMB, CKMBINDEX, TROPONINI in the last 168 hours. BNP: Invalid input(s): POCBNP CBG: Recent Labs  Lab 04/16/21 1404  GLUCAP 82   D-Dimer No results for input(s): DDIMER in the last 72 hours. Hgb A1c Recent Labs    04/17/21 0645  HGBA1C 5.8*   Lipid Profile Recent Labs    04/17/21 0645  CHOL 160  HDL 71  LDLCALC 77  TRIG 62  CHOLHDL 2.3   Thyroid function studies No results for input(s): TSH, T4TOTAL, T3FREE, THYROIDAB in the last 72 hours.  Invalid input(s): FREET3 Anemia work up No results for input(s): VITAMINB12, FOLATE, FERRITIN, TIBC, IRON, RETICCTPCT in the last 72 hours. Microbiology Recent Results (from the past 240 hour(s))  Resp Panel by RT-PCR (Flu A&B, Covid) Nasopharyngeal Swab     Status: None   Collection Time: 04/16/21  3:08 PM   Specimen: Nasopharyngeal Swab; Nasopharyngeal(NP) swabs in vial transport medium  Result Value Ref Range Status   SARS Coronavirus 2 by RT PCR NEGATIVE NEGATIVE Final    Comment: (NOTE) SARS-CoV-2 target nucleic acids are NOT DETECTED.  The SARS-CoV-2 RNA is generally detectable in upper respiratory specimens during the acute phase of infection. The lowest concentration of SARS-CoV-2 viral copies this assay can detect is 138 copies/mL. A negative result does not preclude SARS-Cov-2 infection and should not be used as the sole basis for treatment or other patient management decisions. A negative result may occur with  improper specimen collection/handling, submission of specimen other than nasopharyngeal swab, presence of viral mutation(s) within the areas targeted by this assay, and inadequate number of viral copies(<138  copies/mL). A negative result must be combined with clinical observations, patient history, and epidemiological information. The expected result is Negative.  Fact Sheet for Patients:  EntrepreneurPulse.com.au  Fact Sheet for Healthcare Providers:  IncredibleEmployment.be  This test is no t yet approved or cleared by the [place] FDA and  has been authorized for detection and/or diagnosis of SARS-CoV-2 by FDA under an Emergency Use Authorization (EUA). This EUA will remain  in effect (meaning this test can be used) for the duration of the COVID-19 declaration under Section 564(b)(1) of the Act, 21 U.S.C.section 360bbb-3(b)(1), unless the authorization is terminated  or revoked sooner.       Influenza A by PCR NEGATIVE NEGATIVE Final   Influenza B by PCR NEGATIVE NEGATIVE Final    Comment: (NOTE) The Xpert Xpress SARS-CoV-2/FLU/RSV plus assay is intended as an aid in the diagnosis of influenza from Nasopharyngeal swab specimens and should not be used as a sole basis for treatment. Nasal washings and aspirates are unacceptable for Xpert Xpress SARS-CoV-2/FLU/RSV testing.  Fact Sheet for Patients: EntrepreneurPulse.com.au  Fact Sheet for Healthcare Providers: IncredibleEmployment.be  This test is not yet approved or cleared by the [place] FDA and has been authorized for detection and/or diagnosis of SARS-CoV-2 by FDA under an Emergency Use Authorization (EUA). This EUA will remain in effect (meaning this test can be used) for the  duration of the COVID-19 declaration under Section 564(b)(1) of the Act, 21 U.S.C. section 360bbb-3(b)(1), unless the authorization is terminated or revoked.  Performed at [facility], [place] [address], [place], [place] [address]      Signed: Terrilee Croak  [facility] 04/18/2021, 4:02 PM

## 2021-04-17 NOTE — Progress Notes (Signed)
°  Echocardiogram 2D Echocardiogram has been performed.  Darlina Sicilian M 04/17/2021, 2:04 PM

## 2021-04-17 NOTE — Procedures (Signed)
Patient Name: Deborah [name]  MRN: [id number]  Epilepsy Attending: Lora Havens  Referring Physician/Provider: Janine Ores, NP Date: 04/17/2021 Duration: 23.23 mins  Patient history: 75yo F with episodes of transient speech disturbance. EEG to evaluate for seizure  Level of alertness: Awake  AEDs during EEG study: None  Technical aspects: This EEG study was done with scalp electrodes positioned according to the 10-20 International system of electrode placement. Electrical activity was acquired at a sampling rate of 500Hz  and reviewed with a high frequency filter of 70Hz  and a low frequency filter of 1Hz . EEG data were recorded continuously and digitally stored.   Description: The posterior dominant rhythm consists of 8 Hz activity of moderate voltage (25-35 uV) seen predominantly in posterior head regions, symmetric and reactive to eye opening and eye closing. EEG showed intermittent 3 to 6 Hz theta-delta slowing in left and right temporal region. Hyperventilation and photic stimulation were not performed.     ABNORMALITY - Intermittent slow, left and right temporal region.  IMPRESSION: This study is suggestive of cortical dysfunction arising from left and right temporal region, nonspecific etiology. No seizures or epileptiform discharges were seen throughout the recording.  Karrie Fluellen Barbra Sarks

## 2021-04-17 NOTE — Evaluation (Signed)
Physical Therapy Evaluation Patient Details Name: Deborah [name] MRN: [id number] DOB: [birth year] Today's Date: 04/17/2021  History of Present Illness  76yo female who presented on 12/31 with TIA like symptoms including acute word finding problems. Seemed to resolve shortly before arrival to ED. CT/MRI negative for acute findings. PMH anxiety, aortic atherosclerosis, cerebral amylod angiopathy, T12 compression fracture, delusions, HLD, seizures, syncope, TIA, C6 fusion, TKA  Clinical Impression   Received in bed, pleasant and cooperative. Does still demonstrate mild word finding difficulty but tells me she is really at her baseline. Able to mobilize on an independent basis with no difficulty noted. Education provided on signs/symptoms of CVA, educated to return to ED if any of these symptoms should arise. Left positioned to comfort on ED stretcher with all needs met. Not in need of skilled PT services, signing off- thank you for the opportunity to participate in her care!        Recommendations for follow up therapy are one component of a multi-disciplinary discharge planning process, led by the attending physician.  Recommendations may be updated based on patient status, additional functional criteria and insurance authorization.  Follow Up Recommendations No PT follow up    Assistance Recommended at Discharge None  Functional Status Assessment Patient has not had a recent decline in their functional status  Equipment Recommendations  None recommended by PT    Recommendations for Other Services       Precautions / Restrictions Precautions Precautions: None Restrictions Weight Bearing Restrictions: No      Mobility  Bed Mobility Overal bed mobility: Independent                  Transfers Overall transfer level: Independent Equipment used: None                    Ambulation/Gait Ambulation/Gait assistance: Independent Gait Distance (Feet): 250 Feet Assistive  device: None Gait Pattern/deviations: WFL(Within Functional Limits);Step-through pattern          Stairs            Wheelchair Mobility    Modified Rankin (Stroke Patients Only)       Balance Overall balance assessment: Independent                                           Pertinent Vitals/Pain Pain Assessment: No/denies pain    Home Living Family/patient expects to be discharged to:: Private residence Living Arrangements: Spouse/significant other Available Help at Discharge: Family;Available 24 hours/day Type of Home: House Home Access: Stairs to enter   [employer] of Steps: 1 ste no rail but can grab door frame   Home Layout: One level Home Equipment: Grab bars - tub/shower;Rolling Walker (2 wheels) Additional Comments: loves getting into tub to take baths; still driving; manages own meds    Prior Function Prior Level of Function : Independent/Modified Independent                     Hand Dominance        Extremity/Trunk Assessment   Upper Extremity Assessment Upper Extremity Assessment: Defer to OT evaluation    Lower Extremity Assessment Lower Extremity Assessment: Overall WFL for tasks assessed    Cervical / Trunk Assessment Cervical / Trunk Assessment: Normal  Communication   Communication: Expressive difficulties  Cognition Arousal/Alertness: Awake/alert Behavior During Therapy: WFL for tasks assessed/performed  Overall Cognitive Status: Within Functional Limits for tasks assessed                                          General Comments      Exercises     Assessment/Plan    PT Assessment Patient does not need any further PT services  PT Problem List         PT Treatment Interventions      PT Goals (Current goals can be found in the Care Plan section)  Acute Rehab PT Goals Patient Stated Goal: go home PT Goal Formulation: With patient Time For Goal Achievement:  05/01/21 Potential to Achieve Goals: Good    Frequency     Barriers to discharge        Co-evaluation               AM-PAC PT "6 Clicks" Mobility  Outcome Measure Help needed turning from your back to your side while in a flat bed without using bedrails?: None Help needed moving from lying on your back to sitting on the side of a flat bed without using bedrails?: None Help needed moving to and from a bed to a chair (including a wheelchair)?: None Help needed standing up from a chair using your arms (e.g., wheelchair or bedside chair)?: None Help needed to walk in hospital room?: None Help needed climbing 3-5 steps with a railing? : None 6 Click Score: 24    End of Session   Activity Tolerance: Patient tolerated treatment well Patient left: in bed;with call bell/phone within reach Nurse Communication: Mobility status PT Visit Diagnosis: Other symptoms and signs involving the nervous system (R29.898)    Time: 0623-7628 PT Time Calculation (min) (ACUTE ONLY): 14 min   Charges:   PT Evaluation $PT Eval Low Complexity: 1 Low         Windell Norfolk, DPT, PN2   Supplemental Physical Therapist [place]    Pager [phone] Acute Rehab Office [phone]

## 2021-04-17 NOTE — Evaluation (Signed)
Speech Language Pathology Evaluation Patient Details Name: Deborah [name] MRN: [id number] DOB: 10/29/1945 Today's Date: 04/17/2021 Time: 1420-1440 SLP Time Calculation (min) (ACUTE ONLY): 20 min  Problem List:  Patient Active Problem List   Diagnosis Date Noted   TIA (transient ischemic attack) 04/16/2021   Syncope 03/25/2018   Primary osteoarthritis of left knee 03/22/2018   GERD (gastroesophageal reflux disease) 02/25/2018   Osteopenia 02/25/2018   Compression fracture of T12 vertebra ([place]) 05/30/2017   Aortic atherosclerosis ([place]) by Abd CT scan  11/2016 02/03/2017   History of TIA (transient ischemic attack) 12/15/2016   BMI 24.0-24.9, adult 03/12/2015   Skin cancer 08/06/2014   Migraine 08/06/2014   Generalized anxiety disorder 08/06/2014   Abnormal glucose 07/01/2013   Medication management 07/01/2013   Hypertension    Hyperlipidemia    Vitamin D deficiency    Past Medical History:  Past Medical History:  Diagnosis Date   Allergy    Anemia    Anxiety    Aortic atherosclerosis ([place]) 11/2016   Noted on CT abd   Arthritis    hands and knees   Bilateral cataracts    History of   Cellulitis    Right finger   Cerebral amyloid angiopathy ([facility])    Cervical spondylosis 12/2008   Noted on Cervical spine   Chronic nausea    Compression fracture of T12 vertebra ([place]) 06/07/2017   Mild   DDD (degenerative disc disease), cervical 12/2008   Noted on Cervical spine   Delusions ([place]) 03/24/2018   Depression    Encephalopathy 01/02/2017   Mild, [place] EEG   GERD (gastroesophageal reflux disease)    Helicobacter pylori infection 2018   History of appendicitis    age 59   History of colon polyps    History of confusion    History of hiatal hernia 10/16/2016   Noted on Endoscopy   Hyperlipidemia    Incontinence in female 08/06/2014   Internal hemorrhoid 08/02/2017   Noted on Colonoscopy   Low blood pressure reading    Migraines    Osteopenia    PONV (postoperative  nausea and vomiting)    Pre-diabetes    RLS (restless legs syndrome)    Seizure ([facility])    atypical partial comples vs. confusional migraines   Seizure disorder ([place]) 12/15/2016   Skin cancer    right lower eyelid   Squamous cell skin cancer    history of neck and upper back   Syncope 03/25/2018   TIA (transient ischemic attack)    questionable per patient   Vitamin D deficiency    Past Surgical History:  Past Surgical History:  Procedure Laterality Date   APPENDECTOMY  1959   CATARACT EXTRACTION, BILATERAL Bilateral 2014   CESAREAN SECTION  1978   COLONOSCOPY W/ POLYPECTOMY  08/02/2017   KNEE ARTHROSCOPY Bilateral 04/2014   [facility]   MOHS SURGERY Right 04/2013   Squamous cell cacinoma, neck/upper back   SPINE SURGERY  2010   C6-C7 fusion   TOTAL KNEE ARTHROPLASTY Left 03/22/2018   Procedure: LEFT TOTAL KNEE ARTHROPLASTY;  Surgeon: Dorna Leitz, MD;  Location: [facility];  Service: Orthopedics;  Laterality: Left;  Adductor Block   UPPER GI ENDOSCOPY  10/16/2016   HPI:  76yo female who presented on 12/31 with TIA like symptoms including acute word finding problems. Seemed to resolve shortly before arrival to ED. CT/MRI negative for acute findings. PMH anxiety, aortic atherosclerosis, cerebral amylod angiopathy, T12 compression fracture, delusions, HLD, seizures, syncope, TIA,  C6 fusion, TKA MRI of brain results: 1.No acute intracranial abnormality.  2. Severe chronic small vessel ischemic disease, mildly progressed  from 2018.  3. Innumerable chronic cerebral microhemorrhages most suggestive of  cerebral amyloid angiopathy, mildly increased from 2018.   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 Patient reported (and spouse confirmed) that she has had recent history (past 3 years or so) of intermittent word finding difficulties which she reports are "random". Patient  also reported that she has had a prior incident,  such as current hospitalization, of not being able to speak at all. This lasts for a brief period of time then resolves. During today's evaluation, patient demonstrated adequate receptive language abilities, but during structured language tasks, she had difficulty maintaining attention, would become distracted and tangential. When naming animals, she named 5 different ones, then asked if she could name "types of dogs" and proceeded to name a few breeds of dogs she had or was familiar with. When new category of 'fruit' was given, she did initially perseverate on "thinking about dogs". She did not exhibit errors in repetition or confrontational naming. She exhibited significant difficulty with convergent naming and would become overally verbose. When asked how pants, belt, shirt are related she replied "wearable furniture" and very soon after saying that she said "I was joking". 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 During unstructured conversation, she exhibited intermittent phonemic and semantic paraphasias and was not always aware. Overall affect appeared somewhat abnormal .SLP is recommmending (patient in agreement) outpatient SLP and patient and spouse informed that they do not need to seek out OP SLP if they do not feel  necessary but the option is there if desired.    SLP Assessment  SLP Recommendation/Assessment: All further Speech Lanaguage Pathology  needs can be addressed in the next venue of care SLP Visit Diagnosis: Aphasia (R47.01);Cognitive communication deficit (R41.841)    Recommendations for follow up therapy are one component of a multi-disciplinary discharge planning process, led by the attending physician.  Recommendations may be updated based on patient status, additional functional criteria and insurance authorization.    Follow Up Recommendations  Outpatient SLP    Assistance Recommended  at Discharge  None  Functional Status Assessment Patient has had a recent decline in their functional status and demonstrates the ability to make significant improvements in function in a reasonable and predictable amount of time.  Frequency and Duration           SLP Evaluation Cognition  Overall Cognitive Status: Difficult to assess Arousal/Alertness: Awake/alert Orientation Level: Oriented X4 Year: 2023 Month: January Day of Week: Correct Attention: Selective Selective Attention: Impaired Selective Attention Impairment: Verbal basic;Verbal complex Awareness: Appears intact Executive Function: Organizing;Sequencing Sequencing: Impaired Sequencing Impairment: Verbal basic;Verbal complex Organizing: Impaired Organizing Impairment: Verbal complex Safety/Judgment: Appears intact       Comprehension  Auditory Comprehension Overall Auditory Comprehension: Appears within functional limits for tasks assessed    Expression Expression Primary Mode of Expression: Verbal Verbal Expression Overall Verbal Expression: Impaired Initiation: No impairment Level of Generative/Spontaneous Verbalization: Conversation Repetition: No impairment Naming: Impairment Responsive: 76-100% accurate Confrontation: Within functional limits Convergent: 50-74% accurate Divergent: 25-49% accurate Other Naming Comments: demonstrated phonemic, word and semantic errors with awareness majority of the time. When asked how pants, belt, shirt were related she replied "wearable furniture" then quickly told SLP that  she was joking, however did then try to explain how items could fit into 'wearable furniture' category. Verbal Errors: Phonemic paraphasias;Semantic paraphasias;Aware of errors;Perseveration Pragmatics: Impairment Impairments: Abnormal affect;Topic maintenance Non-Verbal Means of Communication: Not applicable Written Expression Dominant Hand: Right   Oral / Motor  Oral Motor/Sensory  Function Overall Oral Motor/Sensory Function: Within functional limits Motor Speech Overall Motor Speech: Appears within functional limits for tasks assessed Respiration: Within functional limits Resonance: Within functional limits Articulation: Within functional limitis Intelligibility: Intelligible Motor Planning: Witnin functional limits            Sonia Baller, MA, CCC-SLP Speech Therapy

## 2021-04-26 NOTE — Progress Notes (Signed)
Future Appointments  Date Time Provider Department  04/27/2021 10:30 AM Unk Pinto, MD [facility]  06/08/2021 10:20 AM Donato Heinz, MD [facility]  06/22/2021 10:00 AM Penni Bombard, MD [facility]  06/29/2021  4:00 PM Liane Comber, NP [facility]  09/06/2021  3:00 PM Unk Pinto, MD [facility] Follow-Up     This very nice 76 y.o. MWF  was admitted to the hospital on 04/16/2021 and patient was discharged from the hospital on 04/17/2021. The patient now presents for follow up for transition from recent hospitalization.  The day after discharge  our clinical staff contacted the patient to assure stability and schedule a follow up appointment. The discharge summary, medications and diagnostic test results were reviewed before meeting with the patient. The patient was admitted for:    TIA (transient ischemic attack)   Aphasia -  Hyperlipidemia, mixed  Essential hypertension   Vitamin D deficiency  Medication management                            Patient was admitted with several episodes of aphasia and work-up included negative Head CT scan, and CT angiogram of the head & neck. Also Brain MRI did not show any acute intracranial abnormality, but showed severe chronic small vessel ischemic disease. It also showed innumerable chronic cerebral microhemorrhages more suggestive of cerebral amyloid angiopathy. Echocardiogram with EF 55 to 60%, no wall motion abnormality, no evidence of interatrial shunt. Symptoms improved & she was discharged for f/u as out-patient                             In 2018, she had episodes of confusion dx'd as atypical seizure vs confusional Migraine and  was treated with Topamax and then Keppra  which both were later d/c'd   in 2020 and she had done well until recently.        Hospitalization discharge instructions and medications are reconciled with the patient and husband.     Patient is also followed with  Hypertension, Hyperlipidemia, Pre-Diabetes and Vitamin D Deficiency.      Patient is treated for HTN & BP has been controlled at home. Todays BP is at goal -  120/78. Patient has had no complaints of any cardiac type chest pain, palpitations, dyspnea Vertell Limber /PND, dizziness, claudication or dependent edema.     Hyperlipidemia is controlled with diet & meds. Patient denies myalgias or other med SEs. Last Lipids were at goal :  Lab Results  Component Value Date   CHOL 160 04/17/2021   HDL 71 04/17/2021   LDLCALC 77 04/17/2021   TRIG 62 04/17/2021   CHOLHDL 2.3 04/17/2021      Also, the patient has history of PreDiabetes and has had no symptoms of reactive hypoglycemia, diabetic polys, paresthesias or visual blurring.  Last A1c was  slightly elevated :  Lab Results  Component Value Date   HGBA1C 5.8 (H) 04/17/2021      Further, the patient also has history of Vitamin D Deficiency and supplements vitamin D without any suspected side-effects. Last vitamin D was  at goal:  Lab Results  Component Value Date   VD25OH 89 09/06/2020   Current Outpatient Medications on File Prior to Visit  Medication Sig   alendronate 70 MG tablet Take      1 tablet      every 7 days  ALPRAZolam 0.5 MG tablet TAKE 1/2 TO 1 TAB 2 TO 3 TIMES ONLY IF NEEDED   aspirin EC 81 MG tablet Take 1 tablet daily.   busPIRone 10 MG tablet Take  1 tablet  3 x /day  as needed    VITAMIN D  5,000 u Take 5,000 Units daily.   escitalopram  10 MG tablet Take 1 tablet daily.   ezetimibe 10 MG tablet TAKE 1 TABLET DAILY FOR CHOLESTEROL    FLONASE  nasal spray Place 1 spray into both nostrils 2 times daily.   Multiple Vitamins-Minerals  Take 1 tablet by mouth daily.   rosuvastatin  20 MG tablet TAKE 1 TABLET DAILY    SUMAtriptan 20 MG/ACT nasal  USE ONE DOSE IN THE NOSE NOW, MAY REPEAT IN 2 HOURS.     Allergies  Allergen Reactions   Banana Nausea And Vomiting   Chocolate Other (See Comments)    Unknown    Effexor [Venlafaxine] Other (See Comments)    insomnia   Maxalt [Rizatriptan Benzoate] Nausea Only and Other (See Comments)    Nausea and excessive salivation   Other Other (See Comments)    NUTS (all kinds)   Tussin [Guaifenesin] Rash   Zostavax [Zoster Vaccine Live] Itching and Rash   PMHx:   Past Medical History:  Diagnosis Date   Allergy    Anemia    Anxiety    Aortic atherosclerosis ([place]) 11/2016   Noted on CT abd   Arthritis    hands and knees   Bilateral cataracts    History of   Cellulitis    Right finger   Cerebral amyloid angiopathy (CODE)    Cervical spondylosis 12/2008   Noted on Cervical spine   Chronic nausea    Compression fracture of T12 vertebra ([place]) 06/07/2017   Mild   DDD (degenerative disc disease), cervical 12/2008   Noted on Cervical spine   Delusions ([place]) 03/24/2018   Depression    Encephalopathy 01/02/2017   Mild, [place] EEG   GERD (gastroesophageal reflux disease)    Helicobacter pylori infection 2018   History of appendicitis    age 60   History of colon polyps    History of confusion    History of hiatal hernia 10/16/2016   Noted on Endoscopy   Hyperlipidemia    Incontinence in female 08/06/2014   Internal hemorrhoid 08/02/2017   Noted on Colonoscopy   Low blood pressure reading    Migraines    Osteopenia    PONV (postoperative nausea and vomiting)    Pre-diabetes    RLS (restless legs syndrome)    Seizure ([place])    atypical partial comples vs. confusional migraines   Seizure disorder ([place]) 12/15/2016   Skin cancer    right lower eyelid   Squamous cell skin cancer    history of neck and upper back   Syncope 03/25/2018   TIA (transient ischemic attack)    questionable per patient   Vitamin D deficiency    Immunization History  Administered Date(s) Administered   Influenza, High Dose Seasonal PF 01/01/2014, 03/15/2015, 12/13/2015, 01/03/2017, 02/05/2018, 01/30/2020, 02/22/2021   Influenza-Unspecified 12/26/2012   PFIZER  SARS-COV-2 Vaccination 05/15/2019, 06/12/2019, 01/18/2020   Pneumococcal  -13 08/06/2014   Pneumococcal-23 12/26/2012   Td 12/26/2012   Tdap 05/08/2017   Zoster, Live 12/08/2010   Past Surgical History:  Procedure Laterality Date   APPENDECTOMY  1959   CATARACT EXTRACTION, BILATERAL Bilateral 2014   [place]  COLONOSCOPY W/ POLYPECTOMY  08/02/2017   KNEE ARTHROSCOPY Bilateral 04/2014   [facility]   MOHS SURGERY Right 04/2013   Squamous cell cacinoma, neck/upper back   SPINE SURGERY  2010   C6-C7 fusion   TOTAL KNEE ARTHROPLASTY Left 03/22/2018   Procedure: LEFT TOTAL KNEE ARTHROPLASTY;  Surgeon: Dorna Leitz, MD;  Location: [facility];  Service: Orthopedics;  Laterality: Left;  Adductor Block   UPPER GI ENDOSCOPY  10/16/2016   FHx:    Reviewed / unchanged  SHx:    Reviewed / unchanged  Systems Review:  Constitutional: Denies fever, chills, wt changes, headaches, insomnia, fatigue, night sweats, change in appetite. Eyes: Denies redness, blurred vision, diplopia, discharge, itchy, watery eyes.  ENT: Denies discharge, congestion, post nasal drip, epistaxis, sore throat, earache, hearing loss, dental pain, tinnitus, vertigo, sinus pain, snoring.  CV: Denies chest pain, palpitations, irregular heartbeat, syncope, dyspnea, diaphoresis, orthopnea, PND, claudication or edema. Respiratory: denies cough, dyspnea, DOE, pleurisy, hoarseness, laryngitis, wheezing.  Gastrointestinal: Denies dysphagia, odynophagia, heartburn, reflux, water brash, abdominal pain or cramps, nausea, vomiting, bloating, diarrhea, constipation, hematemesis, melena, hematochezia  or hemorrhoids. Genitourinary: Denies dysuria, frequency, urgency, nocturia, hesitancy, discharge, hematuria or flank pain. Musculoskeletal: Denies arthralgias, myalgias, stiffness, jt. swelling, pain, limping or strain/sprain.  Skin: Denies pruritus, rash, hives, warts, acne, eczema or change in skin lesion(s). Neuro: No  weakness, tremor, incoordination, spasms, paresthesia or pain. Psychiatric: Denies confusion, memory loss or sensory loss. Endo: Denies change in weight, skin or hair change.  Heme/Lymph: No excessive bleeding, bruising or enlarged lymph nodes.  Physical Exam  BP 120/78    Pulse 84    Temp 97.9 F (36.6 C)    Resp 16    Ht 5' 6.5" (1.689 m)    Wt 145 lb (65.8 kg)    SpO2 99%    BMI 23.05 kg/m   Appears well nourished, well groomed  and in no distress.  Eyes: PERRLA, EOMs, conjunctiva no swelling or erythema. Sinuses: No frontal/maxillary tenderness ENT/Mouth: EAC's clear, TM's nl w/o erythema, bulging. Nares clear w/o erythema, swelling, exudates. Oropharynx clear without erythema or exudates. Oral hygiene is good. Tongue normal, non obstructing. Hearing intact.  Neck: Supple. Thyroid nl. Car 2+/2+ without bruits, nodes or JVD. Chest: Respirations nl with BS clear & equal w/o rales, rhonchi, wheezing or stridor.  Cor: Heart sounds normal w/ regular rate and rhythm without sig. murmurs, gallops, clicks or rubs. Peripheral pulses normal and equal  without edema.  Abdomen: Soft & bowel sounds normal. Non-tender w/o guarding, rebound, hernias, masses or organomegaly.  Lymphatics: Unremarkable.  Musculoskeletal: Full ROM all peripheral extremities, joint stability, 5/5 strength and normal gait.  Skin: Warm, dry without exposed rashes, lesions or ecchymosis apparent.  Neuro: Cranial nerves intact, reflexes equal bilaterally. Sensory-motor testing grossly intact. Tendon reflexes grossly intact.  Pysch: Alert & oriented x 3.  Insight and judgement nl & appropriate. No ideations.  Assessment and Plan:   1. TIA (transient ischemic attack), suspected   - CBC with Differential/Platelet - COMPLETE METABOLIC PANEL WITH GFR - Sedimentation rate - C-reactive protein  2. Aphasia  - Sedimentation rate - C-reactive protein  3. Hyperlipidemia, mixed  - Continue diet/meds, exercise,& lifestyle  modifications.  - Continue monitor periodic cholesterol/liver & renal functions    4. Essential hypertension  - Continue medication, monitor blood pressure at home.  - Continue DASH diet.  Reminder to go to the ER if any CP,  SOB, nausea, dizziness, severe HA, changes vision/speech.   -  CBC with Differential/Platelet - COMPLETE METABOLIC PANEL WITH GFR  5. Vitamin D deficiency  - Continue supplementation.  6. Medication management  - CBC with Differential/Platelet - COMPLETE METABOLIC PANEL WITH GFR - Sedimentation rate - C-reactive protein          Discussed with patient & spouse & they are both in favor of restarting Keppra and also taking low dose Diltiazem as prophylaxis for seizure & /or migraine. Discussed  regular exercise, BP monitoring, weight control to achieve/maintain BMI less than 25 and discussed meds and SE's. Recommended labs to assess and monitor clinical status with further disposition pending results of labs. Over 30 minutes of exam, counseling, chart review was performed.   Kirtland Bouchard, MD

## 2021-04-27 ENCOUNTER — Other Ambulatory Visit: Payer: Self-pay

## 2021-04-27 ENCOUNTER — Ambulatory Visit (INDEPENDENT_AMBULATORY_CARE_PROVIDER_SITE_OTHER): Payer: Medicare PPO | Admitting: Internal Medicine

## 2021-04-27 ENCOUNTER — Encounter: Payer: Self-pay | Admitting: Internal Medicine

## 2021-04-27 VITALS — BP 120/78 | HR 84 | Temp 97.9°F | Resp 16 | Ht 66.5 in | Wt 145.0 lb

## 2021-04-27 DIAGNOSIS — E782 Mixed hyperlipidemia: Secondary | ICD-10-CM

## 2021-04-27 DIAGNOSIS — R4701 Aphasia: Secondary | ICD-10-CM | POA: Diagnosis not present

## 2021-04-27 DIAGNOSIS — G459 Transient cerebral ischemic attack, unspecified: Secondary | ICD-10-CM | POA: Diagnosis not present

## 2021-04-27 DIAGNOSIS — I10 Essential (primary) hypertension: Secondary | ICD-10-CM | POA: Diagnosis not present

## 2021-04-27 DIAGNOSIS — E559 Vitamin D deficiency, unspecified: Secondary | ICD-10-CM | POA: Diagnosis not present

## 2021-04-27 DIAGNOSIS — Z79899 Other long term (current) drug therapy: Secondary | ICD-10-CM | POA: Diagnosis not present

## 2021-04-27 MED ORDER — DILTIAZEM HCL ER COATED BEADS 120 MG PO CP24: ORAL_CAPSULE | ORAL | 3 refills | Status: AC

## 2021-04-27 MED ORDER — LEVETIRACETAM 500 MG PO TABS: ORAL_TABLET | ORAL | 3 refills | Status: DC

## 2021-04-27 NOTE — Patient Instructions (Signed)
Transient Ischemic Attack A transient ischemic attack (TIA) causes stroke-like symptoms that go away quickly. Having a TIA means that a person is at higher risk for a stroke. A TIA happens when blood supply to the brain is blocked temporarily. A TIA is a medical emergency. What are the causes? This condition is caused by a temporary blockage in an artery in the head or neck. This means the brain does not get the blood supply it needs. There is no permanent brain damage with a TIA. A blockage can be caused by: Fatty buildup in an artery in the head or neck (atherosclerosis). A blood clot. An artery tear (dissection). Inflammation of an artery (vasculitis). Sometimes the cause is not known. What increases the risk? Certain factors may make you more likely to develop this condition. Some of these are things that you can change, such as: Obesity. Using products that contain nicotine or tobacco. Taking oral birth control, especially if you also use tobacco. Not being active. Heavy alcohol use. Drug use, especially cocaine and methamphetamine. Medical conditions that may increase your risk include: High blood pressure (hypertension). High cholesterol. Diabetes. Heart disease (coronary artery disease). An irregular heartbeat, also called atrial fibrillation (AFib). Sickle cell disease. Sleep problems (sleep apnea). Chronic inflammatory diseases, such as rheumatoid arthritis or lupus. Blood clotting disorders (hypercoagulable state). Other risk factors include: Being over the age of 74. Being female. Family history of stroke. Previous history of blood clots, stroke, TIA, or heart attack. Having a history of preeclampsia. Migraine headache. What are the signs or symptoms? Symptoms of a TIA are the same as those of a stroke. The symptoms develop suddenly, and then go away quickly. They may include: Weakness or numbness in your face, arm, or leg, especially on one side of your body. Trouble  walking or moving your arms or legs. Trouble speaking, understanding speech, or both (aphasia). Vision changes, such as double vision, blurred vision, or loss of vision. Dizziness. Confusion. Loss of balance or coordination. Nausea and vomiting. Severe headache. If possible, note what time your symptoms started. Tell your health care provider. How is this diagnosed? This condition may be diagnosed based on: Your symptoms and medical history. A physical exam. Imaging tests, usually a CT scan or MRI of the brain. Blood tests. You may also have other tests, including: Electrocardiogram (ECG). Echocardiogram. Carotid ultrasound. A scan of blood circulation in the brain (CT angiogram or MR angiogram). Continuous heart monitoring. How is this treated? The goal of treatment is to reduce the risk for a stroke. Stroke prevention therapies may include: Changes to diet and lifestyle, such as being physically active and stopping smoking. Medicines to thin the blood (antiplatelets or anticoagulants). Blood pressure medicines. Medicines to reduce cholesterol. Treating other health conditions, such as diabetes or AFib. If testing shows a narrowing in the arteries to your brain, your health care provider may recommend a procedure, such as: Carotid endarterectomy. This is done to remove the blockage from your artery. Carotid angioplasty and stenting. This uses a tube (stent) to open or widen an artery in the neck. The stent helps keep the artery open by supporting the artery walls. Follow these instructions at home: Medicines Take over-the-counter and prescription medicines only as told by your health care provider. If you were told to take a medicine to thin your blood, such as aspirin or an anticoagulant, take it exactly as told by your health care provider. Taking too much blood-thinning medicine can cause bleeding. Taking too little will  not protect you against a stroke and other  problems. Eating and drinking  Eat 5 or more servings of fruits and vegetables each day. Follow guidelines from your health care provider about your diet. You may need to follow a certain diet to help manage risk factors for stroke. This may include: Eating a low-fat, low-salt diet. Choosing high-fiber foods. Limiting carbohydrates and sugar. If you drink alcohol: Limit how much you have to: 0-1 drink a day for women who are not pregnant. 0-2 drinks a day for men. Know how much alcohol is in a drink. In the [place] one drink equals one 12 oz bottle of beer (369mL), one 5 oz glass of wine (160mL), or one 1 oz glass of hard liquor (40mL). General instructions Maintain a healthy weight. Try to get at least 30 minutes of exercise on most days. Get treatment if you have sleep apnea. Do not use any products that contain nicotine or tobacco. These products include cigarettes, chewing tobacco, and vaping devices, such as e-cigarettes. If you need help quitting, ask your health care provider. Do not use drugs. Keep all follow-up visits. This is important. Where to find more information American Stroke Association: www.stroke.org Get help right away if: You have chest pain or an irregular heartbeat. You have any symptoms of a stroke. "BE FAST" is an easy way to remember the main warning signs of a stroke. B - Balance. Signs are dizziness, sudden trouble walking, or loss of balance. E - Eyes. Signs are trouble seeing or a sudden change in vision. F - Face. Signs are sudden weakness or numbness of the face, or the face or eyelid drooping on one side. A - Arms. Signs are weakness or numbness in an arm. This happens suddenly and usually on one side of the body. S - Speech. Signs are sudden trouble speaking, slurred speech, or trouble understanding what people say. T - Time. Time to call emergency services. Write down what time symptoms started. You have other signs of a stroke, such as: A sudden,  severe headache with no known cause. Nausea or vomiting. Seizure. These symptoms may represent a serious problem that is an emergency. Do not wait to see if the symptoms will go away. Get medical help right away. Call your local emergency services (911 in the [place] Do not drive yourself to the hospital. Summary A transient ischemic attack (TIA) happens when an artery in the head or neck is blocked. The blockage clears before there is any permanent brain damage. A TIA is a medical emergency. Symptoms of a TIA are the same as those of a stroke. The symptoms develop suddenly, and then go away quickly. Having a TIA means that you are at higher risk for a stroke. The goal of treatment is to reduce your risk for a stroke. Treatment may include medicines to thin the blood and changes to diet and lifestyle. This information is not intended to replace advice given to you by your health care provider. Make sure you discuss any questions you have with your health care provider. Document Revised: 10/28/2019 Document Reviewed: 10/28/2019 Elsevier Patient Education  [place]. ============================== Migraine Headache A migraine headache is an intense, throbbing pain on one side or both sides of the head. Migraine headaches may also cause other symptoms, such as nausea, vomiting, and sensitivity to light and noise. A migraine headache can last from 4 hours to 3 days. Talk with your doctor about what things may bring on (trigger) your migraine  headaches. What are the causes? The exact cause of this condition is not known. However, a migraine may be caused when nerves in the brain become irritated and release chemicals that cause inflammation of blood vessels. This inflammation causes pain. This condition may be triggered or caused by: Drinking alcohol. Smoking. Taking medicines, such as: Medicine used to treat chest pain (nitroglycerin). Birth control pills. Estrogen. Certain blood pressure  medicines. Eating or drinking products that contain nitrates, glutamate, aspartame, or tyramine. Aged cheeses, chocolate, or caffeine may also be triggers. Doing physical activity. Other things that may trigger a migraine headache include: Menstruation. Pregnancy. Hunger. Stress. Lack of sleep or too much sleep. Weather changes. Fatigue. What increases the risk? The following factors may make you more likely to experience migraine headaches: Being a certain age. This condition is more common in people who are 49-37 years old. Being female. Having a family history of migraine headaches. Being Caucasian. Having a mental health condition, such as depression or anxiety. Being obese. What are the signs or symptoms? The main symptom of this condition is pulsating or throbbing pain. This pain may: Happen in any area of the head, such as on one side or both sides. Interfere with daily activities. Get worse with physical activity. Get worse with exposure to bright lights or loud noises. Other symptoms may include: Nausea. Vomiting. Dizziness. General sensitivity to bright lights, loud noises, or smells. Before you get a migraine headache, you may get warning signs (an aura). An aura may include: Seeing flashing lights or having blind spots. Seeing bright spots, halos, or zigzag lines. Having tunnel vision or blurred vision. Having numbness or a tingling feeling. Having trouble talking. Having muscle weakness. Some people have symptoms after a migraine headache (postdromal phase), such as: Feeling tired. Difficulty concentrating. How is this diagnosed? A migraine headache can be diagnosed based on: Your symptoms. A physical exam. Tests, such as: CT scan or an MRI of the head. These imaging tests can help rule out other causes of headaches. Taking fluid from the spine (lumbar puncture) and analyzing it (cerebrospinal fluid analysis, or CSF analysis). How is this treated? This  condition may be treated with medicines that: Relieve pain. Relieve nausea. Prevent migraine headaches. Treatment for this condition may also include: Acupuncture. Lifestyle changes like avoiding foods that trigger migraine headaches. Biofeedback. Cognitive behavioral therapy. Follow these instructions at home: Medicines Take over-the-counter and prescription medicines only as told by your health care provider. Ask your health care provider if the medicine prescribed to you: Requires you to avoid driving or using heavy machinery. Can cause constipation. You may need to take these actions to prevent or treat constipation: Drink enough fluid to keep your urine pale yellow. Take over-the-counter or prescription medicines. Eat foods that are high in fiber, such as beans, whole grains, and fresh fruits and vegetables. Limit foods that are high in fat and processed sugars, such as fried or sweet foods. Lifestyle Do not drink alcohol. Do not use any products that contain nicotine or tobacco, such as cigarettes, e-cigarettes, and chewing tobacco. If you need help quitting, ask your health care provider. Get at least 8 hours of sleep every night. Find ways to manage stress, such as meditation, deep breathing, or yoga. General instructions   Keep a journal to find out what may trigger your migraine headaches. For example, write down: What you eat and drink. How much sleep you get. Any change to your diet or medicines. If you have a migraine headache:  Avoid things that make your symptoms worse, such as bright lights. It may help to lie down in a dark, quiet room. Do not drive or use heavy machinery. Ask your health care provider what activities are safe for you while you are experiencing symptoms. Keep all follow-up visits as told by your health care provider. This is important. Contact a health care provider if: You develop symptoms that are different or more severe than your usual  migraine headache symptoms. You have more than 15 headache days in one month. Get help right away if: Your migraine headache becomes severe. Your migraine headache lasts longer than 72 hours. You have a fever. You have a stiff neck. You have vision loss. Your muscles feel weak or like you cannot control them. You start to lose your balance often. You have trouble walking. You faint. You have a seizure. Summary A migraine headache is an intense, throbbing pain on one side or both sides of the head. Migraines may also cause other symptoms, such as nausea, vomiting, and sensitivity to light and noise. This condition may be treated with medicines and lifestyle changes. You may also need to avoid certain things that trigger a migraine headache. Keep a journal to find out what may trigger your migraine headaches. Contact your health care provider if you have more than 15 headache days in a month or you develop symptoms that are different or more severe than your usual migraine headache symptoms. This information is not intended to replace advice given to you by your health care provider. Make sure you discuss any questions you have with your health care provider. Document Revised: 07/26/2018 Document Reviewed: 05/16/2018 Elsevier Patient Education  2022 [place]. ===================================== Epilepsy Epilepsy is a condition in which a person has repeated seizures over time. A seizure is a sudden burst of abnormal electrical and chemical activity in the brain. Seizures can cause a change in attention, behavior, or ability to remain awake and alert (altered mental status). Epilepsy increases a person's risk of falls, accidents, and injury. It can also lead to: Depression. Poor memory. Sudden unexplained death in epilepsy (SUDEP). This is rare, and its cause is not known. Most people with epilepsy lead normal lives. What are the causes? This condition may be caused by: A head  injury or injury that happens at birth. A high fever during childhood. A stroke. Bleeding into or around the brain. Certain medicines and drugs. Having too little oxygen for a long period of time. Abnormal brain development. Certain conditions. These may include: Brain infection. Brain tumor. Conditions that are passed from parent to child (are hereditary). Many times, the cause of this condition is not known. What are the signs or symptoms? Symptoms of a seizure vary greatly from person to person. They may include: Uncontrollable shaking (convulsions) with fast, jerking movements of the arms or legs. Stiffening of the body. Breathing problems. Confusion, staring, or unresponsiveness. Head nodding, eye blinking or fluttering, or rapid eye movements. Drooling, grunting, or making clicking sounds with your mouth. Loss of bladder control and bowel control. Some people have symptoms right before a seizure happens (aura) and right after a seizure happens. Symptoms of an aura include: Fear or anxiety. Nausea. Vertigo. This is a feeling like: You are moving when you are not. Your surroundings are moving when they are not. Dj vu. This is a feeling of having seen or heard something before. Odd tastes or smells. Changes in vision, such as seeing flashing lights or spots. Symptoms  that follow a seizure include: Confusion. Sleepiness. Headache. Sore muscles. How is this diagnosed? This condition is diagnosed based on: Your symptoms. Your medical history. A physical exam. A neurological exam. This includes checking your strength, reflexes, coordination, and sensations. Tests. These may include: A painless test that records your brain waves (electroencephalogram, orEEG). MRI. CT scan. A test of your spinal fluid (lumbar puncture, or spinal tap). Blood tests to check for signs of infection or abnormal blood chemistry. How is this treated? Treatment can control seizures. Some types  of epilepsy will need lifelong treatment, and some types go away in time. This condition may be treated with: Medicines to control seizures and prevent future seizures. A vagus nerve stimulator. This is a device that is implanted in the chest. The device sends electrical impulses to the vagus nerve and to the brain to prevent seizures. This treatment may be recommended if medicines do not help. Brain surgery. There are several kinds of surgeries that may be done to stop seizures from happening or to reduce how often seizures happen. Blood tests. You may need to have blood tests regularly to check that you are getting the right amount of medicine. The ketogenic diet. This diet involves foods that are low in carbohydrates and high in fat. When this condition has been diagnosed, it is important to begin treatment as soon as possible. For some people, epilepsy goes away in time. Follow these instructions at home: Medicines Take over-the-counter and prescription medicines only as told by your health care provider. Avoid any substances that may prevent your medicine from working properly, such as alcohol. Activity Get enough rest. Lack of sleep can make seizures more likely to happen. Follow instructions from your health care provider about driving, swimming, and doing any other activities that would be dangerous if you had a seizure. If you live in the [place] check with your local department of motor vehicles [facility]) to find out about local driving laws. Each state has specific rules about when you can legally start driving again. Educating others  Teach friends and family what to do if you have a seizure. They should: Help you get down to the ground to prevent a fall. Cushion your head and body. Loosen any tight clothing around your neck. Turn you on your side. If vomiting occurs, this helps keep your airway clear. Not hold you down. Holding you down will not stop the seizure. Not put anything in  your mouth. Stay with you until you recover. Know whether or not you need emergency care. General instructions Avoid anything that has ever triggered a seizure for you. Keep a seizure diary. Record what you remember about each seizure, especially anything that might have triggered the seizure. Keep all follow-up visits. This is important. Where to find more information Epilepsy Foundation: epilepsy.com International League Against Epilepsy: ilae.org Contact a health care provider if: Your seizure pattern changes. You continue to have seizures with treatment. You have symptoms of an infection or illness. Either of these might increase your risk of having a seizure. You are unable to take your medicine. Get help right away if: You have: A seizure that does not stop after 5 minutes. Several seizures in a row without a complete recovery between seizures. A seizure that makes it harder to breathe. A seizure that leaves you unable to speak or use a part of your body. You did not wake up right away after a seizure. You injure yourself during a seizure. You have confusion or  pain right after a seizure. These symptoms may represent a serious problem that is an emergency. Do not wait to see if the symptoms will go away. Get medical help right away. Call your local emergency services (911 in the [place] Do not drive yourself to the hospital. If you ever feel like you may hurt yourself or others, or have thoughts about taking your own life, get help right away. Go to your nearest emergency department or: Call your local emergency services (911 in the [place] Call a suicide crisis helpline, such as the [facility] at (331)614-8536 or 988 in the [place]. This is open 24 hours a day in the [place] Text the Crisis Text Line at (651)275-7838 (in the [place].). Summary Epilepsy is a condition in which a person has repeated seizures over time. Some types of epilepsy will need lifelong treatment, and  some types go away in time. Seizures can cause many symptoms, such as brief staring and uncontrollable shaking or fast movements of the arms or legs. Treatment can control seizures. Take over-the-counter and prescription medicines only as told by your health care provider. Follow instructions from your health care provider about driving, swimming, and doing any other activities that would be dangerous if you had a seizure. Teach friends and family what to do if you have a seizure. This information is not intended to replace advice given to you by your health care provider. Make sure you discuss any questions you have with your health care provider. Document Revised: 10/27/2020 Document Reviewed: 10/06/2019 Elsevier Patient Education  [place].

## 2021-04-28 LAB — COMPLETE METABOLIC PANEL WITH GFR
AG Ratio: 1.6 (calc) (ref 1.0–2.5)
ALT: 9 U/L (ref 6–29)
AST: 19 U/L (ref 10–35)
Albumin: 4 g/dL (ref 3.6–5.1)
Alkaline phosphatase (APISO): 68 U/L (ref 37–153)
BUN: 13 mg/dL (ref 7–25)
CO2: 26 mmol/L (ref 20–32)
Calcium: 9.8 mg/dL (ref 8.6–10.4)
Chloride: 101 mmol/L (ref 98–110)
Creat: 0.77 mg/dL (ref 0.60–1.00)
Globulin: 2.5 g/dL (calc) (ref 1.9–3.7)
Glucose, Bld: 105 mg/dL — ABNORMAL HIGH (ref 65–99)
Potassium: 4.7 mmol/L (ref 3.5–5.3)
Sodium: 136 mmol/L (ref 135–146)
Total Bilirubin: 0.7 mg/dL (ref 0.2–1.2)
Total Protein: 6.5 g/dL (ref 6.1–8.1)
eGFR: 80 mL/min/{1.73_m2} (ref >=60)

## 2021-04-28 LAB — CBC WITH DIFFERENTIAL/PLATELET
Absolute Monocytes: 717 cells/uL (ref 200–950)
Basophils Absolute: 90 cells/uL (ref 0–200)
Basophils Relative: 1.4 %
Eosinophils Absolute: 218 cells/uL (ref 15–500)
Eosinophils Relative: 3.4 %
HCT: 36.7 % (ref 35.0–45.0)
Hemoglobin: 12.2 g/dL (ref 11.7–15.5)
Lymphs Abs: 1773 cells/uL (ref 850–3900)
MCH: 29.8 pg (ref 27.0–33.0)
MCHC: 33.2 g/dL (ref 32.0–36.0)
MCV: 89.7 fL (ref 80.0–100.0)
MPV: 9.1 fL (ref 7.5–12.5)
Monocytes Relative: 11.2 %
Neutro Abs: 3603 cells/uL (ref 1500–7800)
Neutrophils Relative %: 56.3 %
Platelets: 351 10*3/uL (ref 140–400)
RBC: 4.09 10*6/uL (ref 3.80–5.10)
RDW: 12.1 % (ref 11.0–15.0)
Total Lymphocyte: 27.7 %
WBC: 6.4 10*3/uL (ref 3.8–10.8)

## 2021-04-28 LAB — SEDIMENTATION RATE: Sed Rate: 36 mm/h — ABNORMAL HIGH (ref 0–30)

## 2021-04-28 LAB — C-REACTIVE PROTEIN: CRP: 2.1 mg/L (ref <8.0)

## 2021-04-28 NOTE — Progress Notes (Signed)
============================================================ °-   Test results slightly outside the reference range are not unusual. If there is anything important, I will review this with you,  otherwise it is considered normal test values.  If you have further questions,  please do not hesitate to contact me at the office or via My Chart.  ============================================================ ============================================================  -  Labs - All OK    ============================================================ ============================================================

## 2021-05-17 NOTE — Progress Notes (Signed)
Future Appointments  Date Time Provider Department  05/18/2021 10:00 AM Unk Pinto, MD [facility]  06/08/2021 10:20 AM Donato Heinz, MD [facility]  06/22/2021 10:00 AM Penni Bombard, MD [facility]  06/29/2021      Wellness   4:00 PM Liane Comber, NP [facility] Quint  09/06/2021         CPE   3:00 PM Unk Pinto, MD [facility]    History of Present Illness:     Patient is a very nice 75 yo MWF with prior hx/o atypical seizure vs confusional migraine in 2018 hospitalization who took Topamax then [place] which was d/c'd in 2020. Patient did well until hospitalized overnight about 3 weeks ago when admitted with suspected TIA with confusion & aphasia. Given the ambiguity of her hospital negative w/u , patient & spouse were in favor of restarting Keppra & Diltiazem for seizure /migraine prophylaxis.  Since last OV  3 weeks ago, patient has done well  with no c/o migraines, HA's, or episodes of speech difficulties or neuro symptoms .   Medications    alendronate (FOSAMAX) 70 MG tablet, Take      1 tablet      every 7 days       with a full glass of water       on an empty stomach   for 60 minutes   diltiazem (CARDIZEM CD) 120 MG 24 hr capsule, Take 1 capsule at Night  for BP & Prevent Migraines   ezetimibe (ZETIA) 10 MG tablet, TAKE 1 TABLET BY MOUTH ONCE DAILY FOR CHOLESTEROL (Patient taking differently: Take 10 mg by mouth daily.)   rosuvastatin (CRESTOR) 20 MG tablet, TAKE 1 TABLET BY MOUTH ONCE DAILY FOR CHOLESTEROL (Patient taking differently: Take 20 mg by mouth daily.)   fluticasone (FLONASE) 50 MCG/ACT nasal spray, Place 1 spray into both nostrils 2 (two) times daily.   aspirin EC 81 MG tablet, Take 1 tablet (81 mg total) by mouth daily.   SUMAtriptan (IMITREX) 20 MG/ACT nasal spray, USE ONE DOSE IN THE NOSE NOW, MAY REPEAT IN 2 HOURS. MAX OF 2 DOSES IN 24 HOURS. (Patient taking differently: Place 20 mg into the nose every 2 (two) hours as needed for migraine.)    ALPRAZolam (XANAX) 0.5 MG tablet, TAKE 1/2 TO 1 TABLET BY MOUTH 2 TO 3 TIMES ONLY IF NEEDED FOR ANXIETY ATTACK AND LIMIT TO 5 DAYS A WEEK TO AVOID ADDICTION (Patient taking differently: Take 0.25-0.5 mg by mouth 3 (three) times daily as needed for anxiety.)   busPIRone (BUSPAR) 10 MG tablet, Take  1 tablet  3 x /day  as needed for Nerves                                             Cholecalciferol (VITAMIN D3) 125 MCG (5000 UT) CAPS, Take 5,000 Units by mouth daily.   escitalopram (LEXAPRO) 10 MG tablet, Take 1 tablet (10 mg total) by mouth daily.   levETIRAcetam (KEPPRA) 500 MG tablet, Take  1 tablet  2 x /day  to Prevent Seizures   Multiple Vitamins-Minerals (MULTIVITAMIN PO), Take 1 tablet by mouth daily.  Problem list She has Hypertension; Hyperlipidemia; Vitamin D deficiency; Abnormal glucose; Medication management; Skin cancer; Migraine; Generalized anxiety disorder; BMI 24.0-24.9, adult; History of TIA (transient ischemic attack); Aortic atherosclerosis ([place]) by Abd CT scan  11/2016; Compression fracture of  T12 vertebra ([place]); GERD (gastroesophageal reflux disease); Osteopenia; Primary osteoarthritis of left knee; Syncope; and TIA (transient ischemic attack) on their problem list.   Observations/Objective:  BP 107/74    Pulse 95    Temp (!) 97 F (36.1 C)    Resp 17    Ht 5' 6.5" (1.689 m)    Wt 144 lb 9.6 oz (65.6 kg)    SpO2 97%    BMI 22.99 kg/m   HEENT - WNL. Neck - supple.  Chest - Clear equal BS. Cor - Nl HS. RRR w/o sig MGR. PP 1(+). No edema. MS- FROM w/o deformities.  Gait Nl. Neuro -  Nl w/o focal abnormalities.  Assessment and Plan:  1. TIA (transient ischemic attack) vs  Migraine equivalent    2. Medication management   Follow Up Instructions:      I discussed the assessment and treatment plan with the patient. The patient was provided an opportunity to ask questions and all were answered. The patient agreed with the plan and demonstrated an understanding of the  instructions.       The patient was advised to call back or seek an in-person evaluation if the symptoms worsen or if the condition fails to improve as anticipated.    Kirtland Bouchard, MD

## 2021-05-18 ENCOUNTER — Encounter: Payer: Self-pay | Admitting: Internal Medicine

## 2021-05-18 ENCOUNTER — Ambulatory Visit: Payer: Medicare PPO | Admitting: Internal Medicine

## 2021-05-18 ENCOUNTER — Other Ambulatory Visit: Payer: Self-pay

## 2021-05-18 VITALS — BP 107/74 | HR 95 | Temp 97.0°F | Resp 17 | Ht 66.5 in | Wt 144.6 lb

## 2021-05-18 DIAGNOSIS — Z79899 Other long term (current) drug therapy: Secondary | ICD-10-CM | POA: Diagnosis not present

## 2021-05-18 DIAGNOSIS — G459 Transient cerebral ischemic attack, unspecified: Secondary | ICD-10-CM

## 2021-05-18 NOTE — Patient Instructions (Signed)

## 2021-06-06 NOTE — Progress Notes (Signed)
Cardiology Office Note:    Date:  06/08/2021   ID:  Deborah, [name] [birth year], MRN [id number]  PCP:  Deborah Pinto, MD  Cardiologist:  Deborah Heinz, MD  Electrophysiologist:  None   Referring MD: Deborah Pinto, MD   Chief Complaint  Patient presents with   Coronary Artery Disease    History of Present Illness:    Deborah [name] is a 76 y.o. female with a hx of possible TIA, migraines, squamous cell skin cancer, hyperlipidemia who presents for follow-up.  She was referred by Deborah [name] for evaluation of elevated calcium score, initially seen on 11/30/2020.  Calcium score on 09/27/2020 was 1368 (97th percentile).  She denies any chest pain, dyspnea, lower extremity edema, or palpitations.  Does report she has been having intermittent lightheadedness that occurs when she stands.  No syncopal episodes she does not exercise due to knee pain.  Most exertion is walking around the house.  She started smoking at age 76, quit intermittently, then for good around age 55.  Was smoking up to 0.5 packs/day.  Family history includes father had MI in 53s.  Lexiscan Myoview on 12/07/2020 showed normal perfusion, EF 67%.  Echocardiogram on 12/21/2020 showed normal biventricular function, grade 1 diastolic dysfunction, no significant valvular disease.  She was admitted 04/16/2021 with TIA.  Brain MRI suggested chronic cerebral microhemorrhages likely secondary to cerebral amyloid angiopathy.  Echocardiogram 04/17/2021 showed normal biventricular function, no significant valvular disease.  Since last clinic visit, reports has been doing OK.  Denies any chest pain, dyspnea, lower extremity edema, or palpitations.  Reports having intermittent episodes of lightheadedness and near syncope.  Reports happens suddenly, can last for hours.  Has not had any syncopal episodes but states she feels like she has come close.  She has not been exercising but does yard work.   Past Medical History:  Diagnosis  Date   Allergy    Anemia    Anxiety    Aortic atherosclerosis ([place]) 11/2016   Noted on CT abd   Arthritis    hands and knees   Bilateral cataracts    History of   Cellulitis    Right finger   Cerebral amyloid angiopathy ([facility])    Cervical spondylosis 12/2008   Noted on Cervical spine   Chronic nausea    Compression fracture of T12 vertebra ([facility]) 06/07/2017   Mild   DDD (degenerative disc disease), cervical 12/2008   Noted on Cervical spine   Delusions ([place]) 03/24/2018   Depression    Encephalopathy 01/02/2017   Mild, [place] EEG   GERD (gastroesophageal reflux disease)    Helicobacter pylori infection 2018   History of appendicitis    age 36   History of colon polyps    History of confusion    History of hiatal hernia 10/16/2016   Noted on Endoscopy   Hyperlipidemia    Incontinence in female 08/06/2014   Internal hemorrhoid 08/02/2017   Noted on Colonoscopy   Low blood pressure reading    Migraines    Osteopenia    PONV (postoperative nausea and vomiting)    Pre-diabetes    RLS (restless legs syndrome)    Seizure ([facility])    atypical partial comples vs. confusional migraines   Seizure disorder ([place]) 12/15/2016   Skin cancer    right lower eyelid   Squamous cell skin cancer    history of neck and upper back   Syncope 03/25/2018   TIA (transient ischemic attack)  questionable per patient   Vitamin D deficiency     Past Surgical History:  Procedure Laterality Date   APPENDECTOMY  1959   CATARACT EXTRACTION, BILATERAL Bilateral 2014   CESAREAN SECTION  1978   COLONOSCOPY W/ POLYPECTOMY  08/02/2017   KNEE ARTHROSCOPY Bilateral 04/2014   [facility]   MOHS SURGERY Right 04/2013   Squamous cell cacinoma, neck/upper back   SPINE SURGERY  2010   C6-C7 fusion   TOTAL KNEE ARTHROPLASTY Left 03/22/2018   Procedure: LEFT TOTAL KNEE ARTHROPLASTY;  Surgeon: Deborah Leitz, MD;  Location: [facility];  Service: Orthopedics;  Laterality: Left;  Adductor Block   UPPER GI  ENDOSCOPY  10/16/2016    Current Medications: Current Meds  Medication Sig   alendronate (FOSAMAX) 70 MG tablet Take      1 tablet      every 7 days       with a full glass of water       on an empty stomach       for 60 minutes   ALPRAZolam (XANAX) 0.5 MG tablet TAKE 1/2 TO 1 TABLET BY MOUTH 2 TO 3 TIMES ONLY IF NEEDED FOR ANXIETY ATTACK AND LIMIT TO 5 DAYS A WEEK TO AVOID ADDICTION (Patient taking differently: Take 0.25-0.5 mg by mouth 3 (three) times daily as needed for anxiety.)   aspirin EC 81 MG tablet Take 1 tablet (81 mg total) by mouth daily.   busPIRone (BUSPAR) 10 MG tablet Take  1 tablet  3 x /day  as needed for Nerves                                          /         TAKE 1 TABLET BY MOUTH THREE TIMES DAILY (Patient taking differently: Take 10 mg by mouth 3 (three) times daily as needed (nerves).)   Cholecalciferol (VITAMIN D3) 125 MCG (5000 UT) CAPS Take 5,000 Units by mouth daily.   diltiazem (CARDIZEM CD) 120 MG 24 hr capsule Take 1 capsule at Night  for BP & Prevent Migraines   escitalopram (LEXAPRO) 10 MG tablet Take 1 tablet (10 mg total) by mouth daily.   ezetimibe (ZETIA) 10 MG tablet TAKE 1 TABLET BY MOUTH ONCE DAILY FOR CHOLESTEROL (Patient taking differently: Take 10 mg by mouth daily.)   fluticasone (FLONASE) 50 MCG/ACT nasal spray Place 1 spray into both nostrils 2 (two) times daily.   levETIRAcetam (KEPPRA) 500 MG tablet Take  1 tablet  2 x /day  to Prevent Seizures   Multiple Vitamins-Minerals (MULTIVITAMIN PO) Take 1 tablet by mouth daily.   SUMAtriptan (IMITREX) 20 MG/ACT nasal spray USE ONE DOSE IN THE NOSE NOW, MAY REPEAT IN 2 HOURS. MAX OF 2 DOSES IN 24 HOURS. (Patient taking differently: Place 20 mg into the nose every 2 (two) hours as needed for migraine.)   [DISCONTINUED] rosuvastatin (CRESTOR) 20 MG tablet TAKE 1 TABLET BY MOUTH ONCE DAILY FOR CHOLESTEROL (Patient taking differently: Take 20 mg by mouth daily.)     Allergies:   Banana, Chocolate, Effexor  [venlafaxine], Maxalt [rizatriptan benzoate], Other, Tussin [guaifenesin], and Zostavax [zoster vaccine live]   Social History   Socioeconomic History   Marital status: Married    Spouse name: Not on file   Number of children: Not on file   Years of education: Not on file   Highest  education level: Not on file  Occupational History   Not on file  Tobacco Use   Smoking status: Former    Packs/day: 1.00    Years: 4.00    Pack years: 4.00    Types: Cigarettes    Quit date: 03/25/1986    Years since quitting: 35.2   Smokeless tobacco: Never  Vaping Use   Vaping Use: Never used  Substance and Sexual Activity   Alcohol use: Not Currently    Comment: occasional   Drug use: No   Sexual activity: Not on file  Other Topics Concern   Not on file  Social History Narrative   Not on file   Social Determinants of Health   Financial Resource Strain: Not on file  Food Insecurity: Not on file  Transportation Needs: Not on file  Physical Activity: Not on file  Stress: Not on file  Social Connections: Not on file     Family History: The patient's family history includes Diabetes in her mother; Heart disease in her father and mother; Hyperlipidemia in her sister; Hypertension in her brother and father; Stroke in her father.  ROS:   Please see the history of present illness.     All other systems reviewed and are negative.  EKGs/Labs/Other Studies Reviewed:    The following studies were reviewed today:   EKG:   06/08/21: NSR, rate 73, no ST abnormalities   Recent Labs: 09/06/2020: TSH 2.75 12/22/2020: Magnesium 2.3 04/27/2021: ALT 9; BUN 13; Creat 0.77; Hemoglobin 12.2; Platelets 351; Potassium 4.7; Sodium 136  Recent Lipid Panel    Component Value Date/Time   CHOL 160 04/17/2021 0645   TRIG 62 04/17/2021 0645   HDL 71 04/17/2021 0645   CHOLHDL 2.3 04/17/2021 0645   VLDL 12 04/17/2021 0645   LDLCALC 77 04/17/2021 0645   LDLCALC 63 12/22/2020 1016    Physical Exam:     VS:  BP 102/76    Pulse 73    Ht 5' 6.5" (1.689 m)    Wt 144 lb 6.4 oz (65.5 kg)    SpO2 95%    BMI 22.96 kg/m     Wt Readings from Last 3 Encounters:  06/08/21 144 lb 6.4 oz (65.5 kg)  05/18/21 144 lb 9.6 oz (65.6 kg)  04/27/21 145 lb (65.8 kg)     GEN:  Well nourished, well developed in no acute distress HEENT: Normal NECK: No JVD; No carotid bruits LYMPHATICS: No lymphadenopathy CARDIAC: RRR, no murmurs, rubs, gallops RESPIRATORY:  Clear to auscultation without rales, wheezing or rhonchi  ABDOMEN: Soft, non-tender, non-distended MUSCULOSKELETAL:  No edema; No deformity  SKIN: Warm and dry NEUROLOGIC:  Alert and oriented x 3 PSYCHIATRIC:  Normal affect   ASSESSMENT:    1. Coronary artery disease involving native coronary artery of native heart without angina pectoris   2. Hyperlipidemia, mixed   3. Near syncope   4. TIA (transient ischemic attack)     PLAN:    CAD: Calcium score on 09/27/2020 was 1368 (97th percentile).  Lexiscan Myoview on 12/07/2020 showed normal perfusion, EF 67%.  Echocardiogram on 12/21/2020 showed normal biventricular function, grade 1 diastolic dysfunction, no significant valvular disease.   -Continue rosuvastatin  -Continue aspirin 81 mg daily  TIA: She was admitted 04/16/2021 with TIA.  Brain MRI suggested chronic cerebral microhemorrhages likely secondary to cerebral amyloid angiopathy.  Echocardiogram 04/17/2021 showed normal biventricular function, no significant valvular disease.  Seen by neurology, recommended continuing aspirin. -Check Zio patch x2 weeks to  rule out A-fib  Near syncope: Reports episodes of sudden lightheadedness and near syncope.  Check ZIO patch x2 weeks to rule out arrhythmia  Hyperlipidemia: On rosuvastatin 20 mg daily and Zetia 10 mg daily.  Most recent LDL 77 04/17/2021.  Will increase rosuvastatin to 40 mg daily  RTC in 1 year  Medication Adjustments/Labs and Tests Ordered: Current medicines are reviewed at length with  the patient today.  Concerns regarding medicines are outlined above.  Orders Placed This Encounter  Procedures   LONG TERM MONITOR (3-14 DAYS)   EKG 12-Lead    Meds ordered this encounter  Medications   rosuvastatin (CRESTOR) 40 MG tablet    Sig: Take 1 tablet (40 mg total) by mouth daily. for cholesterol.    Dispense:  90 tablet    Refill:  3     Patient Instructions  Medication Instructions:  START: ROSUVASTATIN 40mg  ONCE DAILY  *If you need a refill on your cardiac medications before your next appointment, please call your pharmacy*  Testing/Procedures:  ZIO XT- Long Term Monitor Instructions   Your physician has requested you wear your ZIO patch monitor___14____days.   This is a single patch monitor.  [employer] supplies one patch monitor per enrollment.  Additional stickers are not available.   Please do not apply patch if you will be having a Nuclear Stress Test, Echocardiogram, Cardiac CT, MRI, or Chest Xray during the time frame you would be wearing the monitor. The patch cannot be worn during these tests.  You cannot remove and re-apply the ZIO XT patch monitor.   Your ZIO patch monitor will be sent USPS Priority mail from [facility] directly to your home address. The monitor may also be mailed to a PO BOX if home delivery is not available.   It may take 3-5 days to receive your monitor after you have been enrolled.   Once you have received you monitor, please review enclosed instructions.  Your monitor has already been registered assigning a specific monitor serial # to you.   Applying the monitor   Shave hair from upper left chest.   Hold abrader disc by orange tab.  Rub abrader in 40 strokes over left upper chest as indicated in your monitor instructions.   Clean area with 4 enclosed alcohol pads .  Use all pads to assure are is cleaned thoroughly.  Let dry.   Apply patch as indicated in monitor instructions.  Patch will be place under collarbone on left  side of chest with arrow pointing upward.   Rub patch adhesive wings for 2 minutes.Remove white label marked "1".  Remove white label marked "2".  Rub patch adhesive wings for 2 additional minutes.   While looking in a mirror, press and release button in center of patch.  A small green light will flash 3-4 times .  This will be your only indicator the monitor has been turned on.     Do not shower for the first 24 hours.  You may shower after the first 24 hours.   Press button if you feel a symptom. You will hear a small click.  Record Date, Time and Symptom in the Patient Log Book.   When you are ready to remove patch, follow instructions on last 2 pages of Patient Log Book.  Stick patch monitor onto last page of Patient Log Book.   Place Patient Log Book in [place] box.  Use locking tab on box and tape box closed securely.  The [facility]  and [place] box has [employer] on it.  Please place in mailbox as soon as possible.  Your physician should have your test results approximately 7 days after the monitor has been mailed back to [facility].   Call [place] at [phone] if you have questions regarding your ZIO XT patch monitor.  Call them immediately if you see an orange light blinking on your monitor.   If your monitor falls off in less than 4 days contact our Monitor department at [phone].  If your monitor becomes loose or falls off after 4 days call [employer] at [phone] for suggestions on securing your monitor.   Follow-Up: At [facility], you and your health needs are our priority.  As part of our continuing mission to provide you with exceptional heart care, we have created designated Provider Care Teams.  These Care Teams include your primary Cardiologist (physician) and Advanced Practice Providers (APPs -  Physician Assistants and Nurse Practitioners) who all work together to provide you with the care you need, when you need it.  Your next appointment:    6 month(s)  The format for your next appointment:   In Person  Provider:   Dr. Gardiner Rhyme   Signed, Deborah Heinz, MD  06/08/2021 10:31 AM    [place]

## 2021-06-08 ENCOUNTER — Ambulatory Visit (INDEPENDENT_AMBULATORY_CARE_PROVIDER_SITE_OTHER): Payer: Medicare PPO

## 2021-06-08 ENCOUNTER — Other Ambulatory Visit: Payer: Self-pay

## 2021-06-08 ENCOUNTER — Encounter: Payer: Self-pay | Admitting: Cardiology

## 2021-06-08 ENCOUNTER — Ambulatory Visit: Payer: Medicare PPO | Admitting: Cardiology

## 2021-06-08 VITALS — BP 102/76 | HR 73 | Ht 66.5 in | Wt 144.4 lb

## 2021-06-08 DIAGNOSIS — G459 Transient cerebral ischemic attack, unspecified: Secondary | ICD-10-CM

## 2021-06-08 DIAGNOSIS — I2510 Atherosclerotic heart disease of native coronary artery without angina pectoris: Secondary | ICD-10-CM | POA: Diagnosis not present

## 2021-06-08 DIAGNOSIS — E782 Mixed hyperlipidemia: Secondary | ICD-10-CM

## 2021-06-08 DIAGNOSIS — R55 Syncope and collapse: Secondary | ICD-10-CM

## 2021-06-08 MED ORDER — ROSUVASTATIN CALCIUM 40 MG PO TABS: 40.0000 mg | ORAL_TABLET | Freq: Every day | ORAL | 3 refills | Status: DC

## 2021-06-08 NOTE — Progress Notes (Unsigned)
Enrolled patient for a 14 day Zio XT  monitor to be mailed to patients home  °

## 2021-06-08 NOTE — Patient Instructions (Signed)
Medication Instructions:  START: ROSUVASTATIN 40mg  ONCE DAILY  *If you need a refill on your cardiac medications before your next appointment, please call your pharmacy*  Testing/Procedures:  [place] Monitor Instructions   Your physician has requested you wear your ZIO patch monitor___14____days.   This is a single patch monitor.  [employer] supplies one patch monitor per enrollment.  Additional stickers are not available.   Please do not apply patch if you will be having a Nuclear Stress Test, Echocardiogram, Cardiac CT, MRI, or Chest Xray during the time frame you would be wearing the monitor. The patch cannot be worn during these tests.  You cannot remove and re-apply the ZIO XT patch monitor.   Your ZIO patch monitor will be sent USPS Priority mail from [facility] directly to your home address. The monitor may also be mailed to a PO BOX if home delivery is not available.   It may take 3-5 days to receive your monitor after you have been enrolled.   Once you have received you monitor, please review enclosed instructions.  Your monitor has already been registered assigning a specific monitor serial # to you.   Applying the monitor   Shave hair from upper left chest.   Hold abrader disc by orange tab.  Rub abrader in 40 strokes over left upper chest as indicated in your monitor instructions.   Clean area with 4 enclosed alcohol pads .  Use all pads to assure are is cleaned thoroughly.  Let dry.   Apply patch as indicated in monitor instructions.  Patch will be place under collarbone on left side of chest with arrow pointing upward.   Rub patch adhesive wings for 2 minutes.Remove white label marked "1".  Remove white label marked "2".  Rub patch adhesive wings for 2 additional minutes.   While looking in a mirror, press and release button in center of patch.  A small green light will flash 3-4 times .  This will be your only indicator the monitor has been turned on.      Do not shower for the first 24 hours.  You may shower after the first 24 hours.   Press button if you feel a symptom. You will hear a small click.  Record Date, Time and Symptom in the Patient Log Book.   When you are ready to remove patch, follow instructions on last 2 pages of Patient Log Book.  Stick patch monitor onto last page of Patient Log Book.   Place Patient Log Book in [place] box.  Use locking tab on box and tape box closed securely.  The [employer] and [employer] has [employer] on it.  Please place in mailbox as soon as possible.  Your physician should have your test results approximately 7 days after the monitor has been mailed back to [facility].   Call [place] at [phone] if you have questions regarding your ZIO XT patch monitor.  Call them immediately if you see an orange light blinking on your monitor.   If your monitor falls off in less than 4 days contact our Monitor department at [phone].  If your monitor becomes loose or falls off after 4 days call [employer] at [phone] for suggestions on securing your monitor.   Follow-Up: At [facility], you and your health needs are our priority.  As part of our continuing mission to provide you with exceptional heart care, we have created designated Provider Care Teams.  These Care  Teams include your primary Cardiologist (physician) and Advanced Practice Providers (APPs -  Physician Assistants and Nurse Practitioners) who all work together to provide you with the care you need, when you need it.  Your next appointment:   6 month(s)  The format for your next appointment:   In Person  Provider:   Dr. Gardiner Rhyme

## 2021-06-11 DIAGNOSIS — G459 Transient cerebral ischemic attack, unspecified: Secondary | ICD-10-CM | POA: Diagnosis not present

## 2021-06-11 DIAGNOSIS — R55 Syncope and collapse: Secondary | ICD-10-CM

## 2021-06-22 ENCOUNTER — Ambulatory Visit: Payer: Medicare PPO | Admitting: Diagnostic Neuroimaging

## 2021-06-22 ENCOUNTER — Encounter: Payer: Self-pay | Admitting: Diagnostic Neuroimaging

## 2021-06-22 ENCOUNTER — Other Ambulatory Visit: Payer: Self-pay

## 2021-06-22 VITALS — BP 117/79 | HR 82 | Ht 66.5 in | Wt 145.0 lb

## 2021-06-22 DIAGNOSIS — I680 Cerebral amyloid angiopathy: Secondary | ICD-10-CM

## 2021-06-22 DIAGNOSIS — R4689 Other symptoms and signs involving appearance and behavior: Secondary | ICD-10-CM | POA: Diagnosis not present

## 2021-06-22 DIAGNOSIS — E854 Organ-limited amyloidosis: Secondary | ICD-10-CM | POA: Diagnosis not present

## 2021-06-22 NOTE — Progress Notes (Signed)
[facility]  PATIENT: Deborah [name] DOB: 1945-05-10  REFERRING CLINICIAN: Cox, Amalia Hailey, MD HISTORY FROM: patient  REASON FOR VISIT: new consult   HISTORICAL  CHIEF COMPLAINT:  Chief Complaint  Patient presents with   Possible TIA    Rm 7 New Pt, ED Referral, husband- Annie Main  "no further issues since ED visit"     HISTORY OF PRESENT ILLNESS:   UPDATE (06/22/21, VRP): 76 year old female here for hosp f/u. Since hospital admission in Jan 2023, now doing well. Rare word finding difficulty. No other issues.  Patient was diagnosed with possible cerebral amyloid angiopathy and amyloid related spells.  Since then she is doing well.  No further events.  She went to PCP who restarted her levetiracetam.  PRIOR HPI (04/17/21, Dr. Reeves Forth): Deborah [name] is a 76 y.o. female with a medical history significant for essential hypertension, hyperlipidemia, anemia, pre-diabetes, anxiety, GERD, and migraines who presented to the ED 12/31 via EMS for evaluation of word-finding difficulties. Patient was on the telephone with her son this afternoon and at 13:00, she began having difficulty getting her words out and communicating with him. He attempted to her Ms. Wentling to go alert her husband but was unsuccessful. Eventually, when her husband was alerted, EMS was activated. EMS states that on arrival to her home, the word-finding difficulties had subsided but that these events recurred and resolved two separate times en route to the hospital. Neurology met the patient at the bridge for further evaluation of Code Stroke.    Of note, patient was seen on 02/22/2021 by her PCP for evaluation of a similar presentation of 2 episodes of 10 minute word-finding difficulties within the week of evaluation without further focal neurologic signs or symptoms. During this evaluation, her provider felt that her word-finding difficulties were secondary to TIA. Patient states that she has had headaches  associated with word-finding difficulties in the past but denies a current headache. Her provider noted that in 2018, patient had episodes of confusion that were felt to be most likely related to "confusional migraines versus atypical seizures" and was treated with Topamax and then Keppra before discontinuation of both in 2020.   UPDATE (03/05/18, VRP): Since last visit, doing about the same. Still with headaches and memory loss. Apparently PCP has been adjusting depakote dosing based on levels. Also TPX was restarted at some point.  Continues with mild daily headaches.  No major migraines.  No seizures.     UPDATE (02/27/17, VRP): Since last visit, doing better. Tolerating divalproex. No alleviating or aggravating factors. No more seizures. Memory loss improved. Still with a lot of headaches (right or left side; sharp pain; with nausea/vomiting; with photo/phonophobia; 3-4 days per week; hours-days; no aura; triggers --> chocolate, bananas; headaches started at age 18 years old; + fam hx of migraine mother side of family).    PRIOR HPI (12/25/16, VRP): 76 year old female here for evaluation of confusion and word finding difficulty. Patient was admitted to the hospital on 12/15/16 for headache, confusion, word finding difficulty. Apparently she woke up, went to the grocery, and when she returned home felt confused. She apparently talked to her son over the phone who could not understand what she was saying. Patient went to the neighbor's house and they were able to call for help. MRI of the brain showed evidence of possible cerebral amyloid angiopathy. Patient was diagnosed with possible underlying neurodegenerative process with superimposed confusion related to topiramate. Complex partial seizure was also raised  as a possibility. She was discharged on Depakote instead of Topamax. Patient also reports history of headaches and migraines at least once per month.     REVIEW OF SYSTEMS: Full 14 system review of  systems performed and negative with exception of: as per HPI.  ALLERGIES: Allergies  Allergen Reactions   Banana Nausea And Vomiting   Chocolate Other (See Comments)    Unknown   Effexor [Venlafaxine] Other (See Comments)    insomnia   Maxalt [Rizatriptan Benzoate] Nausea Only and Other (See Comments)    Nausea and excessive salivation   Other Other (See Comments)    NUTS (all kinds)   Tussin [Guaifenesin] Rash   Zostavax [Zoster Vaccine Live] Itching and Rash    HOME MEDICATIONS: Outpatient Medications Prior to Visit  Medication Sig Dispense Refill   alendronate (FOSAMAX) 70 MG tablet Take      1 tablet      every 7 days       with a full glass of water       on an empty stomach       for 60 minutes 13 tablet 0   ALPRAZolam (XANAX) 0.5 MG tablet TAKE 1/2 TO 1 TABLET BY MOUTH 2 TO 3 TIMES ONLY IF NEEDED FOR ANXIETY ATTACK AND LIMIT TO 5 DAYS A WEEK TO AVOID ADDICTION (Patient taking differently: Take 0.25-0.5 mg by mouth 3 (three) times daily as needed for anxiety.) 60 tablet 0   aspirin EC 81 MG tablet Take 1 tablet (81 mg total) by mouth daily. 30 tablet 11   busPIRone (BUSPAR) 10 MG tablet Take  1 tablet  3 x /day  as needed for Nerves                                          /         TAKE 1 TABLET BY MOUTH THREE TIMES DAILY (Patient taking differently: Take 10 mg by mouth 3 (three) times daily as needed (nerves).) 270 tablet 0   Cholecalciferol (VITAMIN D3) 125 MCG (5000 UT) CAPS Take 5,000 Units by mouth daily.     diltiazem (CARDIZEM CD) 120 MG 24 hr capsule Take 1 capsule at Night  for BP & Prevent Migraines 90 capsule 3   ezetimibe (ZETIA) 10 MG tablet TAKE 1 TABLET BY MOUTH ONCE DAILY FOR CHOLESTEROL (Patient taking differently: Take 10 mg by mouth daily.) 90 tablet 0   fluticasone (FLONASE) 50 MCG/ACT nasal spray Place 1 spray into both nostrils 2 (two) times daily.     Multiple Vitamins-Minerals (MULTIVITAMIN PO) Take 1 tablet by mouth daily.     rosuvastatin (CRESTOR) 40 MG  tablet Take 1 tablet (40 mg total) by mouth daily. for cholesterol. 90 tablet 3   levETIRAcetam (KEPPRA) 500 MG tablet Take  1 tablet  2 x /day  to Prevent Seizures 180 tablet 3   SUMAtriptan (IMITREX) 20 MG/ACT nasal spray USE ONE DOSE IN THE NOSE NOW, MAY REPEAT IN 2 HOURS. MAX OF 2 DOSES IN 24 HOURS. (Patient taking differently: Place 20 mg into the nose every 2 (two) hours as needed for migraine.) 12 Inhaler 2   escitalopram (LEXAPRO) 10 MG tablet Take 1 tablet (10 mg total) by mouth daily. 90 tablet 2   No facility-administered medications prior to visit.    PAST MEDICAL HISTORY: Past Medical History:  Diagnosis Date  Allergy    Anemia    Anxiety    Aortic atherosclerosis ([place]) 11/2016   Noted on CT abd   Arthritis    hands and knees   Bilateral cataracts    History of   Cellulitis    Right finger   Cerebral amyloid angiopathy (CODE)    Cervical spondylosis 12/2008   Noted on Cervical spine   Chronic nausea    Compression fracture of T12 vertebra ([place]) 06/07/2017   Mild   DDD (degenerative disc disease), cervical 12/2008   Noted on Cervical spine   Delusions ([place]) 03/24/2018   Depression    Encephalopathy 01/02/2017   Mild, [place] EEG   GERD (gastroesophageal reflux disease)    Helicobacter pylori infection 2018   History of appendicitis    age 24   History of colon polyps    History of confusion    History of hiatal hernia 10/16/2016   Noted on Endoscopy   Hyperlipidemia    Incontinence in female 08/06/2014   Internal hemorrhoid 08/02/2017   Noted on Colonoscopy   Low blood pressure reading    Migraines    Osteopenia    PONV (postoperative nausea and vomiting)    Pre-diabetes    RLS (restless legs syndrome)    Seizure ([place])    atypical partial comples vs. confusional migraines   Seizure disorder ([place]) 12/15/2016   Skin cancer    right lower eyelid   Squamous cell skin cancer    history of neck and upper back   Syncope 03/25/2018   TIA (transient  ischemic attack)    questionable per patient   Vitamin D deficiency     PAST SURGICAL HISTORY: Past Surgical History:  Procedure Laterality Date   APPENDECTOMY  1959   CATARACT EXTRACTION, BILATERAL Bilateral 2014   CESAREAN SECTION  1978   COLONOSCOPY W/ POLYPECTOMY  08/02/2017   KNEE ARTHROSCOPY Bilateral 04/2014   [facility]   MOHS SURGERY Right 04/2013   Squamous cell cacinoma, neck/upper back   SPINE SURGERY  2010   C6-C7 fusion   TOTAL KNEE ARTHROPLASTY Left 03/22/2018   Procedure: LEFT TOTAL KNEE ARTHROPLASTY;  Surgeon: Dorna Leitz, MD;  Location: [facility];  Service: Orthopedics;  Laterality: Left;  Adductor Block   UPPER GI ENDOSCOPY  10/16/2016    FAMILY HISTORY: Family History  Problem Relation Age of Onset   Diabetes Mother    Heart disease Mother    Heart disease Father    Stroke Father    Hypertension Father    Hyperlipidemia Sister    Hypertension Brother     SOCIAL HISTORY: Social History   Socioeconomic History   Marital status: Married    Spouse name: Annie Main   Number of children: Not on file   Years of education: Not on file   Highest education level: Not on file  Occupational History   Not on file  Tobacco Use   Smoking status: Former    Packs/day: 1.00    Years: 4.00    Pack years: 4.00    Types: Cigarettes    Quit date: 03/25/1986    Years since quitting: 35.2   Smokeless tobacco: Never  Vaping Use   Vaping Use: Never used  Substance and Sexual Activity   Alcohol use: Not Currently   Drug use: No   Sexual activity: Not on file  Other Topics Concern   Not on file  Social History Narrative   Lives with husband   Social Determinants of  Health   Financial Resource Strain: Not on file  Food Insecurity: Not on file  Transportation Needs: Not on file  Physical Activity: Not on file  Stress: Not on file  Social Connections: Not on file  Intimate Partner Violence: Not on file     PHYSICAL EXAM  GENERAL  EXAM/CONSTITUTIONAL: Vitals:  Vitals:   06/22/21 0937  BP: 117/79  Pulse: 82  Weight: 145 lb (65.8 kg)  Height: 5' 6.5" (1.689 m)   Body mass index is 23.05 kg/m. Wt Readings from Last 3 Encounters:  06/22/21 145 lb (65.8 kg)  06/08/21 144 lb 6.4 oz (65.5 kg)  05/18/21 144 lb 9.6 oz (65.6 kg)   Patient is in no distress; well developed, nourished and groomed; neck is supple  CARDIOVASCULAR: Examination of carotid arteries is normal; no carotid bruits Regular rate and rhythm, no murmurs Examination of peripheral vascular system by observation and palpation is normal  EYES: Ophthalmoscopic exam of optic discs and posterior segments is normal; no papilledema or hemorrhages No results found.  MUSCULOSKELETAL: Gait, strength, tone, movements noted in Neurologic exam below  NEUROLOGIC: MENTAL STATUS:  MMSE - [place] Exam 10/02/2019 03/05/2018 02/27/2017  Orientation to time 5 5 3   Orientation to Place 5 5 5   Registration 3 3 3   Attention/ Calculation 5 3 1   Recall 3 2 1   Language- name 2 objects 2 2 2   Language- repeat 1 1 1   Language- follow 3 step command 3 2 3   Language- read & follow direction 1 1 1   Write a sentence 1 1 1   Copy design 1 0 1  Total score 30 25 22    awake, alert, oriented to person, place and time recent and remote memory intact normal attention and concentration language fluent, comprehension intact, naming intact fund of knowledge appropriate  CRANIAL NERVE:  2nd - no papilledema on fundoscopic exam 2nd, 3rd, 4th, 6th - pupils equal and reactive to light, visual fields full to confrontation, extraocular muscles intact, no nystagmus 5th - facial sensation symmetric 7th - facial strength symmetric 8th - hearing intact 9th - palate elevates symmetrically, uvula midline 11th - shoulder shrug symmetric 12th - tongue protrusion midline  MOTOR:  normal bulk and tone, full strength in the BUE, BLE  SENSORY:  normal and symmetric to  light touch, pinprick, temperature, vibration  COORDINATION:  finger-nose-finger, fine finger movements normal  REFLEXES:  deep tendon reflexes present and symmetric  GAIT/STATION:  narrow based gait; able to walk on toes, heels and tandem; romberg is negative     DIAGNOSTIC DATA (LABS, IMAGING, TESTING) - I reviewed patient records, labs, notes, testing and imaging myself where available.  Lab Results  Component Value Date   WBC 6.4 04/27/2021   HGB 12.2 04/27/2021   HCT 36.7 04/27/2021   MCV 89.7 04/27/2021   PLT 351 04/27/2021      Component Value Date/Time   NA 136 04/27/2021 1005   K 4.7 04/27/2021 1005   CL 101 04/27/2021 1005   CO2 26 04/27/2021 1005   GLUCOSE 105 (H) 04/27/2021 1005   BUN 13 04/27/2021 1005   CREATININE 0.77 04/27/2021 1005   CALCIUM 9.8 04/27/2021 1005   PROT 6.5 04/27/2021 1005   ALBUMIN 3.7 04/16/2021 1410   AST 19 04/27/2021 1005   ALT 9 04/27/2021 1005   ALKPHOS 66 04/16/2021 1410   BILITOT 0.7 04/27/2021 1005   GFRNONAA >60 04/16/2021 1410   GFRNONAA 66 09/06/2020 1403   GFRAA 76 09/06/2020  1403   Lab Results  Component Value Date   CHOL 160 04/17/2021   HDL 71 04/17/2021   LDLCALC 77 04/17/2021   TRIG 62 04/17/2021   CHOLHDL 2.3 04/17/2021   Lab Results  Component Value Date   HGBA1C 5.8 (H) 04/17/2021   Lab Results  Component Value Date   NOIBBCWU88 916 12/16/2016   Lab Results  Component Value Date   TSH 2.75 09/06/2020    JAN 2023 --> HOSPITAL WORKUP  [I reviewed images myself and agree with interpretation. -VRP]  Code Stroke -small vessel disease. Atrophy. ASPECTS 10.    CTA head & neck -Minimal atherosclerotic changes at the left carotid bifurcation and right cavernous internal carotid artery without significant stenosis. Minimal atherosclerotic changes at the left carotid bifurcation and right cavernous internal carotid artery without significant stenosis. MRI brain- Severe chronic small vessel ischemic  disease, mildly progressed from 2018. Innumerable chronic cerebral microhemorrhages most suggestive of cerebral amyloid angiopathy, mildly increased from 2018. 2D Echo- EF 55 to 60%. The left ventricle has normal function. The left ventricle has no regional wall motion abnormalities LDL 63 HgbA1c 5.8   04/17/21 EEG This study is suggestive of cortical dysfunction arising from left and right temporal region, nonspecific etiology. No seizures or epileptiform discharges were seen throughout the recording.     ASSESSMENT AND PLAN  76 y.o. year old female here with:   Dx:  1. Spell of abnormal behavior   2. Cerebral amyloid angiopathy (HCC)     PLAN:   TRANSIENT SPELLS -> chronic cerebral microhemorrhages likely secondary cerebral amyloid angiopathy source continue aspirin 81 mg daily for prior TIA spells; but some slight risk for increased bleeding in the brain given amyloid angiopathy monitor BP at home continue rosuvastatin, zetia  MIGRAINE Stop sumatriptan due to stroke risk May consider topiramate or nurtec in future   SEIZURE RISK no definite evidence of seizures; no strong indication for levetiracetam; ok to stop   Return for pending if symptoms worsen or fail to improve.  I spent 70 minutes of face-to-face and non-face-to-face time with patient.  This included previsit chart review, lab review, study review, order entry, electronic health record documentation, patient education.     Penni Bombard, MD 12/19/5036, 88:28 AM Certified in Neurology, Neurophysiology and Neuroimaging  [facility] [address] [place], [place] [address] [phone]

## 2021-06-22 NOTE — Patient Instructions (Addendum)
?  TRANSIENT SPELLS -> chronic cerebral microhemorrhages likely secondary cerebral amyloid angiopathy source ?continue aspirin 81 mg daily for prior TIA spells ?monitor BP at home ?continue rosuvastatin, zetia ? ?MIGRAINE ?Stop sumatriptan due to stroke risk ?May consider topiramate or nurtec in future ?  ?SEIZURE RISK ?no definite evidence of seizures; no strong indication for levetiracetam; ok to stop ?

## 2021-06-29 ENCOUNTER — Encounter: Payer: Self-pay | Admitting: Adult Health

## 2021-06-29 ENCOUNTER — Other Ambulatory Visit: Payer: Self-pay

## 2021-06-29 ENCOUNTER — Ambulatory Visit: Payer: Medicare PPO | Admitting: Adult Health

## 2021-06-29 VITALS — BP 112/72 | HR 82 | Temp 97.7°F | Wt 142.0 lb

## 2021-06-29 DIAGNOSIS — S22080S Wedge compression fracture of T11-T12 vertebra, sequela: Secondary | ICD-10-CM

## 2021-06-29 DIAGNOSIS — I10 Essential (primary) hypertension: Secondary | ICD-10-CM | POA: Diagnosis not present

## 2021-06-29 DIAGNOSIS — I700 Atherosclerosis of aorta: Secondary | ICD-10-CM

## 2021-06-29 DIAGNOSIS — R7309 Other abnormal glucose: Secondary | ICD-10-CM

## 2021-06-29 DIAGNOSIS — E785 Hyperlipidemia, unspecified: Secondary | ICD-10-CM

## 2021-06-29 DIAGNOSIS — K219 Gastro-esophageal reflux disease without esophagitis: Secondary | ICD-10-CM | POA: Diagnosis not present

## 2021-06-29 DIAGNOSIS — M8589 Other specified disorders of bone density and structure, multiple sites: Secondary | ICD-10-CM | POA: Diagnosis not present

## 2021-06-29 DIAGNOSIS — F411 Generalized anxiety disorder: Secondary | ICD-10-CM | POA: Diagnosis not present

## 2021-06-29 DIAGNOSIS — Z0001 Encounter for general adult medical examination with abnormal findings: Secondary | ICD-10-CM

## 2021-06-29 DIAGNOSIS — Z79899 Other long term (current) drug therapy: Secondary | ICD-10-CM

## 2021-06-29 DIAGNOSIS — G43001 Migraine without aura, not intractable, with status migrainosus: Secondary | ICD-10-CM | POA: Diagnosis not present

## 2021-06-29 DIAGNOSIS — E559 Vitamin D deficiency, unspecified: Secondary | ICD-10-CM

## 2021-06-29 DIAGNOSIS — R6889 Other general symptoms and signs: Secondary | ICD-10-CM

## 2021-06-29 DIAGNOSIS — Z6822 Body mass index (BMI) 22.0-22.9, adult: Secondary | ICD-10-CM

## 2021-06-29 DIAGNOSIS — M1710 Unilateral primary osteoarthritis, unspecified knee: Secondary | ICD-10-CM

## 2021-06-29 DIAGNOSIS — Z0000 Encounter for general adult medical examination without abnormal findings: Secondary | ICD-10-CM

## 2021-06-29 MED ORDER — DICLOFENAC SODIUM 1 % EX GEL: 2.0000 g | Freq: Four times a day (QID) | CUTANEOUS | 1 refills | Status: DC

## 2021-06-29 NOTE — Progress Notes (Signed)
?MEDICARE ANNUAL WELLNESS VISIT AND FOLLOW UP ? ?Assessment:  ? ?Deborah [name] was seen today for follow-up and medicare wellness. ? ?Diagnoses and all orders for this visit: ? ?Annual Medicare Wellness Visit ?Due annually  ?Health maintenance reviewed ?- verify pneumonia vaccine received at pharmacy fall 2022, none further recommended per current guidelines ? ?Essential hypertension ?Controlled today ?-     CBC with Differential/Platelet ?-     COMPLETE METABOLIC PANEL WITH GFR ? ?Hyperlipidemia, mixed ?Continue rosuvastatin and zetia  ?-     Lipid panel ? ?Gastroesophageal reflux disease, esophagitis presence not specified ?Doing well at this time, continue to monitor diet and triggers ?No medications ?-     Magnesium ? ?Abnormal glucose ?Discussed diet and exercise ?- Check A1C q87m defer today  ? ?Vitamin D deficiency ?Continue supplementation for goal 60-100 ?At goal at last check, defer  ? ?[place] atherosclerosis [place] ?Per CT 03/25/18 ?Control blood pressure, lipids and glucose ?Disscused lifestyle modifications, diet & exercise ?Continue to monitor ? ?Migraine without aura and without status migrainosus, not intractable ?Recently controlled; continue verapamil and avoiding triggers ? ?Generalized anxiety disorder ?Improved ?Continue current meds, doing well limiting benzo, risks reviewed ?-     escitalopram (LEXAPRO) 10 MG tablet; Take 1 tablet (10 mg total) by mouth daily. ?-     busPIRone (BUSPAR) 10 MG tablet; Take 1 tablet (10 mg total) by mouth 3 (three) times daily. ? ?Osteopenia of multiple sites ?Continue VITAMIN D 25 Hydroxy (Vit-D Deficiency) ?DEXA UTD ? ?Constipation ?Discussed OTC Miralax daily or every other day ?Increase fiber in diet ?Increase water intake ? ?History of TIA ?Control blood pressure, lipids and glucose ?Disscused lifestyle modifications, diet & exercise ?Continue to monitor ? ?Allergic Rhinitis ?Try OTC nasal saline gel ?Stop flonase if irritated ? ?BMI 22, adult ?Discussed dietary and  exercise modifications ? ?Medication Management ?Continued ? ?Word finding difficulties ?Continue statin and ASA per neuro ?Back on verapamil for migraine hx  ?? Ongoing benefit with keppra, will clarify with Dr. MMelford Aase? ?Orders Placed This Encounter  ?Procedures  ? CBC with Differential/Platelet  ? COMPLETE METABOLIC PANEL WITH GFR  ? Magnesium  ? Lipid panel  ? TSH  ? ? ? ?Over 40 minutes of face to face exam, counseling, chart review and critical decision making was performed ? ?Future Appointments  ?Date Time Provider [place] ?09/06/2021  3:00 PM MUnk Pinto MD [facility] None  ?07/04/2022  4:00 PM CLiane Comber NP [facility] None  ? ? ?Plan:  ? ?During the course of the visit the patient was educated and counseled about appropriate screening and preventive services including:  ? ?Pneumococcal vaccine  ?Prevnar 13 ?Influenza vaccine ?Td vaccine ?Screening electrocardiogram ?Bone densitometry screening ?Colorectal cancer screening ?Diabetes screening ?Glaucoma screening ?Nutrition counseling  ?Advanced directives: requested ? ? ?Subjective:  ?Deborah [name] a 76y.o. female who presents for Medicare Annual Wellness Visit and 3 month follow up. She has Hypertension; Hyperlipidemia; Vitamin D deficiency; Abnormal glucose; Medication management; History of skin cancer; Migraine; Generalized anxiety disorder; BMI 22.0-22.9, adult; History of TIA (transient ischemic attack); [place] Abd [place]  11/2016; Compression fracture of T12 vertebra (HCC); GERD (gastroesophageal reflux disease); Osteopenia; and Primary osteoarthritis of left knee on their problem list. ? ? ?She was recently admitted in 03/2021 for word finding difficulties, hx of similar in 2018; she had CT/CTA that were benign, MRI showed severe chronic small vessel ischemic disease and innumerable chronic cerebral micro hemorrhages suggestive of  cerebral amyloid angiopathy. ECHO was normal. EEG was suggestive of  cortical dysfunction from bil temporal region (non-specific etiology). Negative for seizure or epileptiform discharges. Neuro recommended to continue bASA, did not recommend DAPT.  ? ?She had hospital follow up with Dr. Melford Aase and per their discussion she has been back on diltiazem for hx of recurrent migraine. Keppra was also restarted at that time, 500 mg BID (had previously been on 2018-2020 due to similar episode of unclear etiology and seizures were questioned at that time).  ? ?She reports some residual word finding since discharge, feels is improving gradually.  ? ?She has ongoing anxiety, reports doing fairly with lexapro 10 mg, buspar 10 mg TID PRN, rare limited alprazolam 1/2 tab (0.25 mg) max 2 times a week.  ? ?BMI is Body mass index is 22.58 kg/m?., she has been working on diet exercises limited by knee arthritis, declined surgery. Using tylenol. Has some back pain at night due to hx of T12 compression fracture, manages with heat.  ?Wt Readings from Last 3 Encounters:  ?06/29/21 142 lb (64.4 kg)  ?06/22/21 145 lb (65.8 kg)  ?06/08/21 144 lb 6.4 oz (65.5 kg)  ? ? ? Her blood pressure has been controlled at home, today their BP is BP: 112/72 ?She does workout. She denies chest pain, shortness of breath, dizziness.  ? ?She is on cholesterol medication (rosuvastatin 40 mg daily, zetia 10 mg daily) and denies myalgias. Her cholesterol is at goal. The cholesterol last visit was:   ?Lab Results  ?Component Value Date  ? CHOL 160 04/17/2021  ? HDL 71 04/17/2021  ? [place] 77 04/17/2021  ? TRIG 62 04/17/2021  ? CHOLHDL 2.3 04/17/2021  ? ?Marland Kitchen She has been working on diet and exercise for abnormal glucose and denies increased appetite, paresthesia of the feet, polydipsia, polyuria, visual disturbances and vomiting. Last A1C in the office was:  ?Lab Results  ?Component Value Date  ? HGBA1C 5.8 (H) 04/17/2021  ? ?Last GFR: ?Lab Results  ?Component Value Date  ? EGFR 80 04/27/2021  ? ?Patient is on Vitamin D  supplement for defciency (18 / 2008) ?Lab Results  ?Component Value Date  ? VD25OH 89 09/06/2020  ?   ? ?Medication Review: ?Current Outpatient Medications on File Prior to Visit  ?Medication Sig Dispense Refill  ? alendronate (FOSAMAX) 70 MG tablet Take      1 tablet      every 7 days       with a full glass of water       on an empty stomach       for 60 minutes 13 tablet 0  ? ALPRAZolam (XANAX) 0.5 MG tablet TAKE 1/2 TO 1 TABLET BY MOUTH 2 TO 3 TIMES ONLY IF NEEDED FOR ANXIETY ATTACK AND LIMIT TO 5 DAYS A WEEK TO AVOID ADDICTION (Patient taking differently: Take 0.25-0.5 mg by mouth 3 (three) times daily as needed for anxiety.) 60 tablet 0  ? aspirin EC 81 MG tablet Take 1 tablet (81 mg total) by mouth daily. 30 tablet 11  ? busPIRone (BUSPAR) 10 MG tablet Take  1 tablet  3 x /day  as needed for Nerves                                          /         TAKE 1 TABLET BY  MOUTH THREE TIMES DAILY (Patient taking differently: Take 10 mg by mouth 3 (three) times daily as needed (nerves).) 270 tablet 0  ? Cholecalciferol (VITAMIN D3) 125 MCG (5000 UT) CAPS Take 5,000 Units by mouth daily.    ? diltiazem (CARDIZEM CD) 120 MG 24 hr capsule Take 1 capsule at Night  for BP & Prevent Migraines 90 capsule 3  ? escitalopram (LEXAPRO) 10 MG tablet Take 1 tablet (10 mg total) by mouth daily. 90 tablet 2  ? ezetimibe (ZETIA) 10 MG tablet TAKE 1 TABLET BY MOUTH ONCE DAILY FOR CHOLESTEROL (Patient taking differently: Take 10 mg by mouth daily.) 90 tablet 0  ? fluticasone (FLONASE) 50 MCG/ACT nasal spray Place 1 spray into both nostrils 2 (two) times daily.    ? Multiple Vitamins-Minerals (MULTIVITAMIN PO) Take 1 tablet by mouth daily.    ? rosuvastatin (CRESTOR) 40 MG tablet Take 1 tablet (40 mg total) by mouth daily. for cholesterol. 90 tablet 3  ? ?No current facility-administered medications on file prior to visit.  ? ? ?Allergies  ?Allergen Reactions  ? Banana Nausea And Vomiting  ? Chocolate Other (See Comments)  ?  Unknown  ?  Effexor [Venlafaxine] Other (See Comments)  ?  insomnia  ? Maxalt [Rizatriptan Benzoate] Nausea Only and Other (See Comments)  ?  Nausea and excessive salivation  ? Other Other (See Comments)  ?  NUTS (all k

## 2021-06-30 LAB — CBC WITH DIFFERENTIAL/PLATELET
Absolute Monocytes: 798 cells/uL (ref 200–950)
Basophils Absolute: 71 cells/uL (ref 0–200)
Basophils Relative: 0.9 %
Eosinophils Absolute: 229 cells/uL (ref 15–500)
Eosinophils Relative: 2.9 %
HCT: 37.6 % (ref 35.0–45.0)
Hemoglobin: 12.5 g/dL (ref 11.7–15.5)
Lymphs Abs: 2544 cells/uL (ref 850–3900)
MCH: 29.6 pg (ref 27.0–33.0)
MCHC: 33.2 g/dL (ref 32.0–36.0)
MCV: 89.1 fL (ref 80.0–100.0)
MPV: 9.4 fL (ref 7.5–12.5)
Monocytes Relative: 10.1 %
Neutro Abs: 4258 cells/uL (ref 1500–7800)
Neutrophils Relative %: 53.9 %
Platelets: 347 10*3/uL (ref 140–400)
RBC: 4.22 10*6/uL (ref 3.80–5.10)
RDW: 12.4 % (ref 11.0–15.0)
Total Lymphocyte: 32.2 %
WBC: 7.9 10*3/uL (ref 3.8–10.8)

## 2021-06-30 LAB — COMPLETE METABOLIC PANEL WITH GFR
AG Ratio: 1.4 (calc) (ref 1.0–2.5)
ALT: 15 U/L (ref 6–29)
AST: 22 U/L (ref 10–35)
Albumin: 4.3 g/dL (ref 3.6–5.1)
Alkaline phosphatase (APISO): 79 U/L (ref 37–153)
BUN: 16 mg/dL (ref 7–25)
CO2: 29 mmol/L (ref 20–32)
Calcium: 10.3 mg/dL (ref 8.6–10.4)
Chloride: 101 mmol/L (ref 98–110)
Creat: 0.92 mg/dL (ref 0.60–1.00)
Globulin: 3 g/dL (calc) (ref 1.9–3.7)
Glucose, Bld: 97 mg/dL (ref 65–99)
Potassium: 5.3 mmol/L (ref 3.5–5.3)
Sodium: 137 mmol/L (ref 135–146)
Total Bilirubin: 0.5 mg/dL (ref 0.2–1.2)
Total Protein: 7.3 g/dL (ref 6.1–8.1)
eGFR: 65 mL/min/{1.73_m2} (ref >=60)

## 2021-06-30 LAB — LIPID PANEL
Cholesterol: 149 mg/dL (ref <200)
HDL: 64 mg/dL (ref >=50)
LDL Cholesterol (Calc): 69 mg/dL (calc)
Non-HDL Cholesterol (Calc): 85 mg/dL (calc) (ref <130)
Total CHOL/HDL Ratio: 2.3 (calc) (ref <5.0)
Triglycerides: 80 mg/dL (ref <150)

## 2021-06-30 LAB — MAGNESIUM: Magnesium: 2.5 mg/dL (ref 1.5–2.5)

## 2021-06-30 LAB — TSH: TSH: 2.39 mIU/L (ref 0.40–4.50)

## 2021-07-04 DIAGNOSIS — R55 Syncope and collapse: Secondary | ICD-10-CM | POA: Diagnosis not present

## 2021-07-04 DIAGNOSIS — G459 Transient cerebral ischemic attack, unspecified: Secondary | ICD-10-CM | POA: Diagnosis not present

## 2021-07-20 ENCOUNTER — Other Ambulatory Visit: Payer: Self-pay | Admitting: Nurse Practitioner

## 2021-07-20 DIAGNOSIS — E782 Mixed hyperlipidemia: Secondary | ICD-10-CM

## 2021-07-25 ENCOUNTER — Telehealth: Payer: Self-pay

## 2021-07-25 NOTE — Telephone Encounter (Signed)
Refill request on Alprazolam ?

## 2021-07-26 ENCOUNTER — Other Ambulatory Visit: Payer: Self-pay | Admitting: Adult Health

## 2021-07-26 MED ORDER — ALPRAZOLAM 0.5 MG PO TABS: ORAL_TABLET | ORAL | 0 refills | Status: DC

## 2021-07-26 NOTE — Progress Notes (Signed)
Future Appointments  ?Date Time Provider [place]  ?10/04/2021  2:00 PM Unk Pinto, MD [facility] None  ?07/04/2022  4:00 PM Liane Comber, NP [facility] None  ? ? ?PDMP reviewed for alprazolam refill request.  ? ?

## 2021-08-16 ENCOUNTER — Other Ambulatory Visit: Payer: Self-pay | Admitting: Adult Health Nurse Practitioner

## 2021-08-16 ENCOUNTER — Other Ambulatory Visit: Payer: Self-pay | Admitting: Nurse Practitioner

## 2021-08-16 DIAGNOSIS — E782 Mixed hyperlipidemia: Secondary | ICD-10-CM

## 2021-08-16 DIAGNOSIS — F411 Generalized anxiety disorder: Secondary | ICD-10-CM

## 2021-08-22 DIAGNOSIS — J069 Acute upper respiratory infection, unspecified: Secondary | ICD-10-CM | POA: Diagnosis not present

## 2021-09-05 DIAGNOSIS — H748X9 Other specified disorders of middle ear and mastoid, unspecified ear: Secondary | ICD-10-CM | POA: Diagnosis not present

## 2021-09-06 ENCOUNTER — Encounter: Payer: Medicare PPO | Admitting: Internal Medicine

## 2021-09-26 DIAGNOSIS — C441222 Squamous cell carcinoma of skin of right lower eyelid, including canthus: Secondary | ICD-10-CM | POA: Diagnosis not present

## 2021-09-26 DIAGNOSIS — Z85828 Personal history of other malignant neoplasm of skin: Secondary | ICD-10-CM | POA: Diagnosis not present

## 2021-09-26 DIAGNOSIS — L821 Other seborrheic keratosis: Secondary | ICD-10-CM | POA: Diagnosis not present

## 2021-09-26 DIAGNOSIS — L814 Other melanin hyperpigmentation: Secondary | ICD-10-CM | POA: Diagnosis not present

## 2021-09-26 DIAGNOSIS — D229 Melanocytic nevi, unspecified: Secondary | ICD-10-CM | POA: Diagnosis not present

## 2021-09-26 DIAGNOSIS — R229 Localized swelling, mass and lump, unspecified: Secondary | ICD-10-CM | POA: Diagnosis not present

## 2021-09-26 DIAGNOSIS — Z08 Encounter for follow-up examination after completed treatment for malignant neoplasm: Secondary | ICD-10-CM | POA: Diagnosis not present

## 2021-10-04 ENCOUNTER — Encounter: Payer: Medicare PPO | Admitting: Internal Medicine

## 2021-10-05 ENCOUNTER — Encounter: Payer: Self-pay | Admitting: Internal Medicine

## 2021-10-05 NOTE — Progress Notes (Signed)
Annual Screening/Preventative Visit & Comprehensive Evaluation &  Examination  Future Appointments  Date Time Provider Department  10/06/2021              CPE  2:00 PM Unk Pinto, MD [facility]  07/04/2022              Wellness  4:00 PM Darrol Jump, NP [facility]  10/10/2022              CPE  2:00 PM Unk Pinto, MD [facility]        This very nice 76 y.o. MWF presents for a Screening /Preventative Visit & comprehensive evaluation and management of multiple medical co-morbidities.  Patient has been followed for HTN, HLD, Prediabetes  and Vitamin D Deficiency. Abd CT scan showed Aortic Atherosclerosis  in 2018.         [[  copied 06/24/2019: In 2018, she was hospitalized with confusion, first dx'd as atypical seizure vs confusional migraine. Initially she was treated with Topamax then Keppra  ]]                                                    Patient does report occasional frustration with anomia, occasionally having difficulty  immediately remembering certain words.         HTN predates since 2010. Patient's BP has been controlled at home and patient denies any cardiac symptoms as chest pain, palpitations, shortness of breath, dizziness or ankle swelling. Today's BP is at goal -  104/70 .        Patient's hyperlipidemia is controlled with diet and Rosuvastatin. Patient denies myalgias or other medication SE's. Last lipids were at goal:  Lab Results  Component Value Date   CHOL 149 06/29/2021   HDL 64 06/29/2021   LDLCALC 69 06/29/2021   TRIG 80 06/29/2021   CHOLHDL 2.3 06/29/2021        Patient has hx/o prediabetes (A1c 6.1% /2011) and patient denies reactive hypoglycemic symptoms, visual blurring, diabetic polys or paresthesias. Last A1c was near goal:  Lab Results  Component Value Date   HGBA1C 5.8 (H) 04/17/2021         Finally, patient has history of Vitamin D Deficiency ("18" /2008) and last Vitamin D was at goal:  Lab Results  Component  Value Date   VD25OH 89 09/06/2020       Current Outpatient Medications on File Prior to Visit  Medication Sig   alendronate 70 MG tablet Tak 1 tablet every 7 days     ALPRAZolam 0.5 MG tablet Take 1/2 -1 tablet 2-3 x /day  ONLY  if needed    VITAMIN C 1000 MG tablet Take  daily.   aspirin EC 81 MG tablet Take  daily.   ASTELIN 0.1 % nasal spray Place 2 sprays into both nostrils 2  times daily   b complex vitamins capsule Take 1 capsule by mouth daily.   busPIRone 10 MG tablet Take  1 tablet  3 x /day  for Anxiety   VITAMIN D  5000 u Take daily.   escitalopram  10 MG tablet Take 1 tablet  daily.   ezetimibe10 MG tablet TAKE 1 TABLET DAILY FOR CHOLESTEROL   FLONASE  nasal spray Place 2 sprays into both nostrils daily as needed .   Multiple Vitamins-Minerals  Take 1 tablet  daily.   rosuvastatin20 MG tablet TAKE 1 TABLET  DAILY    IMITREX 20 MG/ACT nasal spray USE ONE DOSE NOW, MAY REPEAT IN 2 HOURS    topiramate (100 MG tablet Take 1 tablet 2 x /day    zinc 50 MG tablet Take  daily.     Allergies  Allergen Reactions   Banana Nausea And Vomiting   Chocolate Other (See Comments)    Unknown   Effexor [Venlafaxine] Other (See Comments)    insomnia   Maxalt [Rizatriptan Benzoate] Nausea Only and Other (See Comments)    Nausea and excessive salivation   Other Other (See Comments)    NUTS (all kinds)   Tussin [Guaifenesin] Rash   Zostavax [Zoster Vaccine Live] Itching and Rash    Past Medical History:  Diagnosis Date   Allergy    Anemia    Anxiety    Aortic atherosclerosis ([place]) 11/2016   Noted on CT abd   Arthritis    hands and knees   Bilateral cataracts    History of   Cellulitis    Right finger   Cerebral amyloid angiopathy (CODE)    Cervical spondylosis 12/2008   Noted on Cervical spine   Chronic nausea    Compression fracture of T12 vertebra ([place]) 06/07/2017   Mild   DDD (degenerative disc disease), cervical 12/2008   Noted on Cervical spine   Delusions  ([place]) 03/24/2018   Depression    Encephalopathy 01/02/2017   Mild, [place] [facility]   GERD (gastroesophageal reflux disease)    Helicobacter pylori infection 2018   History of appendicitis    age 3   History of colon polyps    History of confusion    History of hiatal hernia 10/16/2016   Noted on Endoscopy   Hyperlipidemia    Incontinence in female 08/06/2014   Internal hemorrhoid 08/02/2017   Noted on Colonoscopy   Low blood pressure reading    Migraines    Osteopenia    PONV (postoperative nausea and vomiting)    Pre-diabetes    RLS (restless legs syndrome)    Seizure ([place])    atypical partial comples vs. confusional migraines   Seizure disorder ([place]) 12/15/2016   Skin cancer    right lower eyelid   Squamous cell skin cancer    history of neck and upper back   Syncope 03/25/2018   TIA (transient ischemic attack)    questionable per patient   Vitamin D deficiency      Health Maintenance  Topic Date Due   COLONOSCOPY (Pts 45-67yr Insurance coverage will need to be confirmed)  08/03/2019   COVID-19 Vaccine (4 - Booster for Pfizer series) 04/19/2020   INFLUENZA VACCINE  11/15/2020   MAMMOGRAM  06/22/2021   TETANUS/TDAP  05/09/2027   DEXA SCAN  Completed   Hepatitis C Screening  Completed   PNA vac Low Risk Adult  Completed   HPV VACCINES  Aged Out    Last Colon - 10/28/2020- Dr MEarlean Shawl - recc 3 year f/u due July 2025   MGM - scheduled  09/22/2020   Past Surgical History:  Procedure Laterality Date   APPENDECTOMY  1959   CATARACT EXTRACTION, BILATERAL Bilateral 2014   CESAREAN SECTION  1978   COLONOSCOPY W/ POLYPECTOMY  08/02/2017   KNEE ARTHROSCOPY Bilateral 04/2014   [facility]   MOHS SURGERY Right 04/2013   Squamous cell cacinoma, neck/upper back   SPINE SURGERY  2010   C6-C7 fusion   TOTAL  KNEE ARTHROPLASTY Left 03/22/2018   Procedure: LEFT TOTAL KNEE ARTHROPLASTY;  Surgeon: Dorna Leitz, MD;  Location: [facility];  Service: Orthopedics;  Laterality:  Left;  Adductor Block   UPPER GI ENDOSCOPY  10/16/2016     Family History  Problem Relation Age of Onset   Diabetes Mother    Heart disease Mother    Heart disease Father    Stroke Father    Hypertension Father    Hyperlipidemia Sister    Hypertension Brother     Social History   Tobacco Use   Smoking status: Former Smoker    Packs/day: 1.00    Years: 4.00    Pack years: 4.00    Types: Cigarettes    Quit date: 03/25/1986    Years since quitting: 34.4   Smokeless tobacco: Never Used  Vaping Use   Vaping Use: Never used  Substance Use Topics   Alcohol use: Not Currently    Comment: occasional   Drug use: No      ROS Constitutional: Denies fever, chills, weight loss/gain, headaches, insomnia,  night sweats, and change in appetite. Does c/o fatigue. Eyes: Denies redness, blurred vision, diplopia, discharge, itchy, watery eyes.  ENT: Denies discharge, congestion, post nasal drip, epistaxis, sore throat, earache, hearing loss, dental pain, Tinnitus, Vertigo, Sinus pain, snoring.  Cardio: Denies chest pain, palpitations, irregular heartbeat, syncope, dyspnea, diaphoresis, orthopnea, PND, claudication, edema Respiratory: denies cough, dyspnea, DOE, pleurisy, hoarseness, laryngitis, wheezing.  Gastrointestinal: Denies dysphagia, heartburn, reflux, water brash, pain, cramps, nausea, vomiting, bloating, diarrhea, constipation, hematemesis, melena, hematochezia, jaundice, hemorrhoids Genitourinary: Denies dysuria, frequency, urgency, nocturia, hesitancy, discharge, hematuria, flank pain Breast: Breast lumps, nipple discharge, bleeding.  Musculoskeletal: Denies arthralgia, myalgia, stiffness, Jt. Swelling, pain, limp, and strain/sprain. Denies falls. Skin: Denies puritis, rash, hives, warts, acne, eczema, changing in skin lesion Neuro: No weakness, tremor, incoordination, spasms, paresthesia, pain Psychiatric: Denies confusion, memory loss, sensory loss. Denies  Depression. Endocrine: Denies change in weight, skin, hair change, nocturia, and paresthesia, diabetic polys, visual blurring, hyper / hypo glycemic episodes.  Heme/Lymph: No excessive bleeding, bruising, enlarged lymph nodes.  Physical Exam  BP 104/70   Pulse 70   Temp 98 F (36.7 C)   Resp 16   Ht 5' 5.5" (1.664 m)   Wt 140 lb 9.6 oz (63.8 kg)   SpO2 99%   BMI 23.04 kg/m   General Appearance: Well nourished, well groomed and in no apparent distress.  Eyes: PERRLA, EOMs, conjunctiva no swelling or erythema, normal fundi and vessels. Sinuses: No frontal/maxillary tenderness ENT/Mouth: EACs patent / TMs  nl. Nares clear without erythema, swelling, mucoid exudates. Oral hygiene is good. No erythema, swelling, or exudate. Tongue normal, non-obstructing. Tonsils not swollen or erythematous. Hearing normal.  Neck: Supple, thyroid not palpable. No bruits, nodes or JVD. Respiratory: Respiratory effort normal.  BS equal and clear bilateral without rales, rhonci, wheezing or stridor. Cardio: Heart sounds are normal with regular rate and rhythm and no murmurs, rubs or gallops. Peripheral pulses are normal and equal bilaterally without edema. No aortic or femoral bruits. Chest: symmetric with normal excursions and percussion. Breasts: Symmetric, without lumps, nipple discharge, retractions, or fibrocystic changes.  Abdomen: Flat, soft with bowel sounds active. Nontender, no guarding, rebound, hernias, masses, or organomegaly.  Lymphatics: Non tender without lymphadenopathy.  Musculoskeletal: Full ROM all peripheral extremities, joint stability, 5/5 strength, and normal gait. Skin: Warm and dry without rashes, lesions, cyanosis, clubbing or  ecchymosis.  Neuro: Cranial nerves intact, reflexes equal bilaterally. Normal muscle  tone, no cerebellar symptoms. Sensation intact.  Pysch: Alert and oriented X 3, normal affect, Insight and Judgment appropriate.    Assessment and Plan  1. Annual  Preventative Screening Examination   2. Essential hypertension  - CBC with Differential/Platelet - COMPLETE METABOLIC PANEL WITH GFR - Magnesium - TSH - EKG 12-Lead - Urinalysis, Routine w reflex microscopic - Microalbumin / creatinine urine ratio  3. Hyperlipidemia, mixed  - CBC with Differential/Platelet - Lipid panel - TSH - EKG 12-Lead  4. Abnormal glucose  - CBC with Differential/Platelet - Hemoglobin A1c - Insulin, random - EKG 12-Lead  5. Vitamin D deficiency  - VITAMIN D 25 Hydroxy    6. Aortic atherosclerosis ([place]) by Abd CT scan  11/2016  - CBC with Differential/Platelet - COMPLETE METABOLIC PANEL WITH GFR - Magnesium - Lipid panel - TSH - Hemoglobin A1c - Insulin, random - VITAMIN D 25 Hydroxy - EKG 12-Lead  7 Gastroesophageal reflux disease without esophagitis   8. Migraine    9.  History of TIA (transient ischemic attack)   10. Screening for colorectal cancer  - POC Hemoccult Bld/Stl   11. Screening for ischemic heart disease  - EKG 12-Lead  12. FHx: heart disease  - EKG 12-Lead  13. Former smoker  - EKG 12-Lead  14. Medication management  - CBC with Differential/Platelet - COMPLETE METABOLIC PANEL WITH GFR - Magnesium - Lipid panel - TSH - Hemoglobin A1c - Insulin, random - VITAMIN D 25 Hydroxy - Urinalysis, Routine w reflex microscopic - Microalbumin / creatinine urine ratio      Patient was counseled in prudent diet to achieve/maintain BMI less than 25 for weight control, BP monitoring, regular exercise and medications. Discussed med's effects and SE's.        Recommended patient to decrease her Lexapro 10 mg to 1/2 tab (= 5 mg) /daily and also cut he Buspar dose in 1/2 tab. In add'n recommended patient read "out loud" for at least 30 minutes /day.                                        Screening labs and tests as requested with regular follow-up as recommended. Over 40 minutes of exam, counseling, chart review  and high complex critical decision making was performed.   Kirtland Bouchard, MD

## 2021-10-05 NOTE — Patient Instructions (Signed)
Due to recent changes in healthcare laws, you Dudash see the results of your imaging and laboratory studies on MyChart before your provider has had a chance to review them.  We understand that in some cases there Gin be results that are confusing or concerning to you. Not all laboratory results come back in the same time frame and the provider Hoback be waiting for multiple results in order to interpret others.  Please give [place] 48 hours in order for your provider to thoroughly review all the results before contacting the office for clarification of your results.   +++++++++++++++++++++++++  Vit D  & Vit C 1,000 mg   are recommended to help protect  against the Covid-19 and other Corona viruses.    Also it's recommended  to take  Zinc 50 mg  to help  protect against the Covid-19   and best place to get  is also on [email or web address]  and don't pay more than 6-8 cents /pill !  ================================ Coronavirus (COVID-19) Are you at risk?  Are you at risk for the Coronavirus (COVID-19)?  To be considered HIGH RISK for Coronavirus (COVID-19), you have to meet the following criteria:  Traveled to [place], [place], [place], [place] or [place]; or in the [place] to [place], [place], [place]  or [place]; and have fever, cough, and shortness of breath within the last 2 weeks of travel OR Been in close contact with a person diagnosed with COVID-19 within the last 2 weeks and have  fever, cough,and shortness of breath  IF YOU DO NOT MEET THESE CRITERIA, YOU ARE CONSIDERED LOW RISK FOR COVID-19.  What to do if you are HIGH RISK for COVID-19?  If you are having a medical emergency, call 911. Seek medical care right away. Before you go to a doctor's office, urgent care or emergency department,  call ahead and tell them about your recent travel, contact with someone diagnosed with COVID-19   and your symptoms.  You should receive instructions from your physician's office regarding next  steps of care.  When you arrive at healthcare provider, tell the healthcare staff immediately you have returned from  visiting [place], [place], [place], [place] or [place]; or traveled in the [place] to [place], [place],  [place] or [place] in the last two weeks or you have been in close contact with a person diagnosed with  COVID-19 in the last 2 weeks.   Tell the health care staff about your symptoms: fever, cough and shortness of breath. After you have been seen by a medical provider, you will be either: Tested for (COVID-19) and discharged home on quarantine except to seek medical care if  symptoms worsen, and asked to  Stay home and avoid contact with others until you get your results (4-5 days)  Avoid travel on public transportation if possible (such as bus, train, or airplane) or Sent to the Emergency Department by EMS for evaluation, COVID-19 testing  and  possible admission depending on your condition and test results.  What to do if you are LOW RISK for COVID-19?  Reduce your risk of any infection by using the same precautions used for avoiding the common cold or flu:  Wash your hands often with soap and warm water for at least 20 seconds.  If soap and water are not readily available,  use an alcohol-based hand sanitizer with at least 60% alcohol.  If coughing or sneezing, cover your mouth and nose by coughing  or sneezing into the elbow areas of your shirt or coat,  into a tissue or into your sleeve (not your hands). Avoid shaking hands with others and consider head nods or verbal greetings only. Avoid touching your eyes, nose, or mouth with unwashed hands.  Avoid close contact with people who are sick. Avoid places or events with large numbers of people in one location, like concerts or sporting events. Carefully consider travel plans you have or are making. If you are planning any travel outside or inside the [place], visit the CDC's Travelers' Health webpage for the  latest health notices. If you have some symptoms but not all symptoms, continue to monitor at home and seek medical attention  if your symptoms worsen. If you are having a medical emergency, call 911.   >>>>>>>>>>>>>>>>>>>>>>>>>>>>>>>>>  We Do NOT Approve of [employer] > > > > > > > > > > > > > > > > > > > > > > > > > > > > > > > > > > > > > > >  Preventive Care for Adults  A healthy lifestyle and preventive care can promote health and wellness. Preventive health guidelines for women include the following key practices. A routine yearly physical is a good way to check with your health care provider about your health and preventive screening. It is a chance to share any concerns and updates on your health and to receive a thorough exam. Visit your dentist for a routine exam and preventive care every 6 months. Brush your teeth twice a day and floss once a day. Good oral hygiene prevents tooth decay and gum disease. The frequency of eye exams is based on your age, health, family medical history, use of contact lenses, and other factors. Follow your health care provider's recommendations for frequency of eye exams. Eat a healthy diet. Foods like vegetables, fruits, whole grains, low-fat dairy products, and lean protein foods contain the nutrients you need without too many calories. Decrease your intake of foods high in solid fats, added sugars, and salt. Eat the right amount of calories for you. Get information about a proper diet from your health care provider, if necessary. Regular physical exercise is one of the most important things you can do for your health. Most adults should get at least 150 minutes of moderate-intensity exercise (any activity that increases your heart rate and causes you to sweat) each week. In addition, most adults need muscle-strengthening exercises on 2 or more days a week. Maintain a healthy weight. The body mass index (BMI) is a screening tool to identify  possible weight problems. It provides an estimate of body fat based on height and weight. Your health care provider can find your BMI and can help you achieve or maintain a healthy weight. For adults 20 years and older: A BMI below 18.5 is considered underweight. A BMI of 18.5 to 24.9 is normal. A BMI of 25 to 29.9 is considered overweight. A BMI of 30 and above is considered obese. Maintain normal blood lipids and cholesterol levels by exercising and minimizing your intake of saturated fat. Eat a balanced diet with plenty of fruit and vegetables. If your lipid or cholesterol levels are high, you are over 50, or you are at high risk for heart disease, you may need your cholesterol levels checked more frequently. Ongoing high lipid and cholesterol levels should be treated with medicines if diet and exercise are not working. If you smoke, find out from  your health care provider how to quit. If you do not use tobacco, do not start. Lung cancer screening is recommended for adults aged 55-80 years who are at high risk for developing lung cancer because of a history of smoking. A yearly low-dose CT scan of the lungs is recommended for people who have at least a 30-pack-year history of smoking and are a current smoker or have quit within the past 15 years. A pack year of smoking is smoking an average of 1 pack of cigarettes a day for 1 year (for example: 1 pack a day for 30 years or 2 packs a day for 15 years). Yearly screening should continue until the smoker has stopped smoking for at least 15 years. Yearly screening should be stopped for people who develop a health problem that would prevent them from having lung cancer treatment. Avoid use of street drugs. Do not share needles with anyone. Ask for help if you need support or instructions about stopping the use of drugs. High blood pressure causes heart disease and increases the risk of stroke.  Ongoing high blood pressure should be treated with medicines if  weight loss and exercise do not work. If you are 55-79 years old, ask your health care provider if you should take aspirin to prevent strokes. Diabetes screening involves taking a blood sample to check your fasting blood sugar level. This should be done once every 3 years, after age 45, if you are within normal weight and without risk factors for diabetes. Testing should be considered at a younger age or be carried out more frequently if you are overweight and have at least 1 risk factor for diabetes. Breast cancer screening is essential preventive care for women. You should practice "breast self-awareness." This means understanding the normal appearance and feel of your breasts and may include breast self-examination. Any changes detected, no matter how small, should be reported to a health care provider. Women in their 20s and 30s should have a clinical breast exam (CBE) by a health care provider as part of a regular health exam every 1 to 3 years. After age 40, women should have a CBE every year. Starting at age 40, women should consider having a mammogram (breast X-ray test) every year. Women who have a family history of breast cancer should talk to their health care provider about genetic screening. Women at a high risk of breast cancer should talk to their health care providers about having an MRI and a mammogram every year. Breast cancer gene (BRCA)-related cancer risk assessment is recommended for women who have family members with BRCA-related cancers. BRCA-related cancers include breast, ovarian, tubal, and peritoneal cancers. Having family members with these cancers may be associated with an increased risk for harmful changes (mutations) in the breast cancer genes BRCA1 and BRCA2. Results of the assessment will determine the need for genetic counseling and BRCA1 and BRCA2 testing. Routine pelvic exams to screen for cancer are no longer recommended for nonpregnant women who are considered low risk for  cancer of the pelvic organs (ovaries, uterus, and vagina) and who do not have symptoms. Ask your health care provider if a screening pelvic exam is right for you. If you have had past treatment for cervical cancer or a condition that could lead to cancer, you need Pap tests and screening for cancer for at least 20 years after your treatment. If Pap tests have been discontinued, your risk factors (such as having a new sexual partner) need to be   reassessed to determine if screening should be resumed. Some women have medical problems that increase the chance of getting cervical cancer. In these cases, your health care provider may recommend more frequent screening and Pap tests.  Colorectal cancer can be detected and often prevented. Most routine colorectal cancer screening begins at the age of 37 years and continues through age 30 years. However, your health care provider may recommend screening at an earlier age if you have risk factors for colon cancer. On a yearly basis, your health care provider may provide home test kits to check for hidden blood in the stool. Use of a small camera at the end of a tube, to directly examine the colon (sigmoidoscopy or colonoscopy), can detect the earliest forms of colorectal cancer. Talk to your health care provider about this at age 5, when routine screening begins.  Direct exam of the colon should be repeated every 5-10 years through age 39 years, unless early forms of pre-cancerous polyps or small growths are found. Osteoporosis is a disease in which the bones lose minerals and strength with aging. This can result in serious bone fractures or breaks. The risk of osteoporosis can be identified using a bone density scan. Women ages 41 years and over and women at risk for fractures or osteoporosis should discuss screening with their health care providers. Ask your health care provider whether you should take a calcium supplement or vitamin D to reduce the rate of  osteoporosis. Menopause can be associated with physical symptoms and risks. Hormone replacement therapy is available to decrease symptoms and risks. You should talk to your health care provider about whether hormone replacement therapy is right for you. Use sunscreen. Apply sunscreen liberally and repeatedly throughout the day. You should seek shade when your shadow is shorter than you. Protect yourself by wearing long sleeves, pants, a wide-brimmed hat, and sunglasses year round, whenever you are outdoors. Once a month, do a whole body skin exam, using a mirror to look at the skin on your back. Tell your health care provider of new moles, moles that have irregular borders, moles that are larger than a pencil eraser, or moles that have changed in shape or color. Stay current with required vaccines (immunizations). Influenza vaccine. All adults should be immunized every year. Tetanus, diphtheria, and acellular pertussis (Td, Tdap) vaccine. Pregnant women should receive 1 dose of Tdap vaccine during each pregnancy. The dose should be obtained regardless of the length of time since the last dose. Immunization is preferred during the 27th-36th week of gestation. An adult who has not previously received Tdap or who does not know her vaccine status should receive 1 dose of Tdap. This initial dose should be followed by tetanus and diphtheria toxoids (Td) booster doses every 10 years. Adults with an unknown or incomplete history of completing a 3-dose immunization series with Td-containing vaccines should begin or complete a primary immunization series including a Tdap dose. Adults should receive a Td booster every 10 years.  Zoster vaccine. One dose is recommended for adults aged 51 years or older unless certain conditions are present.  Pneumococcal 13-valent conjugate (PCV13) vaccine. When indicated, a person who is uncertain of her immunization history and has no record of immunization should receive the PCV13  vaccine. An adult aged 65 years or older who has certain medical conditions and has not been previously immunized should receive 1 dose of PCV13 vaccine. This PCV13 should be followed with a dose of pneumococcal polysaccharide (PPSV23) vaccine. The PPSV23  vaccine dose should be obtained at least 1 or more year(s) after the dose of PCV13 vaccine. An adult aged 71 years or older who has certain medical conditions and previously received 1 or more doses of PPSV23 vaccine should receive 1 dose of PCV13. The PCV13 vaccine dose should be obtained 1 or more years after the last PPSV23 vaccine dose.  Pneumococcal polysaccharide (PPSV23) vaccine. When PCV13 is also indicated, PCV13 should be obtained first. All adults aged 83 years and older should be immunized. An adult younger than age 61 years who has certain medical conditions should be immunized. Any person who resides in a nursing home or long-term care facility should be immunized. An adult smoker should be immunized. People with an immunocompromised condition and certain other conditions should receive both PCV13 and PPSV23 vaccines. People with human immunodeficiency virus (HIV) infection should be immunized as soon as possible after diagnosis. Immunization during chemotherapy or radiation therapy should be avoided. Routine use of PPSV23 vaccine is not recommended for American Indians, [place]atives, or people younger than 65 years unless there are medical conditions that require PPSV23 vaccine. When indicated, people who have unknown immunization and have no record of immunization should receive PPSV23 vaccine. One-time revaccination 5 years after the first dose of PPSV23 is recommended for people aged 19-64 years who have chronic kidney failure, nephrotic syndrome, asplenia, or immunocompromised conditions. People who received 1-2 doses of PPSV23 before age 76 years should receive another dose of PPSV23 vaccine at age 46 years or later if at least 5 years have  passed since the previous dose. Doses of PPSV23 are not needed for people immunized with PPSV23 at or after age 38 years.  Preventive Services / Frequency  Ages 64 years and over Blood pressure check. Lipid and cholesterol check. Lung cancer screening. / Every year if you are aged 70-80 years and have a 30-pack-year history of smoking and currently smoke or have quit within the past 15 years. Yearly screening is stopped once you have quit smoking for at least 15 years or develop a health problem that would prevent you from having lung cancer treatment. Clinical breast exam.** / Every year after age 29 years.  BRCA-related cancer risk assessment.** / For women who have family members with a BRCA-related cancer (breast, ovarian, tubal, or peritoneal cancers). Mammogram.** / Every year beginning at age 82 years and continuing for as long as you are in good health. Consult with your health care provider. Pap test.** / Every 3 years starting at age 23 years through age 79 or 75 years with 3 consecutive normal Pap tests. Testing can be stopped between 65 and 70 years with 3 consecutive normal Pap tests and no abnormal Pap or HPV tests in the past 10 years. Fecal occult blood test (FOBT) of stool. / Every year beginning at age 54 years and continuing until age 56 years. You may not need to do this test if you get a colonoscopy every 10 years. Flexible sigmoidoscopy or colonoscopy.** / Every 5 years for a flexible sigmoidoscopy or every 10 years for a colonoscopy beginning at age 59 years and continuing until age 60 years. Hepatitis C blood test.** / For all people born from [birth year] through 1965 and any individual with known risks for hepatitis C. Osteoporosis screening.** / A one-time screening for women ages 34 years and over and women at risk for fractures or osteoporosis. Skin self-exam. / Monthly. Influenza vaccine. / Every year. Tetanus, diphtheria, and acellular pertussis (Tdap/Td) vaccine.** /  1 dose  of Td every 10 years. Zoster vaccine.** / 1 dose for adults aged 60 years or older. Pneumococcal 13-valent conjugate (PCV13) vaccine.** / Consult your health care provider. Pneumococcal polysaccharide (PPSV23) vaccine.** / 1 dose for all adults aged 65 years and older. Screening for abdominal aortic aneurysm (AAA)  by ultrasound is recommended for people who have history of high blood pressure or who are current or former smokers. ++++++++++++++++++++ Recommend Adult Low Dose Aspirin or  coated  Aspirin 81 mg daily  To reduce risk of Colon Cancer 40 %,  Skin Cancer 26 % ,  Melanoma 46%  and  Pancreatic cancer 60% ++++++++++++++++++++ Vitamin D goal  is between 70-100.  Please make sure that you are taking your Vitamin D as directed.  It is very important as a natural anti-inflammatory  helping hair, skin, and nails, as well as reducing stroke and heart attack risk.  It helps your bones and helps with mood. It also decreases numerous cancer risks so please take it as directed.  Low Vit D is associated with a 200-300% higher risk for CANCER  and 200-300% higher risk for HEART   ATTACK  &  STROKE.   ...................................... It is also associated with higher death rate at younger ages,  autoimmune diseases like Rheumatoid arthritis, Lupus, Multiple Sclerosis.    Also many other serious conditions, like depression, Alzheimer's Dementia, infertility, muscle aches, fatigue, fibromyalgia - just to name a few. ++++++++++++++++++ Recommend the book "The END of DIETING" by Dr Joel Fuhrman  & the book "The END of DIABETES " by Dr Joel Fuhrman At [employer] - get book & Audio CD's    Being diabetic has a  300% increased risk for heart attack, stroke, cancer, and alzheimer- type vascular dementia. It is very important that you work harder with diet by avoiding all foods that are white. Avoid white rice (brown & wild rice is OK), white potatoes (sweetpotatoes in moderation is OK),  White bread or wheat bread or anything made out of white flour like bagels, donuts, rolls, buns, biscuits, cakes, pastries, cookies, pizza crust, and pasta (made from white flour & egg whites) - vegetarian pasta or spinach or wheat pasta is OK. Multigrain breads like Arnold's or [employer], or multigrain sandwich thins or flatbreads.  Diet, exercise and weight loss can reverse and cure diabetes in the early stages.  Diet, exercise and weight loss is very important in the control and prevention of complications of diabetes which affects every system in your body, ie. Brain - dementia/stroke, eyes - glaucoma/blindness, heart - heart attack/heart failure, kidneys - dialysis, stomach - gastric paralysis, intestines - malabsorption, nerves - severe painful neuritis, circulation - gangrene & loss of a leg(s), and finally cancer and Alzheimers.    I recommend avoid fried & greasy foods,  sweets/candy, white rice (brown or wild rice or Quinoa is OK), white potatoes (sweet potatoes are OK) - anything made from white flour - bagels, doughnuts, rolls, buns, biscuits,white and wheat breads, pizza crust and traditional pasta made of white flour & egg white(vegetarian pasta or spinach or wheat pasta is OK).  Multi-grain bread is OK - like multi-grain flat bread or sandwich thins. Avoid alcohol in excess. Exercise is also important.    Eat all the vegetables you want - avoid meat, especially red meat and dairy - especially cheese.  Cheese is the most concentrated form of trans-fats which is the worst thing to clog up our arteries. Veggie cheese is OK   which can be found in the fresh produce section at [employer] or [employer] or [employer]  +++++++++++++++++++ DASH Eating Plan  DASH stands for "Dietary Approaches to Stop Hypertension."   The DASH eating plan is a healthy eating plan that has been shown to reduce high blood pressure (hypertension). Additional health benefits may include reducing the risk of type 2  diabetes mellitus, heart disease, and stroke. The DASH eating plan may also help with weight loss. WHAT DO I NEED TO KNOW ABOUT THE DASH EATING PLAN? For the DASH eating plan, you will follow these general guidelines: Choose foods with a percent daily value for sodium of less than 5% (as listed on the food label). Use salt-free seasonings or herbs instead of table salt or sea salt. Check with your health care provider or pharmacist before using salt substitutes. Eat lower-sodium products, often labeled as "lower sodium" or "no salt added." Eat fresh foods. Eat more vegetables, fruits, and low-fat dairy products. Choose whole grains. Look for the word "whole" as the first word in the ingredient list. Choose fish  Limit sweets, desserts, sugars, and sugary drinks. Choose heart-healthy fats. Eat veggie cheese  Eat more home-cooked food and less restaurant, buffet, and fast food. Limit fried foods. Cook foods using methods other than frying. Limit canned vegetables. If you do use them, rinse them well to decrease the sodium. When eating at a restaurant, ask that your food be prepared with less salt, or no salt if possible.                      WHAT FOODS CAN I EAT? Read Dr Joel Fuhrman's books on The End of Dieting & The End of Diabetes  Grains Whole grain or whole wheat bread. Brown rice. Whole grain or whole wheat pasta. Quinoa, bulgur, and whole grain cereals. Low-sodium cereals. Corn or whole wheat flour tortillas. Whole grain cornbread. Whole grain crackers. Low-sodium crackers.  Vegetables Fresh or frozen vegetables (raw, steamed, roasted, or grilled). Low-sodium or reduced-sodium tomato and vegetable juices. Low-sodium or reduced-sodium tomato sauce and paste. Low-sodium or reduced-sodium canned vegetables.   Fruits All fresh, canned (in natural juice), or frozen fruits.  Protein Products  All fish and seafood.  Dried beans, peas, or lentils. Unsalted nuts and seeds. Unsalted  canned beans.  Dairy Low-fat dairy products, such as skim or 1% milk, 2% or reduced-fat cheeses, low-fat ricotta or cottage cheese, or plain low-fat yogurt. Low-sodium or reduced-sodium cheeses.  Fats and Oils Tub margarines without trans fats. Light or reduced-fat mayonnaise and salad dressings (reduced sodium). Avocado. Safflower, olive, or canola oils. Natural peanut or almond butter.  Other Unsalted popcorn and pretzels. The items listed above may not be a complete list of recommended foods or beverages. Contact your dietitian for more options.  +++++++++++++++  WHAT FOODS ARE NOT RECOMMENDED? Grains/ White flour or wheat flour White bread. White pasta. White rice. Refined cornbread. Bagels and croissants. Crackers that contain trans fat.  Vegetables  Creamed or fried vegetables. Vegetables in a . Regular canned vegetables. Regular canned tomato sauce and paste. Regular tomato and vegetable juices.  Fruits Dried fruits. Canned fruit in light or heavy syrup. Fruit juice.  Meat and Other Protein Products Meat in general - RED meat & White meat.  Fatty cuts of meat. Ribs, chicken wings, all processed meats as bacon, sausage, bologna, salami, fatback, hot dogs, bratwurst and packaged luncheon meats.  Dairy Whole or 2% milk, cream, half-and-half, and cream cheese.   Whole-fat or sweetened yogurt. Full-fat cheeses or blue cheese. Non-dairy creamers and whipped toppings. Processed cheese, cheese spreads, or cheese curds.  Condiments Onion and garlic salt, seasoned salt, table salt, and sea salt. Canned and packaged gravies. Worcestershire sauce. Tartar sauce. Barbecue sauce. Teriyaki sauce. Soy sauce, including reduced sodium. Steak sauce. Fish sauce. Oyster sauce. Cocktail sauce. Horseradish. Ketchup and mustard. Meat flavorings and tenderizers. Bouillon cubes. Hot sauce. Tabasco sauce. Marinades. Taco seasonings. Relishes.  Fats and Oils Butter, stick margarine, lard, shortening and  bacon fat. Coconut, palm kernel, or palm oils. Regular salad dressings.  Pickles and olives. Salted popcorn and pretzels.  The items listed above may not be a complete list of foods and beverages to avoid.

## 2021-10-06 ENCOUNTER — Ambulatory Visit: Payer: Medicare PPO | Admitting: Internal Medicine

## 2021-10-06 ENCOUNTER — Encounter: Payer: Self-pay | Admitting: Internal Medicine

## 2021-10-06 VITALS — BP 104/70 | HR 70 | Temp 98.0°F | Resp 16 | Ht 65.5 in | Wt 140.6 lb

## 2021-10-06 DIAGNOSIS — Z136 Encounter for screening for cardiovascular disorders: Secondary | ICD-10-CM | POA: Diagnosis not present

## 2021-10-06 DIAGNOSIS — I10 Essential (primary) hypertension: Secondary | ICD-10-CM | POA: Diagnosis not present

## 2021-10-06 DIAGNOSIS — K219 Gastro-esophageal reflux disease without esophagitis: Secondary | ICD-10-CM | POA: Diagnosis not present

## 2021-10-06 DIAGNOSIS — Z8249 Family history of ischemic heart disease and other diseases of the circulatory system: Secondary | ICD-10-CM | POA: Diagnosis not present

## 2021-10-06 DIAGNOSIS — Z87891 Personal history of nicotine dependence: Secondary | ICD-10-CM

## 2021-10-06 DIAGNOSIS — G43109 Migraine with aura, not intractable, without status migrainosus: Secondary | ICD-10-CM | POA: Diagnosis not present

## 2021-10-06 DIAGNOSIS — Z1211 Encounter for screening for malignant neoplasm of colon: Secondary | ICD-10-CM

## 2021-10-06 DIAGNOSIS — E782 Mixed hyperlipidemia: Secondary | ICD-10-CM | POA: Diagnosis not present

## 2021-10-06 DIAGNOSIS — E559 Vitamin D deficiency, unspecified: Secondary | ICD-10-CM

## 2021-10-06 DIAGNOSIS — Z79899 Other long term (current) drug therapy: Secondary | ICD-10-CM

## 2021-10-06 DIAGNOSIS — I700 Atherosclerosis of aorta: Secondary | ICD-10-CM

## 2021-10-06 DIAGNOSIS — Z0000 Encounter for general adult medical examination without abnormal findings: Secondary | ICD-10-CM

## 2021-10-06 DIAGNOSIS — R7309 Other abnormal glucose: Secondary | ICD-10-CM

## 2021-10-06 DIAGNOSIS — Z8673 Personal history of transient ischemic attack (TIA), and cerebral infarction without residual deficits: Secondary | ICD-10-CM

## 2021-10-07 LAB — COMPLETE METABOLIC PANEL WITH GFR
AG Ratio: 1.6 (calc) (ref 1.0–2.5)
ALT: 16 U/L (ref 6–29)
AST: 25 U/L (ref 10–35)
Albumin: 4.1 g/dL (ref 3.6–5.1)
Alkaline phosphatase (APISO): 71 U/L (ref 37–153)
BUN: 15 mg/dL (ref 7–25)
CO2: 24 mmol/L (ref 20–32)
Calcium: 9.6 mg/dL (ref 8.6–10.4)
Chloride: 107 mmol/L (ref 98–110)
Creat: 0.81 mg/dL (ref 0.60–1.00)
Globulin: 2.6 g/dL (calc) (ref 1.9–3.7)
Glucose, Bld: 92 mg/dL (ref 65–99)
Potassium: 4.4 mmol/L (ref 3.5–5.3)
Sodium: 140 mmol/L (ref 135–146)
Total Bilirubin: 0.4 mg/dL (ref 0.2–1.2)
Total Protein: 6.7 g/dL (ref 6.1–8.1)
eGFR: 76 mL/min/{1.73_m2} (ref >=60)

## 2021-10-07 LAB — CBC WITH DIFFERENTIAL/PLATELET
Absolute Monocytes: 608 cells/uL (ref 200–950)
Basophils Absolute: 90 cells/uL (ref 0–200)
Basophils Relative: 1.4 %
Eosinophils Absolute: 128 cells/uL (ref 15–500)
Eosinophils Relative: 2 %
HCT: 35.9 % (ref 35.0–45.0)
Hemoglobin: 11.9 g/dL (ref 11.7–15.5)
Lymphs Abs: 2310 cells/uL (ref 850–3900)
MCH: 30.1 pg (ref 27.0–33.0)
MCHC: 33.1 g/dL (ref 32.0–36.0)
MCV: 90.9 fL (ref 80.0–100.0)
MPV: 10 fL (ref 7.5–12.5)
Monocytes Relative: 9.5 %
Neutro Abs: 3264 cells/uL (ref 1500–7800)
Neutrophils Relative %: 51 %
Platelets: 304 10*3/uL (ref 140–400)
RBC: 3.95 10*6/uL (ref 3.80–5.10)
RDW: 12.3 % (ref 11.0–15.0)
Total Lymphocyte: 36.1 %
WBC: 6.4 10*3/uL (ref 3.8–10.8)

## 2021-10-07 LAB — URINALYSIS, ROUTINE W REFLEX MICROSCOPIC
Bilirubin Urine: NEGATIVE
Glucose, UA: NEGATIVE
Hgb urine dipstick: NEGATIVE
Ketones, ur: NEGATIVE
Leukocytes,Ua: NEGATIVE
Nitrite: NEGATIVE
Protein, ur: NEGATIVE
Specific Gravity, Urine: 1.019 (ref 1.001–1.035)
pH: 5.5 (ref 5.0–8.0)

## 2021-10-07 LAB — LIPID PANEL
Cholesterol: 138 mg/dL (ref <200)
HDL: 57 mg/dL (ref >=50)
LDL Cholesterol (Calc): 61 mg/dL (calc)
Non-HDL Cholesterol (Calc): 81 mg/dL (calc) (ref <130)
Total CHOL/HDL Ratio: 2.4 (calc) (ref <5.0)
Triglycerides: 118 mg/dL (ref <150)

## 2021-10-07 LAB — MICROALBUMIN / CREATININE URINE RATIO
Creatinine, Urine: 135 mg/dL (ref 20–275)
Microalb Creat Ratio: 7 mcg/mg creat (ref <30)
Microalb, Ur: 0.9 mg/dL

## 2021-10-07 LAB — MAGNESIUM: Magnesium: 2.3 mg/dL (ref 1.5–2.5)

## 2021-10-07 LAB — VITAMIN D 25 HYDROXY (VIT D DEFICIENCY, FRACTURES): Vit D, 25-Hydroxy: 97 ng/mL (ref 30–100)

## 2021-10-07 LAB — TSH: TSH: 2.05 mIU/L (ref 0.40–4.50)

## 2021-10-07 LAB — HEMOGLOBIN A1C
Hgb A1c MFr Bld: 5.6 % of total Hgb (ref <5.7)
Mean Plasma Glucose: 114 mg/dL
eAG (mmol/L): 6.3 mmol/L

## 2021-10-07 LAB — INSULIN, RANDOM: Insulin: 5.5 u[IU]/mL

## 2021-10-10 LAB — LEVETIRACETAM, IMMUNOASSAY: LEVETIRACETAM, IMMUNOASSAY: 14.4 ug/mL (ref 6.0–46.0)

## 2021-10-19 ENCOUNTER — Other Ambulatory Visit: Payer: Self-pay | Admitting: Internal Medicine

## 2021-10-19 DIAGNOSIS — Z1231 Encounter for screening mammogram for malignant neoplasm of breast: Secondary | ICD-10-CM

## 2021-10-27 DIAGNOSIS — M25561 Pain in right knee: Secondary | ICD-10-CM | POA: Diagnosis not present

## 2021-10-27 DIAGNOSIS — Z96652 Presence of left artificial knee joint: Secondary | ICD-10-CM | POA: Diagnosis not present

## 2021-10-27 DIAGNOSIS — M25562 Pain in left knee: Secondary | ICD-10-CM | POA: Diagnosis not present

## 2021-11-08 ENCOUNTER — Emergency Department (HOSPITAL_COMMUNITY): Payer: Medicare PPO

## 2021-11-08 ENCOUNTER — Emergency Department (HOSPITAL_COMMUNITY)
Admission: EM | Admit: 2021-11-08 | Discharge: 2021-11-08 | Disposition: A | Discharge status: Home / Self Care | Payer: Medicare PPO | Attending: Emergency Medicine | Admitting: Emergency Medicine

## 2021-11-08 DIAGNOSIS — S52252A Displaced comminuted fracture of shaft of ulna, left arm, initial encounter for closed fracture: Secondary | ICD-10-CM | POA: Insufficient documentation

## 2021-11-08 DIAGNOSIS — S62611A Displaced fracture of proximal phalanx of left index finger, initial encounter for closed fracture: Secondary | ICD-10-CM | POA: Diagnosis not present

## 2021-11-08 DIAGNOSIS — S62611B Displaced fracture of proximal phalanx of left index finger, initial encounter for open fracture: Secondary | ICD-10-CM | POA: Diagnosis not present

## 2021-11-08 DIAGNOSIS — Z79899 Other long term (current) drug therapy: Secondary | ICD-10-CM | POA: Diagnosis not present

## 2021-11-08 DIAGNOSIS — S62321B Displaced fracture of shaft of second metacarpal bone, left hand, initial encounter for open fracture: Secondary | ICD-10-CM | POA: Diagnosis not present

## 2021-11-08 DIAGNOSIS — S62615B Displaced fracture of proximal phalanx of left ring finger, initial encounter for open fracture: Secondary | ICD-10-CM | POA: Diagnosis not present

## 2021-11-08 DIAGNOSIS — Z23 Encounter for immunization: Secondary | ICD-10-CM | POA: Diagnosis not present

## 2021-11-08 DIAGNOSIS — I10 Essential (primary) hypertension: Secondary | ICD-10-CM | POA: Diagnosis not present

## 2021-11-08 DIAGNOSIS — S62609B Fracture of unspecified phalanx of unspecified finger, initial encounter for open fracture: Secondary | ICD-10-CM

## 2021-11-08 DIAGNOSIS — S62615A Displaced fracture of proximal phalanx of left ring finger, initial encounter for closed fracture: Secondary | ICD-10-CM | POA: Diagnosis not present

## 2021-11-08 DIAGNOSIS — S62617B Displaced fracture of proximal phalanx of left little finger, initial encounter for open fracture: Secondary | ICD-10-CM | POA: Insufficient documentation

## 2021-11-08 DIAGNOSIS — Z7982 Long term (current) use of aspirin: Secondary | ICD-10-CM | POA: Diagnosis not present

## 2021-11-08 DIAGNOSIS — S62102A Fracture of unspecified carpal bone, left wrist, initial encounter for closed fracture: Secondary | ICD-10-CM

## 2021-11-08 DIAGNOSIS — S52602A Unspecified fracture of lower end of left ulna, initial encounter for closed fracture: Secondary | ICD-10-CM | POA: Diagnosis not present

## 2021-11-08 DIAGNOSIS — Y93K9 Activity, other involving animal care: Secondary | ICD-10-CM | POA: Insufficient documentation

## 2021-11-08 DIAGNOSIS — R531 Weakness: Secondary | ICD-10-CM | POA: Diagnosis not present

## 2021-11-08 DIAGNOSIS — S6992XA Unspecified injury of left wrist, hand and finger(s), initial encounter: Secondary | ICD-10-CM | POA: Diagnosis present

## 2021-11-08 DIAGNOSIS — S61452A Open bite of left hand, initial encounter: Secondary | ICD-10-CM | POA: Diagnosis not present

## 2021-11-08 DIAGNOSIS — S62630A Displaced fracture of distal phalanx of right index finger, initial encounter for closed fracture: Secondary | ICD-10-CM | POA: Diagnosis not present

## 2021-11-08 DIAGNOSIS — S6292XA Unspecified fracture of left wrist and hand, initial encounter for closed fracture: Secondary | ICD-10-CM | POA: Diagnosis not present

## 2021-11-08 DIAGNOSIS — W540XXA Bitten by dog, initial encounter: Secondary | ICD-10-CM | POA: Insufficient documentation

## 2021-11-08 DIAGNOSIS — S52502A Unspecified fracture of the lower end of left radius, initial encounter for closed fracture: Secondary | ICD-10-CM | POA: Diagnosis not present

## 2021-11-08 DIAGNOSIS — S62327B Displaced fracture of shaft of fifth metacarpal bone, left hand, initial encounter for open fracture: Secondary | ICD-10-CM | POA: Diagnosis not present

## 2021-11-08 MED ORDER — TETANUS-DIPHTH-ACELL PERTUSSIS 5-2.5-18.5 LF-MCG/0.5 IM SUSY
0.5000 mL | PREFILLED_SYRINGE | Freq: Once | INTRAMUSCULAR | Status: AC
  Administered 2021-11-08: 0.5 mL via INTRAMUSCULAR
  Filled 2021-11-08: qty 0.5

## 2021-11-08 MED ORDER — AMOXICILLIN-POT CLAVULANATE 875-125 MG PO TABS: 1.0000 | ORAL_TABLET | Freq: Two times a day (BID) | ORAL | 0 refills | Status: AC

## 2021-11-08 MED ORDER — CEFAZOLIN SODIUM-DEXTROSE 1-4 GM/50ML-% IV SOLN
1.0000 g | Freq: Once | INTRAVENOUS | Status: AC
  Administered 2021-11-08: 1 g via INTRAVENOUS
  Filled 2021-11-08: qty 50

## 2021-11-08 MED ORDER — OXYCODONE-ACETAMINOPHEN 5-325 MG PO TABS: 1.0000 | ORAL_TABLET | ORAL | 0 refills | Status: DC | PRN

## 2021-11-08 MED ORDER — FENTANYL CITRATE PF 50 MCG/ML IJ SOSY
50.0000 ug | PREFILLED_SYRINGE | Freq: Once | INTRAMUSCULAR | Status: AC
  Administered 2021-11-08: 50 ug via INTRAVENOUS
  Filled 2021-11-08: qty 1

## 2021-11-08 MED ORDER — CEFAZOLIN SODIUM-DEXTROSE 1-4 GM/50ML-% IV SOLN: 1.0000 g | Freq: Once | INTRAVENOUS | Status: DC

## 2021-11-08 MED ORDER — OXYCODONE-ACETAMINOPHEN 5-325 MG PO TABS
1.0000 | ORAL_TABLET | Freq: Once | ORAL | Status: AC
  Administered 2021-11-08: 1 via ORAL
  Filled 2021-11-08: qty 1

## 2021-11-08 NOTE — ED Triage Notes (Signed)
Pt reports she was trying to break up a fight between two dogs and one of the dogs bit her hand. Pt with lacerations, wounds to the left hand with obvious deformities to fingers. Occurred around 0600 this morning.

## 2021-11-08 NOTE — Consult Note (Signed)
Reason for Consult:Dog bite hand Referring Physician: Gerald Stabs Tegeler Time called: [id number] Time at bedside: Deborah [name] is an 76 y.o. female.  HPI: Deborah [name] was bitten by her son's dog this morning. She was brought to the ED where x-rays showed multiple fxs and hand surgery was consulted. She is RHD and retired.  Past Medical History:  Diagnosis Date   Allergy    Anemia    Anxiety    Aortic atherosclerosis ([place]) 11/2016   Noted on CT abd   Arthritis    hands and knees   Bilateral cataracts    History of   Cellulitis    Right finger   Cerebral amyloid angiopathy ([facility])    Cervical spondylosis 12/2008   Noted on Cervical spine   Chronic nausea    Compression fracture of T12 vertebra ([place]) 06/07/2017   Mild   DDD (degenerative disc disease), cervical 12/2008   Noted on Cervical spine   Delusions ([place]) 03/24/2018   Depression    Encephalopathy 01/02/2017   Mild, [place] EEG   GERD (gastroesophageal reflux disease)    Helicobacter pylori infection 2018   History of appendicitis    age 81   History of colon polyps    History of confusion    History of hiatal hernia 10/16/2016   Noted on Endoscopy   Hyperlipidemia    Incontinence in female 08/06/2014   Internal hemorrhoid 08/02/2017   Noted on Colonoscopy   Low blood pressure reading    Migraines    Osteopenia    PONV (postoperative nausea and vomiting)    Pre-diabetes    RLS (restless legs syndrome)    Seizure ([place])    atypical partial comples vs. confusional migraines   Seizure disorder ([place]) 12/15/2016   Skin cancer    right lower eyelid   Squamous cell skin cancer    history of neck and upper back   Syncope 03/25/2018   TIA (transient ischemic attack)    questionable per patient   Vitamin D deficiency     Past Surgical History:  Procedure Laterality Date   APPENDECTOMY  1959   CATARACT EXTRACTION, BILATERAL Bilateral 2014   CESAREAN SECTION  1978   COLONOSCOPY W/ POLYPECTOMY  08/02/2017   KNEE  ARTHROSCOPY Bilateral 04/2014   [facility]   MOHS SURGERY Right 04/2013   Squamous cell cacinoma, neck/upper back   SPINE SURGERY  2010   C6-C7 fusion   TOTAL KNEE ARTHROPLASTY Left 03/22/2018   Procedure: LEFT TOTAL KNEE ARTHROPLASTY;  Surgeon: Dorna Leitz, MD;  Location: [facility];  Service: Orthopedics;  Laterality: Left;  Adductor Block   UPPER GI ENDOSCOPY  10/16/2016    Family History  Problem Relation Age of Onset   Diabetes Mother    Heart disease Mother    Heart disease Father    Stroke Father    Hypertension Father    Hyperlipidemia Sister    Hypertension Brother     Social History:  reports that she quit smoking about 35 years ago. Her smoking use included cigarettes. She has a 4.00 pack-year smoking history. She has never used smokeless tobacco. She reports that she does not currently use alcohol. She reports that she does not use drugs.  Allergies:  Allergies  Allergen Reactions   Banana Nausea And Vomiting   Chocolate Other (See Comments)    Unknown   Effexor [Venlafaxine] Other (See Comments)    insomnia   Maxalt [Rizatriptan Benzoate] Nausea Only and Other (See  Comments)    Nausea and excessive salivation   Other Other (See Comments)    NUTS (all kinds)   Tussin [Guaifenesin] Rash   Zostavax [Zoster Vaccine Live] Itching and Rash    Medications: I have reviewed the patient's current medications.  No results found for this or any previous visit (from the past 48 hour(s)).  DG Wrist Complete Left  Result Date: 11/08/2021 CLINICAL DATA:  Dog bites. EXAM: LEFT WRIST - COMPLETE 3+ VIEW; LEFT HAND - COMPLETE 3+ VIEW COMPARISON:  None Available. FINDINGS: There are comminuted and displaced fractures of the second, fourth fifth proximal phalanges. There is also a comminuted and mildly displaced fracture of the distal ulnar shaft. No radiopaque foreign bodies. No intra-articular involvement. No carpal or metacarpal bone fractures. IMPRESSION: Comminuted and  displaced fractures of the second, fourth, fifth proximal phalanges and distal ulnar shaft. Electronically Signed   By: Marijo Sanes M.D.   On: 11/08/2021 08:10   DG Hand Complete Left  Result Date: 11/08/2021 CLINICAL DATA:  Dog bites. EXAM: LEFT WRIST - COMPLETE 3+ VIEW; LEFT HAND - COMPLETE 3+ VIEW COMPARISON:  None Available. FINDINGS: There are comminuted and displaced fractures of the second, fourth fifth proximal phalanges. There is also a comminuted and mildly displaced fracture of the distal ulnar shaft. No radiopaque foreign bodies. No intra-articular involvement. No carpal or metacarpal bone fractures. IMPRESSION: Comminuted and displaced fractures of the second, fourth, fifth proximal phalanges and distal ulnar shaft. Electronically Signed   By: Marijo Sanes M.D.   On: 11/08/2021 08:10    Review of Systems  HENT:  Negative for ear discharge, ear pain, hearing loss and tinnitus.   Eyes:  Negative for photophobia and pain.  Respiratory:  Negative for cough and shortness of breath.   Cardiovascular:  Negative for chest pain.  Gastrointestinal:  Negative for abdominal pain, nausea and vomiting.  Genitourinary:  Negative for dysuria, flank pain, frequency and urgency.  Musculoskeletal:  Positive for arthralgias (Left hand/wrist). Negative for back pain, myalgias and neck pain.  Neurological:  Negative for dizziness and headaches.  Hematological:  Does not bruise/bleed easily.  Psychiatric/Behavioral:  The patient is not nervous/anxious.    Blood pressure 130/76, pulse 66, temperature 98.1 F (36.7 C), temperature source Oral, resp. rate 16, SpO2 98 %. Physical Exam Constitutional:      General: She is not in acute distress.    Appearance: She is well-developed. She is not diaphoretic.  HENT:     Head: Normocephalic and atraumatic.  Eyes:     General: No scleral icterus.       Right eye: No discharge.        Left eye: No discharge.     Conjunctiva/sclera: Conjunctivae normal.   Cardiovascular:     Rate and Rhythm: Normal rate and regular rhythm.  Pulmonary:     Effort: Pulmonary effort is normal. No respiratory distress.  Musculoskeletal:     Cervical back: Normal range of motion.     Comments: Left shoulder, elbow, wrist, digits- Numerous puncture wounds P1 of 2-5th fingers, paresthesias of 2-5th fingers, good cap refill all fingers, mod-severe TTP same as well as ulnar wrist, no instability, no blocks to motion  Sens  Ax/R/M/U intact  Mot   Ax/ R/ PIN/ M/ AIN/ U intact  Rad 2+  Skin:    General: Skin is warm and dry.  Neurological:     Mental Status: She is alert.  Psychiatric:        Mood  and Affect: Mood normal.        Behavior: Behavior normal.     Assessment/Plan: Left hand dog bites w/mult fxs -- Plan I&D and splinting by EDP then f/u with Dr. Caralyn Guile today in office to discuss surgery.    Lisette Abu, PA-C Orthopedic Surgery [phone] 11/08/2021, 9:17 AM

## 2021-11-08 NOTE — ED Provider Notes (Signed)
[facility] EMERGENCY DEPARTMENT Provider Note   CSN: [id number] Arrival date & time: 11/08/21  3474     History  Chief Complaint  Patient presents with   Animal Bite    Deborah [name] is a 76 y.o. female.  The history is provided by the patient and medical records. No language interpreter was used.  Animal Bite Contact animal:  Dog Location:  Finger and hand Hand injury location:  L hand Time since incident:  1 hour Pain details:    Quality:  Aching   Severity:  Severe Animal's rabies vaccination status:  Up to date Animal in possession: yes   Tetanus status:  Unknown Relieved by:  Nothing Worsened by:  Nothing Ineffective treatments:  None tried Associated symptoms: numbness and swelling   Associated symptoms: no fever and no rash        Home Medications Prior to Admission medications   Medication Sig Start Date End Date Taking? Authorizing Provider  alendronate (FOSAMAX) 70 MG tablet Take      1 tablet      every 7 days       with a full glass of water       on an empty stomach       for 60 minutes 01/31/20   Unk Pinto, MD  ALPRAZolam Duanne Moron) 0.5 MG tablet TAKE 1/2 TO 1 TABLET BY MOUTH 2 TIMES A DAILY ONLY IF NEEDED FOR ANXIETY ATTACK AND LIMIT TO 5 DAYS A WEEK TO AVOID ADDICTION 07/26/21   Liane Comber, NP  aspirin EC 81 MG tablet Take 1 tablet (81 mg total) by mouth daily. 11/30/20   Donato Heinz, MD  busPIRone (BUSPAR) 10 MG tablet Take  1 tablet  3 x /day  as needed for Nerves                                          /         TAKE 1 TABLET BY MOUTH THREE TIMES DAILY Patient taking differently: Take 10 mg by mouth 3 (three) times daily as needed (nerves). 01/25/21   Unk Pinto, MD  Cholecalciferol (VITAMIN D3) 125 MCG (5000 UT) CAPS Take 5,000 Units by mouth daily.    [provider]  diclofenac Sodium (VOLTAREN) 1 % GEL Apply 2 g topically 4 (four) times daily. 06/29/21   Liane Comber, NP  diltiazem  (CARDIZEM CD) 120 MG 24 hr capsule Take 1 capsule at Night  for BP & Prevent Migraines 04/27/21   Unk Pinto, MD  escitalopram (LEXAPRO) 10 MG tablet Take 1 tablet by mouth once daily 08/16/21   Alycia Rossetti, NP  ezetimibe (ZETIA) 10 MG tablet TAKE 1 TABLET BY MOUTH ONCE DAILY FOR CHOLESTEROL 08/16/21   Alycia Rossetti, NP  fluticasone (FLONASE) 50 MCG/ACT nasal spray Place 1 spray into both nostrils 2 (two) times daily. 03/09/21   [provider]  levETIRAcetam (KEPPRA) 500 MG tablet Take 500 mg by mouth 2 (two) times daily.    [provider]  Multiple Vitamins-Minerals (MULTIVITAMIN PO) Take 1 tablet by mouth daily.    [provider]  rosuvastatin (CRESTOR) 40 MG tablet Take 1 tablet (40 mg total) by mouth daily. for cholesterol. 06/08/21   Donato Heinz, MD      Allergies    Banana, Chocolate, Effexor [venlafaxine], Maxalt [rizatriptan benzoate], Other, Tussin [  guaifenesin], and Zostavax [zoster vaccine live]    Review of Systems   Review of Systems  Constitutional:  Negative for chills, fatigue and fever.  Respiratory:  Negative for shortness of breath.   Cardiovascular:  Negative for chest pain.  Gastrointestinal:  Negative for abdominal pain, constipation, diarrhea and nausea.  Genitourinary:  Negative for flank pain.  Musculoskeletal:  Negative for back pain, neck pain and neck stiffness.  Skin:  Positive for wound. Negative for rash.  Neurological:  Positive for weakness and numbness. Negative for dizziness and light-headedness.  Psychiatric/Behavioral:  Negative for agitation and confusion.   All other systems reviewed and are negative.   Physical Exam Updated Vital Signs BP 131/81 (BP Location: Right Arm)   Pulse 67   Temp 98.1 F (36.7 C) (Oral)   Resp 16   SpO2 97%  Physical Exam Vitals and nursing note reviewed.  Constitutional:      General: She is not in acute distress.    Appearance: She is well-developed. She is not  ill-appearing, toxic-appearing or diaphoretic.  HENT:     Head: Normocephalic and atraumatic.  Eyes:     Conjunctiva/sclera: Conjunctivae normal.  Cardiovascular:     Rate and Rhythm: Normal rate and regular rhythm.     Heart sounds: No murmur heard. Pulmonary:     Effort: Pulmonary effort is normal. No respiratory distress.     Breath sounds: Normal breath sounds. No wheezing, rhonchi or rales.  Chest:     Chest wall: No tenderness.  Abdominal:     Palpations: Abdomen is soft.     Tenderness: There is no abdominal tenderness. There is no guarding or rebound.  Musculoskeletal:        General: Swelling, tenderness, deformity and signs of injury present.     Cervical back: Neck supple.  Skin:    General: Skin is warm and dry.     Capillary Refill: Capillary refill takes less than 2 seconds.  Neurological:     Mental Status: She is alert.     Sensory: Sensory deficit present.     Motor: Weakness present.  Psychiatric:        Mood and Affect: Mood normal.         ED Results / Procedures / Treatments   Labs (all labs ordered are listed, but only abnormal results are displayed) Labs Reviewed - No data to display  EKG None  Radiology DG Wrist Complete Left  Result Date: 11/08/2021 CLINICAL DATA:  Dog bites. EXAM: LEFT WRIST - COMPLETE 3+ VIEW; LEFT HAND - COMPLETE 3+ VIEW COMPARISON:  None Available. FINDINGS: There are comminuted and displaced fractures of the second, fourth fifth proximal phalanges. There is also a comminuted and mildly displaced fracture of the distal ulnar shaft. No radiopaque foreign bodies. No intra-articular involvement. No carpal or metacarpal bone fractures. IMPRESSION: Comminuted and displaced fractures of the second, fourth, fifth proximal phalanges and distal ulnar shaft. Electronically Signed   By: Marijo Sanes M.D.   On: 11/08/2021 08:10   DG Hand Complete Left  Result Date: 11/08/2021 CLINICAL DATA:  Dog bites. EXAM: LEFT WRIST - COMPLETE 3+  VIEW; LEFT HAND - COMPLETE 3+ VIEW COMPARISON:  None Available. FINDINGS: There are comminuted and displaced fractures of the second, fourth fifth proximal phalanges. There is also a comminuted and mildly displaced fracture of the distal ulnar shaft. No radiopaque foreign bodies. No intra-articular involvement. No carpal or metacarpal bone fractures. IMPRESSION: Comminuted and displaced fractures of the second, fourth,  fifth proximal phalanges and distal ulnar shaft. Electronically Signed   By: Marijo Sanes M.D.   On: 11/08/2021 08:10    Procedures Procedures    Medications Ordered in ED Medications  ceFAZolin (ANCEF) IVPB 1 g/50 mL premix (1 g Intravenous Not Given 11/08/21 1026)  Tdap (BOOSTRIX) injection 0.5 mL (0.5 mLs Intramuscular Given 11/08/21 0815)  fentaNYL (SUBLIMAZE) injection 50 mcg (50 mcg Intravenous Given 11/08/21 0812)  ceFAZolin (ANCEF) IVPB 1 g/50 mL premix (0 g Intravenous Stopped 11/08/21 0903)  oxyCODONE-acetaminophen (PERCOCET/ROXICET) 5-325 MG per tablet 1 tablet (1 tablet Oral Given 11/08/21 1022)    ED Course/ Medical Decision Making/ A&P                           Medical Decision Making Amount and/or Complexity of Data Reviewed Radiology: ordered.  Risk Prescription drug management.    CEDRICA BRUNE is a 76 y.o. female with a past medical history significant for hypertension, hyperlipidemia, anxiety, previous TIA, GERD, and osteopenia who presents with left hand and wrist injury.  According to patient, around 6 AM, patient tried to break up a fight between her dog and a neighbor dog when she was bitten and shaken by a dog.  She reports that she sustained injuries to her left hand and wrist.  She is right-handed.  She reports that there was immediate onset of deformity to the wrist and fingers with multiple punctures and wounds.  She wrapped it up and brought in for evaluation.  She reports there is some tingling in her fingers and severe pain.  She is very concerned  about multiple fractures.  She is unsure of her last tetanus shot and denies any allergies to antibiotics to her knowledge.  She denies any other preceding symptoms and denies any other injuries.  On exam, lungs clear and chest nontender.  Patient had no tenderness in the shoulder or elbow but does have deformity and tenderness of the wrist.  She had intact pulses on my wrist exam.  She had tenderness throughout the hand and deformity to the fingers.  There are multiple lacerations on the digits and she is reporting there is some tingling sensation on several fingers.  She does however appear to have capillary refill distally in the fingers at this time but is having a difficult time moving them.  Clinically I am concerned about possible multiple open fractures and a closed wrist fracture.  We will give her Tdap and Ancef for suspected open fracture and get x-rays.  Dissipate discussion with orthopedic hand team to determine further management.  9:14 AM Orthopedics came and saw the patient.  Hilbert Odor, PA-C, with the hand team assessed patient and spoke with Dr. Apolonio Schneiders and they requested bedside washing out followed by Xeroform wrapping and dressing with a ulnar gutter splint with a volar plate that help stabilize the fingers.  They want to see her in clinic later today to discuss likely surgery tomorrow.  We will do this and give her prescription for pain medicine and Augmentin and she will follow-up with hand today.  10:32 AM I just washed the fingers again and wrapped them in Xeroform as requested.  She will be splinted and discharged to follow-up with orthopedics today.  11:01 AM Just reassessed after splint was placed and she still has unchanged sensation in her fingers but still has capillary refill.  We will discharge so she can see orthopedics today to continue her plan.  Final Clinical Impression(s) / ED Diagnoses Final diagnoses:  Dog bite of left hand, initial  encounter  Multiple open fractures of finger, initial encounter  Closed fracture of left wrist, initial encounter    Rx / DC Orders ED Discharge Orders          Ordered    amoxicillin-clavulanate (AUGMENTIN) 875-125 MG tablet  Every 12 hours        11/08/21 1036    oxyCODONE-acetaminophen (PERCOCET/ROXICET) 5-325 MG tablet  Every 4 hours PRN        11/08/21 1036            Clinical Impression: 1. Dog bite of left hand, initial encounter   2. Multiple open fractures of finger, initial encounter   3. Closed fracture of left wrist, initial encounter     Disposition: Discharge  Condition: Good  I have discussed the results, Dx and Tx plan with the pt(& family if present). He/she/they expressed understanding and agree(s) with the plan. Discharge instructions discussed at great length. Strict return precautions discussed and pt &/or family have verbalized understanding of the instructions. No further questions at time of discharge.    New Prescriptions   AMOXICILLIN-CLAVULANATE (AUGMENTIN) 875-125 MG TABLET    Take 1 tablet by mouth every 12 (twelve) hours for 10 days.   OXYCODONE-ACETAMINOPHEN (PERCOCET/ROXICET) 5-325 MG TABLET    Take 1 tablet by mouth every 4 (four) hours as needed for severe pain.    Follow Up: [facility], [place] [place] [address] [phone]  Go today   [place] [address] [id number] mc [place]        Tuvia Woodrick, Gwenyth Allegra, MD 11/08/21 1102

## 2021-11-08 NOTE — ED Notes (Signed)
Ortho Called for splint.

## 2021-11-08 NOTE — Discharge Instructions (Addendum)
Your history, exam, work-up today revealed multiple lacerations from the dog bite as well as multiple fractures in the fingers and wrist.  You have a fractured distal ulna of the wrist, fractured second, fourth, and fifth fingers and where the lacerations are I am concerned are open fractures.  Your finger was washed out both by myself and the nursing team and you were updated with your tetanus and given IV antibiotics.  The hand orthopedics team requested it being wrapped with Xeroform and splinted so they can see you in clinic today for likely surgery tomorrow.  Please go to the office today to be seen and pick up your prescription for the antibiotics and pain medicine.  Please follow-up on what ever recommendations they have after further intervention.  If symptoms start to change or worsen acutely, please return to the nearest emergency department.

## 2021-11-09 ENCOUNTER — Ambulatory Visit: Payer: Medicare PPO

## 2021-11-09 DIAGNOSIS — S52602A Unspecified fracture of lower end of left ulna, initial encounter for closed fracture: Secondary | ICD-10-CM | POA: Diagnosis not present

## 2021-11-09 DIAGNOSIS — S62617B Displaced fracture of proximal phalanx of left little finger, initial encounter for open fracture: Secondary | ICD-10-CM | POA: Diagnosis not present

## 2021-11-09 DIAGNOSIS — S62627A Displaced fracture of medial phalanx of left little finger, initial encounter for closed fracture: Secondary | ICD-10-CM | POA: Diagnosis not present

## 2021-11-09 DIAGNOSIS — S66391A Other injury of extensor muscle, fascia and tendon of left index finger at wrist and hand level, initial encounter: Secondary | ICD-10-CM | POA: Diagnosis not present

## 2021-11-09 DIAGNOSIS — S52612A Displaced fracture of left ulna styloid process, initial encounter for closed fracture: Secondary | ICD-10-CM | POA: Diagnosis not present

## 2021-11-09 DIAGNOSIS — S62615B Displaced fracture of proximal phalanx of left ring finger, initial encounter for open fracture: Secondary | ICD-10-CM | POA: Diagnosis not present

## 2021-11-09 DIAGNOSIS — S62615A Displaced fracture of proximal phalanx of left ring finger, initial encounter for closed fracture: Secondary | ICD-10-CM | POA: Diagnosis not present

## 2021-11-09 DIAGNOSIS — S62611B Displaced fracture of proximal phalanx of left index finger, initial encounter for open fracture: Secondary | ICD-10-CM | POA: Diagnosis not present

## 2021-11-14 DIAGNOSIS — M79642 Pain in left hand: Secondary | ICD-10-CM | POA: Diagnosis not present

## 2021-11-14 DIAGNOSIS — S62619D Displaced fracture of proximal phalanx of unspecified finger, subsequent encounter for fracture with routine healing: Secondary | ICD-10-CM | POA: Diagnosis not present

## 2021-11-14 DIAGNOSIS — Z4789 Encounter for other orthopedic aftercare: Secondary | ICD-10-CM | POA: Diagnosis not present

## 2021-11-14 DIAGNOSIS — S62321B Displaced fracture of shaft of second metacarpal bone, left hand, initial encounter for open fracture: Secondary | ICD-10-CM | POA: Diagnosis not present

## 2021-11-14 DIAGNOSIS — S52602D Unspecified fracture of lower end of left ulna, subsequent encounter for closed fracture with routine healing: Secondary | ICD-10-CM | POA: Diagnosis not present

## 2021-11-24 DIAGNOSIS — S62321B Displaced fracture of shaft of second metacarpal bone, left hand, initial encounter for open fracture: Secondary | ICD-10-CM | POA: Diagnosis not present

## 2021-11-24 DIAGNOSIS — Z4789 Encounter for other orthopedic aftercare: Secondary | ICD-10-CM | POA: Diagnosis not present

## 2021-11-24 DIAGNOSIS — S62619D Displaced fracture of proximal phalanx of unspecified finger, subsequent encounter for fracture with routine healing: Secondary | ICD-10-CM | POA: Diagnosis not present

## 2021-11-24 DIAGNOSIS — S62327B Displaced fracture of shaft of fifth metacarpal bone, left hand, initial encounter for open fracture: Secondary | ICD-10-CM | POA: Diagnosis not present

## 2021-11-24 DIAGNOSIS — S52602D Unspecified fracture of lower end of left ulna, subsequent encounter for closed fracture with routine healing: Secondary | ICD-10-CM | POA: Diagnosis not present

## 2021-12-06 DIAGNOSIS — M25642 Stiffness of left hand, not elsewhere classified: Secondary | ICD-10-CM | POA: Diagnosis not present

## 2021-12-06 DIAGNOSIS — M25632 Stiffness of left wrist, not elsewhere classified: Secondary | ICD-10-CM | POA: Diagnosis not present

## 2021-12-08 DIAGNOSIS — S62321B Displaced fracture of shaft of second metacarpal bone, left hand, initial encounter for open fracture: Secondary | ICD-10-CM | POA: Diagnosis not present

## 2021-12-08 DIAGNOSIS — Z4789 Encounter for other orthopedic aftercare: Secondary | ICD-10-CM | POA: Diagnosis not present

## 2021-12-08 DIAGNOSIS — S52602D Unspecified fracture of lower end of left ulna, subsequent encounter for closed fracture with routine healing: Secondary | ICD-10-CM | POA: Diagnosis not present

## 2021-12-08 DIAGNOSIS — S62619D Displaced fracture of proximal phalanx of unspecified finger, subsequent encounter for fracture with routine healing: Secondary | ICD-10-CM | POA: Diagnosis not present

## 2021-12-08 DIAGNOSIS — S62327B Displaced fracture of shaft of fifth metacarpal bone, left hand, initial encounter for open fracture: Secondary | ICD-10-CM | POA: Diagnosis not present

## 2021-12-13 DIAGNOSIS — M25642 Stiffness of left hand, not elsewhere classified: Secondary | ICD-10-CM | POA: Diagnosis not present

## 2021-12-13 DIAGNOSIS — M25632 Stiffness of left wrist, not elsewhere classified: Secondary | ICD-10-CM | POA: Diagnosis not present

## 2021-12-14 ENCOUNTER — Ambulatory Visit
Admission: RE | Admit: 2021-12-14 | Discharge: 2021-12-14 | Disposition: A | Discharge status: Home / Self Care | Payer: Medicare PPO | Source: Ambulatory Visit | Attending: Physician Assistant | Admitting: Physician Assistant

## 2021-12-14 VITALS — BP 108/72 | HR 96 | Temp 98.2°F | Resp 16

## 2021-12-14 DIAGNOSIS — L500 Allergic urticaria: Secondary | ICD-10-CM | POA: Diagnosis not present

## 2021-12-14 MED ORDER — LORATADINE 5 MG PO TBDP
5.0000 mg | ORAL_TABLET | Freq: Every day | ORAL | 0 refills | Status: DC
Start: 2021-12-14 — End: 2022-01-31

## 2021-12-14 MED ORDER — TRIAMCINOLONE ACETONIDE 0.1 % EX CREA: 1.0000 | TOPICAL_CREAM | Freq: Two times a day (BID) | CUTANEOUS | 0 refills | Status: DC

## 2021-12-14 MED ORDER — PREDNISONE 10 MG (21) PO TBPK: ORAL_TABLET | ORAL | 0 refills | Status: DC

## 2021-12-14 NOTE — ED Triage Notes (Signed)
Pt c/o rash to anterior elbows, upper right arm, foot, back onset ~ 2-3 days ago. Pt states that it itches. No crusting or oozing. Denies pain.

## 2021-12-14 NOTE — ED Provider Notes (Signed)
[facility]    CSN: [id number] Arrival date & time: 12/14/21  1341      History   Chief Complaint Chief Complaint  Patient presents with   Rash    HPI Deborah [name] is a 76 y.o. female.   Patient presents today with a 3-day history of spreading urticaria.  Reports associated pruritus but denies any pain.  Reports symptoms began on her left forearm but have spread to involve bilateral upper and lower extremities as well as her trunk.  She denies any new exposures including to plants, insects, animals.  Denies any changes to personal hygiene products including soaps or detergents.  Denies any recent medication changes.  She has tried topical anti-itch cream without improvement of symptoms.  She denies any shortness of breath, throat swelling, dysphagia, odynophagia, muffled voice.  Denies history of dermatological condition.    Past Medical History:  Diagnosis Date   Allergy    Anemia    Anxiety    Aortic atherosclerosis ([place]) 11/2016   Noted on CT abd   Arthritis    hands and knees   Bilateral cataracts    History of   Cellulitis    Right finger   Cerebral amyloid angiopathy (CODE)    Cervical spondylosis 12/2008   Noted on Cervical spine   Chronic nausea    Compression fracture of T12 vertebra ([place]) 06/07/2017   Mild   DDD (degenerative disc disease), cervical 12/2008   Noted on Cervical spine   Delusions ([place]) 03/24/2018   Depression    Encephalopathy 01/02/2017   Mild, [place] [facility]   GERD (gastroesophageal reflux disease)    Helicobacter pylori infection 2018   History of appendicitis    age 63   History of colon polyps    History of confusion    History of hiatal hernia 10/16/2016   Noted on Endoscopy   Hyperlipidemia    Incontinence in female 08/06/2014   Internal hemorrhoid 08/02/2017   Noted on Colonoscopy   Low blood pressure reading    Migraines    Osteopenia    PONV (postoperative nausea and vomiting)    Pre-diabetes    RLS  (restless legs syndrome)    Seizure (HCC)    atypical partial comples vs. confusional migraines   Seizure disorder ([place]) 12/15/2016   Skin cancer    right lower eyelid   Squamous cell skin cancer    history of neck and upper back   Syncope 03/25/2018   TIA (transient ischemic attack)    questionable per patient   Vitamin D deficiency     Patient Active Problem List   Diagnosis Date Noted   Primary osteoarthritis of left knee 03/22/2018   GERD (gastroesophageal reflux disease) 02/25/2018   Osteopenia 02/25/2018   Compression fracture of T12 vertebra ([place]) 05/30/2017   Aortic atherosclerosis ([place]) by Abd CT scan  11/2016 02/03/2017   History of TIA (transient ischemic attack) 12/15/2016   BMI 22.0-22.9, adult 03/12/2015   History of skin cancer 08/06/2014   Migraine 08/06/2014   Generalized anxiety disorder 08/06/2014   Abnormal glucose 07/01/2013   Medication management 07/01/2013   Hypertension    Hyperlipidemia    Vitamin D deficiency     Past Surgical History:  Procedure Laterality Date   APPENDECTOMY  1959   CATARACT EXTRACTION, BILATERAL Bilateral 2014   CESAREAN SECTION  1978   COLONOSCOPY W/ POLYPECTOMY  08/02/2017   KNEE ARTHROSCOPY Bilateral 04/2014   [facility]   MOHS SURGERY  Right 04/2013   Squamous cell cacinoma, neck/upper back   SPINE SURGERY  2010   C6-C7 fusion   TOTAL KNEE ARTHROPLASTY Left 03/22/2018   Procedure: LEFT TOTAL KNEE ARTHROPLASTY;  Surgeon: Dorna Leitz, MD;  Location: [facility];  Service: Orthopedics;  Laterality: Left;  Adductor Block   UPPER GI ENDOSCOPY  10/16/2016    OB History   No obstetric history on file.      Home Medications    Prior to Admission medications   Medication Sig Start Date End Date Taking? Authorizing Provider  Loratadine 5 MG TBDP Take 1 tablet (5 mg total) by mouth daily. 12/14/21  Yes Wiley Magan K, PA-C  predniSONE (STERAPRED UNI-PAK 21 TAB) 10 MG (21) TBPK tablet As directed 12/14/21  Yes Alesa Echevarria  K, PA-C  triamcinolone cream (KENALOG) 0.1 % Apply 1 Application topically 2 (two) times daily. 12/14/21  Yes Wildon Cuevas, Derry Skill, PA-C  alendronate (FOSAMAX) 70 MG tablet Take      1 tablet      every 7 days       with a full glass of water       on an empty stomach       for 60 minutes 01/31/20   Unk Pinto, MD  ALPRAZolam Duanne Moron) 0.5 MG tablet TAKE 1/2 TO 1 TABLET BY MOUTH 2 TIMES A DAILY ONLY IF NEEDED FOR ANXIETY ATTACK AND LIMIT TO 5 DAYS A WEEK TO AVOID ADDICTION 07/26/21   Liane Comber, NP  aspirin EC 81 MG tablet Take 1 tablet (81 mg total) by mouth daily. 11/30/20   Donato Heinz, MD  busPIRone (BUSPAR) 10 MG tablet Take  1 tablet  3 x /day  as needed for Nerves                                          /         TAKE 1 TABLET BY MOUTH THREE TIMES DAILY Patient taking differently: Take 10 mg by mouth 3 (three) times daily as needed (nerves). 01/25/21   Unk Pinto, MD  Cholecalciferol (VITAMIN D3) 125 MCG (5000 UT) CAPS Take 5,000 Units by mouth daily.    [provider]  diclofenac Sodium (VOLTAREN) 1 % GEL Apply 2 g topically 4 (four) times daily. 06/29/21   Liane Comber, NP  diltiazem (CARDIZEM CD) 120 MG 24 hr capsule Take 1 capsule at Night  for BP & Prevent Migraines 04/27/21   Unk Pinto, MD  escitalopram (LEXAPRO) 10 MG tablet Take 1 tablet by mouth once daily 08/16/21   Alycia Rossetti, NP  ezetimibe (ZETIA) 10 MG tablet TAKE 1 TABLET BY MOUTH ONCE DAILY FOR CHOLESTEROL 08/16/21   Alycia Rossetti, NP  fluticasone (FLONASE) 50 MCG/ACT nasal spray Place 1 spray into both nostrils 2 (two) times daily. 03/09/21   [provider]  levETIRAcetam (KEPPRA) 500 MG tablet Take 500 mg by mouth 2 (two) times daily.    [provider]  Multiple Vitamins-Minerals (MULTIVITAMIN PO) Take 1 tablet by mouth daily.    [provider]  oxyCODONE-acetaminophen (PERCOCET/ROXICET) 5-325 MG tablet Take 1 tablet by mouth every 4 (four) hours as  needed for severe pain. 11/08/21   Tegeler, Gwenyth Allegra, MD  rosuvastatin (CRESTOR) 40 MG tablet Take 1 tablet (40 mg total) by mouth daily. for cholesterol. 06/08/21   Donato Heinz, MD  Family History Family History  Problem Relation Age of Onset   Diabetes Mother    Heart disease Mother    Heart disease Father    Stroke Father    Hypertension Father    Hyperlipidemia Sister    Hypertension Brother     Social History Social History   Tobacco Use   Smoking status: Former    Packs/day: 1.00    Years: 4.00    Total pack years: 4.00    Types: Cigarettes    Quit date: 03/25/1986    Years since quitting: 35.7   Smokeless tobacco: Never  Vaping Use   Vaping Use: Never used  Substance Use Topics   Alcohol use: Not Currently   Drug use: No     Allergies   Banana, Chocolate, Effexor [venlafaxine], Maxalt [rizatriptan benzoate], Other, Tussin [guaifenesin], and Zostavax [zoster vaccine live]   Review of Systems Review of Systems  Constitutional:  Positive for activity change. Negative for appetite change, fatigue and fever.  HENT:  Negative for sore throat, trouble swallowing and voice change.   Respiratory:  Negative for cough and shortness of breath.   Skin:  Positive for rash.  Neurological:  Negative for dizziness, light-headedness and headaches.     Physical Exam Triage Vital Signs ED Triage Vitals [12/14/21 1405]  Enc Vitals Group     BP 108/72     Pulse Rate (!) 107     Resp 16     Temp 98.2 F (36.8 C)     Temp Source Oral     SpO2 96 %     Weight      Height      Head Circumference      Peak Flow      Pain Score 0     Pain Loc      Pain Edu?      Excl. in [place]?    No data found.  Updated Vital Signs BP 108/72 (BP Location: Left Arm)   Pulse 96   Temp 98.2 F (36.8 C) (Oral)   Resp 16   SpO2 96%   Visual Acuity Right Eye Distance:   Left Eye Distance:   Bilateral Distance:    Right Eye Near:   Left Eye Near:    Bilateral  Near:     Physical Exam Vitals reviewed.  Constitutional:      General: She is awake. She is not in acute distress.    Appearance: Normal appearance. She is well-developed. She is not ill-appearing.     Comments: Very pleasant female appears stated age in no acute distress sitting comfortably in exam room  HENT:     Head: Normocephalic and atraumatic.     Mouth/Throat:     Pharynx: Uvula midline. No oropharyngeal exudate or posterior oropharyngeal erythema.  Cardiovascular:     Rate and Rhythm: Normal rate and regular rhythm.     Heart sounds: Normal heart sounds, S1 normal and S2 normal. No murmur heard. Pulmonary:     Effort: Pulmonary effort is normal.     Breath sounds: Normal breath sounds. No wheezing, rhonchi or rales.     Comments: Clear auscultation bilaterally Skin:    Findings: Rash present. Rash is urticarial.     Comments: Widespread urticarial rash with evidence of excoriation.  Psychiatric:        Behavior: Behavior is cooperative.      UC Treatments / Results  Labs (all labs ordered are listed, but only abnormal results are  displayed) Labs Reviewed - No data to display  EKG   Radiology No results found.  Procedures Procedures (including critical care time)  Medications Ordered in UC Medications - No data to display  Initial Impression / Assessment and Plan / UC Course  I have reviewed the triage vital signs and the nursing notes.  Pertinent labs & imaging results that were available during my care of the patient were reviewed by me and considered in my medical decision making (see chart for details).     Patient is well-appearing, afebrile, nontoxic, nontachycardic.  She denies any concerning symptoms for anaphylaxis and has been symptomatic for 3 days.  Discussed likely allergic etiology though it is unclear what her trigger was.  We will start prednisone and she was instructed not to take NSAIDs with this medication.  She was started on loratadine  for additional symptom relief.  Recommend she use hypoallergenic soaps and detergents and wear loosefitting cotton clothing.  She was prescribed Kenalog ointment to be used up to twice a day on specific bothersome lesions but discussed that she should not use this over large areas.  If she has any worsening symptoms including shortness of breath, spread of rash, fever, nausea, vomiting, weakness she needs to be seen immediately.  Strict return precautions given to which she expressed understanding.  Final Clinical Impressions(s) / UC Diagnoses   Final diagnoses:  Allergic urticaria     Discharge Instructions      Start prednisone taper.  Do not take NSAIDs including aspirin, ibuprofen/Advil, naproxen/Aleve with this medication.  Take loratadine at night.  Use triamcinolone cream twice a day on specific bothersome lesions.  You should not use this on a large portion of your skin.  Use hypoallergenic soaps and detergents.  Wear loosefitting cotton clothing.  If your symptoms are not improving quickly please return for reevaluation.  If you have any worsening symptoms including spread of rash, shortness of breath, swelling of your throat, fever, nausea, vomiting, difficulty speaking, difficulty swallowing you need to go to the emergency room.     ED Prescriptions     Medication Sig Dispense Auth. Provider   predniSONE (STERAPRED UNI-PAK 21 TAB) 10 MG (21) TBPK tablet As directed 21 tablet Cash Meadow K, PA-C   triamcinolone cream (KENALOG) 0.1 % Apply 1 Application topically 2 (two) times daily. 30 g Wassim Kirksey K, PA-C   Loratadine 5 MG TBDP Take 1 tablet (5 mg total) by mouth daily. 30 tablet Kareen Hitsman, Derry Skill, PA-C      PDMP not reviewed this encounter.   Terrilee Croak, PA-C 12/14/21 1430

## 2021-12-14 NOTE — Discharge Instructions (Signed)
Start prednisone taper.  Do not take NSAIDs including aspirin, ibuprofen/Advil, naproxen/Aleve with this medication.  Take loratadine at night.  Use triamcinolone cream twice a day on specific bothersome lesions.  You should not use this on a large portion of your skin.  Use hypoallergenic soaps and detergents.  Wear loosefitting cotton clothing.  If your symptoms are not improving quickly please return for reevaluation.  If you have any worsening symptoms including spread of rash, shortness of breath, swelling of your throat, fever, nausea, vomiting, difficulty speaking, difficulty swallowing you need to go to the emergency room.

## 2021-12-15 DIAGNOSIS — M25642 Stiffness of left hand, not elsewhere classified: Secondary | ICD-10-CM | POA: Diagnosis not present

## 2021-12-22 DIAGNOSIS — M25642 Stiffness of left hand, not elsewhere classified: Secondary | ICD-10-CM | POA: Diagnosis not present

## 2021-12-23 DIAGNOSIS — M25642 Stiffness of left hand, not elsewhere classified: Secondary | ICD-10-CM | POA: Diagnosis not present

## 2021-12-26 ENCOUNTER — Ambulatory Visit: Payer: Medicare PPO

## 2021-12-28 DIAGNOSIS — M25641 Stiffness of right hand, not elsewhere classified: Secondary | ICD-10-CM | POA: Diagnosis not present

## 2022-01-03 DIAGNOSIS — M25642 Stiffness of left hand, not elsewhere classified: Secondary | ICD-10-CM | POA: Diagnosis not present

## 2022-01-05 DIAGNOSIS — Z4789 Encounter for other orthopedic aftercare: Secondary | ICD-10-CM | POA: Diagnosis not present

## 2022-01-05 DIAGNOSIS — S52602D Unspecified fracture of lower end of left ulna, subsequent encounter for closed fracture with routine healing: Secondary | ICD-10-CM | POA: Diagnosis not present

## 2022-01-05 DIAGNOSIS — S62321B Displaced fracture of shaft of second metacarpal bone, left hand, initial encounter for open fracture: Secondary | ICD-10-CM | POA: Diagnosis not present

## 2022-01-05 DIAGNOSIS — M25642 Stiffness of left hand, not elsewhere classified: Secondary | ICD-10-CM | POA: Diagnosis not present

## 2022-01-05 DIAGNOSIS — S62327B Displaced fracture of shaft of fifth metacarpal bone, left hand, initial encounter for open fracture: Secondary | ICD-10-CM | POA: Diagnosis not present

## 2022-01-09 ENCOUNTER — Ambulatory Visit (INDEPENDENT_AMBULATORY_CARE_PROVIDER_SITE_OTHER): Payer: Medicare PPO | Admitting: Nurse Practitioner

## 2022-01-09 ENCOUNTER — Encounter: Payer: Self-pay | Admitting: Nurse Practitioner

## 2022-01-09 ENCOUNTER — Telehealth: Payer: Self-pay | Admitting: Nurse Practitioner

## 2022-01-09 VITALS — BP 118/80 | HR 88 | Temp 97.5°F | Ht 65.5 in | Wt 135.0 lb

## 2022-01-09 DIAGNOSIS — R4701 Aphasia: Secondary | ICD-10-CM

## 2022-01-09 DIAGNOSIS — J3089 Other allergic rhinitis: Secondary | ICD-10-CM | POA: Diagnosis not present

## 2022-01-09 DIAGNOSIS — K219 Gastro-esophageal reflux disease without esophagitis: Secondary | ICD-10-CM | POA: Diagnosis not present

## 2022-01-09 DIAGNOSIS — K5903 Drug induced constipation: Secondary | ICD-10-CM | POA: Diagnosis not present

## 2022-01-09 DIAGNOSIS — F411 Generalized anxiety disorder: Secondary | ICD-10-CM | POA: Diagnosis not present

## 2022-01-09 DIAGNOSIS — Z8673 Personal history of transient ischemic attack (TIA), and cerebral infarction without residual deficits: Secondary | ICD-10-CM

## 2022-01-09 DIAGNOSIS — G43001 Migraine without aura, not intractable, with status migrainosus: Secondary | ICD-10-CM

## 2022-01-09 DIAGNOSIS — E785 Hyperlipidemia, unspecified: Secondary | ICD-10-CM | POA: Diagnosis not present

## 2022-01-09 DIAGNOSIS — Z79899 Other long term (current) drug therapy: Secondary | ICD-10-CM

## 2022-01-09 DIAGNOSIS — I10 Essential (primary) hypertension: Secondary | ICD-10-CM

## 2022-01-09 DIAGNOSIS — Z87828 Personal history of other (healed) physical injury and trauma: Secondary | ICD-10-CM

## 2022-01-09 NOTE — Patient Instructions (Signed)

## 2022-01-09 NOTE — Progress Notes (Signed)
FOLLOW UP  Assessment:   Deborah [name] was seen today for follow-up and medicare wellness.  Diagnoses and all orders for this visit:  Essential hypertension Discussed DASH (Dietary Approaches to Stop Hypertension) DASH diet is lower in sodium than a typical American diet. Cut back on foods that are high in saturated fat, cholesterol, and trans fats. Eat more whole-grain foods, fish, poultry, and nuts Remain active and exercise as tolerated daily.  Monitor BP at home-Call if greater than 130/80.  Check CMP/CBC   Hyperlipidemia, mixed Continue rosuvastatin and zetia  Discussed lifestyle modifications. Recommended diet heavy in fruits and veggies, omega 3's. Decrease consumption of animal meats, cheeses, and dairy products. Remain active and exercise as tolerated. Continue to monitor. Check lipids/TSH  Gastroesophageal reflux disease, esophagitis presence not specified No suspected reflux complications ([place]/stricture). Lifestyle modification:  wt loss, avoid meals 2-3h before bedtime. Consider eliminating food triggers:  chocolate, caffeine, EtOH, acid/spicy food.  Migraine without aura and without status migrainosus, not intractable Change Tylenol to Advil PRN Recently controlled; Avoid triggers. Stay well hydrated.  Generalized anxiety disorder Continue current meds, doing well limiting benzo Reviewed relaxation techniques.  Sleep hygiene. Recommended Cognitive Behavioral Therapy (CBT). Recommended mindfulness meditation and exercise.   Discussed development through coping techniques and problem-solving skills. Limit/Decrease/Monitor drug/alcohol intake.    Constipation Discussed OTC Miralax daily or every other day Increase fiber in diet Increase water intake Walk/increase activity   History of TIA/Aphasia Control blood pressure, lipids and glucose Disscused lifestyle modifications, diet & exercise Continue to monitor Continue statin and ASA per neuro   Allergic  Rhinitis/Hives Zyrtec samples provided. Continue Hydrocortisone Avoid triggers.  Medication Management Continued  Multiple trauma/fractures/Dog bite (10/2021) Following with Ortho, Deborah [name] PT x2 days a week  Orders Placed This Encounter  Procedures   CBC with Differential/Platelet   COMPLETE METABOLIC PANEL WITH GFR   Lipid panel   Over 20 minutes of face to face exam, counseling, chart review and critical decision making was performed  Future Appointments  Date Time Provider [place]  04/19/2022 10:30 AM Deborah Pinto, MD [facility] None  07/24/2022 10:30 AM Deborah Jump, NP [facility] None  10/25/2022 10:00 AM Deborah Pinto, MD [facility] None    Plan:   During the course of the visit the patient was educated and counseled about appropriate screening and preventive services including:   Pneumococcal vaccine  Prevnar 13 Influenza vaccine Td vaccine Screening electrocardiogram Bone densitometry screening Colorectal cancer screening Diabetes screening Glaucoma screening Nutrition counseling  Advanced directives: requested   Subjective:  STEPHANI [name] is a 76 y.o. female who presents for Medicare Annual Wellness Visit and 3 month follow up. She has Hypertension; Hyperlipidemia; Vitamin D deficiency; Abnormal glucose; Medication management; History of skin cancer; Migraine; Generalized anxiety disorder; BMI 22.0-22.9, adult; History of TIA (transient ischemic attack); Aortic atherosclerosis ([place]) by Abd CT scan  11/2016; Compression fracture of T12 vertebra (H[facility]); GERD (gastroesophageal reflux disease); Osteopenia; and Primary osteoarthritis of left knee on their problem list.   Overall she reports feeling well today.  She was recently seen in the ED for hives.  She is now treating with Triamcinolone cream, PRN.  She does not take a daily antihistamine. Rahs comes and goes.   She is followed by Orthopedics Deborah [name] for multiple fractures from  trying to break up a dog fight with a dog bite to the left hand and arm on 11/08/21.  She is continuing PT x2 week. Left pointer finger continues to have moderate swelling,  but overall looks very well healed.  She is continuing to have LROM with decreased dexterity, however, able to do more now with the left hand than before.  She is right hand dominant.   In 03/2021 had for word finding difficulties, hx of similar in 2018; she had CT/CTA that were benign, MRI showed severe chronic small vessel ischemic disease and innumerable chronic cerebral micro hemorrhages suggestive of cerebral amyloid angiopathy. ECHO was normal. EEG was suggestive of cortical dysfunction from bil temporal region (non-specific etiology). Negative for seizure or epileptiform discharges. Neuro recommended to continue bASA, did not recommend DAPT.    She has ongoing anxiety, reports doing fairly with lexapro 10 mg, buspar 10 mg TID PRN, rare limited alprazolam 1/2 tab (0.25 mg) max 2 times a week.   She had hospital follow up with Dr. Melford [name] and per their discussion she has been back on diltiazem for hx of recurrent migraine. Keppra was also restarted at that time, 500 mg BID (had previously been on 2018-2020 due to similar episode of unclear etiology and seizures were questioned at that time). She is continuing to take [place].  No longer on Verapamil.  She has taken Tylenol in the past without much improvement.  She ha not tried NSAID.   BMI is Body mass index is 22.12 kg/m., she has been working on diet exercises limited by knee arthritis, declined surgery. Using tylenol. Has some back pain at night due to hx of T12 compression fracture, manages with heat.  Wt Readings from Last 3 Encounters:  01/09/22 135 lb (61.2 kg)  10/06/21 140 lb 9.6 oz (63.8 kg)  06/29/21 142 lb (64.4 kg)     Her blood pressure has been controlled at home, today their BP is BP: 118/80 She does workout. She denies chest pain, shortness of breath,  dizziness.   She is on cholesterol medication (rosuvastatin 40 mg daily, zetia 10 mg daily) and denies myalgias. Her cholesterol is at goal. The cholesterol last visit was:   Lab Results  Component Value Date   CHOL 138 10/06/2021   HDL 57 10/06/2021   LDLCALC 61 10/06/2021   TRIG 118 10/06/2021   CHOLHDL 2.4 10/06/2021   . She has been working on diet and exercise for abnormal glucose and denies increased appetite, paresthesia of the feet, polydipsia, polyuria, visual disturbances and vomiting. Last A1C in the office was:  Lab Results  Component Value Date   HGBA1C 5.6 10/06/2021   Last GFR: Lab Results  Component Value Date   EGFR 76 10/06/2021   Patient is on Vitamin D supplement for defciency (18 / 2008) Lab Results  Component Value Date   VD25OH 97 10/06/2021      Medication Review: Current Outpatient Medications on File Prior to Visit  Medication Sig Dispense Refill   alendronate (FOSAMAX) 70 MG tablet Take      1 tablet      every 7 days       with a full glass of water       on an empty stomach       for 60 minutes 13 tablet 0   ALPRAZolam (XANAX) 0.5 MG tablet TAKE 1/2 TO 1 TABLET BY MOUTH 2 TIMES A DAILY ONLY IF NEEDED FOR ANXIETY ATTACK AND LIMIT TO 5 DAYS A WEEK TO AVOID ADDICTION 60 tablet 0   aspirin EC 81 MG tablet Take 1 tablet (81 mg total) by mouth daily. 30 tablet 11   busPIRone (BUSPAR) 10 MG tablet  Take  1 tablet  3 x /day  as needed for Nerves                                          /         TAKE 1 TABLET BY MOUTH THREE TIMES DAILY (Patient taking differently: Take 10 mg by mouth 3 (three) times daily as needed (nerves).) 270 tablet 0   Cholecalciferol (VITAMIN D3) 125 MCG (5000 UT) CAPS Take 5,000 Units by mouth daily.     diclofenac Sodium (VOLTAREN) 1 % GEL Apply 2 g topically 4 (four) times daily. 150 g 1   diltiazem (CARDIZEM CD) 120 MG 24 hr capsule Take 1 capsule at Night  for BP & Prevent Migraines 90 capsule 3   escitalopram (LEXAPRO) 10 MG tablet  Take 1 tablet by mouth once daily 90 tablet 0   ezetimibe (ZETIA) 10 MG tablet TAKE 1 TABLET BY MOUTH ONCE DAILY FOR CHOLESTEROL 90 tablet 0   fluticasone (FLONASE) 50 MCG/ACT nasal spray Place 1 spray into both nostrils 2 (two) times daily.     levETIRAcetam (KEPPRA) 500 MG tablet Take 500 mg by mouth 2 (two) times daily.     Loratadine 5 MG TBDP Take 1 tablet (5 mg total) by mouth daily. 30 tablet 0   Multiple Vitamins-Minerals (MULTIVITAMIN PO) Take 1 tablet by mouth daily.     rosuvastatin (CRESTOR) 40 MG tablet Take 1 tablet (40 mg total) by mouth daily. for cholesterol. 90 tablet 3   triamcinolone cream (KENALOG) 0.1 % Apply 1 Application topically 2 (two) times daily. 30 g 0   oxyCODONE-acetaminophen (PERCOCET/ROXICET) 5-325 MG tablet Take 1 tablet by mouth every 4 (four) hours as needed for severe pain. (Patient not taking: Reported on 01/09/2022) 21 tablet 0   predniSONE (STERAPRED UNI-PAK 21 TAB) 10 MG (21) TBPK tablet As directed (Patient not taking: Reported on 01/09/2022) 21 tablet 0   No current facility-administered medications on file prior to visit.    Allergies  Allergen Reactions   Banana Nausea And Vomiting   Chocolate Other (See Comments)    Unknown   Effexor [Venlafaxine] Other (See Comments)    insomnia   Maxalt [Rizatriptan Benzoate] Nausea Only and Other (See Comments)    Nausea and excessive salivation   Other Other (See Comments)    NUTS (all kinds)   Tussin [Guaifenesin] Rash   Zostavax [Zoster Vaccine Live] Itching and Rash    Current Problems (verified) Patient Active Problem List   Diagnosis Date Noted   Primary osteoarthritis of left knee 03/22/2018   GERD (gastroesophageal reflux disease) 02/25/2018   Osteopenia 02/25/2018   Compression fracture of T12 vertebra ([place]) 05/30/2017   Aortic atherosclerosis ([place]) by Abd CT scan  11/2016 02/03/2017   History of TIA (transient ischemic attack) 12/15/2016   BMI 22.0-22.9, adult 03/12/2015   History of  skin cancer 08/06/2014   Migraine 08/06/2014   Generalized anxiety disorder 08/06/2014   Abnormal glucose 07/01/2013   Medication management 07/01/2013   Hypertension    Hyperlipidemia    Vitamin D deficiency     Screening Tests Immunization History  Administered Date(s) Administered   Influenza, High Dose Seasonal PF 01/01/2014, 03/15/2015, 12/13/2015, 01/03/2017, 02/05/2018, 01/30/2020, 02/22/2021   Influenza-Unspecified 12/26/2012   PFIZER Comirnaty(Gray Top)Covid-19 Tri-Sucrose Vaccine 09/24/2020   PFIZER(Purple Top)SARS-COV-2 Vaccination 05/15/2019, 06/12/2019, 01/18/2020   Pneumococcal Conjugate-13 08/06/2014  Pneumococcal-Unspecified 12/26/2012   Td 12/26/2012   Tdap 05/08/2017, 11/08/2021   Zoster, Live 12/08/2010   Health Maintenance  Topic Date Due   Pneumonia Vaccine 20+ Years old (2 - PPSV23 or PCV20) 08/06/2015   COVID-19 Vaccine (5 - Pfizer risk series) 11/19/2020   INFLUENZA VACCINE  11/15/2021   COLONOSCOPY (Pts 45-76yr Insurance coverage will need to be confirmed)  10/29/2022   TETANUS/TDAP  11/09/2031   DEXA SCAN  Completed   Hepatitis C Screening  Completed   HPV VACCINES  Aged Out   Zoster Vaccines- Shingrix  Discontinued    Names of Other Physician/Practitioners you currently use: 1. [facility] here for primary care   Patient Care Team: MUnk Pinto MD as PCP - General (Internal Medicine) SDonato Heinz MD as PCP - Cardiology (Cardiology) MRichmond Campbell MD as Consulting Physician (Gastroenterology) BShirley MuscatSLoreen Freud MD as Referring Physician (Optometry) GBerenice Primas MD as Referring Physician (Orthopedic Surgery) AJanann August MD as Referring Physician (Dermatology)  SURGICAL HISTORY She  has a past surgical history that includes Spine surgery (2010); Mohs surgery (Right, 04/2013); Knee arthroscopy (Bilateral, 04/2014); Appendectomy (1959); Cesarean section (1978); Cataract  extraction, bilateral (Bilateral, 2014); Colonoscopy w/ polypectomy (08/02/2017); Upper gi endoscopy (10/16/2016); and Total knee arthroplasty (Left, 03/22/2018). FAMILY HISTORY Her family history includes Diabetes in her mother; Heart disease in her father and mother; Hyperlipidemia in her sister; Hypertension in her brother and father; Stroke in her father. SOCIAL HISTORY She  reports that she quit smoking about 35 years ago. Her smoking use included cigarettes. She has a 4.00 pack-year smoking history. She has never used smokeless tobacco. She reports that she does not currently use alcohol. She reports that she does not use drugs.   Review of Systems  Constitutional:  Negative for malaise/fatigue and weight loss.  HENT:  Negative for hearing loss and tinnitus.   Eyes:  Negative for blurred vision and double vision.  Respiratory:  Negative for cough, sputum production, shortness of breath and wheezing.   Cardiovascular:  Negative for chest pain, palpitations, orthopnea, claudication, leg swelling and PND.  Gastrointestinal:  Negative for abdominal pain, blood in stool, constipation, diarrhea, heartburn, melena, nausea and vomiting.  Genitourinary: Negative.   Musculoskeletal:  Positive for back pain (at night, chronic, hx of fracture) and joint pain (R knee). Negative for falls and myalgias.  Skin:  Negative for rash.  Neurological:  Negative for dizziness, tingling, sensory change, seizures, weakness and headaches.       Mild word finding difficulties  Endo/Heme/Allergies:  Negative for polydipsia.  Psychiatric/Behavioral:  Negative for depression, memory loss, substance abuse and suicidal ideas. The patient is nervous/anxious (improved). The patient does not have insomnia.   All other systems reviewed and are negative.    Objective:     Today's Vitals   01/09/22 1051  BP: 118/80  Pulse: 88  Temp: (!) 97.5 F (36.4 C)  SpO2: 96%  Weight: 135 lb (61.2 kg)  Height: 5' 5.5" (1.664  m)   Body mass index is 22.12 kg/m.  General appearance: alert, no distress, WD/WN, female HEENT: normocephalic, sclerae anicteric, TMs pearly, nares patent, no discharge or erythema, pharynx normal Oral cavity: MMM, no lesions Neck: supple, no lymphadenopathy, no thyromegaly, no masses Heart: RRR, normal S1, S2, no murmurs Lungs: CTA bilaterally, no wheezes, rhonchi, or rales Abdomen: +bs, soft, non tender, non distended, no masses, no hepatomegaly, no splenomegaly Musculoskeletal: nontender, no swelling, no obvious deformity Extremities: no edema, no cyanosis,  no clubbing Pulses: 2+ symmetric, upper and lower extremities, normal cap refill Neurological: alert, oriented x 3, CN2-12 intact, strength normal upper extremities and lower extremities, sensation normal throughout, DTRs 2+ throughout, no cerebellar signs, gait normal Psychiatric: normal affect, behavior normal, pleasant   Deborah Jump, NP 11:42 AM [facility]

## 2022-01-09 NOTE — Telephone Encounter (Signed)
Not any samples available of Zyrtec. She was advised to buy the generic brand of it at the pharmacy

## 2022-01-09 NOTE — Telephone Encounter (Signed)
PT SAW TONYA TODAY AND SHE SAID THAT TONYA WAS GOIN TO CALL SOMETHING IN FOR HIVES AND PT SAID SHE REALLY NEED IT-PHARM IS [facility] ON [address]

## 2022-01-10 DIAGNOSIS — M25642 Stiffness of left hand, not elsewhere classified: Secondary | ICD-10-CM | POA: Diagnosis not present

## 2022-01-10 LAB — LIPID PANEL
Cholesterol: 149 mg/dL (ref <200)
HDL: 68 mg/dL (ref >=50)
LDL Cholesterol (Calc): 61 mg/dL (calc)
Non-HDL Cholesterol (Calc): 81 mg/dL (calc) (ref <130)
Total CHOL/HDL Ratio: 2.2 (calc) (ref <5.0)
Triglycerides: 121 mg/dL (ref <150)

## 2022-01-10 LAB — CBC WITH DIFFERENTIAL/PLATELET
Absolute Monocytes: 897 cells/uL (ref 200–950)
Basophils Absolute: 38 cells/uL (ref 0–200)
Basophils Relative: 0.5 %
Eosinophils Absolute: 99 cells/uL (ref 15–500)
Eosinophils Relative: 1.3 %
HCT: 36.9 % (ref 35.0–45.0)
Hemoglobin: 12.2 g/dL (ref 11.7–15.5)
Lymphs Abs: 2181 cells/uL (ref 850–3900)
MCH: 29.8 pg (ref 27.0–33.0)
MCHC: 33.1 g/dL (ref 32.0–36.0)
MCV: 90.2 fL (ref 80.0–100.0)
MPV: 9.2 fL (ref 7.5–12.5)
Monocytes Relative: 11.8 %
Neutro Abs: 4385 cells/uL (ref 1500–7800)
Neutrophils Relative %: 57.7 %
Platelets: 344 10*3/uL (ref 140–400)
RBC: 4.09 10*6/uL (ref 3.80–5.10)
RDW: 11.8 % (ref 11.0–15.0)
Total Lymphocyte: 28.7 %
WBC: 7.6 10*3/uL (ref 3.8–10.8)

## 2022-01-10 LAB — COMPLETE METABOLIC PANEL WITH GFR
AG Ratio: 1.5 (calc) (ref 1.0–2.5)
ALT: 18 U/L (ref 6–29)
AST: 28 U/L (ref 10–35)
Albumin: 4.1 g/dL (ref 3.6–5.1)
Alkaline phosphatase (APISO): 61 U/L (ref 37–153)
BUN: 17 mg/dL (ref 7–25)
CO2: 28 mmol/L (ref 20–32)
Calcium: 9.6 mg/dL (ref 8.6–10.4)
Chloride: 100 mmol/L (ref 98–110)
Creat: 0.73 mg/dL (ref 0.60–1.00)
Globulin: 2.7 g/dL (calc) (ref 1.9–3.7)
Glucose, Bld: 89 mg/dL (ref 65–99)
Potassium: 4.4 mmol/L (ref 3.5–5.3)
Sodium: 137 mmol/L (ref 135–146)
Total Bilirubin: 0.5 mg/dL (ref 0.2–1.2)
Total Protein: 6.8 g/dL (ref 6.1–8.1)
eGFR: 86 mL/min/{1.73_m2} (ref >=60)

## 2022-01-11 DIAGNOSIS — M25642 Stiffness of left hand, not elsewhere classified: Secondary | ICD-10-CM | POA: Diagnosis not present

## 2022-01-17 ENCOUNTER — Other Ambulatory Visit: Payer: Self-pay | Admitting: Nurse Practitioner

## 2022-01-17 DIAGNOSIS — M25642 Stiffness of left hand, not elsewhere classified: Secondary | ICD-10-CM | POA: Diagnosis not present

## 2022-01-17 DIAGNOSIS — F411 Generalized anxiety disorder: Secondary | ICD-10-CM

## 2022-01-19 DIAGNOSIS — M25642 Stiffness of left hand, not elsewhere classified: Secondary | ICD-10-CM | POA: Diagnosis not present

## 2022-01-24 DIAGNOSIS — M25642 Stiffness of left hand, not elsewhere classified: Secondary | ICD-10-CM | POA: Diagnosis not present

## 2022-01-26 DIAGNOSIS — M25642 Stiffness of left hand, not elsewhere classified: Secondary | ICD-10-CM | POA: Diagnosis not present

## 2022-01-30 NOTE — Progress Notes (Unsigned)
Future Appointments  Date Time Provider Department  01/31/2022 11:30 AM Unk Pinto, MD [facility]  04/19/2022 10:30 AM Unk Pinto, MD [facility]  07/24/2022 10:30 AM Darrol Jump, NP [facility]  10/25/2022 10:00 AM Unk Pinto, MD [facility]    History of Present Illness:      This very nice 76 y.o. MWF with HTN, HLD, Prediabetes  and Vitamin D Deficiency presents with c/o approx 6 week hx/o migratory hives of trunk & extremities. He denies awareness of any new detergents, shampoos, body products, meds, supplements, clothes, foods, pets or any other new exposures.   Medications  Current Outpatient Medications (Endocrine & Metabolic):    dexamethasone (DECADRON) 2 MG tablet, Take 1 tablet  3 x /day - 5 days, then 2 x /day - 5 days, then 1 tab daily for 5 days, then 1 tablet every other day   alendronate (FOSAMAX) 70 MG tablet, Take      1 tablet      every 7 days       with a full glass of water       on an empty stomach       for 60 minutes  Current Outpatient Medications (Cardiovascular):    diltiazem (CARDIZEM CD) 120 MG 24 hr capsule, Take 1 capsule at Night  for BP & Prevent Migraines   ezetimibe (ZETIA) 10 MG tablet, TAKE 1 TABLET BY MOUTH ONCE DAILY FOR CHOLESTEROL   rosuvastatin (CRESTOR) 40 MG tablet, Take 1 tablet (40 mg total) by mouth daily. for cholesterol.  Current Outpatient Medications (Respiratory):    fluticasone (FLONASE) 50 MCG/ACT nasal spray, Place 1 spray into both nostrils 2 (two) times daily.  Current Outpatient Medications (Analgesics):    aspirin EC 81 MG tablet, Take 1 tablet (81 mg total) by mouth daily.   Current Outpatient Medications (Other):    hydrOXYzine (ATARAX) 25 MG tablet, Take  1 tablet  3 x /day  with Meals  for Anxiety, Panic Attacks & Hives   ALPRAZolam (XANAX) 0.5 MG tablet, TAKE 1/2 TO 1 TABLET BY MOUTH 2 TIMES A DAILY ONLY IF NEEDED FOR ANXIETY ATTACK AND LIMIT TO 5 DAYS A WEEK TO AVOID ADDICTION   busPIRone (BUSPAR)  10 MG tablet, Take  1 tablet  3 x /day  as needed for Nerves                                          /         TAKE 1 TABLET BY MOUTH THREE TIMES DAILY (Patient taking differently: Take 10 mg by mouth 3 (three) times daily as needed (nerves).)   Cholecalciferol (VITAMIN D3) 125 MCG (5000 UT) CAPS, Take 5,000 Units by mouth daily.   diclofenac Sodium (VOLTAREN) 1 % GEL, Apply 2 g topically 4 (four) times daily.   escitalopram (LEXAPRO) 10 MG tablet, Take 1 tablet by mouth once daily   levETIRAcetam (KEPPRA) 500 MG tablet, Take 500 mg by mouth 2 (two) times daily.   Multiple Vitamins-Minerals (MULTIVITAMIN PO), Take 1 tablet by mouth daily.   triamcinolone cream (KENALOG) 0.1 %, Apply 1 Application topically 2 (two) times daily.  Problem list She has Hypertension; Hyperlipidemia; Vitamin D deficiency; Abnormal glucose; Medication management; History of skin cancer; Migraine; Generalized anxiety disorder; BMI 22.0-22.9, adult; History of TIA (transient ischemic attack); Aortic atherosclerosis ([place]) by Abd CT scan  11/2016; Compression  fracture of T12 vertebra ([place]); GERD (gastroesophageal reflux disease); Osteopenia; and Primary osteoarthritis of left knee on their problem list.   Observations/Objective:  BP (!) 140/80   Pulse (!) 112   Temp 97.9 F (36.6 C)   Resp 16   Ht 5' 5.5" (1.664 m)   Wt 134 lb 12.8 oz (61.1 kg)   SpO2 98%   BMI 22.09 kg/m   HEENT - WNL. Neck - supple.  Chest - Clear equal BS. Cor - Nl HS. RRR w/o sig MGR. PP 1(+). No edema. MS- FROM w/o deformities.  Gait Nl. Neuro -  Nl w/o focal abnormalities. Skin few scattered hives of forearms.   Assessment and Plan:  1. Urticaria  - Advised d/c Loratadine & start Zyrtec /Cetirizine 10 mg qhs  -  Slow / Low steroid taper - >>  dexamethasone  2 MG tablet;  Take 1 tablet  3 x /day - 5 days, then 2 x /day - 5 days,                 then 1 tab daily for 5 days,                              then 1 tablet every other  day   Dispense: 40 tablet; Refill: 0  - hydrOXYzine  25 MG tablet;  Take  1 tablet  3 x /day  with Meals  for  Anxiety, Panic Attacks & Hives   Dispense: 90 tablet; Refill: 2   Follow Up Instructions:        I discussed the assessment and treatment plan with the patient. The patient was provided an opportunity to ask questions and all were answered. The patient agreed with the plan and demonstrated an understanding of the instructions.       The patient was advised to call back or seek an in-person evaluation if the symptoms worsen or if the condition fails to improve as anticipated.    Kirtland Bouchard, MD

## 2022-01-31 ENCOUNTER — Ambulatory Visit (INDEPENDENT_AMBULATORY_CARE_PROVIDER_SITE_OTHER): Payer: Medicare PPO | Admitting: Internal Medicine

## 2022-01-31 ENCOUNTER — Encounter: Payer: Self-pay | Admitting: Internal Medicine

## 2022-01-31 VITALS — BP 140/80 | HR 112 | Temp 97.9°F | Resp 16 | Ht 65.5 in | Wt 134.8 lb

## 2022-01-31 DIAGNOSIS — M25642 Stiffness of left hand, not elsewhere classified: Secondary | ICD-10-CM | POA: Diagnosis not present

## 2022-01-31 DIAGNOSIS — L509 Urticaria, unspecified: Secondary | ICD-10-CM

## 2022-01-31 MED ORDER — DEXAMETHASONE 2 MG PO TABS: ORAL_TABLET | ORAL | 0 refills | Status: DC

## 2022-01-31 MED ORDER — HYDROXYZINE HCL 25 MG PO TABS: ORAL_TABLET | ORAL | 2 refills | Status: DC

## 2022-01-31 NOTE — Patient Instructions (Signed)
Hives Hives (urticaria) are itchy, red, swollen areas on the skin. Hives can appear on any part of the body. Hives often fade within 24 hours (acute hives). Sometimes, new hives appear after old ones fade and the cycle can continue for several days or weeks (chronic hives). Hives do not spread from person to person (are not contagious). Hives come from the body's reaction to something a person is allergic to (allergen), something that causes irritation, or various other triggers. When a person is exposed to a trigger, his or her body releases a chemical (histamine) that causes redness, itching, and swelling. Hives can appear right after exposure to a trigger or hours later. What are the causes? This condition may be caused by: Allergies to foods or ingredients. Insect bites or stings. Exposure to pollen or pets. Spending time in sunlight, heat, or cold (exposure). Exercise. Stress. You can also get hives from other medical conditions and treatments, such as: Viruses, including the common cold. Bacterial infections, such as urinary tract infections and strep throat. Certain medicines. Contact with latex or chemicals. Allergy shots. Blood transfusions. Sometimes, the cause of this condition is not known (idiopathic hives). What increases the risk? You are more likely to develop this condition if you: Are a woman. Have food allergies, especially to citrus fruits, milk, eggs, peanuts, tree nuts, or shellfish. Are allergic to: Medicines. Latex. Insects. Animals. Pollen. What are the signs or symptoms? Common symptoms of this condition include raised, itchy, red or white bumps or patches on your skin. These areas may: Become large and swollen (welts). Change in shape and location, quickly and repeatedly. Be separate hives or connect over a large area of skin. Sting or become painful. Turn white when pressed in the center (blanch). In severe cases, your hands, feet, and face may also  become swollen. This may occur if hives develop deeper in your skin. How is this diagnosed? This condition may be diagnosed by your symptoms, medical history, and physical exam. Your skin, urine, or blood may be tested to find out what is causing your hives and to rule out other health issues. Your health care provider may also remove a small sample of skin from the affected area and examine it under a microscope (biopsy). How is this treated? Treatment for this condition depends on the cause and severity of your symptoms. Your health care provider may recommend using cool, wet cloths (cool compresses) or taking cool showers to relieve itching. Treatment may include: Medicines that help: Relieve itching (antihistamines). Reduce swelling (corticosteroids). Treat infection (antibiotics). An injectable medicine (omalizumab). Your health care provider may prescribe this if you have chronic idiopathic hives and you continue to have symptoms even after treatment with antihistamines. Severe cases may require an emergency injection of adrenaline (epinephrine) to prevent a life-threatening allergic reaction (anaphylaxis). Follow these instructions at home: Medicines Take and apply over-the-counter and prescription medicines only as told by your health care provider. If you were prescribed an antibiotic medicine, take it as told by your health care provider. Do not stop using the antibiotic even if you start to feel better. Skin care Apply cool compresses to the affected areas. Do not scratch or rub your skin. General instructions Do not take hot showers or baths. This can make itching worse. Do not wear tight-fitting clothing. Use sunscreen and wear protective clothing when you are outside. Avoid any substances that cause your hives. Keep a journal to help track what causes your hives. Write down: What medicines you take.   What you eat and drink. What products you use on your skin. Keep all  follow-up visits as told by your health care provider. This is important. Contact a health care provider if: Your symptoms are not controlled with medicine. Your joints are painful or swollen. Get help right away if: You have a fever. You have pain in your abdomen. Your tongue or lips are swollen. Your eyelids are swollen. Your chest or throat feels tight. You have trouble breathing or swallowing. These symptoms may represent a serious problem that is an emergency. Do not wait to see if the symptoms will go away. Get medical help right away. Call your local emergency services (911 in the [place] Do not drive yourself to the hospital. Summary Hives (urticaria) are itchy, red, swollen areas on your skin. Hives come from the body's reaction to something a person is allergic to (allergen), something that causes irritation, or various other triggers. Treatment for this condition depends on the cause and severity of your symptoms. Avoid any substances that cause your hives. Keep a journal to help track what causes your hives. Take and apply over-the-counter and prescription medicines only as told by your health care provider. Get help right away if your chest or throat feels tight or if you have trouble breathing or swallowing. This information is not intended to replace advice given to you by your health care provider. Make sure you discuss any questions you have with your health care provider. Document Revised: 08/17/2021 Document Reviewed: 05/23/2020 Elsevier Patient Education  2023 [employer] Inc.  

## 2022-02-02 DIAGNOSIS — S62321B Displaced fracture of shaft of second metacarpal bone, left hand, initial encounter for open fracture: Secondary | ICD-10-CM | POA: Diagnosis not present

## 2022-02-02 DIAGNOSIS — M25642 Stiffness of left hand, not elsewhere classified: Secondary | ICD-10-CM | POA: Diagnosis not present

## 2022-02-02 DIAGNOSIS — Z4789 Encounter for other orthopedic aftercare: Secondary | ICD-10-CM | POA: Diagnosis not present

## 2022-02-02 DIAGNOSIS — S52602D Unspecified fracture of lower end of left ulna, subsequent encounter for closed fracture with routine healing: Secondary | ICD-10-CM | POA: Diagnosis not present

## 2022-02-02 DIAGNOSIS — S62327B Displaced fracture of shaft of fifth metacarpal bone, left hand, initial encounter for open fracture: Secondary | ICD-10-CM | POA: Diagnosis not present

## 2022-02-02 DIAGNOSIS — S62619D Displaced fracture of proximal phalanx of unspecified finger, subsequent encounter for fracture with routine healing: Secondary | ICD-10-CM | POA: Diagnosis not present

## 2022-02-07 DIAGNOSIS — M25642 Stiffness of left hand, not elsewhere classified: Secondary | ICD-10-CM | POA: Diagnosis not present

## 2022-02-09 ENCOUNTER — Other Ambulatory Visit: Payer: Self-pay

## 2022-02-09 ENCOUNTER — Ambulatory Visit: Payer: Medicare PPO | Admitting: Internal Medicine

## 2022-02-09 VITALS — BP 118/70 | HR 77 | Temp 98.6°F | Resp 17 | Ht 65.5 in | Wt 140.2 lb

## 2022-02-09 DIAGNOSIS — B3789 Other sites of candidiasis: Secondary | ICD-10-CM | POA: Diagnosis not present

## 2022-02-09 DIAGNOSIS — G4483 Primary cough headache: Secondary | ICD-10-CM

## 2022-02-09 DIAGNOSIS — J069 Acute upper respiratory infection, unspecified: Secondary | ICD-10-CM | POA: Diagnosis not present

## 2022-02-09 DIAGNOSIS — J029 Acute pharyngitis, unspecified: Secondary | ICD-10-CM

## 2022-02-09 LAB — POCT RAPID STREP A (OFFICE): Rapid Strep A Screen: NEGATIVE

## 2022-02-09 LAB — POC COVID19 BINAXNOW: SARS Coronavirus 2 Ag: NEGATIVE

## 2022-02-09 MED ORDER — DEXAMETHASONE 2 MG PO TABS: ORAL_TABLET | ORAL | 1 refills | Status: DC

## 2022-02-09 MED ORDER — NYSTATIN 100000 UNIT/ML MT SUSP: OROMUCOSAL | 3 refills | Status: DC

## 2022-02-09 MED ORDER — FLUCONAZOLE 100 MG PO TABS: ORAL_TABLET | ORAL | 1 refills | Status: DC

## 2022-02-09 NOTE — Progress Notes (Addendum)
<><><><><><><><><><><><><><><><><><><><><><><><><><><><><><><><><> <><><><><><><><><><><><><><><><><><><><><><><><><><><><><><><><><>  -   Negative Covid & Strep tests  <><><><><><><><><><><><><><><><><><><><><><><><><><><><><><><><><> <><><><><><><><><><><><><><><><><><><><><><><><><><><><><><><><><>

## 2022-02-11 ENCOUNTER — Encounter: Payer: Self-pay | Admitting: Internal Medicine

## 2022-02-11 NOTE — Progress Notes (Signed)
Future Appointments  Date Time Provider Department  04/19/2022 10:30 AM Unk Pinto, MD [facility]  07/24/2022 10:30 AM Darrol Jump, NP [facility]  10/25/2022 10:00 AM Unk Pinto, MD [facility]   History of Present Illness:     This very nice 76 y.o. MWF with HTN, HLD, Prediabetes  and Vitamin D Deficiency presents with c/o sore throat.  Admits mild  head/sinus congestion and no PND, fever , chills, or dysphagia.  Admits occasional dry cough. Covid & Strep tests today are negative.    Medications    alendronate (FOSAMAX) 70 MG tablet, Take   1 tablet  every 7 days       diltiazem (CARDIZEM CD) 120 MG 24 hr capsule, Take 1 capsule at Night  f   ezetimibe (ZETIA) 10 MG tablet, TAKE 1 TABLET DAILY    rosuvastatin (CRESTOR) 40 MG tablet, Take 1 tablet  daily. for cholesterol.   FLONASE nasal spray, Place 1 spray into both nostrils 2  times daily.   aspirin EC 81 MG tablet, Take 1 tablet  daily.   ALPRAZolam  0.5 MG tablet, TAKE 1/2 TO 1 TABLET 2 x/day ONLY IF NEEDED    busPIRone (BUSPAR) 10 MG tablet, Take  1 tablet  3 x /day  as needed    VITAMIN D 5000  CAPS, Take  daily.   diclofenac  1 % GEL, Apply 2 g  4  times daily.   escitalopram 10 MG tablet, Take 1 tablet  daily   hydrOXYzine 25 MG tablet, Take  1 tablet  3 x /day  with Meals  for Anxiety, Panic Attacks & Hives   KEPPRA 500 MG tablet, Take  2 times daily.   Multiple Vitamins-Minerals , Take 1 tablet  daily.   triamcinolone cream 0.1 %, Apply  2 times daily.  Problem list She has Hypertension; Hyperlipidemia; Vitamin D deficiency; Abnormal glucose; Medication management; History of skin cancer; Migraine; Generalized anxiety disorder; BMI 22.0-22.9, adult; History of TIA (transient ischemic attack); Aortic atherosclerosis ([place]) by Abd CT scan  11/2016; Compression fracture of T12 vertebra ([facility]); GERD (gastroesophageal reflux disease); Osteopenia; and Primary osteoarthritis of left knee on their problem list.    Observations/Objective:  BP 118/70   Pulse 77   Temp 98.6 F (37 C)   Resp 17   Ht 5' 5.5" (1.664 m)   Wt 140 lb 3.2 oz (63.6 kg)   SpO2 96%   BMI 22.98 kg/m   HEENT - EACs patent & TMs Nl. N/clear.  O/P finds diffuse white exudates of hard & soft palates. Tonsillar areas clear.  Neck - supple. Nl T . No sig. Adenopathy.  Chest - Clear equal BS. Cor - Nl HS. RRR w/o sig MGR. PP 1(+). No edema. MS- FROM w/o deformities.  Gait Nl. Neuro -  Nl w/o focal abnormalities.  Assessment and Plan:  1. URI, acute  - dexamethasone (DECADRON) 2 MG tablet; Take 1 tab 3 x /day for 2 days,      then 2 x /day for 2  Days,     then 1 tab daily  Dispense: 13 tablet; Refill: 1  2. Pharyngeal candidiasis  - fluconazole (DIFLUCAN) 100 MG tablet; Take  1 tablet  Daily  for oral Yeast Infection  Dispense: 21 tablet; Refill: 1 - magic mouthwash (nystatin, hydrocortisone, diphenhydrAMINE) suspension; Take  1 teaspoon (5 ml)   Swish & Swallow  every 2 hours for mouth ulcers  Dispense: 240 mL; Refill: 3  3. Cough  headache  - POC COVID-19  4. Sore throat  - POCT rapid strep A   Follow Up Instructions:      I discussed the assessment and treatment plan with the patient. The patient was provided an opportunity to ask questions and all were answered. The patient agreed with the plan and demonstrated an understanding of the instructions.       The patient was advised to call back or seek an in-person evaluation if the symptoms worsen or if the condition fails to improve as anticipated.   Kirtland Bouchard, MD

## 2022-02-14 DIAGNOSIS — M25642 Stiffness of left hand, not elsewhere classified: Secondary | ICD-10-CM | POA: Diagnosis not present

## 2022-02-16 DIAGNOSIS — M25642 Stiffness of left hand, not elsewhere classified: Secondary | ICD-10-CM | POA: Diagnosis not present

## 2022-02-21 DIAGNOSIS — R197 Diarrhea, unspecified: Secondary | ICD-10-CM | POA: Diagnosis not present

## 2022-02-21 DIAGNOSIS — R112 Nausea with vomiting, unspecified: Secondary | ICD-10-CM | POA: Diagnosis not present

## 2022-02-28 DIAGNOSIS — M25642 Stiffness of left hand, not elsewhere classified: Secondary | ICD-10-CM | POA: Diagnosis not present

## 2022-03-02 DIAGNOSIS — S52602D Unspecified fracture of lower end of left ulna, subsequent encounter for closed fracture with routine healing: Secondary | ICD-10-CM | POA: Diagnosis not present

## 2022-03-02 DIAGNOSIS — M25642 Stiffness of left hand, not elsewhere classified: Secondary | ICD-10-CM | POA: Diagnosis not present

## 2022-03-02 DIAGNOSIS — S62327B Displaced fracture of shaft of fifth metacarpal bone, left hand, initial encounter for open fracture: Secondary | ICD-10-CM | POA: Diagnosis not present

## 2022-03-02 DIAGNOSIS — Z4789 Encounter for other orthopedic aftercare: Secondary | ICD-10-CM | POA: Diagnosis not present

## 2022-03-02 DIAGNOSIS — S62321B Displaced fracture of shaft of second metacarpal bone, left hand, initial encounter for open fracture: Secondary | ICD-10-CM | POA: Diagnosis not present

## 2022-03-02 DIAGNOSIS — S62619D Displaced fracture of proximal phalanx of unspecified finger, subsequent encounter for fracture with routine healing: Secondary | ICD-10-CM | POA: Diagnosis not present

## 2022-03-08 DIAGNOSIS — M25642 Stiffness of left hand, not elsewhere classified: Secondary | ICD-10-CM | POA: Diagnosis not present

## 2022-03-15 DIAGNOSIS — M25642 Stiffness of left hand, not elsewhere classified: Secondary | ICD-10-CM | POA: Diagnosis not present

## 2022-03-20 DIAGNOSIS — M25642 Stiffness of left hand, not elsewhere classified: Secondary | ICD-10-CM | POA: Diagnosis not present

## 2022-03-22 ENCOUNTER — Other Ambulatory Visit: Payer: Self-pay | Admitting: Nurse Practitioner

## 2022-03-22 DIAGNOSIS — E782 Mixed hyperlipidemia: Secondary | ICD-10-CM

## 2022-03-28 DIAGNOSIS — M25642 Stiffness of left hand, not elsewhere classified: Secondary | ICD-10-CM | POA: Diagnosis not present

## 2022-03-30 DIAGNOSIS — S62321B Displaced fracture of shaft of second metacarpal bone, left hand, initial encounter for open fracture: Secondary | ICD-10-CM | POA: Diagnosis not present

## 2022-03-30 DIAGNOSIS — S62327B Displaced fracture of shaft of fifth metacarpal bone, left hand, initial encounter for open fracture: Secondary | ICD-10-CM | POA: Diagnosis not present

## 2022-03-30 DIAGNOSIS — S52602D Unspecified fracture of lower end of left ulna, subsequent encounter for closed fracture with routine healing: Secondary | ICD-10-CM | POA: Diagnosis not present

## 2022-03-30 DIAGNOSIS — Z4789 Encounter for other orthopedic aftercare: Secondary | ICD-10-CM | POA: Diagnosis not present

## 2022-03-30 DIAGNOSIS — S62619D Displaced fracture of proximal phalanx of unspecified finger, subsequent encounter for fracture with routine healing: Secondary | ICD-10-CM | POA: Diagnosis not present

## 2022-04-05 ENCOUNTER — Other Ambulatory Visit: Payer: Self-pay | Admitting: Internal Medicine

## 2022-04-05 DIAGNOSIS — M8000XD Age-related osteoporosis with current pathological fracture, unspecified site, subsequent encounter for fracture with routine healing: Secondary | ICD-10-CM

## 2022-04-18 NOTE — Patient Instructions (Signed)

## 2022-04-18 NOTE — Progress Notes (Unsigned)
Future Appointments  Date Time Provider Department  04/19/2022                            6 mo ov 10:30 AM Unk Pinto, MD [facility]  07/24/2022                             wellness 10:30 AM Darrol Jump, NP [facility]  10/25/2022                            cpe 10:00 AM Unk Pinto, MD [facility]    History of Present Illness:       This very nice 77 y.o. MWF presents for 6  month follow up with HTN, HLD, Pre-Diabetes and Vitamin D Deficiency. Abd CT scan in 2018 showed Aortic Atherosclerosis.        In 2018, patient was hospitalized with confusion, initially dx'd as atypical seizure vs confusional migraine. Initially she was treated with Topamax, then subsequently  with Keppra .       Patient is treated for HTN  (2010)  & BP has been controlled at home. Today's  . Patient has had no complaints of any cardiac type chest pain, palpitations, dyspnea / orthopnea / PND, dizziness, claudication, or dependent edema.       Hyperlipidemia is controlled with diet & meds. Patient denies myalgias or other med SE's. Last Lipids were at goal :  Lab Results  Component Value Date   CHOL 149 01/09/2022   HDL 68 01/09/2022   LDLCALC 61 01/09/2022   TRIG 121 01/09/2022   CHOLHDL 2.2 01/09/2022     Also, the patient has history of PreDiabetes (A1c 6.1% /2011) and has had no symptoms of reactive hypoglycemia, diabetic polys, paresthesias or visual blurring.  Last A1c was normal & at goal :  Lab Results  Component Value Date   HGBA1C 5.6 10/06/2021        Further, the patient also has history of Vitamin D Deficiency  ("18" /2008) and supplements vitamin D . Last vitamin D was at goal :  Lab Results  Component Value Date   VD25OH 97 10/06/2021     Current Outpatient Medications on File Prior to Visit  Medication Sig   alendronate (FOSAMAX) 70 MG tablet Take      1 tablet      every 7 days       with a full glass of water       on an empty stomach       for 60 minutes    ALPRAZolam (XANAX) 0.5 MG tablet TAKE 1/2 TO 1 TABLET 2 x/day  ONLY IF NEEDED    aspirin EC 81 MG tablet Take 1 tablet  daily.   busPIRone (BUSPAR) 10 MG tablet Take  1 tablet  3 x /day  as needed for Nerves       VITAMIN D    5000 u Take 5,000 Units  daily.   diclofenac 1 % GEL Apply 2 g topically 4 (four) times daily.   diltiazem (CARDIZEM CD) 120 MG 24 hr capsule Take 1 capsule at Night  for BP & Prevent Migraines   escitalopram (LEXAPRO) 10 MG tablet Take 1 tablet by mouth once daily   ezetimibe (ZETIA) 10 MG tablet Take  1 tablet  Daily   fluconazole (DIFLUCAN) 100 MG tablet  Take  1 tablet  Daily  for oral Yeast Infection   FLONASE  nasal spray Place 1 spray into both nostrils 2 (two) times daily.   hydrOXYzine (ATARAX) 25 MG tablet Take  1 tablet  3 x /day     levETIRAcetam (KEPPRA) 500 MG tablet Take 500 mg by mouth 2 (two) times daily.   magic mouthwash (nystatin, hydrocortisone, diphenhydrAMINE) suspension Take  1 teaspoon (5 ml)   Swish & Swallow  every 2 hours for mouth ulcers   Multiple Vitamins-Minerals  Take 1 tablet by mouth daily.   rosuvastatin (CRESTOR) 40 MG tablet Take 1 tablet (40 mg total) by mouth daily. for cholesterol.   triamcinolone cream (KENALOG) 0.1 % Apply 1 Application topically 2 (two) times daily.    Allergies  Allergen Reactions   Banana Nausea And Vomiting   Chocolate Other (See Comments)    Unknown   Effexor [Venlafaxine] Other (See Comments)    insomnia   Maxalt [Rizatriptan Benzoate] Nausea Only and Other (See Comments)    Nausea and excessive salivation   Other Other (See Comments)    NUTS (all kinds)   Tussin [Guaifenesin] Rash   Zostavax [Zoster Vaccine Live] Itching and Rash     PMHx:   Past Medical History:  Diagnosis Date   Allergy    Anemia    Anxiety    Aortic atherosclerosis ([place]) 11/2016   Noted on CT abd   Arthritis    hands and knees   Bilateral cataracts    History of   Cellulitis    Right finger   Cerebral amyloid  angiopathy ([facility])    Cervical spondylosis 12/2008   Noted on Cervical spine   Chronic nausea    Compression fracture of T12 vertebra ([place]) 06/07/2017   Mild   DDD (degenerative disc disease), cervical 12/2008   Noted on Cervical spine   Delusions ([place]) 03/24/2018   Depression    Encephalopathy 01/02/2017   Mild, [place] [facility]   GERD (gastroesophageal reflux disease)    Helicobacter pylori infection 2018   History of appendicitis    age 57   History of colon polyps    History of confusion    History of hiatal hernia 10/16/2016   Noted on Endoscopy   Hyperlipidemia    Incontinence in female 08/06/2014   Internal hemorrhoid 08/02/2017   Noted on Colonoscopy   Low blood pressure reading    Migraines    Osteopenia    PONV (postoperative nausea and vomiting)    Pre-diabetes    RLS (restless legs syndrome)    Seizure ([place])    atypical partial comples vs. confusional migraines   Seizure disorder ([place]) 12/15/2016   Skin cancer    right lower eyelid   Squamous cell skin cancer    history of neck and upper back   Syncope 03/25/2018   TIA (transient ischemic attack)    questionable per patient   Vitamin D deficiency      Immunization History  Administered Date(s) Administered   Influenza, High Dose  02/05/2018, 01/30/2020, 02/22/2021   Influenza 12/26/2012   PFIZER Covid-19  Vacc 09/24/2020   PFIZER-SARS-COV-2 Vacci 05/15/2019, 06/12/2019, 01/18/2020   Pneumococcal - 13 08/06/2014   Pneumococcal- 23 12/26/2012   Td 12/26/2012   Tdap 05/08/2017, 11/08/2021   Zoster, Live 12/08/2010     Past Surgical History:  Procedure Laterality Date   APPENDECTOMY  1959   CATARACT EXTRACTION, BILATERAL Bilateral 2014   [facility]  COLONOSCOPY W/ POLYPECTOMY  08/02/2017   KNEE ARTHROSCOPY Bilateral 04/2014   [facility]   MOHS SURGERY Right 04/2013   Squamous cell cacinoma, neck/upper back   SPINE SURGERY  2010   C6-C7 fusion   TOTAL KNEE ARTHROPLASTY Left  03/22/2018   Procedure: LEFT TOTAL KNEE ARTHROPLASTY;  Surgeon: Dorna Leitz, MD;  Location: [facility];  Service: Orthopedics;  Laterality: Left;  Adductor Block   UPPER GI ENDOSCOPY  10/16/2016    FHx:    Reviewed / unchanged  SHx:    Reviewed / unchanged   Systems Review:  Constitutional: Denies fever, chills, wt changes, headaches, insomnia, fatigue, night sweats, change in appetite. Eyes: Denies redness, blurred vision, diplopia, discharge, itchy, watery eyes.  ENT: Denies discharge, congestion, post nasal drip, epistaxis, sore throat, earache, hearing loss, dental pain, tinnitus, vertigo, sinus pain, snoring.  CV: Denies chest pain, palpitations, irregular heartbeat, syncope, dyspnea, diaphoresis, orthopnea, PND, claudication or edema. Respiratory: denies cough, dyspnea, DOE, pleurisy, hoarseness, laryngitis, wheezing.  Gastrointestinal: Denies dysphagia, odynophagia, heartburn, reflux, water brash, abdominal pain or cramps, nausea, vomiting, bloating, diarrhea, constipation, hematemesis, melena, hematochezia  or hemorrhoids. Genitourinary: Denies dysuria, frequency, urgency, nocturia, hesitancy, discharge, hematuria or flank pain. Musculoskeletal: Denies arthralgias, myalgias, stiffness, jt. swelling, pain, limping or strain/sprain.  Skin: Denies pruritus, rash, hives, warts, acne, eczema or change in skin lesion(s). Neuro: No weakness, tremor, incoordination, spasms, paresthesia or pain. Psychiatric: Denies confusion, memory loss or sensory loss. Endo: Denies change in weight, skin or hair change.  Heme/Lymph: No excessive bleeding, bruising or enlarged lymph nodes.  Physical Exam  There were no vitals taken for this visit.  Appears  well nourished, well groomed  and in no distress.  Eyes: PERRLA, EOMs, conjunctiva no swelling or erythema. Sinuses: No frontal/maxillary tenderness ENT/Mouth: EAC's clear, TM's nl w/o erythema, bulging. Nares clear w/o erythema, swelling, exudates.  Oropharynx clear without erythema or exudates. Oral hygiene is good. Tongue normal, non obstructing. Hearing intact.  Neck: Supple. Thyroid not palpable. Car 2+/2+ without bruits, nodes or JVD. Chest: Respirations nl with BS clear & equal w/o rales, rhonchi, wheezing or stridor.  Cor: Heart sounds normal w/ regular rate and rhythm without sig. murmurs, gallops, clicks or rubs. Peripheral pulses normal and equal  without edema.  Abdomen: Soft & bowel sounds normal. Non-tender w/o guarding, rebound, hernias, masses or organomegaly.  Lymphatics: Unremarkable.  Musculoskeletal: Full ROM all peripheral extremities, joint stability, 5/5 strength and normal gait.  Skin: Warm, dry without exposed rashes, lesions or ecchymosis apparent.  Neuro: Cranial nerves intact, reflexes equal bilaterally. Sensory-motor testing grossly intact. Tendon reflexes grossly intact.  Pysch: Alert & oriented x 3.  Insight and judgement nl & appropriate. No ideations.  Assessment and Plan:  - Continue medication, monitor blood pressure at home.  - Continue DASH diet.  Reminder to go to the ER if any CP,  SOB, nausea, dizziness, severe HA, changes vision/speech.  - Continue diet/meds, exercise,& lifestyle modifications.  - Continue monitor periodic cholesterol/liver & renal functions    - Continue diet, exercise  - Lifestyle modifications.  - Monitor appropriate labs. - Continue supplementation.        Discussed  regular exercise, BP monitoring, weight control to achieve/maintain BMI less than 25 and discussed med and SE's. Recommended labs to assess /monitor clinical status .  I discussed the assessment and treatment plan with the patient. The patient was provided an opportunity to ask questions and all were answered. The patient agreed with the plan and demonstrated  an understanding of the instructions.  I provided over 30 minutes of exam, counseling, chart review and  complex critical decision making.        The  patient was advised to call back or seek an in-person evaluation if the symptoms worsen or if the condition fails to improve as anticipated.   Kirtland Bouchard, MD

## 2022-04-19 ENCOUNTER — Ambulatory Visit: Payer: Medicare PPO | Admitting: Internal Medicine

## 2022-04-19 ENCOUNTER — Encounter: Payer: Self-pay | Admitting: Internal Medicine

## 2022-04-19 VITALS — BP 108/70 | HR 90 | Temp 97.9°F | Resp 17 | Ht 65.5 in | Wt 133.8 lb

## 2022-04-19 DIAGNOSIS — Z79899 Other long term (current) drug therapy: Secondary | ICD-10-CM | POA: Diagnosis not present

## 2022-04-19 DIAGNOSIS — E782 Mixed hyperlipidemia: Secondary | ICD-10-CM | POA: Diagnosis not present

## 2022-04-19 DIAGNOSIS — G40909 Epilepsy, unspecified, not intractable, without status epilepticus: Secondary | ICD-10-CM | POA: Diagnosis not present

## 2022-04-19 DIAGNOSIS — E559 Vitamin D deficiency, unspecified: Secondary | ICD-10-CM

## 2022-04-19 DIAGNOSIS — I700 Atherosclerosis of aorta: Secondary | ICD-10-CM | POA: Diagnosis not present

## 2022-04-19 DIAGNOSIS — I10 Essential (primary) hypertension: Secondary | ICD-10-CM

## 2022-04-19 DIAGNOSIS — F411 Generalized anxiety disorder: Secondary | ICD-10-CM

## 2022-04-19 DIAGNOSIS — M810 Age-related osteoporosis without current pathological fracture: Secondary | ICD-10-CM | POA: Diagnosis not present

## 2022-04-19 DIAGNOSIS — R7309 Other abnormal glucose: Secondary | ICD-10-CM | POA: Diagnosis not present

## 2022-04-19 MED ORDER — ALENDRONATE SODIUM 70 MG PO TABS: ORAL_TABLET | ORAL | 0 refills | Status: DC

## 2022-04-19 MED ORDER — BUSPIRONE HCL 10 MG PO TABS: ORAL_TABLET | ORAL | 3 refills | Status: DC

## 2022-04-20 LAB — HEMOGLOBIN A1C
Hgb A1c MFr Bld: 5.9 % of total Hgb — ABNORMAL HIGH (ref <5.7)
Mean Plasma Glucose: 123 mg/dL
eAG (mmol/L): 6.8 mmol/L

## 2022-04-20 LAB — COMPLETE METABOLIC PANEL WITH GFR
AG Ratio: 1.4 (calc) (ref 1.0–2.5)
ALT: 16 U/L (ref 6–29)
AST: 28 U/L (ref 10–35)
Albumin: 4.1 g/dL (ref 3.6–5.1)
Alkaline phosphatase (APISO): 66 U/L (ref 37–153)
BUN: 15 mg/dL (ref 7–25)
CO2: 27 mmol/L (ref 20–32)
Calcium: 9.6 mg/dL (ref 8.6–10.4)
Chloride: 100 mmol/L (ref 98–110)
Creat: 0.87 mg/dL (ref 0.60–1.00)
Globulin: 2.9 g/dL (calc) (ref 1.9–3.7)
Glucose, Bld: 94 mg/dL (ref 65–99)
Potassium: 4.3 mmol/L (ref 3.5–5.3)
Sodium: 135 mmol/L (ref 135–146)
Total Bilirubin: 0.6 mg/dL (ref 0.2–1.2)
Total Protein: 7 g/dL (ref 6.1–8.1)
eGFR: 69 mL/min/{1.73_m2} (ref >=60)

## 2022-04-20 LAB — MAGNESIUM: Magnesium: 2.3 mg/dL (ref 1.5–2.5)

## 2022-04-20 LAB — LIPID PANEL
Cholesterol: 148 mg/dL (ref <200)
HDL: 72 mg/dL (ref >=50)
LDL Cholesterol (Calc): 59 mg/dL (calc)
Non-HDL Cholesterol (Calc): 76 mg/dL (calc) (ref <130)
Total CHOL/HDL Ratio: 2.1 (calc) (ref <5.0)
Triglycerides: 90 mg/dL (ref <150)

## 2022-04-20 LAB — CBC WITH DIFFERENTIAL/PLATELET
Absolute Monocytes: 925 cells/uL (ref 200–950)
Basophils Absolute: 69 cells/uL (ref 0–200)
Basophils Relative: 1 %
Eosinophils Absolute: 186 cells/uL (ref 15–500)
Eosinophils Relative: 2.7 %
HCT: 37.2 % (ref 35.0–45.0)
Hemoglobin: 12.7 g/dL (ref 11.7–15.5)
Lymphs Abs: 1753 cells/uL (ref 850–3900)
MCH: 29.7 pg (ref 27.0–33.0)
MCHC: 34.1 g/dL (ref 32.0–36.0)
MCV: 86.9 fL (ref 80.0–100.0)
MPV: 9.3 fL (ref 7.5–12.5)
Monocytes Relative: 13.4 %
Neutro Abs: 3968 cells/uL (ref 1500–7800)
Neutrophils Relative %: 57.5 %
Platelets: 388 10*3/uL (ref 140–400)
RBC: 4.28 10*6/uL (ref 3.80–5.10)
RDW: 12.5 % (ref 11.0–15.0)
Total Lymphocyte: 25.4 %
WBC: 6.9 10*3/uL (ref 3.8–10.8)

## 2022-04-20 LAB — TEST AUTHORIZATION: TEST CODE:: 36330

## 2022-04-20 LAB — TSH: TSH: 4.33 mIU/L (ref 0.40–4.50)

## 2022-04-20 LAB — INSULIN, RANDOM: Insulin: 9.3 u[IU]/mL

## 2022-04-20 LAB — VITAMIN D 25 HYDROXY (VIT D DEFICIENCY, FRACTURES): Vit D, 25-Hydroxy: 91 ng/mL (ref 30–100)

## 2022-04-20 NOTE — Progress Notes (Signed)
<><><><><><><><><><><><><><><><><><><><><><><><><><><><><><><><><> <><><><><><><><><><><><><><><><><><><><><><><><><><><><><><><><><> - Test results slightly outside the reference range are not unusual. If there is anything important, I will review this with you,  otherwise it is considered normal test values.  If you have further questions,  please do not hesitate to contact me at the office or via My Chart.  <><><><><><><><><><><><><><><><><><><><><><><><><><><><><><><><><> <><><><><><><><><><><><><><><><><><><><><><><><><><><><><><><><><>  -  A1c  ( 12 week sugars ) up again &  elevated in the borderline and                                                                 early or pre-diabetes range which has the same   300% increased risk for heart attack,                                                      stroke,                                                          cancer &                                                                   alzheimer- type vascular dementia as                                                                                                              full blown diabetes.   But the good news is that diet, exercise with weight loss can                                                                                      cure the early diabetes at this point.   - It is very important that you work harder with diet by  avoiding all foods that are white except chicken, fish & calliflower.  - Avoid white rice  (brown & wild rice is OK),   - Avoid white potatoes  (sweet potatoes in moderation is OK),   White bread or wheat bread or anything made out of   white flour like bagels, donuts, rolls, buns, biscuits, cakes,  - pastries, cookies, pizza crust, and pasta (made from  white flour & egg whites)   - vegetarian pasta or spinach or wheat pasta is OK.  - Multigrain breads like Arnold's, Pepperidge Farm or    multigrain sandwich thins or high fiber breads like   [place] bread or "Dave[employer] Killer" breads that are  4 to 5 grams fiber per slice !  are best.    Diet, exercise and weight loss can reverse and cure diabetes in the early stages.    - Diet, exercise and weight loss is very important in the   control and prevention of complications of diabetes which                                                            affects every system in your body, ie.   -Brain - dementia/stroke,  - eyes - glaucoma/blindness,  - heart - heart attack/heart failure,  - kidneys - dialysis,  - stomach - gastric paralysis,  - intestines - malabsorption,  - nerves - severe painful neuritis,  - circulation - gangrene & loss of a leg(s)  - and finally  . . . . . . . . . . . . . . . . . .    - cancer and Alzheimers. <><><><><><><><><><><><><><><><><><><><><><><><><><><><><><><><><> <><><><><><><><><><><><><><><><><><><><><><><><><><><><><><><><><>  -  Total Chol= 148   &   LDL Chol = 59  - Both  Excellent   - Very low risk for Heart Attack  / Stroke <><><><><><><><><><><><><><><><><><><><><><><><><><><><><><><><><>  -  Vitamin = 91 - Excellent - Please keep dose same   <><><><><><><><><><><><><><><><><><><><><><><><><><><><><><><><><>  -  All Else - CBC - Kidneys - Electrolytes - Liver - Magnesium & Thyroid    - all  Normal / OK <><><><><><><><><><><><><><><><><><><><><><><><><><><><><><><><><>  -  Keep up the Great Work  !  <><><><><><><><><><><><><><><><><><><><><><><><><><><><><><><><><> <><><><><><><><><><><><><><><><><><><><><><><><><><><><><><><><><>

## 2022-04-23 ENCOUNTER — Other Ambulatory Visit: Payer: Self-pay | Admitting: Internal Medicine

## 2022-04-23 DIAGNOSIS — M810 Age-related osteoporosis without current pathological fracture: Secondary | ICD-10-CM

## 2022-04-24 DIAGNOSIS — M67844 Other specified disorders of tendon, left hand: Secondary | ICD-10-CM | POA: Diagnosis not present

## 2022-04-24 DIAGNOSIS — Z472 Encounter for removal of internal fixation device: Secondary | ICD-10-CM | POA: Diagnosis not present

## 2022-04-24 DIAGNOSIS — T8482XA Fibrosis due to internal orthopedic prosthetic devices, implants and grafts, initial encounter: Secondary | ICD-10-CM | POA: Diagnosis not present

## 2022-04-24 MED ORDER — ALENDRONATE SODIUM 70 MG PO TABS: ORAL_TABLET | ORAL | 0 refills | Status: DC

## 2022-04-28 DIAGNOSIS — Z4789 Encounter for other orthopedic aftercare: Secondary | ICD-10-CM | POA: Diagnosis not present

## 2022-04-28 DIAGNOSIS — S62321B Displaced fracture of shaft of second metacarpal bone, left hand, initial encounter for open fracture: Secondary | ICD-10-CM | POA: Diagnosis not present

## 2022-04-28 DIAGNOSIS — M25632 Stiffness of left wrist, not elsewhere classified: Secondary | ICD-10-CM | POA: Diagnosis not present

## 2022-04-28 DIAGNOSIS — M25642 Stiffness of left hand, not elsewhere classified: Secondary | ICD-10-CM | POA: Diagnosis not present

## 2022-05-04 DIAGNOSIS — M25642 Stiffness of left hand, not elsewhere classified: Secondary | ICD-10-CM | POA: Diagnosis not present

## 2022-05-16 DIAGNOSIS — M25642 Stiffness of left hand, not elsewhere classified: Secondary | ICD-10-CM | POA: Diagnosis not present

## 2022-05-18 DIAGNOSIS — M25642 Stiffness of left hand, not elsewhere classified: Secondary | ICD-10-CM | POA: Diagnosis not present

## 2022-05-23 DIAGNOSIS — M25642 Stiffness of left hand, not elsewhere classified: Secondary | ICD-10-CM | POA: Diagnosis not present

## 2022-05-25 DIAGNOSIS — M25642 Stiffness of left hand, not elsewhere classified: Secondary | ICD-10-CM | POA: Diagnosis not present

## 2022-05-25 DIAGNOSIS — S62611D Displaced fracture of proximal phalanx of left index finger, subsequent encounter for fracture with routine healing: Secondary | ICD-10-CM | POA: Diagnosis not present

## 2022-05-26 ENCOUNTER — Other Ambulatory Visit: Payer: Self-pay | Admitting: Nurse Practitioner

## 2022-05-26 DIAGNOSIS — F411 Generalized anxiety disorder: Secondary | ICD-10-CM

## 2022-06-07 ENCOUNTER — Ambulatory Visit
Admission: RE | Admit: 2022-06-07 | Discharge: 2022-06-07 | Disposition: A | Discharge status: Home / Self Care | Payer: Medicare PPO | Source: Ambulatory Visit | Attending: Internal Medicine | Admitting: Internal Medicine

## 2022-06-07 DIAGNOSIS — Z1231 Encounter for screening mammogram for malignant neoplasm of breast: Secondary | ICD-10-CM

## 2022-07-04 ENCOUNTER — Ambulatory Visit: Payer: Medicare PPO | Admitting: Nurse Practitioner

## 2022-07-05 ENCOUNTER — Other Ambulatory Visit: Payer: Self-pay | Admitting: Cardiology

## 2022-07-05 DIAGNOSIS — E782 Mixed hyperlipidemia: Secondary | ICD-10-CM

## 2022-07-24 ENCOUNTER — Ambulatory Visit: Payer: Medicare PPO | Admitting: Nurse Practitioner

## 2022-08-01 ENCOUNTER — Ambulatory Visit (INDEPENDENT_AMBULATORY_CARE_PROVIDER_SITE_OTHER): Payer: Medicare PPO | Admitting: Nurse Practitioner

## 2022-08-01 ENCOUNTER — Encounter: Payer: Self-pay | Admitting: Nurse Practitioner

## 2022-08-01 VITALS — BP 110/60 | HR 92 | Temp 97.9°F | Resp 16 | Ht 65.5 in | Wt 128.2 lb

## 2022-08-01 DIAGNOSIS — K5903 Drug induced constipation: Secondary | ICD-10-CM

## 2022-08-01 DIAGNOSIS — R7309 Other abnormal glucose: Secondary | ICD-10-CM

## 2022-08-01 DIAGNOSIS — K219 Gastro-esophageal reflux disease without esophagitis: Secondary | ICD-10-CM | POA: Diagnosis not present

## 2022-08-01 DIAGNOSIS — Z6822 Body mass index (BMI) 22.0-22.9, adult: Secondary | ICD-10-CM

## 2022-08-01 DIAGNOSIS — Z79899 Other long term (current) drug therapy: Secondary | ICD-10-CM

## 2022-08-01 DIAGNOSIS — F411 Generalized anxiety disorder: Secondary | ICD-10-CM | POA: Diagnosis not present

## 2022-08-01 DIAGNOSIS — M8000XD Age-related osteoporosis with current pathological fracture, unspecified site, subsequent encounter for fracture with routine healing: Secondary | ICD-10-CM

## 2022-08-01 DIAGNOSIS — Z0000 Encounter for general adult medical examination without abnormal findings: Secondary | ICD-10-CM

## 2022-08-01 DIAGNOSIS — G43001 Migraine without aura, not intractable, with status migrainosus: Secondary | ICD-10-CM

## 2022-08-01 DIAGNOSIS — I700 Atherosclerosis of aorta: Secondary | ICD-10-CM | POA: Diagnosis not present

## 2022-08-01 DIAGNOSIS — E559 Vitamin D deficiency, unspecified: Secondary | ICD-10-CM

## 2022-08-01 DIAGNOSIS — Z0001 Encounter for general adult medical examination with abnormal findings: Secondary | ICD-10-CM

## 2022-08-01 DIAGNOSIS — R6889 Other general symptoms and signs: Secondary | ICD-10-CM

## 2022-08-01 DIAGNOSIS — Z8673 Personal history of transient ischemic attack (TIA), and cerebral infarction without residual deficits: Secondary | ICD-10-CM

## 2022-08-01 DIAGNOSIS — E782 Mixed hyperlipidemia: Secondary | ICD-10-CM

## 2022-08-01 DIAGNOSIS — I10 Essential (primary) hypertension: Secondary | ICD-10-CM

## 2022-08-01 NOTE — Patient Instructions (Addendum)
Acetaminophen; Chlorpheniramine; Phenylephrine capsules or tablets What is this medication? ACETAMINOPHEN; CHLORPHENIRAMINE; PHENYLEPHRINE (a set a MEE noe fen; klor fen IR a meen; fen il EF rin) is a combination of an analgesic, antihistamine, and decongestant. It is used to treat the symptoms of allergy. This medicine may be used for other purposes; ask your health care provider or pharmacist if you have questions. COMMON BRAND NAME(S): Contac Cold + Flu, Dristan Multi-Symptom Cold, Dryphen, Norel AD, Robitussin Adult Peak Cold, Sine-Off Sinus and Cold, Tylenol Allergy Multi-Symptom, Tylenol Allergy Multi-Symptom Rapid Release, Tylenol Sinus Congestion and Pain Nighttime What should I tell my care team before I take this medication? They need to know if you have any of these conditions: blockage in your bowels diabetes (high blood sugar) glaucoma heart disease high blood pressure if you often drink alcohol kidney disease liver disease lung or breathing disease (asthma, COPD) prostate disease stomach ulcers, other stomach or intestine problems taken an MAOI such as Marplan, Nardil, or Parnate in last 14 days thyroid disease an unusual or allergic reaction to acetaminophen, chlorpheniramine, other medicines foods, dyes, or preservatives pregnant or trying to get pregnant breast-feeding How should I use this medication? Take this medicine by mouth with water. Take it as directed on the label. Do not use it more often than directed. Talk to your health care provider about the use of this medicine in children. While it may be given to children as young as 12 years for selected conditions, precautions do apply. Patients over 85 years of age may have a stronger reaction and need a smaller dose. Overdosage: If you think you have taken too much of this medicine contact a poison control center or emergency room at once. NOTE: This medicine is only for you. Do not share this medicine with  others. What if I miss a dose? This does not apply. This medicine is not for regular use. It should only be used as needed. What may interact with this medication? Do not take this medicine with any of the following medications: MAOIs like Marplan, Nardil, Parnate This medicine may also interact with the following medications: alcohol certain medicines for anxiety or sleep certain medicines for depression like amitriptyline, fluoxetine, sertraline certain medicines for seizures like phenobarbital, primidone certain medicines that treat or prevent blood clots like warfarin general anesthetics like halothane, isoflurane, methoxyflurane, propofol medicines that relax muscles for surgery narcotic medicines for pain other antihistamines for allergy, cough, and cold phenothiazines like chlorpromazine, mesoridazine, prochlorperazine, thioridazine This list may not describe all possible interactions. Give your health care provider a list of all the medicines, herbs, non-prescription drugs, or dietary supplements you use. Also tell them if you smoke, drink alcohol, or use illegal drugs. Some items may interact with your medicine. What should I watch for while using this medication? Visit your health care provider for regular checks on your progress. Tell your health care provider if your symptoms do not start to get better or if they get worse. If you need to use this medicine for more than 7 days, talk to your health care provider. Do not take other medicines that contain acetaminophen with this medicine. Many non-prescription medicines contain acetaminophen. Always read labels carefully. If you have questions, ask your health care provider. If you take too much acetaminophen, get medical help right away. Too much acetaminophen can be very dangerous and cause liver damage. Even if you do not have symptoms, it is important to get help right away. You may get drowsy  or dizzy. Do not drive, use machinery,  or do anything that needs mental alertness until you know how this medicine affects you. Do not stand or sit up quickly, especially if you are an older patient. This reduces the risk of dizzy or fainting spells. Alcohol may interfere with the effect of this medicine. Avoid alcoholic drinks. This medicine may cause serious skin reactions. They can happen weeks to months after starting the medicine. Contact your health care provider right away if you notice fevers or flu-like symptoms with a rash. The rash may be red or purple and then turn into blisters or peeling of the skin. Or, you might notice a red rash with swelling of the face, lips or lymph nodes in your neck or under your arms. Your mouth may get dry. Chewing sugarless gum or sucking hard candy and drinking plenty of water may help. Contact your health care provider if the problem does not go away or is severe. What side effects may I notice from receiving this medication? Side effects that you should report to your doctor or health care professional as soon as possible: allergic reactions (skin rash, itching or hives; swelling of the face, lips, or tongue) increase in blood pressure liver injury (dark yellow or brown urine; general ill feeling or flu-like symptoms; loss of appetite, right upper belly pain; unusually weak or tired, yellowing of the eyes or skin). light-colored stool redness, blistering, peeling, or loosening of the skin, including inside the mouth seizures trouble passing urine Side effects that usually do not require medical attention (report these to your doctor or health care professional if they continue or are bothersome): anxious dizziness dry mouth trouble sleeping unusually weak or tired This list may not describe all possible side effects. Call your doctor for medical advice about side effects. You may report side effects to FDA at 1-800-FDA-1088. Where should I keep my medication? Keep out of the reach of  children and pets. Store at [employer] [facility] (77 degrees F). Get rid of any unused medicine after the expiration date. To get rid of medicines that are no longer needed or have expired: Take the medicine to a medicine take-back program. Check with your pharmacy or law enforcement to find a location. If you cannot return the medicine, check the label or package insert to see if the medicine should be thrown out in the garbage or flushed down the toilet. If you are not sure, ask your health care provider. If it is safe to put it in the trash, take the medicine out of the container. Mix the medicine with cat litter, dirt, coffee grounds, or other unwanted substance. Seal the mixture in a bag or container. Put it in the trash. NOTE: This sheet is a summary. It may not cover all possible information. If you have questions about this medicine, talk to your doctor, pharmacist, or health care provider.  2023 Elsevier/Gold Standard (2019-11-26 00:00:00)  Allergies, Adult An allergy is when your body reacts to something that bothers it (allergen). Allergies often affect the nose, eyes, skin, and stomach. They cannot spread from person to person. Allergies can start at any age. Sometimes, they go away as you get older. What are the causes? Outdoor things, like pollen, car fumes, and mold. Indoor things, like dust, smoke, mold, and pets. Foods. Medicines. Things that bother your skin. These include perfumes and bug bites. What increases the risk? Having family members with allergies or asthma. What are the signs or symptoms? Allergies  may cause: A runny nose, stuffy nose, or sneezing. An itchy mouth, ears, or throat. A feeling of mucus dripping down the back of your throat. A sore throat. Eyes that itch or are red, watery, or puffy. A skin rash or red, swollen areas of skin (hives). Stomach cramps or bloating. If you have a very bad allergic reaction (anaphylactic reaction) to food, medicine, or bug  bites, you may also have: A red face. Coughing. Swollen lips, tongue, or mouth. A tight or swollen throat. Chest pain. Your chest may feel tight. Your heart may beat too fast. Trouble breathing. Pain in your belly (abdomen). You may vomit or have watery poop (diarrhea). Fainting. Or you may feel dizzy. If you have a very bad reaction to an allergy, you should get help right away. How is this treated?     You may need to use: Cold, wet cloths. These can help with itching and swelling. Eye drops, nose sprays, or skin creams. A saline solution to wash out your nose. Saline solutions are made of salt and water. A humidifier. Medicines. You may need to change the foods you eat. You may be given allergy shots, or a small bit of the allergen may be put under your tongue. These treatments can help your body get used to the allergen. If you have a very bad allergic reaction, you may need to use an auto-injector pen. An auto-injector pen is a device filled with medicine. It gives you an emergency shot of epinephrine. Your doctor will teach you how to use it. Follow these instructions at home: Medicines  Take or apply over-the-counter and prescription medicines only as told by your doctor. Keep an auto-injector pen with you all the time if you might have a very bad allergic reaction. Eating and drinking Follow instructions from your doctor about what you may eat and drink. Drink enough fluid to keep your pee (urine) pale yellow. General instructions If you have ever had a bad reaction, wear a medical alert bracelet or necklace. Stay away from the things you are allergic to. Keep all follow-up visits. Your doctor will check on how you are doing and talk about treatment options with you. Contact a doctor if: You do not get better with treatment. Get help right away if: You have a very bad allergic reaction. You use your auto-injector pen. You must go to the hospital even if the medicine  seems to be working. These symptoms may be an emergency. Use the auto-injector pen right away. Then call 911. Do not wait to see if the symptoms will go away. Do not drive yourself to the hospital. This information is not intended to replace advice given to you by your health care provider. Make sure you discuss any questions you have with your health care provider. Document Revised: 12/14/2021 Document Reviewed: 12/14/2021 Elsevier Patient Education  2023 [employer].

## 2022-08-01 NOTE — Progress Notes (Signed)
MEDICARE ANNUAL WELLNESS VISIT AND FOLLOW UP  Assessment:   Deborah [name] was seen today for follow-up and medicare wellness.  Diagnoses and all orders for this visit:  Annual Medicare Wellness Visit Due annually  Health maintenance reviewed  Essential hypertension Discussed DASH (Dietary Approaches to Stop Hypertension) DASH diet is lower in sodium than a typical American diet. Cut back on foods that are high in saturated fat, cholesterol, and trans fats. Eat more whole-grain foods, fish, poultry, and nuts Remain active and exercise as tolerated daily.  Monitor BP at home-Call if greater than 130/80.  Check CMP/CBC  Hyperlipidemia, mixed Discussed lifestyle modifications. Recommended diet heavy in fruits and veggies, omega 3's. Decrease consumption of animal meats, cheeses, and dairy products. Remain active and exercise as tolerated. Continue to monitor. Check lipids/TSH  Gastroesophageal reflux disease, esophagitis presence not specified No suspected reflux complications (Barret/stricture). Lifestyle modification:  wt loss, avoid meals 2-3h before bedtime. Consider eliminating food triggers:  chocolate, caffeine, EtOH, acid/spicy food.  Abnormal glucose Education: Reviewed 'ABCs' of diabetes management  Discussed goals to be met and/or maintained include A1C (<7) Blood pressure (<130/80) Cholesterol (LDL <70) Continue Eye Exam yearly  Continue Dental Exam Q6 mo Discussed dietary recommendations Discussed Physical Activity recommendations Check A1C  Vitamin D deficiency Continue supplement for goal of 60-100 Monitor Vitamin D levels  Aortic atherosclerosis (HCC) Per CT 03/25/18 Control blood pressure, lipids and glucose Disscused lifestyle modifications, diet & exercise Continue to monitor  Migraine without aura and without status migrainosus, not intractable Recently controlled; continue verapamil and avoiding triggers Stay well hydrated  Generalized anxiety  disorder Continue medications.   Reviewed relaxation techniques.  Sleep hygiene. Recommended Cognitive Behavioral Therapy (CBT). Recommended mindfulness meditation and exercise.   Encouraged personality growth wand development through coping techniques and problem-solving skills. Limit/Decrease/Monitor drug/alcohol intake.     Osteopenia of multiple sites DEXA Due Pursue a combination of weight-bearing exercises and strength training. Advised on fall prevention measures including proper lighting in all rooms, removal of area rugs and floor clutter, use of walking devices as deemed appropriate, avoidance of uneven walking surfaces. Smoking cessation and moderate alcohol consumption if applicable Consume 800 to 1000 IU of vitamin D daily with a goal vitamin D serum value of 30 ng/mL or higher. Aim for 1000 to 1200 mg of elemental calcium daily through supplements and/or dietary sources.  Constipation Discussed OTC Miralax daily or every other day Increase fiber in diet Increase water intake Remain active.  History of TIA Control blood pressure, lipids and glucose Disscused lifestyle modifications, diet & exercise Continue to monitor  Allergic Rhinitis Try OTC nasal saline gel Avoid triggers Suggest local honey  BMI 22, adult Discussed appropriate BMI Diet modification. Physical activity. Encouraged/praised to build confidence.  Medication Management All medications discussed and reviewed in full. All questions and concerns regarding medications addressed.    Review of lab work is WNL - re-check 3 mo.  Orders Placed This Encounter  Procedures   DG Bone Density    Standing Status:   Future    Standing Expiration Date:   08/01/2023    Order Specific Question:   Reason for Exam (SYMPTOM  OR DIAGNOSIS REQUIRED)    Answer:   Biannual screening, osteoporosis    Order Specific Question:   Preferred imaging location?    Answer:   [facility]     Notify office for  further evaluation and treatment, questions or concerns if any reported s/s fail to improve.   The patient was  advised to call back or seek an in-person evaluation if any symptoms worsen or if the condition fails to improve as anticipated.   Further disposition pending results of labs. Discussed med's effects and SE's.    I discussed the assessment and treatment plan with the patient. The patient was provided an opportunity to ask questions and all were answered. The patient agreed with the plan and demonstrated an understanding of the instructions.  Discussed med's effects and SE's. Screening labs and tests as requested with regular follow-up as recommended.  I provided 40 minutes of face-to-face time during this encounter including counseling, chart review, and critical decision making was preformed.  Today's Plan of Care is based on a patient-centered health care approach known as shared decision making - the decisions, tests and treatments allow for patient preferences and values to be balanced with clinical evidence.     Future Appointments  Date Time Provider Department Center  10/03/2022  9:00 AM [facility] DX DEXA 1 [facility]  11/01/2022 11:00 AM Lucky Cowboy, MD [facility] None    Plan:   During the course of the visit the patient was educated and counseled about appropriate screening and preventive services including:   Pneumococcal vaccine  Prevnar 13 Influenza vaccine Td vaccine Screening electrocardiogram Bone densitometry screening Colorectal cancer screening Diabetes screening Glaucoma screening Nutrition counseling  Advanced directives: requested   Subjective:  Deborah [name] is a 77 y.o. female who presents for Medicare Annual Wellness Visit and 3 month follow up. She has Hypertension; Hyperlipidemia; Vitamin D deficiency; Abnormal glucose; Medication management; History of skin cancer; Migraine; Generalized anxiety disorder; BMI 22.0-22.9, adult;  History of TIA (transient ischemic attack); Aortic atherosclerosis (HCC) by Abd CT scan  11/2016; Compression fracture of T12 vertebra; GERD (gastroesophageal reflux disease); Osteopenia; and Primary osteoarthritis of left knee on their problem list.  Overall she reports feeling well.  She has no new or concerning symptoms in clinic today.  She was admitted in 03/2021 for word finding difficulties, hx of similar in 2018; she had CT/CTA that were benign, MRI showed severe chronic small vessel ischemic disease and innumerable chronic cerebral micro hemorrhages suggestive of cerebral amyloid angiopathy. ECHO was normal. EEG was suggestive of cortical dysfunction from bil temporal region (non-specific etiology). Negative for seizure or epileptiform discharges. Neuro recommended to continue bASA, did not recommend DAPT.   She had hospital follow up with Dr. Oneta Rack and per their discussion she has been back on diltiazem for hx of recurrent migraine. Keppra was also restarted at that time, 500 mg BID (had previously been on 2018-2020 due to similar episode of unclear etiology and seizures were questioned at that time).   She has ongoing anxiety, reports doing fairly with lexapro 10 mg, buspar 10 mg TID PRN, rare limited alprazolam 1/2 tab (0.25 mg) max 2 times a week.   BMI is Body mass index is 21.01 kg/m., she has been working on diet exercises. Wt Readings from Last 3 Encounters:  08/01/22 128 lb 3.2 oz (58.2 kg)  04/19/22 133 lb 12.8 oz (60.7 kg)  02/09/22 140 lb 3.2 oz (63.6 kg)     Her blood pressure has been controlled at home, today their BP is BP: 110/60 She does workout. She denies chest pain, shortness of breath, dizziness.   She is on cholesterol medication (rosuvastatin 40 mg daily, zetia 10 mg daily) and denies myalgias. Her cholesterol is at goal. The cholesterol last visit was:   Lab Results  Component Value Date  CHOL 148 04/19/2022   HDL 72 04/19/2022   LDLCALC 59 04/19/2022    TRIG 90 04/19/2022   CHOLHDL 2.1 04/19/2022   . She has been working on diet and exercise for abnormal glucose and denies increased appetite, paresthesia of the feet, polydipsia, polyuria, visual disturbances and vomiting. Last A1C in the office was:  Lab Results  Component Value Date   HGBA1C 5.9 (H) 04/19/2022   Last GFR: Lab Results  Component Value Date   EGFR 69 04/19/2022   Patient is on Vitamin D supplement for defciency (18 / 2008) Lab Results  Component Value Date   VD25OH 91 04/19/2022      Medication Review: Current Outpatient Medications on File Prior to Visit  Medication Sig Dispense Refill   alendronate (FOSAMAX) 70 MG tablet Take      1 tablet      every 7 days       with a full glass of water       on an empty stomach       for 60 minutes 13 tablet 0   ALPRAZolam (XANAX) 0.5 MG tablet TAKE 1/2 TO 1 TABLET BY MOUTH 2 TIMES A DAILY ONLY IF NEEDED FOR ANXIETY ATTACK AND LIMIT TO 5 DAYS A WEEK TO AVOID ADDICTION 60 tablet 0   aspirin EC 81 MG tablet Take 1 tablet (81 mg total) by mouth daily. 30 tablet 11   busPIRone (BUSPAR) 10 MG tablet Take  1 tablet  3 x /day  as needed for Nerves                                                                      /                                                        TAKE                                               BY                                     MOUTH 270 tablet 3   Cholecalciferol (VITAMIN D3) 125 MCG (5000 UT) CAPS Take 5,000 Units by mouth daily.     diclofenac Sodium (VOLTAREN) 1 % GEL Apply 2 g topically 4 (four) times daily. 150 g 1   diltiazem (CARDIZEM CD) 120 MG 24 hr capsule Take 1 capsule at Night  for BP & Prevent Migraines 90 capsule 3   escitalopram (LEXAPRO) 10 MG tablet Take 1 tablet by mouth once daily 90 tablet 0   ezetimibe (ZETIA) 10 MG tablet Take  1 tablet  Daily for Cholesterol                                                                    /  TAKE                                      BY                                            MOUTH                                          ONCE DAILY 90 tablet 3   Multiple Vitamins-Minerals (MULTIVITAMIN PO) Take 1 tablet by mouth daily.     rosuvastatin (CRESTOR) 40 MG tablet TAKE 1 TABLET BY MOUTH ONCE DAILY FOR CHOLESTEROL 30 tablet 0   No current facility-administered medications on file prior to visit.    Allergies  Allergen Reactions   Banana Nausea And Vomiting   Chocolate Other (See Comments)    Unknown   Effexor [Venlafaxine] Other (See Comments)    insomnia   Maxalt [Rizatriptan Benzoate] Nausea Only and Other (See Comments)    Nausea and excessive salivation   Other Other (See Comments)    NUTS (all kinds)   Tussin [Guaifenesin] Rash   Zostavax [Zoster Vaccine Live] Itching and Rash    Current Problems (verified) Patient Active Problem List   Diagnosis Date Noted   Primary osteoarthritis of left knee 03/22/2018   GERD (gastroesophageal reflux disease) 02/25/2018   Osteopenia 02/25/2018   Compression fracture of T12 vertebra 05/30/2017   Aortic atherosclerosis (HCC) by Abd CT scan  11/2016 02/03/2017   History of TIA (transient ischemic attack) 12/15/2016   BMI 22.0-22.9, adult 03/12/2015   History of skin cancer 08/06/2014   Migraine 08/06/2014   Generalized anxiety disorder 08/06/2014   Abnormal glucose 07/01/2013   Medication management 07/01/2013   Hypertension    Hyperlipidemia    Vitamin D deficiency     Screening Tests Immunization History  Administered Date(s) Administered   Influenza, High Dose Seasonal PF 01/01/2014, 03/15/2015, 12/13/2015, 01/03/2017, 02/05/2018, 01/30/2020, 02/22/2021   Influenza-Unspecified 12/26/2012   PFIZER Comirnaty(Gray Top)Covid-19 Tri-Sucrose Vaccine 09/24/2020   PFIZER(Purple Top)SARS-COV-2 Vaccination 05/15/2019, 06/12/2019, 01/18/2020   Pneumococcal Conjugate-13 08/06/2014   Pneumococcal-Unspecified 12/26/2012   Td 12/26/2012   Tdap  05/08/2017, 11/08/2021   Zoster, Live 12/08/2010   Health Maintenance  Topic Date Due   Pneumonia Vaccine 36+ Years old (2 of 2 - PPSV23 or PCV20) 08/06/2015   COVID-19 Vaccine (5 - 2023-24 season) 12/16/2021   COLONOSCOPY (Pts 45-2yrs Insurance coverage will need to be confirmed)  10/29/2022   INFLUENZA VACCINE  11/16/2022   Medicare Annual Wellness (AWV)  08/01/2023   DTaP/Tdap/Td (4 - Td or Tdap) 11/09/2031   DEXA SCAN  Completed   Hepatitis C Screening  Completed   HPV VACCINES  Aged Out   Zoster Vaccines- Shingrix  Discontinued   Last colonoscopy: 10/2020 Due 3 Years  Last mammogram: 05/2022 DEXA: 01/2020, T score -3.1, on fosamax - Due  Shingles/Zostavax: 2012, had reaction, declines shingrix  Reports had pneumonia vaccine fall 2022, unsure of type and date, will verify   Names of Other Physician/Practitioners you currently use: 1. [facility] here for primary care 2. Eye Exam 2023 3. Dr. Garen Grams Exam 2023, q76m  Patient  Care Team: Lucky Cowboy, MD as PCP - General (Internal Medicine) Little Ishikawa, MD as PCP - Cardiology (Cardiology) Sharrell Ku, MD as Consulting Physician (Gastroenterology) Hanley Seamen Dustin Folks, MD as Referring Physician (Optometry) Sanjuana Letters, MD as Referring Physician (Orthopedic Surgery) Herma Mering, MD as Referring Physician (Dermatology)  SURGICAL HISTORY She  has a past surgical history that includes Spine surgery (2010); Mohs surgery (Right, 04/2013); Knee arthroscopy (Bilateral, 04/2014); Appendectomy (1959); Cesarean section (1978); Cataract extraction, bilateral (Bilateral, 2014); Colonoscopy w/ polypectomy (08/02/2017); Upper gi endoscopy (10/16/2016); and Total knee arthroplasty (Left, 03/22/2018). FAMILY HISTORY Her family history includes Diabetes in her mother; Heart disease in her father and mother; Hyperlipidemia in her sister; Hypertension in her brother and father; Stroke  in her father. SOCIAL HISTORY She  reports that she quit smoking about 36 years ago. Her smoking use included cigarettes. She has a 4.00 pack-year smoking history. She has never used smokeless tobacco. She reports that she does not currently use alcohol. She reports that she does not use drugs.   MEDICARE WELLNESS OBJECTIVES: Physical activity:   Cardiac risk factors:   Depression/mood screen:      01/31/2022    9:41 PM  Depression screen PHQ 2/9  Decreased Interest 0  Down, Depressed, Hopeless 0  PHQ - 2 Score 0    ADLs:     08/01/2022   10:39 AM 01/31/2022    9:42 PM  In your present state of health, do you have any difficulty performing the following activities:  Hearing? 0 0  Vision? 0 0  Difficulty concentrating or making decisions? 0 0  Walking or climbing stairs? 0 0  Dressing or bathing? 0 0  Doing errands, shopping? 0 0  Preparing Food and eating ? N   Using the Toilet? N   In the past six months, have you accidently leaked urine? N   Do you have problems with loss of bowel control? N   Managing your Medications? N   Managing your Finances? N   Housekeeping or managing your Housekeeping? N      Cognitive Testing  Alert? Yes  Normal Appearance?Yes  Oriented to person? Yes  Place? Yes   Time? Yes  Recall of three objects?  Yes  Can perform simple calculations? Yes  Displays appropriate judgment?Yes  Can read the correct time from a watch face?Yes  EOL planning:    Review of Systems  Constitutional:  Negative for malaise/fatigue and weight loss.  HENT:  Negative for hearing loss and tinnitus.   Eyes:  Negative for blurred vision and double vision.  Respiratory:  Negative for cough, sputum production, shortness of breath and wheezing.   Cardiovascular:  Negative for chest pain, palpitations, orthopnea, claudication, leg swelling and PND.  Gastrointestinal:  Negative for abdominal pain, blood in stool, constipation, diarrhea, heartburn, melena, nausea and  vomiting.  Genitourinary: Negative.   Musculoskeletal:  Positive for back pain (at night, chronic, hx of fracture) and joint pain (R knee). Negative for falls and myalgias.  Skin:  Negative for rash.  Neurological:  Negative for dizziness, tingling, sensory change, seizures, weakness and headaches.       Mild word finding difficulties  Endo/Heme/Allergies:  Negative for polydipsia.  Psychiatric/Behavioral:  Negative for depression, memory loss, substance abuse and suicidal ideas. The patient is nervous/anxious (improved). The patient does not have insomnia.   All other systems reviewed and are negative.    Objective:     Today's Vitals   08/01/22 1018  BP: 110/60  Pulse: 92  Resp: 16  Temp: 97.9 F (36.6 C)  SpO2: 96%  Weight: 128 lb 3.2 oz (58.2 kg)  Height: 5' 5.5" (1.664 m)   Body mass index is 21.01 kg/m.  General appearance: alert, no distress, WD/WN, female HEENT: normocephalic, sclerae anicteric, TMs pearly, nares patent, no discharge or erythema, pharynx normal Oral cavity: MMM, no lesions Neck: supple, no lymphadenopathy, no thyromegaly, no masses Heart: RRR, normal S1, S2, no murmurs Lungs: CTA bilaterally, no wheezes, rhonchi, or rales Abdomen: +bs, soft, non tender, non distended, no masses, no hepatomegaly, no splenomegaly Musculoskeletal: nontender, no swelling, no obvious deformity Extremities: no edema, no cyanosis, no clubbing Pulses: 2+ symmetric, upper and lower extremities, normal cap refill Neurological: alert, oriented x 3, CN2-12 intact, strength normal upper extremities and lower extremities, sensation normal throughout, DTRs 2+ throughout, no cerebellar signs, gait normal Psychiatric: normal affect, behavior normal, pleasant   Medicare Attestation I have personally reviewed: The patient's medical and social history Their use of alcohol, tobacco or illicit drugs Their current medications and supplements The patient's functional ability including  ADLs,fall risks, home safety risks, cognitive, and hearing and visual impairment Diet and physical activities Evidence for depression or mood disorders  The patient's weight, height, BMI, and visual acuity have been recorded in the chart.  I have made referrals, counseling, and provided education to the patient based on review of the above and I have provided the patient with a written personalized care plan for preventive services.     Adela Glimpse, NP 11:52 AM [facility]

## 2022-08-02 ENCOUNTER — Other Ambulatory Visit: Payer: Self-pay | Admitting: Cardiology

## 2022-08-02 DIAGNOSIS — E782 Mixed hyperlipidemia: Secondary | ICD-10-CM

## 2022-08-09 DIAGNOSIS — M4316 Spondylolisthesis, lumbar region: Secondary | ICD-10-CM | POA: Diagnosis not present

## 2022-08-09 DIAGNOSIS — M545 Low back pain, unspecified: Secondary | ICD-10-CM | POA: Diagnosis not present

## 2022-08-09 DIAGNOSIS — S22089A Unspecified fracture of T11-T12 vertebra, initial encounter for closed fracture: Secondary | ICD-10-CM | POA: Diagnosis not present

## 2022-09-12 ENCOUNTER — Telehealth: Payer: Self-pay | Admitting: Nurse Practitioner

## 2022-09-12 DIAGNOSIS — E782 Mixed hyperlipidemia: Secondary | ICD-10-CM

## 2022-09-12 MED ORDER — ROSUVASTATIN CALCIUM 40 MG PO TABS: 40.0000 mg | ORAL_TABLET | Freq: Every day | ORAL | 0 refills | Status: DC

## 2022-09-12 NOTE — Addendum Note (Signed)
Addended by: Dionicio Stall on: 09/12/2022 04:00 PM   Modules accepted: Orders

## 2022-09-12 NOTE — Telephone Encounter (Signed)
Pt. Is requesting refill on rosuvastatin and also looking for dermatologist recommendations.

## 2022-10-03 ENCOUNTER — Other Ambulatory Visit: Payer: Self-pay | Admitting: Nurse Practitioner

## 2022-10-03 ENCOUNTER — Ambulatory Visit
Admission: RE | Admit: 2022-10-03 | Discharge: 2022-10-03 | Disposition: A | Discharge status: Home / Self Care | Payer: Medicare PPO | Source: Ambulatory Visit | Attending: Internal Medicine | Admitting: Internal Medicine

## 2022-10-03 DIAGNOSIS — N959 Unspecified menopausal and perimenopausal disorder: Secondary | ICD-10-CM | POA: Diagnosis not present

## 2022-10-03 DIAGNOSIS — E782 Mixed hyperlipidemia: Secondary | ICD-10-CM

## 2022-10-03 DIAGNOSIS — M8000XD Age-related osteoporosis with current pathological fracture, unspecified site, subsequent encounter for fracture with routine healing: Secondary | ICD-10-CM

## 2022-10-03 DIAGNOSIS — M25532 Pain in left wrist: Secondary | ICD-10-CM | POA: Diagnosis not present

## 2022-10-03 DIAGNOSIS — E349 Endocrine disorder, unspecified: Secondary | ICD-10-CM | POA: Diagnosis not present

## 2022-10-03 MED ORDER — ROSUVASTATIN CALCIUM 40 MG PO TABS: ORAL_TABLET | ORAL | 3 refills | Status: DC

## 2022-10-03 NOTE — Progress Notes (Signed)
-   Bone Density slightly Better, but still Osteoporosis,   - So need to continue Fosamax ( Alendronate)   - Aldo suggest that you start taking Vitamin K2  / MK7 to help strengthen bones  ( Can get  on [employer] - Nutricost brand )

## 2022-10-05 ENCOUNTER — Other Ambulatory Visit: Payer: Self-pay | Admitting: Nurse Practitioner

## 2022-10-05 ENCOUNTER — Telehealth: Payer: Self-pay | Admitting: Nurse Practitioner

## 2022-10-05 DIAGNOSIS — E782 Mixed hyperlipidemia: Secondary | ICD-10-CM

## 2022-10-05 DIAGNOSIS — S52502A Unspecified fracture of the lower end of left radius, initial encounter for closed fracture: Secondary | ICD-10-CM | POA: Diagnosis not present

## 2022-10-05 MED ORDER — ROSUVASTATIN CALCIUM 40 MG PO TABS
ORAL_TABLET | ORAL | 3 refills | Status: DC
Start: 2022-10-05 — End: 2023-09-25

## 2022-10-05 MED ORDER — ROSUVASTATIN CALCIUM 40 MG PO TABS
ORAL_TABLET | ORAL | 3 refills | Status: DC
Start: 2022-10-05 — End: 2022-10-05

## 2022-10-05 NOTE — Telephone Encounter (Signed)
Requesting refill on rosuvastatin. Pls send to pharm on file.

## 2022-10-05 NOTE — Addendum Note (Signed)
Addended by: Dionicio Stall on: 10/05/2022 02:30 PM   Modules accepted: Orders

## 2022-10-09 ENCOUNTER — Other Ambulatory Visit: Payer: Self-pay | Admitting: Nurse Practitioner

## 2022-10-09 DIAGNOSIS — F411 Generalized anxiety disorder: Secondary | ICD-10-CM

## 2022-10-10 ENCOUNTER — Encounter: Payer: Medicare PPO | Admitting: Internal Medicine

## 2022-10-17 ENCOUNTER — Other Ambulatory Visit: Payer: Self-pay

## 2022-10-17 ENCOUNTER — Encounter: Payer: Self-pay | Admitting: Emergency Medicine

## 2022-10-17 ENCOUNTER — Ambulatory Visit
Admission: EM | Admit: 2022-10-17 | Discharge: 2022-10-17 | Disposition: A | Discharge status: Home / Self Care | Payer: Medicare PPO

## 2022-10-17 DIAGNOSIS — L309 Dermatitis, unspecified: Secondary | ICD-10-CM | POA: Diagnosis not present

## 2022-10-17 MED ORDER — DEXAMETHASONE SODIUM PHOSPHATE 10 MG/ML IJ SOLN
10.0000 mg | Freq: Once | INTRAMUSCULAR | Status: AC
  Administered 2022-10-17: 10 mg via INTRAMUSCULAR

## 2022-10-17 MED ORDER — PREDNISONE 20 MG PO TABS: 20.0000 mg | ORAL_TABLET | Freq: Every day | ORAL | 0 refills | Status: AC

## 2022-10-17 MED ORDER — TRIAMCINOLONE ACETONIDE 0.025 % EX CREA: 1.0000 | TOPICAL_CREAM | Freq: Two times a day (BID) | CUTANEOUS | 0 refills | Status: DC | PRN

## 2022-10-17 NOTE — ED Provider Notes (Signed)
[facility]    CSN: [id number] Arrival date & time: 10/17/22  1142      History   Chief Complaint Chief Complaint  Patient presents with   Rash    HPI Deborah [name] is a 77 y.o. female.   HPI Patient here today for evaluation of recurrent generalized body rash. She reports development of a similar type rash last year. Patient denies any new changes in soaps, detergent, or exposure to any outdoor irritants. She reports small red bump like lesions erupting all of over her body including bilateral inguinal areas. The rash itches continuously and itches worst at nighttime. Current rash eruption present x for over 7 days. Denies any rash or itching around face, eyes, or mouth. Patient reports rash cleared last year following treatment with prednisone.  Past Medical History:  Diagnosis Date   Allergy    Anemia    Anxiety    Aortic atherosclerosis (HCC) 11/2016   Noted on CT abd   Arthritis    hands and knees   Bilateral cataracts    History of   Cellulitis    Right finger   Cerebral amyloid angiopathy (CODE)    Cervical spondylosis 12/2008   Noted on Cervical spine   Chronic nausea    Compression fracture of T12 vertebra (HCC) 06/07/2017   Mild   DDD (degenerative disc disease), cervical 12/2008   Noted on Cervical spine   Delusions (HCC) 03/24/2018   Depression    Encephalopathy 01/02/2017   Mild, [facility]   GERD (gastroesophageal reflux disease)    Helicobacter pylori infection 2018   History of appendicitis    age 77   History of colon polyps    History of confusion    History of hiatal hernia 10/16/2016   Noted on Endoscopy   Hyperlipidemia    Incontinence in female 08/06/2014   Internal hemorrhoid 08/02/2017   Noted on Colonoscopy   Low blood pressure reading    Migraines    Osteopenia    PONV (postoperative nausea and vomiting)    Pre-diabetes    RLS (restless legs syndrome)    Seizure (HCC)    atypical partial comples vs. confusional  migraines   Seizure disorder (HCC) 12/15/2016   Skin cancer    right lower eyelid   Squamous cell skin cancer    history of neck and upper back   Syncope 03/25/2018   TIA (transient ischemic attack)    questionable per patient   Vitamin D deficiency     Patient Active Problem List   Diagnosis Date Noted   Primary osteoarthritis of left knee 03/22/2018   GERD (gastroesophageal reflux disease) 02/25/2018   Osteopenia 02/25/2018   Compression fracture of T12 vertebra (HCC) 05/30/2017   Aortic atherosclerosis (HCC) by Abd CT scan  11/2016 02/03/2017   History of TIA (transient ischemic attack) 12/15/2016   BMI 22.0-22.9, adult 03/12/2015   History of skin cancer 08/06/2014   Migraine 08/06/2014   Generalized anxiety disorder 08/06/2014   Abnormal glucose 07/01/2013   Medication management 07/01/2013   Hypertension    Hyperlipidemia    Vitamin D deficiency     Past Surgical History:  Procedure Laterality Date   APPENDECTOMY  1959   CATARACT EXTRACTION, BILATERAL Bilateral 2014   CESAREAN SECTION  1978   COLONOSCOPY W/ POLYPECTOMY  08/02/2017   KNEE ARTHROSCOPY Bilateral 04/2014   [facility]   MOHS SURGERY Right 04/2013   Squamous cell cacinoma, neck/upper back   SPINE  SURGERY  2010   C6-C7 fusion   TOTAL KNEE ARTHROPLASTY Left 03/22/2018   Procedure: LEFT TOTAL KNEE ARTHROPLASTY;  Surgeon: Jodi Geralds, MD;  Location: [facility];  Service: Orthopedics;  Laterality: Left;  Adductor Block   UPPER GI ENDOSCOPY  10/16/2016    OB History   No obstetric history on file.      Home Medications    Prior to Admission medications   Medication Sig Start Date End Date Taking? Authorizing Provider  Aspirin-Calcium Carbonate (BAYER WOMENS) [phone] MG TABS Take by mouth. 09/01/16  Yes [provider]  Benzocaine-Menthol (CEPACOL SORE THROAT EX ST) 15-3.6 MG LOZG Take 1 lozenge every 4-6 hours by mucous route. 08/22/21  Yes [provider]  docusate sodium (COLACE)  100 MG capsule Take 1 capsule every day by oral route for 10 days. 11/09/21  Yes [provider]  Ergocalciferol 10 MCG (400 UNIT) TABS Take by mouth. 09/01/16  Yes [provider]  famotidine (PEPCID) 40 MG tablet Take by mouth. 11/20/16  Yes [provider]  HYDROcodone-acetaminophen (NORCO/VICODIN) 5-325 MG tablet Take 1 tablet by mouth 2 (two) times daily as needed. 04/24/22  Yes [provider]  oxyCODONE-acetaminophen (PERCOCET) 10-325 MG tablet TAKE 1 TABLET BY MOUTH EVERY 6 HOURS AS NEEDED FOR 5 DAYS 11/09/21  Yes [provider]  predniSONE (DELTASONE) 20 MG tablet Take 1 tablet (20 mg total) by mouth daily with breakfast for 5 days. 10/18/22 10/23/22 Yes Bing Neighbors, NP  triamcinolone (KENALOG) 0.025 % cream Apply 1 Application topically 2 (two) times daily as needed (Skin rash). 10/17/22  Yes Bing Neighbors, NP  alendronate (FOSAMAX) 70 MG tablet Take      1 tablet      every 7 days       with a full glass of water       on an empty stomach       for 60 minutes 04/24/22   Cranford, Archie Patten, NP  ALPRAZolam (XANAX) 0.5 MG tablet TAKE 1/2 TO 1 TABLET BY MOUTH 2 TIMES A DAILY ONLY IF NEEDED FOR ANXIETY ATTACK AND LIMIT TO 5 DAYS A WEEK TO AVOID ADDICTION 07/26/21   Judd Gaudier, NP  AMOXICILLIN PO TAKE FOUR CAPSULES BY MOUTH ONE HOUR BEFORE APPOINTMENT    [provider]  amoxicillin-clavulanate (AUGMENTIN) 875-125 MG tablet Take 1 tablet by mouth every 12 (twelve) hours.    [provider]  aspirin EC 81 MG tablet Take 1 tablet (81 mg total) by mouth daily. 11/30/20   Little Ishikawa, MD  azelastine (ASTELIN) 0.1 % nasal spray USE 2 SPRAY(S) IN EACH NOSTRIL TWICE DAILY    [provider]  azithromycin (ZITHROMAX) 250 MG tablet TAKE 2 TABLETS BY MOUTH ON DAY 1, AND THEN TAKE 1 TABLET BY MOUTH ONCE A DAY ON DAY 2 THROUGH DAY 5 Patient not taking: Reported on 10/17/2022    [provider]  busPIRone (BUSPAR) 10 MG  tablet Take  1 tablet  3 x /day  as needed for Nerves                                                                      /  TAKE                                               BY                                     MOUTH 04/19/22   Lucky Cowboy, MD  busPIRone (BUSPAR) 10 MG tablet Take 1 tablet by mouth 3 (three) times daily as needed.    [provider]  Cholecalciferol (VITAMIN D3) 125 MCG (5000 UT) CAPS Take 5,000 Units by mouth daily.    [provider]  diclofenac Sodium (VOLTAREN) 1 % GEL Apply 2 g topically 4 (four) times daily. 06/29/21   Judd Gaudier, NP  diltiazem (CARDIZEM CD) 120 MG 24 hr capsule Take 1 capsule at Night  for BP & Prevent Migraines 04/27/21   Lucky Cowboy, MD  escitalopram (LEXAPRO) 10 MG tablet Take 1 tablet by mouth once daily 10/10/22   Raynelle Dick, NP  escitalopram (LEXAPRO) 10 MG tablet Take 1 tablet by mouth daily.    [provider]  ezetimibe (ZETIA) 10 MG tablet Take  1 tablet  Daily for Cholesterol                                                                    /                                     TAKE                                     BY                                            MOUTH                                          ONCE DAILY 03/22/22   Lucky Cowboy, MD  ezetimibe (ZETIA) 10 MG tablet Take 1 tablet by mouth daily.    [provider]  methocarbamol (ROBAXIN) 500 MG tablet TAKE 1 TABLET BY MOUTH 4 TIMES DAILY AS NEEDED FOR 10 DAYS    [provider]  Multiple Vitamins-Minerals (MULTIVITAMIN PO) Take 1 tablet by mouth daily.    [provider]  ondansetron (ZOFRAN-ODT) 4 MG disintegrating tablet DISSOLVE 2 TABLETS IN MOUTH TWICE DAILY AS NEEDED    [provider]  oxyCODONE-acetaminophen (PERCOCET/ROXICET) 5-325 MG tablet     [provider]  rosuvastatin (CRESTOR) 10 MG tablet Take 1 tablet every day by oral  route.    [provider]  rosuvastatin (CRESTOR) 20 MG tablet Take 1 tablet by mouth daily.  [provider]  rosuvastatin (CRESTOR) 40 MG tablet Take  1 tablet   Daily  for Cholesterol 10/05/22   Adela Glimpse, NP  rosuvastatin (CRESTOR) 40 MG tablet Take 1 tablet by mouth daily.    [provider]    Family History Family History  Problem Relation Age of Onset   Diabetes Mother    Heart disease Mother    Heart disease Father    Stroke Father    Hypertension Father    Hyperlipidemia Sister    Hypertension Brother     Social History Social History   Tobacco Use   Smoking status: Former    Packs/day: 1.00    Years: 4.00    Additional pack years: 0.00    Total pack years: 4.00    Types: Cigarettes    Quit date: 03/25/1986    Years since quitting: 36.6   Smokeless tobacco: Never  Vaping Use   Vaping Use: Never used  Substance Use Topics   Alcohol use: Not Currently   Drug use: No     Allergies   Rizatriptan, Banana, Banana extract allergy skin test, Chocolate, Guaifenesin-codeine, Maxalt [rizatriptan benzoate], Other, Venlafaxine, Tussin [guaifenesin], and Zostavax [zoster vaccine live]   Review of Systems Review of Systems Pertinent negatives listed in HPI   Physical Exam Triage Vital Signs ED Triage Vitals  Enc Vitals Group     BP 10/17/22 1248 123/82     Pulse Rate 10/17/22 1248 94     Resp 10/17/22 1248 18     Temp 10/17/22 1248 98.3 F (36.8 C)     Temp Source 10/17/22 1248 Oral     SpO2 10/17/22 1248 95 %     Weight --      Height --      Head Circumference --      Peak Flow --      Pain Score 10/17/22 1249 0     Pain Loc --      Pain Edu? --      Excl. in GC? --    No data found.  Updated Vital Signs BP 123/82 (BP Location: Left Arm)   Pulse 94   Temp 98.3 F (36.8 C) (Oral)   Resp 18   SpO2 95%   Visual Acuity Right Eye Distance:   Left Eye Distance:   Bilateral Distance:    Right Eye Near:   Left  Eye Near:    Bilateral Near:     Physical Exam HENT:     Head: Normocephalic and atraumatic.     Mouth/Throat:     Lips: Pink.     Mouth: Mucous membranes are moist.  Eyes:     Extraocular Movements: Extraocular movements intact.     Conjunctiva/sclera: Conjunctivae normal.     Pupils: Pupils are equal, round, and reactive to light.  Cardiovascular:     Rate and Rhythm: Normal rate and regular rhythm.  Pulmonary:     Effort: Pulmonary effort is normal.     Breath sounds: Normal breath sounds.  Musculoskeletal:        General: Normal range of motion.     Cervical back: Normal range of motion and neck supple.  Skin:    Findings: Rash present. Rash is papular and urticarial.  Neurological:     General: No focal deficit present.  Psychiatric:        Behavior: Behavior is cooperative.    UC Treatments / Results  Labs (all labs ordered are listed, but  only abnormal results are displayed) Labs Reviewed - No data to display  EKG   Radiology No results found.  Procedures Procedures (including critical care time)  Medications Ordered in UC Medications  dexamethasone (DECADRON) injection 10 mg (10 mg Intramuscular Given 10/17/22 1315)    Initial Impression / Assessment and Plan / UC Course  I have reviewed the triage vital signs and the nursing notes.  Pertinent labs & imaging results that were available during my care of the patient were reviewed by me and considered in my medical decision making (see chart for details).     Dermatitis, unknown etiology. Decadron 10 mg IM given here in clinic. Continue management of dermatitis eruptions with prednisone 20 mg daily x 5 days and topical triamcinolone cream BID PRN for rash.  Return precautions if symptoms worsen or do not improve.  Final Clinical Impressions(s) / UC Diagnoses   Final diagnoses:  Dermatitis, generalized   Discharge Instructions   None    ED Prescriptions     Medication Sig Dispense Auth. Provider    predniSONE (DELTASONE) 20 MG tablet Take 1 tablet (20 mg total) by mouth daily with breakfast for 5 days. 5 tablet Bing Neighbors, NP   triamcinolone (KENALOG) 0.025 % cream Apply 1 Application topically 2 (two) times daily as needed (Skin rash). 454 g Bing Neighbors, NP      PDMP not reviewed this encounter.   Bing Neighbors, NP 10/22/22 [phone]

## 2022-10-17 NOTE — ED Triage Notes (Signed)
Pt here for generalized rash/hives x 1 week; pt sts similar at same time last year; pt c/o itching

## 2022-10-18 ENCOUNTER — Ambulatory Visit: Payer: Medicare PPO | Admitting: Nurse Practitioner

## 2022-10-25 ENCOUNTER — Encounter: Payer: Medicare PPO | Admitting: Internal Medicine

## 2022-11-01 ENCOUNTER — Ambulatory Visit (INDEPENDENT_AMBULATORY_CARE_PROVIDER_SITE_OTHER): Payer: Medicare PPO | Admitting: Internal Medicine

## 2022-11-01 ENCOUNTER — Encounter: Payer: Self-pay | Admitting: Internal Medicine

## 2022-11-01 VITALS — BP 108/70 | HR 83 | Temp 97.8°F | Ht 65.5 in | Wt 131.8 lb

## 2022-11-01 DIAGNOSIS — R7309 Other abnormal glucose: Secondary | ICD-10-CM

## 2022-11-01 DIAGNOSIS — Z87891 Personal history of nicotine dependence: Secondary | ICD-10-CM

## 2022-11-01 DIAGNOSIS — Z8673 Personal history of transient ischemic attack (TIA), and cerebral infarction without residual deficits: Secondary | ICD-10-CM

## 2022-11-01 DIAGNOSIS — E782 Mixed hyperlipidemia: Secondary | ICD-10-CM

## 2022-11-01 DIAGNOSIS — Z0001 Encounter for general adult medical examination with abnormal findings: Secondary | ICD-10-CM

## 2022-11-01 DIAGNOSIS — I700 Atherosclerosis of aorta: Secondary | ICD-10-CM

## 2022-11-01 DIAGNOSIS — Z1211 Encounter for screening for malignant neoplasm of colon: Secondary | ICD-10-CM

## 2022-11-01 DIAGNOSIS — E559 Vitamin D deficiency, unspecified: Secondary | ICD-10-CM

## 2022-11-01 DIAGNOSIS — Z136 Encounter for screening for cardiovascular disorders: Secondary | ICD-10-CM | POA: Diagnosis not present

## 2022-11-01 DIAGNOSIS — Z79899 Other long term (current) drug therapy: Secondary | ICD-10-CM | POA: Diagnosis not present

## 2022-11-01 DIAGNOSIS — I10 Essential (primary) hypertension: Secondary | ICD-10-CM

## 2022-11-01 DIAGNOSIS — Z0000 Encounter for general adult medical examination without abnormal findings: Secondary | ICD-10-CM | POA: Diagnosis not present

## 2022-11-01 DIAGNOSIS — K219 Gastro-esophageal reflux disease without esophagitis: Secondary | ICD-10-CM

## 2022-11-01 DIAGNOSIS — G43109 Migraine with aura, not intractable, without status migrainosus: Secondary | ICD-10-CM

## 2022-11-01 DIAGNOSIS — Z8249 Family history of ischemic heart disease and other diseases of the circulatory system: Secondary | ICD-10-CM

## 2022-11-01 NOTE — Progress Notes (Signed)
Annual Screening/Preventative Visit & Comprehensive Evaluation &  Examination   Future Appointments  Date Time Provider Department  11/01/2022                                    cpe 11:00 AM Lucky Cowboy, MD [facility]  11/13/2023                                   cpe  3:00 PM Lucky Cowboy, MD [facility]          This very nice 76 y.o. MWF presents for a Screening /Preventative Visit & comprehensive evaluation and management of multiple medical co-morbidities.  Patient has been followed for HTN, HLD, Prediabetes  and Vitamin D Deficiency. Abd CT scan showed Aortic Atherosclerosis  in 2018.         [[  copied 06/24/2019: In 2018, she was hospitalized with confusion, first dx'd as atypical seizure vs confusional migraine. Initially she was treated with Topamax then Keppra  ]]                                                     Patient does report occasional frustration with anomia, occasionally having difficulty  immediately remembering certain words.         HTN predates since 2010. Patient's BP has been controlled at home and patient denies any cardiac symptoms as chest pain, palpitations, shortness of breath, dizziness or ankle swelling. Today's BP is at goal -  108/70 .        Patient's hyperlipidemia is controlled with diet and Rosuvastatin. Patient denies myalgias or other medication SE's. Last lipids were at goal:  Lab Results  Component Value Date   CHOL 148 04/19/2022   HDL 72 04/19/2022   LDLCALC 59 04/19/2022   TRIG 90 04/19/2022   CHOLHDL 2.1 04/19/2022        Patient has hx/o prediabetes (A1c 6.1% /2011) and patient denies reactive hypoglycemic symptoms, visual blurring, diabetic polys or paresthesias. Last A1c was near goal:  Lab Results  Component Value Date   HGBA1C 5.9 (H) 04/19/2022         Finally, patient has history of Vitamin D Deficiency ("18" /2008) and last Vitamin D was at goal:  Lab Results  Component Value Date   VD25OH  91 04/19/2022      Current Outpatient Medications  Medication Instructions   alendronate ( 70 MG tablet Take      1 tablet      every 7 days   aspirin EC 81 mg, Oral, Daily   azelastine (ASTELIN) 0.1 % nasal spray USE 2 SPRAY(S) IN EACH NOSTRIL TWICE DAILY   busPIRone 10 MG tablet Take  1 tablet  3 x /day  as needed    diclofenac  2 g, Topical, 4 times daily   diltiazem CD  120 MG  Take 1 capsule at Night  for BP & Prevent Migraines   escitalopram (LEXAPRO) 10 MG tablet Take 1 tablet by mouth once daily   ezetimibe (ZETIA) 10 MG tablet Take  1 tablet  Daily    Multiple Vitamins-Minerals  1 tablet, Oral, Daily   ondansetron (ZOFRAN-ODT) 4  MG  DISSOLVE 2 TABLETS IN MOUTH TWICE DAILY AS NEEDED   rosuvastatin (CRESTOR) 40 MG tablet Take  1 tablet   Daily  for Cholesterol   triamcinolone (KENALOG) 0.025 % cream 1 Application, Topical, 2 times daily PRN   Vitamin D3 5,000 Units, Oral, Daily     Allergies  Allergen Reactions   Banana Nausea And Vomiting   Chocolate Other (See Comments)    Unknown   Effexor [Venlafaxine] Other (See Comments)    insomnia   Maxalt [Rizatriptan Benzoate] Nausea Only and Other (See Comments)    Nausea and excessive salivation   Other Other (See Comments)    NUTS (all kinds)   Tussin [Guaifenesin] Rash   Zostavax [Zoster Vaccine Live] Itching and Rash    Past Medical History:  Diagnosis Date   Allergy    Anemia    Anxiety    Aortic atherosclerosis (HCC) 11/2016   Noted on CT abd   Arthritis    hands and knees   Bilateral cataracts    History of   Cellulitis    Right finger   Cerebral amyloid angiopathy (CODE)    Cervical spondylosis 12/2008   Noted on Cervical spine   Chronic nausea    Compression fracture of T12 vertebra (HCC) 06/07/2017   Mild   DDD (degenerative disc disease), cervical 12/2008   Noted on Cervical spine   Delusions (HCC) 03/24/2018   Depression    Encephalopathy 01/02/2017   Mild, [facility]   GERD (gastroesophageal  reflux disease)    Helicobacter pylori infection 2018   History of appendicitis    age 73   History of colon polyps    History of confusion    History of hiatal hernia 10/16/2016   Noted on Endoscopy   Hyperlipidemia    Incontinence in female 08/06/2014   Internal hemorrhoid 08/02/2017   Noted on Colonoscopy   Low blood pressure reading    Migraines    Osteopenia    PONV (postoperative nausea and vomiting)    Pre-diabetes    RLS (restless legs syndrome)    Seizure (HCC)    atypical partial comples vs. confusional migraines   Seizure disorder (HCC) 12/15/2016   Skin cancer    right lower eyelid   Squamous cell skin cancer    history of neck and upper back   Syncope 03/25/2018   TIA (transient ischemic attack)    questionable per patient   Vitamin D deficiency      Health Maintenance  Topic Date Due   COLONOSCOPY  08/03/2019   COVID-19 Vaccine (4 - Booster for Pfizer series) 04/19/2020   INFLUENZA VACCINE  11/15/2020   MAMMOGRAM  06/22/2021   TETANUS/TDAP  05/09/2027   DEXA SCAN  Completed   Hepatitis C Screening  Completed   PNA vac Low Risk Adult  Completed   HPV VACCINES  Aged Out    Last Colon - 10/28/2020- Dr Kinnie Scales  - recc 3 year f/u due July 2025   MGM - 06/07/2022   Past Surgical History:  Procedure Laterality Date   APPENDECTOMY  1959   CATARACT EXTRACTION, BILATERAL Bilateral 2014   CESAREAN SECTION  1978   COLONOSCOPY W/ POLYPECTOMY  08/02/2017   KNEE ARTHROSCOPY Bilateral 04/2014   [facility]   MOHS SURGERY Right 04/2013   Squamous cell cacinoma, neck/upper back   SPINE SURGERY  2010   C6-C7 fusion   TOTAL KNEE ARTHROPLASTY Left 03/22/2018   Procedure: LEFT TOTAL KNEE ARTHROPLASTY;  Surgeon: Jodi Geralds, MD;  Location: [facility];  Service: Orthopedics;  Laterality: Left;  Adductor Block   UPPER GI ENDOSCOPY  10/16/2016     Family History  Problem Relation Age of Onset   Diabetes Mother    Heart disease Mother    Heart disease Father     Stroke Father    Hypertension Father    Hyperlipidemia Sister    Hypertension Brother     Social History   Tobacco Use   Smoking status: Former Smoker    Packs/day: 1.00    Years: 4.00    Pack years: 4.00    Types: Cigarettes    Quit date: 03/25/1986    Years since quitting: 34.4   Smokeless tobacco: Never Used  Vaping Use   Vaping Use: Never used  Substance Use Topics   Alcohol use: Not Currently    Comment: occasional   Drug use: No      ROS Constitutional: Denies fever, chills, weight loss/gain, headaches, insomnia,  night sweats, and change in appetite. Does c/o fatigue. Eyes: Denies redness, blurred vision, diplopia, discharge, itchy, watery eyes.  ENT: Denies discharge, congestion, post nasal drip, epistaxis, sore throat, earache, hearing loss, dental pain, Tinnitus, Vertigo, Sinus pain, snoring.  Cardio: Denies chest pain, palpitations, irregular heartbeat, syncope, dyspnea, diaphoresis, orthopnea, PND, claudication, edema Respiratory: denies cough, dyspnea, DOE, pleurisy, hoarseness, laryngitis, wheezing.  Gastrointestinal: Denies dysphagia, heartburn, reflux, water brash, pain, cramps, nausea, vomiting, bloating, diarrhea, constipation, hematemesis, melena, hematochezia, jaundice, hemorrhoids Genitourinary: Denies dysuria, frequency, urgency, nocturia, hesitancy, discharge, hematuria, flank pain Breast: Breast lumps, nipple discharge, bleeding.  Musculoskeletal: Denies arthralgia, myalgia, stiffness, Jt. Swelling, pain, limp, and strain/sprain. Denies falls. Skin: Denies puritis, rash, hives, warts, acne, eczema, changing in skin lesion Neuro: No weakness, tremor, incoordination, spasms, paresthesia, pain Psychiatric: Denies confusion, memory loss, sensory loss. Denies Depression. Endocrine: Denies change in weight, skin, hair change, nocturia, and paresthesia, diabetic polys, visual blurring, hyper / hypo glycemic episodes.  Heme/Lymph: No excessive bleeding,  bruising, enlarged lymph nodes.  Physical Exam  BP 108/70   Pulse 83   Temp 97.8 F (36.6 C)   Ht 5' 5.5" (1.664 m)   Wt 131 lb 12.8 oz (59.8 kg)   SpO2 98%   BMI 21.60 kg/m   Postural              Sitting      BP   139/91     P  81                &                 Standing     BP 123/85     P 98   General Appearance: Well nourished, well groomed and in no apparent distress.  Eyes: PERRLA, EOMs, conjunctiva no swelling or erythema, normal fundi and vessels. Sinuses: No frontal/maxillary tenderness ENT/Mouth: EACs patent / TMs  nl. Nares clear without erythema, swelling, mucoid exudates. Oral hygiene is good. No erythema, swelling, or exudate. Tongue normal, non-obstructing. Tonsils not swollen or erythematous. Hearing normal.  Neck: Supple, thyroid not palpable. No bruits, nodes or JVD. Respiratory: Respiratory effort normal.  BS equal and clear bilateral without rales, rhonci, wheezing or stridor. Cardio: Heart sounds are normal with regular rate and rhythm and no murmurs, rubs or gallops. Peripheral pulses are normal and equal bilaterally without edema. No aortic or femoral bruits. Chest: symmetric with normal excursions and percussion. Breasts: Symmetric, without lumps, nipple discharge,  retractions, or fibrocystic changes.  Abdomen: Flat, soft with bowel sounds active. Nontender, no guarding, rebound, hernias, masses, or organomegaly.  Lymphatics: Non tender without lymphadenopathy.  Musculoskeletal: Full ROM all peripheral extremities, joint stability, 5/5 strength, and normal gait. Skin: Warm and dry without rashes, lesions, cyanosis, clubbing or  ecchymosis.  Neuro: Cranial nerves intact, reflexes equal bilaterally. Normal muscle tone, no cerebellar symptoms. Sensation intact.  Pysch: Alert and oriented X 3, normal affect, Insight and Judgment appropriate.    Assessment and Plan  1. Annual Preventative Screening Examination   2. Essential hypertension  - CBC with  Differential/Platelet - COMPLETE METABOLIC PANEL WITH GFR - Magnesium - TSH - EKG 12-Lead - Urinalysis, Routine w reflex microscopic - Microalbumin / creatinine urine ratio  3. Hyperlipidemia, mixed  - CBC with Differential/Platelet - Lipid panel - TSH - EKG 12-Lead  4. Abnormal glucose  - CBC with Differential/Platelet - Hemoglobin A1c - Insulin, random - EKG 12-Lead  5. Vitamin D deficiency  - VITAMIN D 25 Hydroxy    6. Aortic atherosclerosis (HCC) by Abd CT scan  11/2016  - CBC with Differential/Platelet - COMPLETE METABOLIC PANEL WITH GFR - Magnesium - Lipid panel - TSH - Hemoglobin A1c - Insulin, random - VITAMIN D 25 Hydroxy - EKG 12-Lead  7 Gastroesophageal reflux disease without esophagitis   8. Migraine    9.  History of TIA (transient ischemic attack)   10. Screening for colorectal cancer  - POC Hemoccult Bld/Stl   11. Screening for ischemic heart disease  - EKG 12-Lead  12. FHx: heart disease  - EKG 12-Lead  13. Former smoker  - EKG 12-Lead  14. Medication management  - CBC with Differential/Platelet - COMPLETE METABOLIC PANEL WITH GFR - Magnesium - Lipid panel - TSH - Hemoglobin A1c - Insulin, random - VITAMIN D 25 Hydroxy - Urinalysis, Routine w reflex microscopic - Microalbumin / creatinine urine ratio        Patient was counseled in prudent diet to achieve/maintain BMI less than 25 for weight control, BP monitoring, regular exercise and medications. Discussed med's effects and SE's.                                              Screening labs and tests as requested with regular follow-up as recommended. Over 40 minutes of exam, counseling, chart review and high complex critical decision making was performed.   Marinus Maw, MD

## 2022-11-01 NOTE — Patient Instructions (Signed)

## 2022-11-02 DIAGNOSIS — S52502A Unspecified fracture of the lower end of left radius, initial encounter for closed fracture: Secondary | ICD-10-CM | POA: Diagnosis not present

## 2022-11-02 LAB — MICROALBUMIN / CREATININE URINE RATIO
Creatinine, Urine: 49 mg/dL (ref 20–275)
Microalb Creat Ratio: 14 mg/g creat (ref <30)
Microalb, Ur: 0.7 mg/dL

## 2022-11-02 LAB — INSULIN, RANDOM: Insulin: 10.3 u[IU]/mL

## 2022-11-02 LAB — URINALYSIS, ROUTINE W REFLEX MICROSCOPIC
Bacteria, UA: NONE SEEN /HPF
Bilirubin Urine: NEGATIVE
Glucose, UA: NEGATIVE
Hgb urine dipstick: NEGATIVE
Hyaline Cast: NONE SEEN /LPF
Ketones, ur: NEGATIVE
Nitrite: NEGATIVE
Protein, ur: NEGATIVE
RBC / HPF: NONE SEEN /HPF (ref 0–2)
Specific Gravity, Urine: 1.009 (ref 1.001–1.035)
Squamous Epithelial / HPF: NONE SEEN /HPF (ref <=5)
pH: 6 (ref 5.0–8.0)

## 2022-11-02 LAB — CBC WITH DIFFERENTIAL/PLATELET
Absolute Monocytes: 662 cells/uL (ref 200–950)
Basophils Absolute: 69 cells/uL (ref 0–200)
Basophils Relative: 1 %
Eosinophils Absolute: 200 cells/uL (ref 15–500)
Eosinophils Relative: 2.9 %
HCT: 34.8 % — ABNORMAL LOW (ref 35.0–45.0)
Hemoglobin: 11.5 g/dL — ABNORMAL LOW (ref 11.7–15.5)
Lymphs Abs: 1891 cells/uL (ref 850–3900)
MCH: 30.3 pg (ref 27.0–33.0)
MCHC: 33 g/dL (ref 32.0–36.0)
MCV: 91.8 fL (ref 80.0–100.0)
MPV: 9.3 fL (ref 7.5–12.5)
Monocytes Relative: 9.6 %
Neutro Abs: 4078 cells/uL (ref 1500–7800)
Neutrophils Relative %: 59.1 %
Platelets: 293 10*3/uL (ref 140–400)
RBC: 3.79 10*6/uL — ABNORMAL LOW (ref 3.80–5.10)
RDW: 11.9 % (ref 11.0–15.0)
Total Lymphocyte: 27.4 %
WBC: 6.9 10*3/uL (ref 3.8–10.8)

## 2022-11-02 LAB — COMPLETE METABOLIC PANEL WITH GFR
AG Ratio: 1.6 (calc) (ref 1.0–2.5)
ALT: 13 U/L (ref 6–29)
AST: 21 U/L (ref 10–35)
Albumin: 3.9 g/dL (ref 3.6–5.1)
Alkaline phosphatase (APISO): 63 U/L (ref 37–153)
BUN: 15 mg/dL (ref 7–25)
CO2: 29 mmol/L (ref 20–32)
Calcium: 9.5 mg/dL (ref 8.6–10.4)
Chloride: 100 mmol/L (ref 98–110)
Creat: 0.76 mg/dL (ref 0.60–1.00)
Globulin: 2.5 g/dL (calc) (ref 1.9–3.7)
Glucose, Bld: 89 mg/dL (ref 65–99)
Potassium: 4.6 mmol/L (ref 3.5–5.3)
Sodium: 136 mmol/L (ref 135–146)
Total Bilirubin: 0.6 mg/dL (ref 0.2–1.2)
Total Protein: 6.4 g/dL (ref 6.1–8.1)
eGFR: 81 mL/min/{1.73_m2} (ref >=60)

## 2022-11-02 LAB — HEMOGLOBIN A1C W/OUT EAG: Hgb A1c MFr Bld: 5.9 % of total Hgb — ABNORMAL HIGH (ref <5.7)

## 2022-11-02 LAB — TSH: TSH: 2.13 mIU/L (ref 0.40–4.50)

## 2022-11-02 LAB — LIPID PANEL
Cholesterol: 147 mg/dL (ref <200)
HDL: 66 mg/dL (ref >=50)
LDL Cholesterol (Calc): 65 mg/dL (calc)
Non-HDL Cholesterol (Calc): 81 mg/dL (calc) (ref <130)
Total CHOL/HDL Ratio: 2.2 (calc) (ref <5.0)
Triglycerides: 80 mg/dL (ref <150)

## 2022-11-02 LAB — MAGNESIUM: Magnesium: 2.3 mg/dL (ref 1.5–2.5)

## 2022-11-02 LAB — MICROSCOPIC MESSAGE

## 2022-11-02 LAB — VITAMIN D 25 HYDROXY (VIT D DEFICIENCY, FRACTURES): Vit D, 25-Hydroxy: 94 ng/mL (ref 30–100)

## 2022-11-02 NOTE — Progress Notes (Signed)
^<^<^<^<^<^<^<^<^<^<^<^<^<^<^<^<^<^<^<^<^<^<^<^<^<^<^<^<^<^<^<^<^<^<^<^<^ ^>^>^>^>^>^>^>^>^>^>^>>^>^>^>^>^>^>^>^>^>^>^>^>^>^>^>^>^>^>^>^>^>^>^>^>^>  -  Test results slightly outside the reference range are not unusual. If there is anything important, I will review this with you,  otherwise it is considered normal test values.  If you have further questions,  please do not hesitate to contact me at the office or via My Chart.   ^<^<^<^<^<^<^<^<^<^<^<^<^<^<^<^<^<^<^<^<^<^<^<^<^<^<^<^<^<^<^<^<^<^<^<^<^ ^>^>^>^>^>^>^>^>^>^>^>^>^>^>^>^>^>^>^>^>^>^>^>^>^>^>^>^>^>^>^>^>^>^>^>^>^  -  A1c -= 5.9 - still borderline high normal 12 week average blood sugar , So . . . Marland Kitchen   - Avoid Sweets, Candy & White Stuff   - White Rice, [place], White Flour  - Breads &  Pasta  ^>^>^>^>^>^>^>^>^>^>^>^>^>^>^>^>^>^>^>^>^>^>^>^>^>^>^>^>^>^>^>^>^>^>^>^>^ ^>^>^>^>^>^>^>^>^>^>^>^>^>^>^>^>^>^>^>^>^>^>^>^>^>^>^>^>^>^>^>^>^>^>^>^>^  -  Vitamin D = 94 - Excellent     - Please keep dose same   ^>^>^>^>^>^>^>^>^>^>^>^>^>^>^>^>^>^>^>^>^>^>^>^>^>^>^>^>^>^>^>^>^>^>^>^>^  -  All Else - CBC - Kidneys - Electrolytes - Liver - Magnesium & Thyroid    - all  Normal / OK  ^>^>^>^>^>^>^>^>^>^>^>^>^>^>^>^>^>^>^>^>^>^>^>^>^>^>^>^>^>^>^>^>^>^>^>^>^ ^>^>^>^>^>^>^>^>^>^>^>^>^>^>^>^>^>^>^>^>^>^>^>^>^>^>^>^>^>^>^>^>^>^>^>^>^

## 2022-11-28 DIAGNOSIS — L821 Other seborrheic keratosis: Secondary | ICD-10-CM | POA: Diagnosis not present

## 2022-11-28 DIAGNOSIS — Z85828 Personal history of other malignant neoplasm of skin: Secondary | ICD-10-CM | POA: Diagnosis not present

## 2022-11-28 DIAGNOSIS — Z08 Encounter for follow-up examination after completed treatment for malignant neoplasm: Secondary | ICD-10-CM | POA: Diagnosis not present

## 2022-11-28 DIAGNOSIS — L508 Other urticaria: Secondary | ICD-10-CM | POA: Diagnosis not present

## 2022-11-28 DIAGNOSIS — C4492 Squamous cell carcinoma of skin, unspecified: Secondary | ICD-10-CM | POA: Diagnosis not present

## 2022-12-08 DIAGNOSIS — S52502A Unspecified fracture of the lower end of left radius, initial encounter for closed fracture: Secondary | ICD-10-CM | POA: Diagnosis not present

## 2022-12-25 DIAGNOSIS — M79642 Pain in left hand: Secondary | ICD-10-CM | POA: Diagnosis not present

## 2022-12-25 DIAGNOSIS — S52502A Unspecified fracture of the lower end of left radius, initial encounter for closed fracture: Secondary | ICD-10-CM | POA: Diagnosis not present

## 2022-12-28 ENCOUNTER — Ambulatory Visit (INDEPENDENT_AMBULATORY_CARE_PROVIDER_SITE_OTHER): Payer: Medicare PPO | Admitting: Nurse Practitioner

## 2022-12-28 ENCOUNTER — Encounter: Payer: Self-pay | Admitting: Nurse Practitioner

## 2022-12-28 VITALS — BP 102/70 | HR 89 | Temp 97.6°F | Ht 65.5 in | Wt 132.2 lb

## 2022-12-28 DIAGNOSIS — L299 Pruritus, unspecified: Secondary | ICD-10-CM | POA: Diagnosis not present

## 2022-12-28 DIAGNOSIS — R21 Rash and other nonspecific skin eruption: Secondary | ICD-10-CM | POA: Diagnosis not present

## 2022-12-28 DIAGNOSIS — Z789 Other specified health status: Secondary | ICD-10-CM

## 2022-12-28 DIAGNOSIS — L309 Dermatitis, unspecified: Secondary | ICD-10-CM | POA: Diagnosis not present

## 2022-12-28 MED ORDER — PREDNISONE 10 MG PO TABS
ORAL_TABLET | ORAL | 0 refills | Status: DC
Start: 2022-12-28 — End: 2023-09-25

## 2022-12-28 MED ORDER — DEXAMETHASONE SODIUM PHOSPHATE 10 MG/ML IJ SOLN
10.0000 mg | Freq: Once | INTRAMUSCULAR | Status: AC
Start: 2022-12-28 — End: 2022-12-28
  Administered 2022-12-28: 10 mg via INTRAMUSCULAR

## 2022-12-28 NOTE — Patient Instructions (Signed)
Atopic Dermatitis ?Atopic dermatitis is a skin disorder that causes inflammation of the skin. It is marked by a red rash and itchy, dry, scaly skin. It is the most common type of eczema. Eczema is a group of skin conditions that cause the skin to become rough and swollen. This condition is generally worse during the cooler winter months and often improves during the warm summer months. ?Atopic dermatitis usually starts showing signs in infancy and can last through adulthood. This condition cannot be passed from one person to another (is not contagious). Atopic dermatitis may not always be present, but when it is, it is called a flare-up. ?What are the causes? ?The exact cause of this condition is not known. Flare-ups may be triggered by: ?Coming in contact with something that you are sensitive or allergic to (allergen). ?Stress. ?Certain foods. ?Extremely hot or cold weather. ?Harsh chemicals and soaps. ?Dry air. ?Chlorine. ?What increases the risk? ?This condition is more likely to develop in people who have a personal or family history of: ?Eczema. ?Allergies. ?Asthma. ?Hay fever. ?What are the signs or symptoms? ?Symptoms of this condition include: ?Dry, scaly skin. ?Red, itchy rash. ?Itchiness, which can be severe. This may occur before the skin rash. This can make sleeping difficult. ?Skin thickening and cracking that can occur over time. ?How is this diagnosed? ?This condition is diagnosed based on: ?Your symptoms. ?Your medical history. ?A physical exam. ?How is this treated? ?There is no cure for this condition, but symptoms can usually be controlled. Treatment focuses on: ?Controlling the itchiness and scratching. You may be given medicines, such as antihistamines or steroid creams. ?Limiting exposure to allergens. ?Recognizing situations that cause stress and developing a plan to manage stress. ?If your atopic dermatitis does not get better with medicines, or if it is all over your body (widespread), a  treatment using a specific type of light (phototherapy) may be used. ?Follow these instructions at home: ?Skin care ? ?Keep your skin well moisturized. Doing this seals in moisture and helps to prevent dryness. ?Use unscented lotions that have petroleum in them. ?Avoid lotions that contain alcohol or water. They can dry the skin. ?Keep baths or showers short (less than 5 minutes) in warm water. Do not use hot water. ?Use mild, unscented cleansers for bathing. Avoid soap and bubble bath. ?Apply a moisturizer to your skin right after a bath or shower. ?Do not apply anything to your skin without checking with your health care provider. ?General instructions ?Take or apply over-the-counter and prescription medicines only as told by your health care provider. ?Dress in clothes made of cotton or cotton blends. Dress lightly because heat increases itchiness. ?When washing your clothes, rinse your clothes twice so all of the soap is removed. ?Avoid any triggers that can cause a flare-up. ?Keep your fingernails cut short. ?Avoid scratching. Scratching makes the rash and itchiness worse. A break in the skin from scratching could result in a skin infection (impetigo). ?Do not be around people who have cold sores or fever blisters. If you get the infection, it may cause your atopic dermatitis to worsen. ?Keep all follow-up visits. This is important. ?Contact a health care provider if: ?Your itchiness interferes with sleep. ?Your rash gets worse or is not better within one week of starting treatment. ?You have a fever. ?You have a rash flare-up after having contact with someone who has cold sores or fever blisters. ?Get help right away if: ?You develop pus or soft yellow scabs in the rash   area. ?Summary ?Atopic dermatitis causes a red rash and itchy, dry, scaly skin. ?Treatment focuses on controlling the itchiness and scratching, limiting exposure to things that you are sensitive or allergic to (allergens), recognizing  situations that cause stress, and developing a plan to manage stress. ?Keep your skin well moisturized. ?Keep baths or showers shorter than 5 minutes and use warm water. Do not use hot water. ?This information is not intended to replace advice given to you by your health care provider. Make sure you discuss any questions you have with your health care provider. ?Document Revised: 01/12/2020 Document Reviewed: 01/12/2020 ?Elsevier Patient Education ? 2023 [employer] ? ?

## 2022-12-28 NOTE — Progress Notes (Signed)
Assessment and Plan:  Deborah [name] was seen today for an episodic visit .  Diagnoses and all order for this visit:  Dermatitis/unknown allergy  Continue to monitor for any trigger of outbreak Discussed possible allergy to tomatoes or any other foods, substances around the home. Discussed benefit of food/substance log/exposure log in the past 24-48 hours if outbreak occurs again.    - dexamethasone (DECADRON) injection 10 mg - predniSONE (DELTASONE) 10 MG tablet; 1 tab 3 x day for 2 days, then 1 tab 2 x day for 2 days, then 1 tab 1 x day for 3 days  Dispense: 13 tablet; Refill: 0  Generalized rash/pruritus Start daily antihistamine Zyrtec 20 mg sample provided  - dexamethasone (DECADRON) injection 10 mg - predniSONE (DELTASONE) 10 MG tablet; 1 tab 3 x day for 2 days, then 1 tab 2 x day for 2 days, then 1 tab 1 x day for 3 days  Dispense: 13 tablet; Refill: 0   Notify office for further evaluation and treatment, questions or concerns if s/s fail to improve. The risks and benefits of my recommendations, as well as other treatment options were discussed with the patient today. Questions were answered.  Further disposition pending results of labs. Discussed med's effects and SE's.    Over 15 minutes of exam, counseling, chart review, and critical decision making was performed.   Future Appointments  Date Time Provider Department Center  02/06/2023 10:00 AM Adela Glimpse, NP [facility] None  05/15/2023 10:30 AM Lucky Cowboy, MD [facility] None  08/14/2023 10:00 AM Adela Glimpse, NP [facility] None  11/13/2023  3:00 PM Lucky Cowboy, MD [facility] None    ------------------------------------------------------------------------------------------------------------------   HPI BP 102/70   Pulse 89   Temp 97.6 F (36.4 C)   Ht 5' 5.5" (1.664 m)   Wt 132 lb 3.2 oz (60 kg)   SpO2 99%   BMI 21.66 kg/m   77 y.o.female presents for evaluation of dermatitis reoccurring  rash.  Last seen in UC on 10/17/22 for generalized rash along inner thighs, torso and BUE.  She was treated with prednisone and rash improved.  She has noticed the same rash occur of the last 3 days. Associated itching.  No pustule, describes as flat and dry.  Denies dermatomal pattern or centralized dotting.  Feels as though this occurred the same time last year.  State she may have allergy to tomatoes.  She denies any new recent medications, supplements, foods, soaps, lotions, clothing, pets or travel.  She has not beet taking any medications or applying topical creams.  Denies recent URI.  Denies fever, chills, fatigue.    Past Medical History:  Diagnosis Date   Allergy    Anemia    Anxiety    Aortic atherosclerosis (HCC) 11/2016   Noted on CT abd   Arthritis    hands and knees   Bilateral cataracts    History of   Cellulitis    Right finger   Cerebral amyloid angiopathy (CODE)    Cervical spondylosis 12/2008   Noted on Cervical spine   Chronic nausea    Compression fracture of T12 vertebra (HCC) 06/07/2017   Mild   DDD (degenerative disc disease), cervical 12/2008   Noted on Cervical spine   Delusions (HCC) 03/24/2018   Depression    Encephalopathy 01/02/2017   Mild, [place] [facility]   GERD (gastroesophageal reflux disease)    Helicobacter pylori infection 2018   History of appendicitis    age 57   History  of colon polyps    History of confusion    History of hiatal hernia 10/16/2016   Noted on Endoscopy   Hyperlipidemia    Incontinence in female 08/06/2014   Internal hemorrhoid 08/02/2017   Noted on Colonoscopy   Low blood pressure reading    Migraines    Osteopenia    PONV (postoperative nausea and vomiting)    Pre-diabetes    RLS (restless legs syndrome)    Seizure (HCC)    atypical partial comples vs. confusional migraines   Seizure disorder (HCC) 12/15/2016   Skin cancer    right lower eyelid   Squamous cell skin cancer    history of neck and upper back   Syncope  03/25/2018   TIA (transient ischemic attack)    questionable per patient   Vitamin D deficiency      Allergies  Allergen Reactions   Rizatriptan Nausea Only   Banana Nausea And Vomiting   Banana Extract Allergy Skin Test Other (See Comments)   Chocolate Other (See Comments)    Unknown   Guaifenesin-Codeine Other (See Comments)   Maxalt [Rizatriptan Benzoate] Nausea Only and Other (See Comments)    Nausea and excessive salivation   Other Other (See Comments)    NUTS (all kinds)   Venlafaxine Other (See Comments)    insomnia  Other Reaction(s): Not available   Tussin [Guaifenesin] Rash   Zostavax [Zoster Vaccine Live] Itching and Rash    Current Outpatient Medications on File Prior to Visit  Medication Sig   alendronate (FOSAMAX) 70 MG tablet Take      1 tablet      every 7 days       with a full glass of water       on an empty stomach       for 60 minutes   aspirin EC 81 MG tablet Take 1 tablet (81 mg total) by mouth daily.   azelastine (ASTELIN) 0.1 % nasal spray USE 2 SPRAY(S) IN EACH NOSTRIL TWICE DAILY   busPIRone (BUSPAR) 10 MG tablet Take  1 tablet  3 x /day  as needed for Nerves                                                                      /                                                        TAKE                                               BY                                     MOUTH   Cholecalciferol (VITAMIN D3) 125 MCG (5000 UT) CAPS Take 5,000 Units by mouth daily.   diclofenac Sodium (VOLTAREN) 1 %  GEL Apply 2 g topically 4 (four) times daily.   diltiazem (CARDIZEM CD) 120 MG 24 hr capsule Take 1 capsule at Night  for BP & Prevent Migraines   escitalopram (LEXAPRO) 10 MG tablet Take 1 tablet by mouth once daily   ezetimibe (ZETIA) 10 MG tablet Take  1 tablet  Daily for Cholesterol                                                                    /                                     TAKE                                     BY                                             MOUTH                                          ONCE DAILY   Multiple Vitamins-Minerals (MULTIVITAMIN PO) Take 1 tablet by mouth daily.   ondansetron (ZOFRAN-ODT) 4 MG disintegrating tablet DISSOLVE 2 TABLETS IN MOUTH TWICE DAILY AS NEEDED   rosuvastatin (CRESTOR) 40 MG tablet Take  1 tablet   Daily  for Cholesterol   triamcinolone (KENALOG) 0.025 % cream Apply 1 Application topically 2 (two) times daily as needed (Skin rash).   No current facility-administered medications on file prior to visit.    ROS: all negative except what is noted in the HPI.   Physical Exam:  BP 102/70   Pulse 89   Temp 97.6 F (36.4 C)   Ht 5' 5.5" (1.664 m)   Wt 132 lb 3.2 oz (60 kg)   SpO2 99%   BMI 21.66 kg/m   General Appearance: NAD.  Awake, conversant and cooperative. Eyes: PERRLA, EOMs intact.  Sclera white.  Conjunctiva without erythema. Sinuses: No frontal/maxillary tenderness.  No nasal discharge. Nares patent.  ENT/Mouth: Ext aud canals clear.  Bilateral TMs w/DOL and without erythema or bulging. Hearing intact.  Posterior pharynx without swelling or exudate.  Tonsils without swelling or erythema.  Neck: Supple.  No masses, nodules or thyromegaly. Respiratory: Effort is regular with non-labored breathing. Breath sounds are equal bilaterally without rales, rhonchi, wheezing or stridor.  Cardio: RRR with no MRGs. Brisk peripheral pulses without edema.  Abdomen: Active BS in all four quadrants.  Soft and non-tender without guarding, rebound tenderness, hernias or masses. Lymphatics: Non tender without lymphadenopathy.  Musculoskeletal: Full ROM, 5/5 strength, normal ambulation.  No clubbing or cyanosis. Skin: BUE, BLE (inner thighs) torso with scattered flat round regularly shaped macules.  Dry.   Neuro: CN II-XII grossly normal. Normal muscle tone without cerebellar symptoms and intact sensation.   Psych: AO X 3,  appropriate mood and affect, insight  and judgment.     Adela Glimpse, NP 11:41 AM [facility]

## 2023-01-02 DIAGNOSIS — S52502A Unspecified fracture of the lower end of left radius, initial encounter for closed fracture: Secondary | ICD-10-CM | POA: Diagnosis not present

## 2023-01-02 DIAGNOSIS — S62611A Displaced fracture of proximal phalanx of left index finger, initial encounter for closed fracture: Secondary | ICD-10-CM | POA: Diagnosis not present

## 2023-01-10 DIAGNOSIS — S52502P Unspecified fracture of the lower end of left radius, subsequent encounter for closed fracture with malunion: Secondary | ICD-10-CM | POA: Diagnosis not present

## 2023-01-23 DIAGNOSIS — S52502D Unspecified fracture of the lower end of left radius, subsequent encounter for closed fracture with routine healing: Secondary | ICD-10-CM | POA: Diagnosis not present

## 2023-01-30 ENCOUNTER — Ambulatory Visit: Payer: Medicare PPO | Admitting: Nurse Practitioner

## 2023-02-01 ENCOUNTER — Other Ambulatory Visit: Payer: Self-pay | Admitting: Nurse Practitioner

## 2023-02-01 DIAGNOSIS — F411 Generalized anxiety disorder: Secondary | ICD-10-CM

## 2023-02-06 ENCOUNTER — Ambulatory Visit: Payer: Medicare PPO | Admitting: Nurse Practitioner

## 2023-02-06 DIAGNOSIS — S52502D Unspecified fracture of the lower end of left radius, subsequent encounter for closed fracture with routine healing: Secondary | ICD-10-CM | POA: Diagnosis not present

## 2023-02-06 DIAGNOSIS — M25632 Stiffness of left wrist, not elsewhere classified: Secondary | ICD-10-CM | POA: Diagnosis not present

## 2023-02-15 DIAGNOSIS — M25632 Stiffness of left wrist, not elsewhere classified: Secondary | ICD-10-CM | POA: Diagnosis not present

## 2023-02-20 DIAGNOSIS — M25632 Stiffness of left wrist, not elsewhere classified: Secondary | ICD-10-CM | POA: Diagnosis not present

## 2023-02-27 DIAGNOSIS — M25632 Stiffness of left wrist, not elsewhere classified: Secondary | ICD-10-CM | POA: Diagnosis not present

## 2023-02-27 DIAGNOSIS — S52502D Unspecified fracture of the lower end of left radius, subsequent encounter for closed fracture with routine healing: Secondary | ICD-10-CM | POA: Diagnosis not present

## 2023-03-08 DIAGNOSIS — M25632 Stiffness of left wrist, not elsewhere classified: Secondary | ICD-10-CM | POA: Diagnosis not present

## 2023-03-21 DIAGNOSIS — M25632 Stiffness of left wrist, not elsewhere classified: Secondary | ICD-10-CM | POA: Diagnosis not present

## 2023-03-27 DIAGNOSIS — S52502D Unspecified fracture of the lower end of left radius, subsequent encounter for closed fracture with routine healing: Secondary | ICD-10-CM | POA: Diagnosis not present

## 2023-03-27 DIAGNOSIS — S52602D Unspecified fracture of lower end of left ulna, subsequent encounter for closed fracture with routine healing: Secondary | ICD-10-CM | POA: Diagnosis not present

## 2023-04-22 DIAGNOSIS — Z743 Need for continuous supervision: Secondary | ICD-10-CM | POA: Diagnosis not present

## 2023-04-22 DIAGNOSIS — U071 COVID-19: Secondary | ICD-10-CM | POA: Diagnosis not present

## 2023-04-22 DIAGNOSIS — F32A Depression, unspecified: Secondary | ICD-10-CM | POA: Diagnosis not present

## 2023-04-22 DIAGNOSIS — R4182 Altered mental status, unspecified: Secondary | ICD-10-CM | POA: Diagnosis not present

## 2023-04-22 DIAGNOSIS — A084 Viral intestinal infection, unspecified: Secondary | ICD-10-CM | POA: Diagnosis not present

## 2023-04-22 DIAGNOSIS — J1282 Pneumonia due to coronavirus disease 2019: Secondary | ICD-10-CM | POA: Diagnosis not present

## 2023-04-22 DIAGNOSIS — Z79899 Other long term (current) drug therapy: Secondary | ICD-10-CM | POA: Diagnosis not present

## 2023-04-22 DIAGNOSIS — J1289 Other viral pneumonia: Secondary | ICD-10-CM | POA: Diagnosis not present

## 2023-04-22 DIAGNOSIS — R55 Syncope and collapse: Secondary | ICD-10-CM | POA: Diagnosis not present

## 2023-04-22 DIAGNOSIS — B9729 Other coronavirus as the cause of diseases classified elsewhere: Secondary | ICD-10-CM | POA: Diagnosis not present

## 2023-04-22 DIAGNOSIS — E785 Hyperlipidemia, unspecified: Secondary | ICD-10-CM | POA: Diagnosis not present

## 2023-04-23 DIAGNOSIS — A084 Viral intestinal infection, unspecified: Secondary | ICD-10-CM | POA: Diagnosis not present

## 2023-04-23 DIAGNOSIS — R55 Syncope and collapse: Secondary | ICD-10-CM | POA: Diagnosis not present

## 2023-04-23 DIAGNOSIS — F32A Depression, unspecified: Secondary | ICD-10-CM | POA: Diagnosis not present

## 2023-04-23 DIAGNOSIS — J1282 Pneumonia due to coronavirus disease 2019: Secondary | ICD-10-CM | POA: Diagnosis not present

## 2023-04-23 DIAGNOSIS — U071 COVID-19: Secondary | ICD-10-CM | POA: Diagnosis not present

## 2023-04-23 DIAGNOSIS — E785 Hyperlipidemia, unspecified: Secondary | ICD-10-CM | POA: Diagnosis not present

## 2023-04-24 DIAGNOSIS — J1282 Pneumonia due to coronavirus disease 2019: Secondary | ICD-10-CM | POA: Diagnosis not present

## 2023-04-24 DIAGNOSIS — E785 Hyperlipidemia, unspecified: Secondary | ICD-10-CM | POA: Diagnosis not present

## 2023-04-24 DIAGNOSIS — R55 Syncope and collapse: Secondary | ICD-10-CM | POA: Diagnosis not present

## 2023-04-24 DIAGNOSIS — U071 COVID-19: Secondary | ICD-10-CM | POA: Diagnosis not present

## 2023-04-24 DIAGNOSIS — F32A Depression, unspecified: Secondary | ICD-10-CM | POA: Diagnosis not present

## 2023-04-24 DIAGNOSIS — A084 Viral intestinal infection, unspecified: Secondary | ICD-10-CM | POA: Diagnosis not present

## 2023-05-08 DIAGNOSIS — S52602D Unspecified fracture of lower end of left ulna, subsequent encounter for closed fracture with routine healing: Secondary | ICD-10-CM | POA: Diagnosis not present

## 2023-05-08 DIAGNOSIS — S52502A Unspecified fracture of the lower end of left radius, initial encounter for closed fracture: Secondary | ICD-10-CM | POA: Diagnosis not present

## 2023-05-15 ENCOUNTER — Ambulatory Visit: Payer: Medicare PPO | Admitting: Internal Medicine

## 2023-07-24 ENCOUNTER — Ambulatory Visit: Payer: Medicare PPO | Admitting: Nurse Practitioner

## 2023-07-26 ENCOUNTER — Encounter: Payer: Self-pay | Admitting: *Deleted

## 2023-07-26 ENCOUNTER — Ambulatory Visit
Admission: EM | Admit: 2023-07-26 | Discharge: 2023-07-26 | Disposition: A | Discharge status: Home / Self Care | Attending: Family Medicine | Admitting: Family Medicine

## 2023-07-26 ENCOUNTER — Other Ambulatory Visit: Payer: Self-pay

## 2023-07-26 DIAGNOSIS — K219 Gastro-esophageal reflux disease without esophagitis: Secondary | ICD-10-CM | POA: Diagnosis not present

## 2023-07-26 DIAGNOSIS — R1084 Generalized abdominal pain: Secondary | ICD-10-CM

## 2023-07-26 DIAGNOSIS — R110 Nausea: Secondary | ICD-10-CM | POA: Diagnosis not present

## 2023-07-26 DIAGNOSIS — K30 Functional dyspepsia: Secondary | ICD-10-CM | POA: Diagnosis not present

## 2023-07-26 LAB — POCT URINALYSIS DIP (MANUAL ENTRY)
Bilirubin, UA: NEGATIVE
Blood, UA: NEGATIVE
Glucose, UA: NEGATIVE mg/dL
Nitrite, UA: NEGATIVE
Protein Ur, POC: NEGATIVE mg/dL
Spec Grav, UA: 1.02
Urobilinogen, UA: 0.2 U/dL
pH, UA: 5.5

## 2023-07-26 MED ORDER — METOCLOPRAMIDE HCL 10 MG PO TABS: 10.0000 mg | ORAL_TABLET | Freq: Three times a day (TID) | ORAL | 0 refills | Status: DC | PRN

## 2023-07-26 MED ORDER — FAMOTIDINE 20 MG PO TABS: 20.0000 mg | ORAL_TABLET | Freq: Two times a day (BID) | ORAL | 0 refills | Status: DC

## 2023-07-26 NOTE — ED Provider Notes (Signed)
 [facility]    CSN: [id number] Arrival date & time: 07/26/23  1459      History   Chief Complaint Chief Complaint  Patient presents with   Abdominal Pain    HPI Deborah [name] is a 78 y.o. female, anxiety, anemia, GERD. Patient presents for evaluation of two weeks of generalized abdominal pain with recurrent nausea. Patient denies diarrhea or actual vomiting. Symptoms are exacerbated by eating as nausea and abdominal pain worsens with eating. She has a history of GERD not currently taking medication. Denies any changes in appetite or bowel habits. Denies dysuria. No fever or bloody stool. She has an appointment with her new PCP on 08/21/2023 however, did not want to wait to until then to be seen. Past Medical History:  Diagnosis Date   Allergy    Anemia    Anxiety    Aortic atherosclerosis (HCC) 11/2016   Noted on CT abd   Arthritis    hands and knees   Bilateral cataracts    History of   Cellulitis    Right finger   Cerebral amyloid angiopathy (CODE)    Cervical spondylosis 12/2008   Noted on Cervical spine   Chronic nausea    Compression fracture of T12 vertebra (HCC) 06/07/2017   Mild   DDD (degenerative disc disease), cervical 12/2008   Noted on Cervical spine   Delusions (HCC) 03/24/2018   Depression    Encephalopathy 01/02/2017   Mild, [facility]   GERD (gastroesophageal reflux disease)    Helicobacter pylori infection 2018   History of appendicitis    age 67   History of colon polyps    History of confusion    History of hiatal hernia 10/16/2016   Noted on Endoscopy   Hyperlipidemia    Incontinence in female 08/06/2014   Internal hemorrhoid 08/02/2017   Noted on Colonoscopy   Low blood pressure reading    Migraines    Osteopenia    PONV (postoperative nausea and vomiting)    Pre-diabetes    RLS (restless legs syndrome)    Seizure (HCC)    atypical partial comples vs. confusional migraines   Seizure disorder (HCC) 12/15/2016   Skin  cancer    right lower eyelid   Squamous cell skin cancer    history of neck and upper back   Syncope 03/25/2018   TIA (transient ischemic attack)    questionable per patient   Vitamin D deficiency     Patient Active Problem List   Diagnosis Date Noted   Primary osteoarthritis of left knee 03/22/2018   GERD (gastroesophageal reflux disease) 02/25/2018   Osteopenia 02/25/2018   Compression fracture of T12 vertebra (HCC) 05/30/2017   Aortic atherosclerosis (HCC) by Abd CT scan  11/2016 02/03/2017   History of TIA (transient ischemic attack) 12/15/2016   BMI 22.0-22.9, adult 03/12/2015   History of skin cancer 08/06/2014   Migraine 08/06/2014   Generalized anxiety disorder 08/06/2014   Abnormal glucose 07/01/2013   Medication management 07/01/2013   Hypertension    Hyperlipidemia    Vitamin D deficiency     Past Surgical History:  Procedure Laterality Date   APPENDECTOMY  1959   CATARACT EXTRACTION, BILATERAL Bilateral 2014   CESAREAN SECTION  1978   COLONOSCOPY W/ POLYPECTOMY  08/02/2017   KNEE ARTHROSCOPY Bilateral 04/2014   [facility]   MOHS SURGERY Right 04/2013   Squamous cell cacinoma, neck/upper back   SPINE SURGERY  2010   C6-C7 fusion  TOTAL KNEE ARTHROPLASTY Left 03/22/2018   Procedure: LEFT TOTAL KNEE ARTHROPLASTY;  Surgeon: Neil Balls, MD;  Location: [facility];  Service: Orthopedics;  Laterality: Left;  Adductor Block   UPPER GI ENDOSCOPY  10/16/2016    OB History   No obstetric history on file.      Home Medications    Prior to Admission medications   Medication Sig Start Date End Date Taking? Authorizing Provider  alendronate (FOSAMAX) 70 MG tablet Take      1 tablet      every 7 days       with a full glass of water       on an empty stomach       for 60 minutes 04/24/22  Yes Cranford, Lynnie Saucier, NP  aspirin EC 81 MG tablet Take 1 tablet (81 mg total) by mouth daily. 11/30/20  Yes Wendie Hamburg, MD  azelastine (ASTELIN) 0.1 % nasal spray USE  2 SPRAY(S) IN EACH NOSTRIL TWICE DAILY   Yes [provider]  busPIRone (BUSPAR) 10 MG tablet Take  1 tablet  3 x /day  as needed for Nerves                                                                      /                                                        TAKE                                               BY                                     MOUTH 04/19/22  Yes Vangie Genet, MD  Cholecalciferol (VITAMIN D3) 125 MCG (5000 UT) CAPS Take 5,000 Units by mouth daily.   Yes [provider]  diltiazem (CARDIZEM CD) 120 MG 24 hr capsule Take 1 capsule at Night  for BP & Prevent Migraines 04/27/21  Yes Vangie Genet, MD  escitalopram (LEXAPRO) 10 MG tablet Take 1 tablet by mouth once daily 02/02/23  Yes Wilkinson, Dana E, NP  ezetimibe (ZETIA) 10 MG tablet Take  1 tablet  Daily for Cholesterol                                                                    /                                     TAKE  BY                                            MOUTH                                          ONCE DAILY 03/22/22  Yes Vangie Genet, MD  famotidine (PEPCID) 20 MG tablet Take 1 tablet (20 mg total) by mouth 2 (two) times daily for 5 days. 07/26/23 07/31/23 Yes Buena Carmine, NP  metoCLOPramide (REGLAN) 10 MG tablet Take 1 tablet (10 mg total) by mouth 3 (three) times daily with meals as needed for nausea. 07/26/23  Yes Buena Carmine, NP  Multiple Vitamins-Minerals (MULTIVITAMIN PO) Take 1 tablet by mouth daily.   Yes [provider]  rosuvastatin (CRESTOR) 40 MG tablet Take  1 tablet   Daily  for Cholesterol 10/05/22  Yes Cranford, Tonya, NP  diclofenac Sodium (VOLTAREN) 1 % GEL Apply 2 g topically 4 (four) times daily. Patient not taking: Reported on 07/26/2023 06/29/21   Corbett, Ashley, NP  ondansetron (ZOFRAN-ODT) 4 MG disintegrating tablet DISSOLVE 2 TABLETS IN MOUTH TWICE DAILY AS NEEDED Patient not taking: Reported on  07/26/2023    [provider]  predniSONE (DELTASONE) 10 MG tablet 1 tab 3 x day for 2 days, then 1 tab 2 x day for 2 days, then 1 tab 1 x day for 3 days Patient not taking: Reported on 07/26/2023 12/28/22   Cranford, Tonya, NP  triamcinolone (KENALOG) 0.025 % cream Apply 1 Application topically 2 (two) times daily as needed (Skin rash). Patient not taking: Reported on 07/26/2023 10/17/22   Buena Carmine, NP    Family History Family History  Problem Relation Age of Onset   Diabetes Mother    Heart disease Mother    Heart disease Father    Stroke Father    Hypertension Father    Hyperlipidemia Sister    Hypertension Brother     Social History Social History   Tobacco Use   Smoking status: Former    Current packs/day: 0.00    Average packs/day: 1 pack/day for 4.0 years (4.0 ttl pk-yrs)    Types: Cigarettes    Start date: 03/25/1982    Quit date: 03/25/1986    Years since quitting: 37.3   Smokeless tobacco: Never  Vaping Use   Vaping status: Never Used  Substance Use Topics   Alcohol use: Not Currently   Drug use: No     Allergies   Rizatriptan, Banana, Banana extract allergy skin test, Chocolate, Guaifenesin-codeine, Maxalt [rizatriptan benzoate], Other, Venlafaxine, Tussin [guaifenesin], and Zostavax [zoster vaccine live]   Review of Systems Review of Systems  Gastrointestinal:  Positive for abdominal pain.     Physical Exam Triage Vital Signs ED Triage Vitals  Encounter Vitals Group     BP 07/26/23 1540 119/89     Systolic BP Percentile --      Diastolic BP Percentile --      Pulse Rate 07/26/23 1540 96     Resp 07/26/23 1540 16     Temp 07/26/23 1540 98.4 F (36.9 C)     Temp Source 07/26/23 1540 Oral     SpO2 07/26/23 1540 99 %     Weight --  Height --      Head Circumference --      Peak Flow --      Pain Score 07/26/23 1535 4     Pain Loc --      Pain Education --      Exclude from Growth Chart --    No data found.  Updated Vital  Signs BP 119/89 (BP Location: Right Arm)   Pulse 96   Temp 98.4 F (36.9 C) (Oral)   Resp 16   SpO2 99%   Visual Acuity Right Eye Distance:   Left Eye Distance:   Bilateral Distance:    Right Eye Near:   Left Eye Near:    Bilateral Near:     Physical Exam Vitals reviewed.  Constitutional:      General: She is not in acute distress.    Appearance: She is well-developed. She is not ill-appearing.  HENT:     Head: Normocephalic and atraumatic.  Eyes:     Extraocular Movements: Extraocular movements intact.     Pupils: Pupils are equal, round, and reactive to light.  Cardiovascular:     Rate and Rhythm: Normal rate and regular rhythm.  Pulmonary:     Effort: Pulmonary effort is normal.     Breath sounds: Normal breath sounds.  Abdominal:     General: Abdomen is flat. Bowel sounds are normal. There is no distension.     Palpations: Abdomen is soft. There is no hepatomegaly or mass.     Tenderness: There is no abdominal tenderness. There is no right CVA tenderness, left CVA tenderness, guarding or rebound.  Skin:    General: Skin is warm.     Capillary Refill: Capillary refill takes less than 2 seconds.  Neurological:     General: No focal deficit present.     Mental Status: She is alert.      UC Treatments / Results  Labs (all labs ordered are listed, but only abnormal results are displayed) Labs Reviewed  POCT URINALYSIS DIP (MANUAL ENTRY) - Abnormal; Notable for the following components:      Result Value   Ketones, POC UA trace (5) (*)    Leukocytes, UA Trace (*)    All other components within normal limits    EKG   Radiology No results found.  Procedures Procedures (including critical care time)  Medications Ordered in UC Medications - No data to display  Initial Impression / Assessment and Plan / UC Course  I have reviewed the triage vital signs and the nursing notes.  Pertinent labs & imaging results that were available during my care of the  patient were reviewed by me and considered in my medical decision making (see chart for details).    Generalized Abdominal pain with acid indigestion, GERD with nausea without vomiting, -Suspect current symptoms are related to exacerbation of GERD. Low suspicion for acute abdomen due to length of time symptoms have been present, no reproducible abdominal tenderness.  ED precautions given if symptoms worsens. Discussed treatment with acid reducer and Reglan PRN. Strict return precautions  given if symptoms worsen or do not improve. Final Clinical Impressions(s) / UC Diagnoses   Final diagnoses:  Generalized abdominal pain  Acid indigestion  Gastroesophageal reflux disease without esophagitis  Nausea without vomiting     Discharge Instructions      As discussed suspect you are having some secondary symptoms related to a flareup of acid reflux.  Start famotidine 20 mg twice daily for 5 days.  Take Reglan 3 times daily before meals as needed for nausea.  If at any point your symptoms worsen or have not resolved after completing treatment go to the nearest emergency department for further workup and evaluation.     ED Prescriptions     Medication Sig Dispense Auth. Provider   famotidine (PEPCID) 20 MG tablet Take 1 tablet (20 mg total) by mouth 2 (two) times daily for 5 days. 10 tablet Buena Carmine, NP   metoCLOPramide (REGLAN) 10 MG tablet Take 1 tablet (10 mg total) by mouth 3 (three) times daily with meals as needed for nausea. 30 tablet Buena Carmine, NP      PDMP not reviewed this encounter.   Buena Carmine, NP 07/29/23 1235

## 2023-07-26 NOTE — Discharge Instructions (Signed)
 As discussed suspect you are having some secondary symptoms related to a flareup of acid reflux.  Start famotidine 20 mg twice daily for 5 days.  Take Reglan 3 times daily before meals as needed for nausea.  If at any point your symptoms worsen or have not resolved after completing treatment go to the nearest emergency department for further workup and evaluation.

## 2023-07-26 NOTE — ED Triage Notes (Signed)
 Pt states her PCP passed away and she has been unable to get in with her new PCP. She reports abdominal pain and nausea x 3 weeks. Denies diarrhea, denies urinary symptoms. States the pain starts after she eats breakfast and usually resolves but returns after she eats dinner. No relief from antacids. States sx are worsening.

## 2023-08-14 ENCOUNTER — Ambulatory Visit: Payer: Medicare PPO | Admitting: Nurse Practitioner

## 2023-08-21 ENCOUNTER — Ambulatory Visit: Payer: Medicare PPO | Admitting: Family Medicine

## 2023-08-21 ENCOUNTER — Encounter: Payer: Self-pay | Admitting: Family Medicine

## 2023-08-21 VITALS — BP 122/80 | HR 87 | Ht 65.5 in | Wt 134.1 lb

## 2023-08-21 DIAGNOSIS — F411 Generalized anxiety disorder: Secondary | ICD-10-CM

## 2023-08-21 DIAGNOSIS — M810 Age-related osteoporosis without current pathological fracture: Secondary | ICD-10-CM

## 2023-08-21 DIAGNOSIS — Z1212 Encounter for screening for malignant neoplasm of rectum: Secondary | ICD-10-CM

## 2023-08-21 DIAGNOSIS — R7309 Other abnormal glucose: Secondary | ICD-10-CM

## 2023-08-21 DIAGNOSIS — Z7689 Persons encountering health services in other specified circumstances: Secondary | ICD-10-CM

## 2023-08-21 DIAGNOSIS — Z1211 Encounter for screening for malignant neoplasm of colon: Secondary | ICD-10-CM

## 2023-08-21 DIAGNOSIS — K219 Gastro-esophageal reflux disease without esophagitis: Secondary | ICD-10-CM | POA: Diagnosis not present

## 2023-08-21 DIAGNOSIS — I10 Essential (primary) hypertension: Secondary | ICD-10-CM | POA: Diagnosis not present

## 2023-08-21 DIAGNOSIS — E559 Vitamin D deficiency, unspecified: Secondary | ICD-10-CM

## 2023-08-21 DIAGNOSIS — E785 Hyperlipidemia, unspecified: Secondary | ICD-10-CM

## 2023-08-21 MED ORDER — ALENDRONATE SODIUM 70 MG PO TABS: 70.0000 mg | ORAL_TABLET | ORAL | 1 refills | Status: DC

## 2023-08-21 MED ORDER — ESCITALOPRAM OXALATE 10 MG PO TABS: 10.0000 mg | ORAL_TABLET | Freq: Every day | ORAL | 0 refills | Status: DC

## 2023-08-21 NOTE — Progress Notes (Signed)
 New Patient Office Visit  Subjective   Patient ID: Deborah [name], female    DOB: 1945/10/04  Age: 78 y.o. MRN: [id number]  CC: No chief complaint on file.   HPI [name] presents to establish care  Subjective - Panic attacks occurring multiple times daily, lasting a few minutes, with periods of remission between episodes - History of anxiety and depression, previously managed with escitalopram  (Lexapro ) 10mg  - Reports stopping Lexapro  when symptoms improved, now requesting restart due to increased panic attacks - No migraines currently, reports they have resolved after years of occurrence - No seizures reported  Medications: vitamin D3, B12, multivitamin, baby aspirin , rosuvastatin  (Crestor ) for cholesterol, previously on ezetimibe  (Zetia ) but not filled since December 2023, previously on alendronate  (Fosamax ) for osteoporosis and would like to restart, previously on diltiazem  for migraines which have resolved, previously on buspirone  (Buspar ) for anxiety during periods of panic attacks.  PMH: hypertension, hypercholesterolemia, anxiety, GERD, osteoporosis, history of migraines (resolved) PSH: appendectomy, C-section, knee replacement, colonoscopy, EGD, cataract surgery, Mohs surgery, C6-C7 spinal fusion FH: Not mentioned Social Hx: Retired Psychologist, forensic, also taught some college courses in summer, married, alcohol use limited to sharing a Medical sales representative with husband approximately once weekly, denies tobacco use  ROS: Positive for anxiety symptoms with multiple daily panic attacks   Outpatient Encounter Medications as of 08/21/2023  Medication Sig   aspirin  EC 81 MG tablet Take 1 tablet (81 mg total) by mouth daily.   Cholecalciferol  (VITAMIN D3) 125 MCG (5000 UT) CAPS Take 5,000 Units by mouth daily.   ezetimibe  (ZETIA ) 10 MG tablet Take  1 tablet  Daily for Cholesterol                                                                    /                                      TAKE                                     BY                                            MOUTH                                          ONCE DAILY   Multiple Vitamins-Minerals (MULTIVITAMIN PO) Take 1 tablet by mouth daily.   rosuvastatin  (CRESTOR ) 40 MG tablet Take  1 tablet   Daily  for Cholesterol   [DISCONTINUED] escitalopram  (LEXAPRO ) 10 MG tablet Take 1 tablet by mouth once daily   alendronate  (FOSAMAX ) 70 MG tablet Take 1 tablet (70 mg total) by mouth once a week. Take  with a full glass of water  on the stomach.  Remain upright for 60 minutes after.   azelastine  (ASTELIN ) 0.1 % nasal  spray USE 2 SPRAY(S) IN EACH NOSTRIL TWICE DAILY (Patient not taking: Reported on 08/21/2023)   busPIRone  (BUSPAR ) 10 MG tablet Take  1 tablet  3 x /day  as needed for Nerves                                                                      /                                                        TAKE                                               BY                                     MOUTH (Patient not taking: Reported on 08/21/2023)   diclofenac  Sodium (VOLTAREN ) 1 % GEL Apply 2 g topically 4 (four) times daily. (Patient not taking: Reported on 08/21/2023)   diltiazem  (CARDIZEM  CD) 120 MG 24 hr capsule Take 1 capsule at Night  for BP & Prevent Migraines (Patient not taking: Reported on 08/21/2023)   escitalopram  (LEXAPRO ) 10 MG tablet Take 1 tablet (10 mg total) by mouth daily.   famotidine  (PEPCID ) 20 MG tablet Take 1 tablet (20 mg total) by mouth 2 (two) times daily for 5 days.   metoCLOPramide  (REGLAN ) 10 MG tablet Take 1 tablet (10 mg total) by mouth 3 (three) times daily with meals as needed for nausea. (Patient not taking: Reported on 08/21/2023)   ondansetron  (ZOFRAN -ODT) 4 MG disintegrating tablet DISSOLVE 2 TABLETS IN MOUTH TWICE DAILY AS NEEDED (Patient not taking: Reported on 08/21/2023)   predniSONE  (DELTASONE ) 10 MG tablet 1 tab 3 x day for 2 days, then 1 tab 2 x day for 2 days, then 1 tab 1 x day for 3 days  (Patient not taking: Reported on 08/21/2023)   triamcinolone  (KENALOG ) 0.025 % cream Apply 1 Application topically 2 (two) times daily as needed (Skin rash). (Patient not taking: Reported on 08/21/2023)   [DISCONTINUED] alendronate  (FOSAMAX ) 70 MG tablet Take      1 tablet      every 7 days       with a full glass of water        on an empty stomach       for 60 minutes (Patient not taking: Reported on 08/21/2023)   No facility-administered encounter medications on file as of 08/21/2023.    Past Medical History:  Diagnosis Date   Allergy    Anemia    Anxiety    Aortic atherosclerosis (HCC) 11/2016   Noted on CT abd   Arthritis    hands and knees   Bilateral cataracts    History of   Cellulitis    Right finger   Cerebral amyloid angiopathy (CODE)    Cervical spondylosis 12/2008  Noted on Cervical spine   Chronic nausea    Compression fracture of T12 vertebra (HCC) 06/07/2017   Mild   DDD (degenerative disc disease), cervical 12/2008   Noted on Cervical spine   Delusions (HCC) 03/24/2018   Depression    Encephalopathy 01/02/2017   Mild, [facility]   GERD (gastroesophageal reflux disease)    Helicobacter pylori infection 2018   History of appendicitis    age 37   History of colon polyps    History of confusion    History of hiatal hernia 10/16/2016   Noted on Endoscopy   Hyperlipidemia    Incontinence in female 08/06/2014   Internal hemorrhoid 08/02/2017   Noted on Colonoscopy   Low blood pressure reading    Migraines    Osteopenia    PONV (postoperative nausea and vomiting)    Pre-diabetes    RLS (restless legs syndrome)    Seizure (HCC)    atypical partial comples vs. confusional migraines   Seizure disorder (HCC) 12/15/2016   Skin cancer    right lower eyelid   Squamous cell skin cancer    history of neck and upper back   Syncope 03/25/2018   TIA (transient ischemic attack)    questionable per patient   Vitamin D  deficiency     Past Surgical History:  Procedure  Laterality Date   APPENDECTOMY  1959   CATARACT EXTRACTION, BILATERAL Bilateral 2014   CESAREAN SECTION  1978   COLONOSCOPY W/ POLYPECTOMY  08/02/2017   KNEE ARTHROSCOPY Bilateral 04/2014   [facility]   MOHS SURGERY Right 04/2013   Squamous cell cacinoma, neck/upper back   SPINE SURGERY  2010   C6-C7 fusion   TOTAL KNEE ARTHROPLASTY Left 03/22/2018   Procedure: LEFT TOTAL KNEE ARTHROPLASTY;  Surgeon: Neil Balls, MD;  Location: [facility];  Service: Orthopedics;  Laterality: Left;  Adductor Block   UPPER GI ENDOSCOPY  10/16/2016    Family History  Problem Relation Age of Onset   Diabetes Mother    Heart disease Mother    Heart disease Father    Stroke Father    Hypertension Father    Hyperlipidemia Sister    Hypertension Brother     Social History   Socioeconomic History   Marital status: Married    Spouse name: Mara [place]in[place]   Number of children: Not on file   Years of education: Not on file   Highest education level: Not on file  Occupational History   Not on file  Tobacco Use   Smoking status: Former    Current packs/day: 0.00    Average packs/day: 1 pack/day for 4.0 years (4.0 ttl pk-yrs)    Types: Cigarettes    Start date: 03/25/1982    Quit date: 03/25/1986    Years since quitting: 37.4   Smokeless tobacco: Never  Vaping Use   Vaping status: Never Used  Substance and Sexual Activity   Alcohol use: Not Currently   Drug use: No   Sexual activity: Not on file  Other Topics Concern   Not on file  Social History Narrative   Lives with husband   Social Drivers of Corporate investment banker Strain: Not on file  Food Insecurity: Not on file  Transportation Needs: Not on file  Physical Activity: Not on file  Stress: Not on file  Social Connections: Not on file  Intimate Partner Violence: Not on file    ROS     Objective   BP 122/80  Pulse 87   Ht 5' 5.5" (1.664 m)   Wt 134 lb 1.3 oz (60.8 kg)   SpO2 99%   BMI 21.97 kg/m   Physical  Exam General: Alert, oriented HEENT: PERRLA, EOMI CV: Regular rhythm Pulmonary: Clear bilaterally GI: Soft, nontender MSK: Strength equal bilaterally Psych: Pleasant affect, spontaneous speech, good eye contact.      Assessment & Plan:   Encounter to establish care  Age-related osteoporosis without current pathological fracture Assessment & Plan: Resending in Fosamax .  Orders: -     Alendronate  Sodium; Take 1 tablet (70 mg total) by mouth once a week. Take  with a full glass of water  on the stomach.  Remain upright for 60 minutes after.  Dispense: 12 tablet; Refill: 1  Generalized anxiety disorder Assessment & Plan: Restarting escitalopram  10 mg.  Orders: -     CBC with Differential/Platelet -     Escitalopram  Oxalate; Take 1 tablet (10 mg total) by mouth daily.  Dispense: 90 tablet; Refill: 0  Primary hypertension -     CBC with Differential/Platelet  Gastroesophageal reflux disease without esophagitis  Abnormal glucose -     Hemoglobin A1c  Hyperlipidemia, unspecified hyperlipidemia type -     Comprehensive metabolic panel with GFR -     Lipid panel  Vitamin D  deficiency -     Comprehensive metabolic panel with GFR -     VITAMIN D  25 Hydroxy (Vit-D Deficiency, Fractures)  Encounter for colorectal cancer screening -     Ambulatory referral to Gastroenterology  Osteoporosis without current pathological fracture, unspecified osteoporosis type Assessment & Plan: Resending in Fosamax .     No follow-ups on file.   Laneta Pintos, MD

## 2023-08-21 NOTE — Assessment & Plan Note (Signed)
 Restarting escitalopram  10 mg.

## 2023-08-21 NOTE — Assessment & Plan Note (Signed)
 Resending in Fosamax .

## 2023-08-21 NOTE — Patient Instructions (Signed)
 It was nice to see you today,  We addressed the following topics today: -I am sending in the Fosamax  and the escitalopram . - When getting some lab test including cholesterol levels.  We will discuss them when I get the results - I will follow-up with you in 1 month. - I will send in a referral for colonoscopy.  Someone will call you to schedule this.  Have a great day,  Etha Henle, MD

## 2023-08-22 ENCOUNTER — Encounter: Payer: Self-pay | Admitting: Family Medicine

## 2023-08-22 LAB — CBC WITH DIFFERENTIAL/PLATELET
Basophils Absolute: 0.1 10*3/uL (ref 0.0–0.2)
Basos: 1 %
EOS (ABSOLUTE): 0.1 10*3/uL (ref 0.0–0.4)
Eos: 1 %
Hematocrit: 37.9 % (ref 34.0–46.6)
Hemoglobin: 12.8 g/dL (ref 11.1–15.9)
Immature Grans (Abs): 0 10*3/uL (ref 0.0–0.1)
Immature Granulocytes: 0 %
Lymphocytes Absolute: 2.2 10*3/uL (ref 0.7–3.1)
Lymphs: 30 %
MCH: 29.9 pg (ref 26.6–33.0)
MCHC: 33.8 g/dL (ref 31.5–35.7)
MCV: 89 fL (ref 79–97)
Monocytes Absolute: 0.8 10*3/uL (ref 0.1–0.9)
Monocytes: 11 %
Neutrophils Absolute: 4.1 10*3/uL (ref 1.4–7.0)
Neutrophils: 57 %
Platelets: 366 10*3/uL (ref 150–450)
RBC: 4.28 x10E6/uL (ref 3.77–5.28)
RDW: 12.4 % (ref 11.7–15.4)
WBC: 7.4 10*3/uL (ref 3.4–10.8)

## 2023-08-22 LAB — LIPID PANEL
Chol/HDL Ratio: 2.2 ratio (ref 0.0–4.4)
Cholesterol, Total: 156 mg/dL (ref 100–199)
HDL: 72 mg/dL (ref >39)
LDL Chol Calc (NIH): 71 mg/dL (ref 0–99)
Triglycerides: 67 mg/dL (ref 0–149)
VLDL Cholesterol Cal: 13 mg/dL (ref 5–40)

## 2023-08-22 LAB — COMPREHENSIVE METABOLIC PANEL WITH GFR
ALT: 14 IU/L (ref 0–32)
AST: 26 IU/L (ref 0–40)
Albumin: 4.5 g/dL (ref 3.8–4.8)
Alkaline Phosphatase: 86 IU/L (ref 44–121)
BUN/Creatinine Ratio: 15 (ref 12–28)
BUN: 12 mg/dL (ref 8–27)
Bilirubin Total: 0.6 mg/dL (ref 0.0–1.2)
CO2: 23 mmol/L (ref 20–29)
Calcium: 9.8 mg/dL (ref 8.7–10.3)
Chloride: 98 mmol/L (ref 96–106)
Creatinine, Ser: 0.78 mg/dL (ref 0.57–1.00)
Globulin, Total: 2.6 g/dL (ref 1.5–4.5)
Glucose: 88 mg/dL (ref 70–99)
Potassium: 4.2 mmol/L (ref 3.5–5.2)
Sodium: 136 mmol/L (ref 134–144)
Total Protein: 7.1 g/dL (ref 6.0–8.5)
eGFR: 78 mL/min/{1.73_m2} (ref >59)

## 2023-08-22 LAB — HEMOGLOBIN A1C
Est. average glucose Bld gHb Est-mCnc: 120 mg/dL
Hgb A1c MFr Bld: 5.8 % — ABNORMAL HIGH (ref 4.8–5.6)

## 2023-08-22 LAB — VITAMIN D 25 HYDROXY (VIT D DEFICIENCY, FRACTURES): Vit D, 25-Hydroxy: 94.3 ng/mL (ref 30.0–100.0)

## 2023-09-19 ENCOUNTER — Other Ambulatory Visit: Payer: Self-pay | Admitting: Family Medicine

## 2023-09-19 ENCOUNTER — Telehealth: Payer: Self-pay

## 2023-09-19 DIAGNOSIS — E782 Mixed hyperlipidemia: Secondary | ICD-10-CM

## 2023-09-19 MED ORDER — EZETIMIBE 10 MG PO TABS
ORAL_TABLET | ORAL | 3 refills | Status: AC
Start: 2023-09-19 — End: ?

## 2023-09-19 NOTE — Telephone Encounter (Signed)
 Pt is requesting a refill on: ezetimibe  (ZETIA ) 10 MG tablet [   Pharmacy: [id number] - [place] ([place]), [place] - [address]

## 2023-09-25 ENCOUNTER — Encounter: Payer: Self-pay | Admitting: Family Medicine

## 2023-09-25 ENCOUNTER — Ambulatory Visit: Admitting: Family Medicine

## 2023-09-25 VITALS — BP 113/77 | HR 83 | Ht 65.5 in | Wt 132.1 lb

## 2023-09-25 DIAGNOSIS — R7309 Other abnormal glucose: Secondary | ICD-10-CM

## 2023-09-25 DIAGNOSIS — E782 Mixed hyperlipidemia: Secondary | ICD-10-CM | POA: Diagnosis not present

## 2023-09-25 DIAGNOSIS — E854 Organ-limited amyloidosis: Secondary | ICD-10-CM

## 2023-09-25 DIAGNOSIS — I680 Cerebral amyloid angiopathy: Secondary | ICD-10-CM

## 2023-09-25 DIAGNOSIS — F411 Generalized anxiety disorder: Secondary | ICD-10-CM | POA: Diagnosis not present

## 2023-09-25 DIAGNOSIS — Z711 Person with feared health complaint in whom no diagnosis is made: Secondary | ICD-10-CM

## 2023-09-25 MED ORDER — ESCITALOPRAM OXALATE 20 MG PO TABS
20.0000 mg | ORAL_TABLET | Freq: Every day | ORAL | 1 refills | Status: AC
Start: 2023-09-25 — End: ?

## 2023-09-25 MED ORDER — ROSUVASTATIN CALCIUM 40 MG PO TABS
ORAL_TABLET | ORAL | 3 refills | Status: AC
Start: 2023-09-25 — End: ?

## 2023-09-25 NOTE — Assessment & Plan Note (Signed)
 Nothing concerning on exam, but pt agreeable to testing at next visit with slums test.

## 2023-09-25 NOTE — Assessment & Plan Note (Signed)
 Last saw neurology in 2023.  Taking rosuvastatin  and zetia .  Cholesterol at goal.  Pt concerned with memory issues.  Agreed to cognitive testing at next visit.

## 2023-09-25 NOTE — Assessment & Plan Note (Signed)
 Discussed otc berberine to help with glucose control.

## 2023-09-25 NOTE — Assessment & Plan Note (Signed)
-   Increased Lexapro  to 20mg     - Recommended contacting insurance for covered therapy providers    - No referral needed for therapy

## 2023-09-25 NOTE — Patient Instructions (Signed)
 It was nice to see you today,  We addressed the following topics today: - Berberine is the over-the-counter supplement that may help your blood sugar levels.  It can be found in most pharmacies or vitamin stores. - I am increasing your Lexapro  to 20 mg.  I would also recommend you call your insurance company to see if they cover therapy for anxiety and if they can provide you a list of providers to reach out to.  You do not need a referral from me for this. - At your next visit we can check cognitive function.  Have a great day,  Etha Henle, MD

## 2023-09-25 NOTE — Progress Notes (Unsigned)
   Established Patient Office Visit  Subjective   Patient ID: Deborah [name], female    DOB: [birth year]  Age: 78 y.o. MRN: [id number]  Chief Complaint  Patient presents with   Medical Management of Chronic Issues    HPI  Pt states lexapro  has not helped improved her panic episodes. She is not taking buspar .  Does not talk with a therapist. Agreeable to increasing this  Pt concerned about mental decline since her TIA a few years ago.  We discussed secondary prevention and cognitive testing.  She is agreeable to cognitive testing in the future.   Pt compliant with her zetia  and crestor .  Need crestor  refill  We discussed otc options for help with controlling blood sugar.   Discussed otc berberine supplement.      The 10-year ASCVD risk score (Arnett DK, et al., 2019) is: 21%  Health Maintenance Due  Topic Date Due   Pneumonia Vaccine 60+ Years old (2 of 2 - PPSV23) 08/06/2015   Colonoscopy  10/29/2022   COVID-19 Vaccine (5 - 2024-25 season) 12/17/2022   Medicare Annual Wellness (AWV)  08/01/2023      Objective:     BP 113/77   Pulse 83   Ht 5' 5.5" (1.664 m)   Wt 132 lb 1.9 oz (59.9 kg)   SpO2 99%   BMI 21.65 kg/m  {Vitals History (Optional):23777}  Physical Exam Gen: alert, oriented.   Pulm: no resp distress Psych: pleasant affect.    No results found for any visits on 09/25/23.      Assessment & Plan:   Abnormal glucose Assessment & Plan: Discussed otc berberine to help with glucose control.     Hyperlipidemia, mixed -     Rosuvastatin  Calcium ; Take  1 tablet   Daily  for Cholesterol  Dispense: 90 tablet; Refill: 3  Generalized anxiety disorder Assessment & Plan:    - Increased Lexapro  to 20mg     - Recommended contacting insurance for covered therapy providers    - No referral needed for therapy  Orders: -     Escitalopram  Oxalate; Take 1 tablet (20 mg total) by mouth daily.  Dispense: 90 tablet; Refill: 1  Concern about memory Assessment &  Plan: Nothing concerning on exam, but pt agreeable to testing at next visit with slums test.   Cerebral amyloid angiopathy [facility]) Assessment & Plan: Last saw neurology in 2023.  Taking rosuvastatin  and zetia .  Cholesterol at goal.  Pt concerned with memory issues.  Agreed to cognitive testing at next visit.        Return in about 2 months (around 11/25/2023) for cognitive function testing.    Laneta Pintos, MD

## 2023-10-25 ENCOUNTER — Encounter: Payer: Self-pay | Admitting: Family Medicine

## 2023-10-30 DIAGNOSIS — Z08 Encounter for follow-up examination after completed treatment for malignant neoplasm: Secondary | ICD-10-CM | POA: Diagnosis not present

## 2023-10-30 DIAGNOSIS — C4492 Squamous cell carcinoma of skin, unspecified: Secondary | ICD-10-CM | POA: Diagnosis not present

## 2023-10-30 DIAGNOSIS — L814 Other melanin hyperpigmentation: Secondary | ICD-10-CM | POA: Diagnosis not present

## 2023-10-30 DIAGNOSIS — D225 Melanocytic nevi of trunk: Secondary | ICD-10-CM | POA: Diagnosis not present

## 2023-10-30 DIAGNOSIS — L821 Other seborrheic keratosis: Secondary | ICD-10-CM | POA: Diagnosis not present

## 2023-10-30 DIAGNOSIS — Z85828 Personal history of other malignant neoplasm of skin: Secondary | ICD-10-CM | POA: Diagnosis not present

## 2023-11-08 ENCOUNTER — Ambulatory Visit: Payer: Self-pay

## 2023-11-08 DIAGNOSIS — N760 Acute vaginitis: Secondary | ICD-10-CM | POA: Diagnosis not present

## 2023-11-08 DIAGNOSIS — R300 Dysuria: Secondary | ICD-10-CM | POA: Diagnosis not present

## 2023-11-08 NOTE — Telephone Encounter (Signed)
 FYI Only or Action Required?: FYI only for provider.  Patient was last seen in primary care on 09/25/2023 by Chandra Toribio POUR, MD.  Called Nurse Triage reporting Urinary Tract Infection.  Symptoms began yesterday.  Interventions attempted: Nothing.  Symptoms are: gradually worsening.  Triage Disposition: See Physician Within 24 Hours  Patient/caregiver understands and will follow disposition?: YesCopied from CRM #[id number]. Topic: Clinical - Red Word Triage >> Nov 08, 2023  1:07 PM Tiffini S wrote: [facility] that prompted transfer to Nurse Triage:  Patient have a UTI and she has pain, different color- very painful Reason for Disposition  Urinating more frequently than usual (i.e., frequency) OR new-onset of the feeling of an urgent need to urinate (i.e., urgency)  Answer Assessment - Initial Assessment Questions 1. SYMPTOM: What's the main symptom you're concerned about? (e.g., frequency, incontinence)     Burning  2. ONSET: When did the    start?     Last night 3. PAIN: Is there any pain? If Yes, ask: How bad is it? (Scale: 1-10; mild, moderate, severe)    8 4. CAUSE: What do you think is causing the symptoms?     UTI 5. OTHER SYMPTOMS: Do you have any other symptoms? (e.g., blood in urine, fever, flank pain, pain with urination)     Frequency and urgency.  Urine color  is cloudy. Pt has not taken anything for the pain. No office appts until 8/5. RN advised UC. Pt stated she will go today.  Protocols used: Urinary Symptoms-A-AH

## 2023-11-08 NOTE — Telephone Encounter (Signed)
 Can you call her to see if she's gone to the urgent care yet and if not, she can just submit a urine sample here.

## 2023-11-08 NOTE — Telephone Encounter (Signed)
 Patient stated that she went to UC  today

## 2023-11-13 ENCOUNTER — Encounter: Payer: Medicare PPO | Admitting: Internal Medicine

## 2023-11-19 DIAGNOSIS — L509 Urticaria, unspecified: Secondary | ICD-10-CM | POA: Diagnosis not present

## 2023-11-24 DIAGNOSIS — N390 Urinary tract infection, site not specified: Secondary | ICD-10-CM | POA: Diagnosis not present

## 2023-11-24 DIAGNOSIS — L309 Dermatitis, unspecified: Secondary | ICD-10-CM | POA: Diagnosis not present

## 2023-11-27 ENCOUNTER — Ambulatory Visit
Admission: RE | Admit: 2023-11-27 | Discharge: 2023-11-27 | Disposition: A | Discharge status: Home / Self Care | Source: Ambulatory Visit | Attending: Family Medicine

## 2023-11-27 ENCOUNTER — Encounter: Payer: Self-pay | Admitting: Family Medicine

## 2023-11-27 ENCOUNTER — Ambulatory Visit: Admitting: Family Medicine

## 2023-11-27 VITALS — BP 127/82 | HR 78 | Ht 65.5 in | Wt 133.1 lb

## 2023-11-27 DIAGNOSIS — M545 Low back pain, unspecified: Secondary | ICD-10-CM | POA: Diagnosis not present

## 2023-11-27 DIAGNOSIS — G8929 Other chronic pain: Secondary | ICD-10-CM | POA: Diagnosis not present

## 2023-11-27 DIAGNOSIS — N3000 Acute cystitis without hematuria: Secondary | ICD-10-CM | POA: Diagnosis not present

## 2023-11-27 DIAGNOSIS — L509 Urticaria, unspecified: Secondary | ICD-10-CM | POA: Diagnosis not present

## 2023-11-27 MED ORDER — PREDNISONE 10 MG PO TABS: ORAL_TABLET | ORAL | 0 refills | Status: AC

## 2023-11-27 MED ORDER — MELOXICAM 15 MG PO TABS: 15.0000 mg | ORAL_TABLET | Freq: Every day | ORAL | 0 refills | Status: DC

## 2023-11-27 MED ORDER — TIZANIDINE HCL 4 MG PO CAPS: 4.0000 mg | ORAL_CAPSULE | Freq: Every evening | ORAL | 0 refills | Status: DC | PRN

## 2023-11-27 NOTE — Progress Notes (Signed)
 Established Patient Office Visit  Subjective   Patient ID: Deborah [name], female    DOB: [birth year]  Age: 78 y.o. MRN: [id number]  Chief Complaint  Patient presents with   Medical Management of Chronic Issues    HPI  Subjective - Low Back Pain   - Onset approximately 3 weeks ago, concurrent with UTI symptoms.   - Describes as more severe than prior episodes of back pain.   - Location is low, midline back.   - No radiation down the legs.   - Worsened by getting out of bed and movement.   - No specific alleviating factors reported.   - Describes as a dull, aching pain.   - Reports pain interferes with sleep.   - Denies fall or injury.  - Urinary Tract Infection   - Reports a persistent UTI.   - Seen at an urgent care in [place]  on Saturday.   - Symptoms included dysuria and some hematuria.   - Started an antibiotic on 11/24/2023.   - Reports urinary symptoms are improving; had pain overnight but none this morning.   - A urine culture was reportedly done in [place] , which confirmed the appropriateness of the prescribed antibiotic.  - Hives   - Onset around the same time as the UTI.   - Generalized distribution.   - Had a similar episode of hives around this time last year.   - Reports hives are somewhat improved with prednisone  but are not resolved.  - Facial Bruising   - Reports bruising face after leaning over to pick something up from bedside table and hitting it against the night stand..   - Denies vision changes.  Medications Currently taking baby aspirin , Zetia , and Crestor . Recently started a course of prednisone  for hives. Was prescribed an antihistamine at a local urgent care prior to trip to [place]  but was advised to stop it. Prescribed meloxicam , Tylenol , and Flexeril  today.  PMH, PSH, FH, Social Hx PMHx: History of intermittent low back pain, recurrent hives (annual episodes for the past two years), hyperlipidemia. Social Hx: Has a residence in [place]  and visits son  there.  ROS Constitutional: Denies fever, chills. GU: Reports improving dysuria and hematuria. Denies new urinary incontinence. MSK: Reports low back pain. Denies leg weakness. Skin: Reports generalized hives and itching.   The 10-year ASCVD risk score (Arnett DK, et al., 2019) is: 25.8%  Health Maintenance Due  Topic Date Due   Pneumococcal Vaccine: 50+ Years (2 of 2 - PCV20 or PCV21) 08/06/2015   Colonoscopy  10/29/2022   COVID-19 Vaccine (5 - 2024-25 season) 12/17/2022   Medicare Annual Wellness (AWV)  08/01/2023   INFLUENZA VACCINE  11/16/2023      Objective:     BP 127/82   Pulse 78   Ht 5' 5.5 (1.664 m)   Wt 133 lb 1.9 oz (60.4 kg)   SpO2 98%   BMI 21.82 kg/m    Physical Exam Objective General: No acute distress. HEENT: Noted bruising on forehead and left eye. NECK: No tenderness to palpation. CV: Regular rate and rhythm. PULM: Clear to auscultation. MSK: Tenderness to palpation over the low, midline lumbar spine. No paravertebral tenderness. Straight leg raise is negative for radicular pain, but elicits back pain. Negative faber and fadir testing.  NEURO: Strength is 5/5 in lower extremities. Sensation is intact.   No results found for any visits on 11/27/23.      Assessment & Plan:   Chronic midline low back pain  without sciatica Assessment & Plan:    Reports severe, dull, aching low back pain for 3 weeks, worse with movement. Examination reveals midline lumbar tenderness without radicular signs. Consistent with mechanical back pain. Differential includes arthritis, discogenic pain, or spinal stenosis.    - Obtain X-ray of the lumbar spine.    - Prescribed meloxicam  15 mg daily.    - Prescribed cyclobenzaprine  (Flexeril ) 10 mg at bedtime for sleep and muscle relaxation.    - Recommend scheduled acetaminophen  (Tylenol ).    - Advised on use of heating pads and topical analgesics.    - Counseled to seek evaluation at an orthopedic urgent care (e.g.,  [facility]) or the emergency department for worsening severe pain, or any new onset leg weakness or urinary/bowel   Orders: -     DG Lumbar Spine Complete; Future  Acute cystitis without hematuria Assessment & Plan:    Diagnosed in [place]  and started on an appropriate antibiotic per urine culture results. Reports symptoms are improving.    - Continue and complete the full course of antibiotics as prescribed.    - Advised to follow up if urinary symptoms persist or recur after finishing the antibiotic course for repeat urinalysis.   Urticaria Assessment & Plan:    Generalized hives ongoing for approximately 3 weeks, occurring around the same time as last year. Partially improved on prednisone .    - Prescribed a prednisone  taper to start after the current course is finished.    - Discontinue previous first-gen antihistamine.    - Recommended over-the-counter Claritin  or Zyrtec for itching, advising it is less sedating than other options.   Other orders -     Meloxicam ; Take 1 tablet (15 mg total) by mouth daily.  Dispense: 30 tablet; Refill: 0 -     tiZANidine  HCl; Take 1 capsule (4 mg total) by mouth at bedtime as needed for muscle spasms.  Dispense: 30 capsule; Refill: 0 -     predniSONE ; Take 3 tablets (30 mg total) by mouth daily with breakfast for 2 days, THEN 2 tablets (20 mg total) daily with breakfast for 2 days, THEN 1 tablet (10 mg total) daily with breakfast for 2 days.  Dispense: 12 tablet; Refill: 0     Return in about 2 months (around 01/27/2024) for low back, bp, hives.    Toribio MARLA Slain, MD

## 2023-11-27 NOTE — Patient Instructions (Signed)
 It was nice to see you today,  We addressed the following topics today: -For your back pain I would like you to do the following: 1.  Take Tylenol  1000 mg every 8 hours at baseline 2.  Take the meloxicam  I prescribed daily 3.  Take the Zanaflex  at night to help with sleep. 4.  Use ice or heat and topical pain medications as needed. - if this does not help please let us  know - You can go to the [facility] on [address] at any time to get the x-rays done of your low back - If your low back pain becomes more severe and want to be seen immediately that a fast pace to go is [facility]'s urgent care at their [address] occasion. - I am extending your prednisone .  Take this after you finish your other course of prednisone .  It is a taper. - You can take over-the-counter Zyrtec or Claritin  for itching from your hives.  Have a great day,  Rolan Slain, MD

## 2023-11-29 DIAGNOSIS — M545 Low back pain, unspecified: Secondary | ICD-10-CM | POA: Insufficient documentation

## 2023-11-29 DIAGNOSIS — L509 Urticaria, unspecified: Secondary | ICD-10-CM | POA: Insufficient documentation

## 2023-11-29 DIAGNOSIS — N3000 Acute cystitis without hematuria: Secondary | ICD-10-CM | POA: Insufficient documentation

## 2023-11-29 NOTE — Assessment & Plan Note (Signed)
 Generalized hives ongoing for approximately 3 weeks, occurring around the same time as last year. Partially improved on prednisone .    - Prescribed a prednisone  taper to start after the current course is finished.    - Discontinue previous first-gen antihistamine.    - Recommended over-the-counter Claritin  or Zyrtec for itching, advising it is less sedating than other options.

## 2023-11-29 NOTE — Assessment & Plan Note (Signed)
 Diagnosed in [place]  and started on an appropriate antibiotic per urine culture results. Reports symptoms are improving.    - Continue and complete the full course of antibiotics as prescribed.    - Advised to follow up if urinary symptoms persist or recur after finishing the antibiotic course for repeat urinalysis.

## 2023-11-29 NOTE — Assessment & Plan Note (Signed)
 Reports severe, dull, aching low back pain for 3 weeks, worse with movement. Examination reveals midline lumbar tenderness without radicular signs. Consistent with mechanical back pain. Differential includes arthritis, discogenic pain, or spinal stenosis.    - Obtain X-ray of the lumbar spine.    - Prescribed meloxicam  15 mg daily.    - Prescribed cyclobenzaprine  (Flexeril ) 10 mg at bedtime for sleep and muscle relaxation.    - Recommend scheduled acetaminophen  (Tylenol ).    - Advised on use of heating pads and topical analgesics.    - Counseled to seek evaluation at an orthopedic urgent care (e.g., [facility]) or the emergency department for worsening severe pain, or any new onset leg weakness or urinary/bowel

## 2023-12-01 ENCOUNTER — Encounter: Payer: Self-pay | Admitting: Family Medicine

## 2023-12-02 ENCOUNTER — Ambulatory Visit (HOSPITAL_COMMUNITY)
Admission: EM | Admit: 2023-12-02 | Discharge: 2023-12-02 | Disposition: A | Discharge status: Home / Self Care | Attending: Physician Assistant | Admitting: Physician Assistant

## 2023-12-02 ENCOUNTER — Encounter (HOSPITAL_COMMUNITY): Payer: Self-pay

## 2023-12-02 DIAGNOSIS — M545 Low back pain, unspecified: Secondary | ICD-10-CM | POA: Insufficient documentation

## 2023-12-02 DIAGNOSIS — R300 Dysuria: Secondary | ICD-10-CM | POA: Insufficient documentation

## 2023-12-02 LAB — POCT URINALYSIS DIP (MANUAL ENTRY)
Bilirubin, UA: NEGATIVE
Blood, UA: NEGATIVE
Glucose, UA: NEGATIVE mg/dL
Nitrite, UA: NEGATIVE
Protein Ur, POC: NEGATIVE mg/dL
Spec Grav, UA: 1.015 (ref 1.010–1.025)
Urobilinogen, UA: 0.2 U/dL
pH, UA: 5.5 (ref 5.0–8.0)

## 2023-12-02 MED ORDER — CIPROFLOXACIN HCL 500 MG PO TABS: 500.0000 mg | ORAL_TABLET | Freq: Two times a day (BID) | ORAL | 0 refills | Status: DC

## 2023-12-02 NOTE — ED Triage Notes (Addendum)
 Patient here today with c/o right side low back pain and pain in urination X 1 month. She has taken a muscle relaxer with no relief. Patient has tried 3 different antibiotics for UTI. One of them seemed to help but the symptoms back. Most recent was doxycycline with little relief. Patient was also recently seen for low back pain after falling out of bed.

## 2023-12-03 ENCOUNTER — Other Ambulatory Visit: Payer: Self-pay | Admitting: Family Medicine

## 2023-12-03 ENCOUNTER — Ambulatory Visit: Payer: Self-pay | Admitting: Family Medicine

## 2023-12-03 DIAGNOSIS — M5136 Other intervertebral disc degeneration, lumbar region with discogenic back pain only: Secondary | ICD-10-CM

## 2023-12-03 DIAGNOSIS — M419 Scoliosis, unspecified: Secondary | ICD-10-CM

## 2023-12-03 DIAGNOSIS — M47816 Spondylosis without myelopathy or radiculopathy, lumbar region: Secondary | ICD-10-CM

## 2023-12-03 LAB — URINE CULTURE: Culture: NO GROWTH

## 2023-12-04 ENCOUNTER — Ambulatory Visit (HOSPITAL_COMMUNITY): Payer: Self-pay

## 2023-12-04 NOTE — ED Provider Notes (Signed)
 [facility]    CSN: [id number] Arrival date & time: 12/02/23  1542      History   Chief Complaint Chief Complaint  Patient presents with   Dysuria    HPI Deborah [name] is a 78 y.o. female.   Patient here today for evaluation of dysuria and low back pain.  She reports that she did fall recently and had imaging but she has not gotten result from same.  She does state that movement worsens her back pain.  She denies any fever or vomiting.  The history is provided by the patient.  Dysuria Associated symptoms: no abdominal pain, no fever, no nausea and no vomiting     Past Medical History:  Diagnosis Date   Allergy    Anemia    Anxiety    Aortic atherosclerosis (HCC) 11/2016   Noted on CT abd   Arthritis    hands and knees   Bilateral cataracts    History of   Cellulitis    Right finger   Cerebral amyloid angiopathy (CODE)    Cervical spondylosis 12/2008   Noted on Cervical spine   Chronic nausea    Compression fracture of T12 vertebra (HCC) 06/07/2017   Mild   DDD (degenerative disc disease), cervical 12/2008   Noted on Cervical spine   Delusions (HCC) 03/24/2018   Depression    Encephalopathy 01/02/2017   Mild, [place]   GERD (gastroesophageal reflux disease)    Helicobacter pylori infection 2018   History of appendicitis    age 70   History of colon polyps    History of confusion    History of hiatal hernia 10/16/2016   Noted on Endoscopy   Hyperlipidemia    Incontinence in female 08/06/2014   Internal hemorrhoid 08/02/2017   Noted on Colonoscopy   Low blood pressure reading    Migraines    Osteopenia    PONV (postoperative nausea and vomiting)    Pre-diabetes    RLS (restless legs syndrome)    Seizure (HCC)    atypical partial comples vs. confusional migraines   Seizure disorder (HCC) 12/15/2016   Skin cancer    right lower eyelid   Squamous cell skin cancer    history of neck and upper back   Syncope 03/25/2018   TIA  (transient ischemic attack)    questionable per patient   Vitamin D  deficiency     Patient Active Problem List   Diagnosis Date Noted   Urticaria 11/29/2023   Acute cystitis without hematuria 11/29/2023   Chronic midline low back pain without sciatica 11/29/2023   Concern about memory 09/25/2023   Cerebral amyloid angiopathy (HCC) 09/25/2023   Primary osteoarthritis of left knee 03/22/2018   GERD (gastroesophageal reflux disease) 02/25/2018   Osteoporosis 02/25/2018   Compression fracture of T12 vertebra (HCC) 05/30/2017   Aortic atherosclerosis (HCC) by Abd CT scan  11/2016 02/03/2017   History of TIA (transient ischemic attack) 12/15/2016   BMI 22.0-22.9, adult 03/12/2015   History of skin cancer 08/06/2014   Migraine 08/06/2014   Generalized anxiety disorder 08/06/2014   Abnormal glucose 07/01/2013   Medication management 07/01/2013   Hypertension    Hyperlipidemia    Vitamin D  deficiency     Past Surgical History:  Procedure Laterality Date   APPENDECTOMY  1959   CATARACT EXTRACTION, BILATERAL Bilateral 2014   CESAREAN SECTION  1978   COLONOSCOPY W/ POLYPECTOMY  08/02/2017   KNEE ARTHROSCOPY Bilateral 04/2014   [facility]  MOHS SURGERY Right 04/2013   Squamous cell cacinoma, neck/upper back   SPINE SURGERY  2010   C6-C7 fusion   TOTAL KNEE ARTHROPLASTY Left 03/22/2018   Procedure: LEFT TOTAL KNEE ARTHROPLASTY;  Surgeon: Yvone Rush, MD;  Location: [facility];  Service: Orthopedics;  Laterality: Left;  Adductor Block   UPPER GI ENDOSCOPY  10/16/2016    OB History   No obstetric history on file.      Home Medications    Prior to Admission medications   Medication Sig Start Date End Date Taking? Authorizing Provider  ciprofloxacin  (CIPRO ) 500 MG tablet Take 1 tablet (500 mg total) by mouth every 12 (twelve) hours. 12/02/23  Yes Billy Asberry FALCON, PA-C  alendronate  (FOSAMAX ) 70 MG tablet Take 1 tablet (70 mg total) by mouth once a week. Take  with a full glass  of water  on the stomach.  Remain upright for 60 minutes after. 08/21/23   Chandra Toribio POUR, MD  aspirin  EC 81 MG tablet Take 1 tablet (81 mg total) by mouth daily. 11/30/20   Kate Lonni CROME, MD  busPIRone  (BUSPAR ) 10 MG tablet Take  1 tablet  3 x /day  as needed for Nerves                                                                      /                                                        TAKE                                               BY                                     MOUTH Patient not taking: Reported on 11/27/2023 04/19/22   Tonita Fallow, MD  Cholecalciferol  (VITAMIN D3) 125 MCG (5000 UT) CAPS Take 5,000 Units by mouth daily.    [provider]  diltiazem  (CARDIZEM  CD) 120 MG 24 hr capsule Take 1 capsule at Night  for BP & Prevent Migraines 04/27/21   Tonita Fallow, MD  escitalopram  (LEXAPRO ) 20 MG tablet Take 1 tablet (20 mg total) by mouth daily. 09/25/23   Chandra Toribio POUR, MD  ezetimibe  (ZETIA ) 10 MG tablet Take  1 tablet  Daily for Cholesterol                                                                    /  TAKE                                     BY                                            MOUTH                                          ONCE DAILY 09/19/23   Chandra Toribio POUR, MD  hydrOXYzine  (ATARAX ) 25 MG tablet Take 25 mg by mouth 3 (three) times daily.    [provider]  meloxicam  (MOBIC ) 15 MG tablet Take 1 tablet (15 mg total) by mouth daily. 11/27/23   Chandra Toribio POUR, MD  Multiple Vitamins-Minerals (MULTIVITAMIN PO) Take 1 tablet by mouth daily.    [provider]  rosuvastatin  (CRESTOR ) 40 MG tablet Take  1 tablet   Daily  for Cholesterol 09/25/23   Chandra Toribio POUR, MD  tiZANidine  (ZANAFLEX ) 4 MG capsule Take 1 capsule (4 mg total) by mouth at bedtime as needed for muscle spasms. 11/27/23   Chandra Toribio POUR, MD    Family History Family History  Problem Relation Age of Onset   Diabetes Mother     Heart disease Mother    Heart disease Father    Stroke Father    Hypertension Father    Hyperlipidemia Sister    Hypertension Brother     Social History Social History   Tobacco Use   Smoking status: Former    Current packs/day: 0.00    Average packs/day: 1 pack/day for 4.0 years (4.0 ttl pk-yrs)    Types: Cigarettes    Start date: 03/25/1982    Quit date: 03/25/1986    Years since quitting: 37.7   Smokeless tobacco: Never  Vaping Use   Vaping status: Never Used  Substance Use Topics   Alcohol use: Not Currently   Drug use: No     Allergies   Rizatriptan , Banana, Banana extract allergy skin test, Chocolate, Guaifenesin-codeine, Maxalt  [rizatriptan  benzoate], Other, Venlafaxine, Tussin [guaifenesin], and Zostavax [zoster vaccine live]   Review of Systems Review of Systems  Constitutional:  Negative for chills and fever.  Eyes:  Negative for discharge and redness.  Respiratory:  Negative for shortness of breath.   Gastrointestinal:  Negative for abdominal pain, nausea and vomiting.  Genitourinary:  Positive for dysuria.  Musculoskeletal:  Positive for back pain and myalgias.     Physical Exam Triage Vital Signs ED Triage Vitals  Encounter Vitals Group     BP 12/02/23 1557 125/77     Girls Systolic BP Percentile --      Girls Diastolic BP Percentile --      Boys Systolic BP Percentile --      Boys Diastolic BP Percentile --      Pulse Rate 12/02/23 1557 96     Resp 12/02/23 1557 16     Temp 12/02/23 1557 98.9 F (37.2 C)     Temp Source 12/02/23 1557 Oral     SpO2 12/02/23 1557 94 %     Weight --      Height --      Head Circumference --  Peak Flow --      Pain Score 12/02/23 1558 9     Pain Loc --      Pain Education --      Exclude from Growth Chart --    No data found.  Updated Vital Signs BP 125/77 (BP Location: Left Arm)   Pulse 96   Temp 98.9 F (37.2 C) (Oral)   Resp 16   SpO2 94%   Visual Acuity Right Eye Distance:   Left Eye  Distance:   Bilateral Distance:    Right Eye Near:   Left Eye Near:    Bilateral Near:     Physical Exam Vitals and nursing note reviewed.  Constitutional:      General: She is not in acute distress.    Appearance: Normal appearance. She is not ill-appearing.  HENT:     Head: Normocephalic and atraumatic.  Eyes:     Conjunctiva/sclera: Conjunctivae normal.  Cardiovascular:     Rate and Rhythm: Normal rate.  Pulmonary:     Effort: Pulmonary effort is normal. No respiratory distress.  Musculoskeletal:     Comments: Mild tenderness appreciated to right low back  Neurological:     Mental Status: She is alert.  Psychiatric:        Mood and Affect: Mood normal.        Behavior: Behavior normal.        Thought Content: Thought content normal.      UC Treatments / Results  Labs (all labs ordered are listed, but only abnormal results are displayed) Labs Reviewed  POCT URINALYSIS DIP (MANUAL ENTRY) - Abnormal; Notable for the following components:      Result Value   Ketones, POC UA trace (5) (*)    Leukocytes, UA Trace (*)    All other components within normal limits  URINE CULTURE    EKG   Radiology No results found.  Procedures Procedures (including critical care time)  Medications Ordered in UC Medications - No data to display  Initial Impression / Assessment and Plan / UC Course  I have reviewed the triage vital signs and the nursing notes.  Pertinent labs & imaging results that were available during my care of the patient were reviewed by me and considered in my medical decision making (see chart for details).    Trace leukocytes on UA.  Will treat with antibiotic to cover possible recurrent UTI.  Urine culture ordered.  Discussed need for results with radiology department who will work on getting previous x-ray read.  Suspect likely muscular strain of back given worsening pain with movement.  Final Clinical Impressions(s) / UC Diagnoses   Final  diagnoses:  Dysuria  Acute midline low back pain without sciatica   Discharge Instructions   None    ED Prescriptions     Medication Sig Dispense Auth. Provider   ciprofloxacin  (CIPRO ) 500 MG tablet Take 1 tablet (500 mg total) by mouth every 12 (twelve) hours. 10 tablet Billy Asberry FALCON, PA-C      PDMP not reviewed this encounter.   Billy Asberry FALCON, PA-C 12/04/23 1930

## 2023-12-11 ENCOUNTER — Other Ambulatory Visit: Payer: Self-pay

## 2023-12-11 ENCOUNTER — Emergency Department

## 2023-12-11 ENCOUNTER — Emergency Department
Admission: EM | Admit: 2023-12-11 | Discharge: 2023-12-11 | Disposition: A | Discharge status: Home / Self Care | Attending: Emergency Medicine | Admitting: Emergency Medicine

## 2023-12-11 DIAGNOSIS — R29818 Other symptoms and signs involving the nervous system: Secondary | ICD-10-CM | POA: Diagnosis not present

## 2023-12-11 DIAGNOSIS — Z8673 Personal history of transient ischemic attack (TIA), and cerebral infarction without residual deficits: Secondary | ICD-10-CM | POA: Insufficient documentation

## 2023-12-11 DIAGNOSIS — R000 Tachycardia, unspecified: Secondary | ICD-10-CM | POA: Diagnosis not present

## 2023-12-11 DIAGNOSIS — R5383 Other fatigue: Secondary | ICD-10-CM | POA: Diagnosis not present

## 2023-12-11 DIAGNOSIS — I6782 Cerebral ischemia: Secondary | ICD-10-CM | POA: Diagnosis not present

## 2023-12-11 DIAGNOSIS — G319 Degenerative disease of nervous system, unspecified: Secondary | ICD-10-CM | POA: Diagnosis not present

## 2023-12-11 DIAGNOSIS — R479 Unspecified speech disturbances: Secondary | ICD-10-CM | POA: Insufficient documentation

## 2023-12-11 DIAGNOSIS — R42 Dizziness and giddiness: Secondary | ICD-10-CM | POA: Diagnosis not present

## 2023-12-11 DIAGNOSIS — Z85828 Personal history of other malignant neoplasm of skin: Secondary | ICD-10-CM | POA: Diagnosis not present

## 2023-12-11 DIAGNOSIS — R220 Localized swelling, mass and lump, head: Secondary | ICD-10-CM | POA: Insufficient documentation

## 2023-12-11 DIAGNOSIS — R4182 Altered mental status, unspecified: Secondary | ICD-10-CM | POA: Diagnosis not present

## 2023-12-11 DIAGNOSIS — I672 Cerebral atherosclerosis: Secondary | ICD-10-CM | POA: Diagnosis not present

## 2023-12-11 LAB — CBC
HCT: 40 % (ref 36.0–46.0)
Hemoglobin: 13.3 g/dL (ref 12.0–15.0)
MCH: 29.7 pg (ref 26.0–34.0)
MCHC: 33.3 g/dL (ref 30.0–36.0)
MCV: 89.3 fL (ref 80.0–100.0)
Platelets: 299 K/uL (ref 150–400)
RBC: 4.48 MIL/uL (ref 3.87–5.11)
RDW: 14.3 % (ref 11.5–15.5)
WBC: 9.2 K/uL (ref 4.0–10.5)
nRBC: 0 % (ref 0.0–0.2)

## 2023-12-11 LAB — URINALYSIS, ROUTINE W REFLEX MICROSCOPIC
Bacteria, UA: NONE SEEN
Bilirubin Urine: NEGATIVE
Glucose, UA: NEGATIVE mg/dL
Hgb urine dipstick: NEGATIVE
Ketones, ur: 5 mg/dL — AB
Leukocytes,Ua: NEGATIVE
Nitrite: NEGATIVE
Protein, ur: 30 mg/dL — AB
Specific Gravity, Urine: 1.011 (ref 1.005–1.030)
pH: 6 (ref 5.0–8.0)

## 2023-12-11 LAB — COMPREHENSIVE METABOLIC PANEL WITH GFR
ALT: 21 U/L (ref 0–44)
AST: 32 U/L (ref 15–41)
Albumin: 4.1 g/dL (ref 3.5–5.0)
Alkaline Phosphatase: 96 U/L (ref 38–126)
Anion gap: 11 (ref 5–15)
BUN: 16 mg/dL (ref 8–23)
CO2: 24 mmol/L (ref 22–32)
Calcium: 9.5 mg/dL (ref 8.9–10.3)
Chloride: 97 mmol/L — ABNORMAL LOW (ref 98–111)
Creatinine, Ser: 0.78 mg/dL (ref 0.44–1.00)
GFR, Estimated: >60 mL/min (ref >60)
Glucose, Bld: 109 mg/dL — ABNORMAL HIGH (ref 70–99)
Potassium: 3.8 mmol/L (ref 3.5–5.1)
Sodium: 132 mmol/L — ABNORMAL LOW (ref 135–145)
Total Bilirubin: 1.1 mg/dL (ref 0.0–1.2)
Total Protein: 7.9 g/dL (ref 6.5–8.1)

## 2023-12-11 MED ORDER — LORAZEPAM 1 MG PO TABS
1.0000 mg | ORAL_TABLET | Freq: Once | ORAL | Status: AC
  Administered 2023-12-11: 1 mg via ORAL
  Filled 2023-12-11: qty 1

## 2023-12-11 MED ORDER — SODIUM CHLORIDE 0.9 % IV BOLUS
1000.0000 mL | Freq: Once | INTRAVENOUS | Status: AC
  Administered 2023-12-11: 1000 mL via INTRAVENOUS

## 2023-12-11 NOTE — ED Notes (Signed)
 Pt and husband given DC instructions by provider with this RN at bedside. Husband given opportunity for questions. Husband verbalized understanding of follow up care. Pt taken from ED in wheelchair by this RN. NAD noted at time of departure.

## 2023-12-11 NOTE — ED Triage Notes (Signed)
 Pt comes in via pov with swelling to her upper lip. Pts husband states that her lip doesn't look the way it normally does. [name] notices a difference in pt's mental status since Monday morning. PT is alert and oriented x4, unable to tell this RN the date, but has been able to answer all other questions normally. Pt had a urinary tract infection about three weeks ago. Pt fell and struck her head, and wasn't seen for it. No noted deficits at this time.

## 2023-12-11 NOTE — Discharge Instructions (Signed)
 Your lab tests, CT scan of the head, and MRI of the brain were all okay today.  Please follow-up with your doctor for continued monitoring of your symptoms.  Continue taking all your medications as usual.

## 2023-12-11 NOTE — ED Notes (Signed)
 Pt disconnected from monitor to use bedside commode. Pt able to stand to get to the toilet and back in bed. Call light in reach.

## 2023-12-11 NOTE — ED Provider Notes (Signed)
 [facility] Provider Note    Event Date/Time   First MD Initiated Contact with Patient 12/11/23 1713     (approximate)   History   Chief Complaint: Altered Mental Status   HPI  Deborah [name] is a 78 y.o. female with history of GERD, anxiety who comes the ED due to swelling of her upper lip which she noticed a few days ago.  She notes that she did get dizzy and fell and hit her head a few weeks ago.  She has felt okay since then, but sometimes gets lightheaded when she stands up.  Denies chest pain or shortness of breath or palpitations.  No motor weakness.  Husband notes intermittent episodes of altered speech, most recently yesterday when patient seemed to be unable  to converse with him on a phone call.    On interview patient seems to have difficulty expressing herself and frequently defers to her husband to speak for her.        Past Medical History:  Diagnosis Date   Allergy    Anemia    Anxiety    Aortic atherosclerosis (HCC) 11/2016   Noted on CT abd   Arthritis    hands and knees   Bilateral cataracts    History of   Cellulitis    Right finger   Cerebral amyloid angiopathy (CODE)    Cervical spondylosis 12/2008   Noted on Cervical spine   Chronic nausea    Compression fracture of T12 vertebra (HCC) 06/07/2017   Mild   DDD (degenerative disc disease), cervical 12/2008   Noted on Cervical spine   Delusions (HCC) 03/24/2018   Depression    Encephalopathy 01/02/2017   Mild, [place] EEG   GERD (gastroesophageal reflux disease)    Helicobacter pylori infection 2018   History of appendicitis    age 27   History of colon polyps    History of confusion    History of hiatal hernia 10/16/2016   Noted on Endoscopy   Hyperlipidemia    Incontinence in female 08/06/2014   Internal hemorrhoid 08/02/2017   Noted on Colonoscopy   Low blood pressure reading    Migraines    Osteopenia    PONV (postoperative nausea and vomiting)     Pre-diabetes    RLS (restless legs syndrome)    Seizure (HCC)    atypical partial comples vs. confusional migraines   Seizure disorder (HCC) 12/15/2016   Skin cancer    right lower eyelid   Squamous cell skin cancer    history of neck and upper back   Syncope 03/25/2018   TIA (transient ischemic attack)    questionable per patient   Vitamin D  deficiency     Current Outpatient Rx   Order #: [id number] Class: Normal   Order #: [id number] Class: OTC   Order #: [id number] Class: Normal   Order #: [id number] Class: Historical Med   Order #: [id number] Class: Normal   Order #: [id number] Class: Normal   Order #: [id number] Class: Normal   Order #: [id number] Class: Normal   Order #: [id number] Class: Historical Med   Order #: [id number] Class: Normal   Order #: [id number] Class: Historical Med   Order #: [id number] Class: Normal   Order #: [id number] Class: Normal    Past Surgical History:  Procedure Laterality Date   APPENDECTOMY  1959   CATARACT EXTRACTION, BILATERAL Bilateral 2014   CESAREAN SECTION  1978   COLONOSCOPY W/ POLYPECTOMY  08/02/2017   KNEE ARTHROSCOPY Bilateral 04/2014   [facility]  MOHS SURGERY Right 04/2013   Squamous cell cacinoma, neck/upper back   SPINE SURGERY  2010   C6-C7 fusion   TOTAL KNEE ARTHROPLASTY Left 03/22/2018   Procedure: LEFT TOTAL KNEE ARTHROPLASTY;  Surgeon: Yvone Rush, MD;  Location: [facility];  Service: Orthopedics;  Laterality: Left;  Adductor Block   UPPER GI ENDOSCOPY  10/16/2016    Physical Exam   Triage Vital Signs: ED Triage Vitals  Encounter Vitals Group     BP 12/11/23 1608 (!) 146/99     Girls Systolic BP Percentile --      Girls Diastolic BP Percentile --      Boys Systolic BP Percentile --      Boys Diastolic BP Percentile --      Pulse Rate 12/11/23 1608 (!) 118     Resp 12/11/23 1608 18     Temp 12/11/23 1608 98.4 F (36.9 C)     Temp Source 12/11/23 1608 Oral     SpO2 12/11/23 1608 97 %     Weight 12/11/23 1609 133 lb 2.5 oz  (60.4 kg)     Height 12/11/23 1609 5' 5 (1.651 m)     Head Circumference --      Peak Flow --      Pain Score 12/11/23 1608 8     Pain Loc --      Pain Education --      Exclude from Growth Chart --     Most recent vital signs: Vitals:   12/11/23 2100 12/11/23 2200  BP: (!) 140/101 (!) 143/93  Pulse: (!) 209 96  Resp: (!) 34 (!) 28  Temp:  98.7 F (37.1 C)  SpO2: 90% 97%    General: Awake, no distress.  CV:  Good peripheral perfusion.  Tachycardia heart rate 110.  Normal distal pulses.  No murmur Resp:  Normal effort.  Clear to auscultation Abd:  No distention.  Soft nontender Other:  No lower extremity edema   ED Results / Procedures / Treatments   Labs (all labs ordered are listed, but only abnormal results are displayed) Labs Reviewed  COMPREHENSIVE METABOLIC PANEL WITH GFR - Abnormal; Notable for the following components:      Result Value   Sodium 132 (*)    Chloride 97 (*)    Glucose, Bld 109 (*)    All other components within normal limits  URINALYSIS, ROUTINE W REFLEX MICROSCOPIC - Abnormal; Notable for the following components:   Color, Urine YELLOW (*)    APPearance CLEAR (*)    Ketones, ur 5 (*)    Protein, ur 30 (*)    All other components within normal limits  CBC     EKG Interpreted by me Sinus tachycardia rate 121.  Normal axis, normal intervals.  Poor R wave progression.  ST segments and T waves.   RADIOLOGY CT head interpreted by me, unremarkable no intracranial hemorrhage or mass.  Radiology report reviewed   PROCEDURES:  Procedures   MEDICATIONS ORDERED IN ED: Medications  sodium chloride  0.9 % bolus 1,000 mL (0 mLs Intravenous Stopped 12/11/23 2206)  LORazepam  (ATIVAN ) tablet 1 mg (1 mg Oral Given 12/11/23 1837)     IMPRESSION / MDM / ASSESSMENT AND PLAN / ED COURSE  I reviewed the triage vital signs and the nursing notes.  DDx: Intracranial hemorrhage, intracranial mass, stroke, electrolyte derangement, anemia, dehydration,  anxiety  Patient's presentation is most consistent with acute presentation with potential threat to life or bodily function.  Patient presents  with speech difficulty along with episodes of dizziness with standing.  She is nontoxic, maintaining her airway.  Initial serum labs are okay.  CT head unremarkable.  Will obtain MRI to further evaluate for CVA   ----------------------------------------- 10:51 PM on 12/11/2023 ----------------------------------------- MRI negative.  Urinalysis unremarkable.  Vital signs remain reassuring, patient is nontoxic.  Suitable for outpatient follow-up.  MRI suggests some chronic cerebral microvascular and amyloid disease      FINAL CLINICAL IMPRESSION(S) / ED DIAGNOSES   Final diagnoses:  Other fatigue     Rx / DC Orders   ED Discharge Orders     None        Note:  This document was prepared using Dragon voice recognition software and may include unintentional dictation errors.   Viviann Pastor, MD 12/11/23 2251

## 2023-12-12 ENCOUNTER — Telehealth: Payer: Self-pay

## 2023-12-12 NOTE — Telephone Encounter (Signed)
 Unless something opens up tomorrow or Friday that's the earliest I can see her.  Based on what I read from the ED note it should be okay to keep the appointment on 9/9

## 2023-12-12 NOTE — Telephone Encounter (Signed)
 Copied from CRM [id number]. Topic: Appointments - Scheduling Inquiry for Clinic >> Dec 12, 2023 10:17 AM Tinnie BROCKS wrote: Reason for CRM: Pts husband calling to schedule hospital fu. I scheduled for soonest available 13 days from discharge. Husband thinks she should be seen sooner for possible mini stroke. Please call Garnette at [phone].

## 2023-12-12 NOTE — Telephone Encounter (Signed)
 I returned the call as requested about the ED FU that was scheduled by [facility]. I advised the pt husband that provider is on Vacation next week that's why first available was for the following week but that let the provider know to look at the ED encounter to see if he recommends anything different.

## 2023-12-13 NOTE — Telephone Encounter (Signed)
 LVM informing pt that the provider said based on what he sees the appointment on 9/9 should be fine.

## 2023-12-14 DIAGNOSIS — M5459 Other low back pain: Secondary | ICD-10-CM | POA: Diagnosis not present

## 2023-12-25 ENCOUNTER — Ambulatory Visit: Admitting: Family Medicine

## 2023-12-25 ENCOUNTER — Encounter: Payer: Self-pay | Admitting: Family Medicine

## 2023-12-25 VITALS — BP 121/79 | HR 104 | Ht 65.0 in

## 2023-12-25 DIAGNOSIS — L509 Urticaria, unspecified: Secondary | ICD-10-CM | POA: Diagnosis not present

## 2023-12-25 DIAGNOSIS — I680 Cerebral amyloid angiopathy: Secondary | ICD-10-CM | POA: Diagnosis not present

## 2023-12-25 DIAGNOSIS — E854 Organ-limited amyloidosis: Secondary | ICD-10-CM | POA: Diagnosis not present

## 2023-12-25 NOTE — Progress Notes (Signed)
 Established Patient Office Visit  Subjective   Patient ID: Deborah [name], female    DOB: [birth year]  Age: 78 y.o. MRN: [id number]  Chief Complaint  Patient presents with   Hospitalization Follow-up    HPI  Subjective - Follow-up from recent emergency department visit for lip swelling and aphasia. Reports upper lip swelling, which has since resolved. Denies pain, trauma, bleeding, or bruising. Also experienced word-finding difficulty (aphasia), which has also resolved. Denies dysarthria, difficulty breathing, or difficulty swallowing. No swelling of the tongue noted.  - Reports chronic, intermittent hives for the last couple of months. States they are everywhere but started on the inner thighs. They are pruritic. This also occurred around the same time last year and resolved spontaneously. Has been using topical cortisone cream. Has tried various oral antihistamines including hydroxyzine , Claritin , Benadryl , and recently started Allegra with minimal to some relief. Denies known triggers, including food.  Medications Medications reviewed include Zanaflex  as needed, Crestor , a multivitamin, meloxicam  as needed, hydroxyzine , Zetia , Lexapro , and diltiazem . A course of Cipro  was prescribed a month ago for hives, with no effect. Has tried OTC Benadryl , Claritin , and is currently taking Allegra for hives with variable results.  PMH, PSH, FH, Social Hx PMHx: Cerebral amyloid angiopathy (CAA), hives. Allergies: Poison ivy. Denies known allergies to bees or bug bites. Social Hx: Reports staying inside most of the time.  ROS Constitutional: Denies fever. HEENT: Reports upper lip swelling (resolved). Denies tongue swelling, sore throat, difficulty swallowing. Neurological: Reports aphasia (resolved). Denies dysarthria. Skin: Reports intermittent, pruritic rash consistent with hives. Denies known triggers.     The 10-year ASCVD risk score (Arnett DK, et al., 2019) is: 23.7%  Health  Maintenance Due  Topic Date Due   Pneumococcal Vaccine: 50+ Years (2 of 2 - PCV20 or PCV21) 08/06/2015   Colonoscopy  10/29/2022   Medicare Annual Wellness (AWV)  08/01/2023   Influenza Vaccine  11/16/2023   COVID-19 Vaccine (5 - 2025-26 season) 12/17/2023      Objective:     BP 121/79   Pulse (!) 104   Ht 5' 5 (1.651 m)   SpO2 97%   BMI 22.16 kg/m    Physical Exam Gen: alert, oriented Heent: no lip or tongue swelling Cv: rrr Pulm: lctab Skin: wheals noted on extremities.    No results found for any visits on 12/25/23.      Assessment & Plan:   Urticaria Assessment & Plan: Presents with a several-month history of intermittent but near-constant pruritic hives, predominantly on the inner thighs but occurring elsewhere. This is a recurrence from last year. Has trialed multiple antihistamines (hydroxyzine , Benadryl , Claritin , Allegra) with minimal and inconsistent relief. Using topical cortisone cream. No clear triggers identified. - Plan to order lab testing to investigate for environmental allergies and alpha-gal allergy . A lab visit will need to be scheduled. - Continue using topical anti-itch lotions like Sarna (pramoxine), calamine lotion, or topical Benadryl  lotion for symptomatic relief. Advised to use topical steroid cream sparingly. - May increase non-sedating antihistamine (Claritin , Zyrtec, or Allegra) to twice the standard daily dose (e.g., 20mg  of Claritin /Zyrtec) for a short period to manage pruritus. - If labs are unrevealing and symptoms persist, will consider a skin biopsy of a lesion. - Follow-up appointment scheduled for October.  Orders: -     CBC with Differential/Platelet; Future -     Allergens w/Comp Rflx Area 2; Future -     Allergy  Profile, Food; Future -     Alpha-Gal Panel;  Future -     TSH; Future -     ANA w/Reflex if Positive; Future  Cerebral amyloid angiopathy (HCC) Assessment & Plan: Recent emergency department visit for acute  onset of upper lip swelling and aphasia. Both symptoms have since resolved. CT scan from the ER visit was unremarkable for acute causes but showed evidence of known cerebral amyloid angiopathy, which may explain the transient neurological symptoms. The lip swelling is not thought to be related to the aphasia. - Advised to seek immediate emergency care if lip swelling recurs with any signs of airway compromise, such as difficulty breathing, swallowing, or tongue/throat swelling. - If isolated lip swelling recurs, advised to take a photograph and contact the office for an urgent evaluation.      No follow-ups on file.    Toribio MARLA Slain, MD

## 2023-12-25 NOTE — Patient Instructions (Signed)
 It was nice to see you today,  We addressed the following topics today: -I would like to order some test to see if I can determine the cause of your hives. - Until we can determine the cause, we can contribute due to treat your symptoms with second-generation antihistamines like Claritin  Zyrtec or Allegra and topical anti-itch treatments.  You can take 2 tablets of the Claritin  or Allegra per day for a short period of time. - Any of the over-the-counter anti-itch creams can work.  This includes Sarna, Benadryl  cream, calamine lotion.  You can also use your steroid cream, but try to use this no more than 1-2 times per day. - If you have any difficulty swallowing, breathing, throat swelling or tongue swelling you should have someone take you to the emergency department   Have a great day,  Rolan Slain, MD

## 2023-12-31 NOTE — Assessment & Plan Note (Signed)
 Recent emergency department visit for acute onset of upper lip swelling and aphasia. Both symptoms have since resolved. CT scan from the ER visit was unremarkable for acute causes but showed evidence of known cerebral amyloid angiopathy, which may explain the transient neurological symptoms. The lip swelling is not thought to be related to the aphasia. - Advised to seek immediate emergency care if lip swelling recurs with any signs of airway compromise, such as difficulty breathing, swallowing, or tongue/throat swelling. - If isolated lip swelling recurs, advised to take a photograph and contact the office for an urgent evaluation.

## 2023-12-31 NOTE — Assessment & Plan Note (Signed)
 Presents with a several-month history of intermittent but near-constant pruritic hives, predominantly on the inner thighs but occurring elsewhere. This is a recurrence from last year. Has trialed multiple antihistamines (hydroxyzine , Benadryl , Claritin , Allegra) with minimal and inconsistent relief. Using topical cortisone cream. No clear triggers identified. - Plan to order lab testing to investigate for environmental allergies and alpha-gal allergy . A lab visit will need to be scheduled. - Continue using topical anti-itch lotions like Sarna (pramoxine), calamine lotion, or topical Benadryl  lotion for symptomatic relief. Advised to use topical steroid cream sparingly. - May increase non-sedating antihistamine (Claritin , Zyrtec, or Allegra) to twice the standard daily dose (e.g., 20mg  of Claritin /Zyrtec) for a short period to manage pruritus. - If labs are unrevealing and symptoms persist, will consider a skin biopsy of a lesion. - Follow-up appointment scheduled for October.

## 2024-01-01 ENCOUNTER — Telehealth: Payer: Self-pay

## 2024-01-01 NOTE — Telephone Encounter (Signed)
 Copied from CRM [id number]. Topic: Appointments - Scheduling Inquiry for Clinic >> Dec 31, 2023  5:11 PM Harlene ORN wrote: Reason for CRM: Patient has an appointment for her MRI on 09/19 @ 8:15 pm and needs the address and phone number on where she was scheduled with. Please call back and discuss with patient   Sent a mychart message to the patient also called the patient LVM to contact  the office

## 2024-01-03 ENCOUNTER — Other Ambulatory Visit

## 2024-01-03 DIAGNOSIS — L509 Urticaria, unspecified: Secondary | ICD-10-CM | POA: Diagnosis not present

## 2024-01-04 DIAGNOSIS — M545 Low back pain, unspecified: Secondary | ICD-10-CM | POA: Diagnosis not present

## 2024-01-04 LAB — ALLERGY PROFILE, FOOD

## 2024-01-06 LAB — ALLERGY PROFILE, FOOD
Allergen Salmon IgE: <0.1 kU/L
Codfish IgE: <0.1 kU/L
F001-IgE Egg White: <0.1 kU/L
F002-IgE Milk: <0.1 kU/L
F004-IgE Wheat: <0.1 kU/L
F010-IgE Sesame Seed: <0.1 kU/L
F017-IgE Hazelnut (Filbert): <0.1 kU/L
F018-IgE Brazil Nut: <0.1 kU/L
F020-IgE Almond: <0.1 kU/L
F202-IgE Cashew Nut: <0.1 kU/L
F256-IgE Walnut: <0.1 kU/L
F416-IgE Tri a 19(w-5 gliadin): <0.1 kU/L
Peanut, IgE: <0.1 kU/L
Scallop IgE: <0.1 kU/L
Shrimp IgE: <0.1 kU/L
Soybean IgE: <0.1 kU/L
Tuna: <0.1 kU/L

## 2024-01-06 LAB — CBC WITH DIFFERENTIAL/PLATELET
Basophils Absolute: 0 x10E3/uL (ref 0.0–0.2)
Basos: 0 %
EOS (ABSOLUTE): 0.2 x10E3/uL (ref 0.0–0.4)
Eos: 2 %
Hematocrit: 38.4 % (ref 34.0–46.6)
Hemoglobin: 12.4 g/dL (ref 11.1–15.9)
Immature Grans (Abs): 0 x10E3/uL (ref 0.0–0.1)
Immature Granulocytes: 0 %
Lymphocytes Absolute: 2.3 x10E3/uL (ref 0.7–3.1)
Lymphs: 31 %
MCH: 29.6 pg (ref 26.6–33.0)
MCHC: 32.3 g/dL (ref 31.5–35.7)
MCV: 92 fL (ref 79–97)
Monocytes Absolute: 0.6 x10E3/uL (ref 0.1–0.9)
Monocytes: 8 %
Neutrophils Absolute: 4.3 x10E3/uL (ref 1.4–7.0)
Neutrophils: 59 %
Platelets: 406 x10E3/uL (ref 150–450)
RBC: 4.19 x10E6/uL (ref 3.77–5.28)
RDW: 13 % (ref 11.7–15.4)
WBC: 7.3 x10E3/uL (ref 3.4–10.8)

## 2024-01-06 LAB — ALPHA-GAL PANEL
Allergen Lamb IgE: <0.1 kU/L
Beef IgE: <0.1 kU/L
IgE (Immunoglobulin E), Serum: 44 [IU]/mL (ref 6–495)
O215-IgE Alpha-Gal: <0.1 kU/L
Pork IgE: <0.1 kU/L

## 2024-01-06 LAB — ALLERGENS W/COMP RFLX AREA 2
Alternaria Alternata IgE: <0.1 kU/L
Aspergillus Fumigatus IgE: <0.1 kU/L
Bermuda Grass IgE: <0.1 kU/L
Cedar, Mountain IgE: <0.1 kU/L
Cladosporium Herbarum IgE: <0.1 kU/L
Cockroach, German IgE: <0.1 kU/L
Common Silver Birch IgE: <0.1 kU/L
Cottonwood IgE: <0.1 kU/L
D Farinae IgE: <0.1 kU/L
D Pteronyssinus IgE: <0.1 kU/L
E001-IgE Cat Dander: <0.1 kU/L
E005-IgE Dog Dander: <0.1 kU/L
Elm, American IgE: <0.1 kU/L
Johnson Grass IgE: <0.1 kU/L
Maple/Box Elder IgE: <0.1 kU/L
Mouse Urine IgE: <0.1 kU/L
Oak, White IgE: <0.1 kU/L
Pecan, Hickory IgE: <0.1 kU/L
Penicillium Chrysogen IgE: <0.1 kU/L
Pigweed, Rough IgE: <0.1 kU/L
Ragweed, Short IgE: <0.1 kU/L
Sheep Sorrel IgE Qn: <0.1 kU/L
Timothy Grass IgE: <0.1 kU/L
White Mulberry IgE: <0.1 kU/L

## 2024-01-06 LAB — TSH: TSH: 3.01 u[IU]/mL (ref 0.450–4.500)

## 2024-01-06 LAB — ANA W/REFLEX IF POSITIVE: Anti Nuclear Antibody (ANA): NEGATIVE

## 2024-01-07 ENCOUNTER — Ambulatory Visit: Payer: Self-pay | Admitting: Family Medicine

## 2024-01-09 DIAGNOSIS — M545 Low back pain, unspecified: Secondary | ICD-10-CM | POA: Diagnosis not present

## 2024-01-14 DIAGNOSIS — L508 Other urticaria: Secondary | ICD-10-CM | POA: Diagnosis not present

## 2024-01-29 ENCOUNTER — Encounter: Payer: Self-pay | Admitting: Family Medicine

## 2024-01-29 ENCOUNTER — Ambulatory Visit: Admitting: Family Medicine

## 2024-01-29 VITALS — BP 111/76 | HR 96 | Ht 65.0 in | Wt 138.0 lb

## 2024-01-29 DIAGNOSIS — I680 Cerebral amyloid angiopathy: Secondary | ICD-10-CM

## 2024-01-29 DIAGNOSIS — E854 Organ-limited amyloidosis: Secondary | ICD-10-CM | POA: Diagnosis not present

## 2024-01-29 DIAGNOSIS — I10 Essential (primary) hypertension: Secondary | ICD-10-CM | POA: Diagnosis not present

## 2024-01-29 DIAGNOSIS — E559 Vitamin D deficiency, unspecified: Secondary | ICD-10-CM | POA: Diagnosis not present

## 2024-01-29 DIAGNOSIS — E785 Hyperlipidemia, unspecified: Secondary | ICD-10-CM | POA: Diagnosis not present

## 2024-01-29 DIAGNOSIS — Z8673 Personal history of transient ischemic attack (TIA), and cerebral infarction without residual deficits: Secondary | ICD-10-CM

## 2024-01-29 DIAGNOSIS — M810 Age-related osteoporosis without current pathological fracture: Secondary | ICD-10-CM | POA: Diagnosis not present

## 2024-01-29 DIAGNOSIS — L509 Urticaria, unspecified: Secondary | ICD-10-CM

## 2024-01-29 NOTE — Patient Instructions (Addendum)
 It was nice to see you today,  We addressed the following topics today: -I would like you to call [facility] to see if you will be able to schedule an appointment with them without a new referral. - Their phone number [phone] - I would like you to try and wean off the hydroxyzine  at night.  If you are taking more than 1 tablet, start taking only 1 tablet.  Then decrease it to 1 tablet every other night, and then every 3 nights. - Reduce this medication every 2 weeks until you stop taking it completely. - I would like you to try taking the cimetidine only once per day if you can tolerate it.  If you need to take more that is okay.  Have a great day,  Rolan Slain, MD

## 2024-01-29 NOTE — Assessment & Plan Note (Signed)
-   Recurrent episodes of word-finding difficulty, lasting 15-20 minutes, which are follow-up of a chronic issue. Last seen by neurology [facility], Dr. Orrie) in 2023. Associated with intermittent dizziness and double vision. No other focal neurologic deficits. Exam today is non-focal. - Advised to call [facility] to schedule a follow-up appointment. A new referral is not needed as the last visit was within three years, but will provide one if requested by their office.

## 2024-01-29 NOTE — Assessment & Plan Note (Signed)
-   Hives are improving. Recent allergy  testing was negative for food and environmental allergens. Taking Allegra and hydroxyzine . - Continue Allegra as needed. - Taper and discontinue hydroxyzine  due to risk of anticholinergic side effects. Counseled to reduce dose over several weeks and monitor for worsening hives.

## 2024-01-29 NOTE — Progress Notes (Signed)
 Established Patient Office Visit  Subjective   Patient ID: Deborah [name], female    DOB: 1946-02-26  Age: 78 y.o. MRN: [id number]  Chief Complaint  Patient presents with   Medical Management of Chronic Issues    HPI  Subjective - Recurrent episodes of loss of fluency/word-finding difficulty since last visit in September. Describes talking gibberish and not being able to get the correct words out. Two episodes occurred, the first being more severe. Episodes last approximately 15-20 minutes. Comprehension is intact during these episodes. No associated weakness or numbness in arms or legs. - Intermittent dizziness and double vision, not frequent. - Hives are almost resolved and much better. - No further episodes of lip swelling. Reports some tongue pain, feels like it has been bitten multiple times. Does not feel it is swelling to a problematic degree. Pain is more on the inside of the lip. No tingling.  Medications Medications include Allegra, melatonin, a brain health supplement (Equate brand from [place]), aspirin  81 mg, alendronate , ezetimibe  (Zetia ), sumatriptan , meloxicam , and cimetidine 400 mg. Hydroxyzine  was also prescribed by dermatology for hives. Advised to take cimetidine once daily for reflux if effective, due to potential for medication absorption interference and worsening osteoporosis. Advised to taper off hydroxyzine  due to anticholinergic side effects in older adults; recommended to reduce to one capsule nightly for a couple of weeks, then every other night, then stop. Will restart if hives worsen.  PMH, PSH, FH, Social Hx PMHx: Word-finding difficulty spells (seen by neurology in 2019 and 2023), osteoporosis, acid reflux, hives. PSH: Not discussed. FH: Not discussed. Social Hx: Denies significant caffeine use, buys low-caffeine coffee.  ROS Neurologic: Positive for intermittent dysphasia, dizziness, and diplopia. Negative for limb weakness or numbness, or receptive  aphasia. Integumentary: Positive for resolving urticaria. Negative for new lip swelling. Oral: Positive for tongue pain/biting. Negative for visible ulcers or swelling on exam.   The 10-year ASCVD risk score (Arnett DK, et al., 2019) is: 20.4%  Health Maintenance Due  Topic Date Due   Pneumococcal Vaccine: 50+ Years (2 of 2 - PCV20 or PCV21) 08/06/2015   Colonoscopy  10/29/2022   Medicare Annual Wellness (AWV)  08/01/2023   COVID-19 Vaccine (5 - 2025-26 season) 12/17/2023      Objective:     BP 111/76   Pulse 96   Ht 5' 5 (1.651 m)   Wt 138 lb (62.6 kg)   SpO2 96%   BMI 22.96 kg/m    Physical Exam Gen: alert, oriented HEENT: Oropharynx clear, no visible swelling or ulcerations on tongue or lips. Tongue appears normal. Pulm: no respiratory distress Neuro: some mild word finding difficulty noted.  No expressive aphasia or receptive aphasia.  Psych: pleasant affect   No results found for any visits on 01/29/24.      Assessment & Plan:   Cerebral amyloid angiopathy (HCC) Assessment & Plan: - Recurrent episodes of word-finding difficulty, lasting 15-20 minutes, which are follow-up of a chronic issue. Last seen by neurology [facility], Dr. Orrie) in 2023. Associated with intermittent dizziness and double vision. No other focal neurologic deficits. Exam today is non-focal. - Advised to call [facility] to schedule a follow-up appointment. A new referral is not needed as the last visit was within three years, but will provide one if requested by their office.   Urticaria Assessment & Plan: - Hives are improving. Recent allergy  testing was negative for food and environmental allergens. Taking Allegra and hydroxyzine . - Continue Allegra as  needed. - Taper and discontinue hydroxyzine  due to risk of anticholinergic side effects. Counseled to reduce dose over several weeks and monitor for worsening hives.  Orders: -     CBC with  Differential/Platelet; Future  Primary hypertension -     CBC with Differential/Platelet; Future  Osteoporosis without current pathological fracture, unspecified osteoporosis type -     Comprehensive metabolic panel with GFR; Future  Hyperlipidemia, unspecified hyperlipidemia type -     Comprehensive metabolic panel with GFR; Future -     Lipid panel; Future -     Hemoglobin A1c; Future  Vitamin D  deficiency  History of TIA (transient ischemic attack) -     CBC with Differential/Platelet; Future     Return in about 4 months (around 05/31/2024) for physical.    Toribio MARLA Slain, MD

## 2024-02-07 DIAGNOSIS — S32010D Wedge compression fracture of first lumbar vertebra, subsequent encounter for fracture with routine healing: Secondary | ICD-10-CM | POA: Diagnosis not present

## 2024-03-04 DIAGNOSIS — S32010D Wedge compression fracture of first lumbar vertebra, subsequent encounter for fracture with routine healing: Secondary | ICD-10-CM | POA: Diagnosis not present

## 2024-03-12 DIAGNOSIS — M25511 Pain in right shoulder: Secondary | ICD-10-CM | POA: Diagnosis not present

## 2024-03-12 DIAGNOSIS — H5711 Ocular pain, right eye: Secondary | ICD-10-CM | POA: Diagnosis not present

## 2024-03-12 DIAGNOSIS — N760 Acute vaginitis: Secondary | ICD-10-CM | POA: Diagnosis not present

## 2024-03-12 DIAGNOSIS — R399 Unspecified symptoms and signs involving the genitourinary system: Secondary | ICD-10-CM | POA: Diagnosis not present

## 2024-03-18 DIAGNOSIS — S42031A Displaced fracture of lateral end of right clavicle, initial encounter for closed fracture: Secondary | ICD-10-CM | POA: Diagnosis not present

## 2024-03-18 DIAGNOSIS — S42001A Fracture of unspecified part of right clavicle, initial encounter for closed fracture: Secondary | ICD-10-CM | POA: Diagnosis not present

## 2024-03-27 ENCOUNTER — Telehealth: Payer: Self-pay | Admitting: *Deleted

## 2024-03-27 NOTE — Telephone Encounter (Signed)
 LVM informing pt that we are rescheduling her appointment on 06/03/24 due to the provider being out of the office.  We have rescheduled this to 06/06/24 @ 9:30am will also send a mychart message

## 2024-04-28 ENCOUNTER — Encounter: Payer: Self-pay | Admitting: Family Medicine

## 2024-04-28 ENCOUNTER — Ambulatory Visit: Admitting: Family Medicine

## 2024-04-28 VITALS — BP 117/68 | HR 90 | Ht 65.0 in | Wt 138.1 lb

## 2024-04-28 DIAGNOSIS — I680 Cerebral amyloid angiopathy: Secondary | ICD-10-CM

## 2024-04-28 DIAGNOSIS — G8929 Other chronic pain: Secondary | ICD-10-CM

## 2024-04-28 DIAGNOSIS — M810 Age-related osteoporosis without current pathological fracture: Secondary | ICD-10-CM

## 2024-04-28 DIAGNOSIS — S42009D Fracture of unspecified part of unspecified clavicle, subsequent encounter for fracture with routine healing: Secondary | ICD-10-CM

## 2024-04-28 DIAGNOSIS — Z1211 Encounter for screening for malignant neoplasm of colon: Secondary | ICD-10-CM

## 2024-04-28 DIAGNOSIS — M545 Low back pain, unspecified: Secondary | ICD-10-CM

## 2024-04-28 DIAGNOSIS — E854 Organ-limited amyloidosis: Secondary | ICD-10-CM | POA: Diagnosis not present

## 2024-04-28 DIAGNOSIS — Z0000 Encounter for general adult medical examination without abnormal findings: Secondary | ICD-10-CM

## 2024-04-28 DIAGNOSIS — Z1212 Encounter for screening for malignant neoplasm of rectum: Secondary | ICD-10-CM

## 2024-04-28 NOTE — Progress Notes (Signed)
" ° °  Established Patient Office Visit  Subjective   Patient ID: Deborah [name], female    DOB: [birth year]  Age: 79 y.o. MRN: [id number]  Chief Complaint  Patient presents with   Dementia     History of Present Illness   Deborah [name] is a 79 year old female who presents for a follow-up visit after a fall resulting in a broken collarbone.  She experienced a fall right before Thanksgiving that caused a painful broken collarbone and a black eye. She has been using a sling. She did not lose consciousness and reports tripping over an object on the floor.  She has ongoing speech difficulty that has improved since the last visit. She is scheduled to see neurology in May.  She has dizziness and a shuffling gait, especially in the mornings, with fluctuating severity. She denies vision changes, weakness, numbness, or trouble using her arms or legs.  She has chronic mid-back pain due to a cracked vertebra, which is managed by an orthopedist.  Her medications include Zetia , Crestor , a multivitamin, Lexapro , diltiazem , vitamin D , aspirin , and weekly Fosamax  for bone density.          The 10-year ASCVD risk score (Arnett DK, et al., 2019) is: 25.2%  Health Maintenance Due  Topic Date Due   Pneumococcal Vaccine: 50+ Years (2 of 2 - PCV20 or PCV21) 08/06/2015   Colonoscopy  10/29/2022   Medicare Annual Wellness (AWV)  08/01/2023   COVID-19 Vaccine (5 - 2025-26 season) 12/17/2023      Objective:     BP 117/68   Pulse 90   Ht 5' 5 (1.651 m)   Wt 138 lb 1.9 oz (62.7 kg)   SpO2 97%   BMI 22.98 kg/m    Physical Exam   Gen: alert, oriented Pulm: no respiratory distress Psych: pleasant affect NEUROLOGICAL: Cranial nerves grossly intact. Moves all extremities without gross motor deficit. Normal finger to nose test.  Msk: strength equal b/l       No results found for any visits on 04/28/24.      Assessment & Plan:   Cerebral amyloid angiopathy (HCC) Assessment &  Plan: Aphasic symptoms improved.  Still with intermittent dizziness.  Has neurology f/u in may.     Encounter for colorectal cancer screening -     Ambulatory referral to Gastroenterology  Osteoporosis without current pathological fracture, unspecified osteoporosis type Assessment & Plan: Confirmed by bone density scan. Managed with vitamin D , calcium , and Fosamax . - Continue vitamin D  and calcium  supplementation. - Continue Fosamax  once weekly. - Schedule bone density scan in June 2026.   Chronic midline low back pain without sciatica Assessment & Plan: Chronic low back pain due to vertebral fracture. Managed by orthopedist. - Continue follow-up with orthopedist for management of vertebral fracture.   Closed nondisplaced fracture of clavicle with routine healing, unspecified laterality, unspecified part of clavicle, subsequent encounter Assessment & Plan: Recent fracture managed with a sling. Pain improving, no surgery required. - Continue follow-up with orthopedist for clavicle fracture management.   Healthcare maintenance Assessment & Plan: Mammogram and bone density scan up to date. - Sent referral for colonoscopy. - Continue mammograms every two years. - Continue bone density scans every two years.            Return in about 6 months (around 10/26/2024) for f/u chronic issues.    Toribio MARLA Slain, MD  "

## 2024-04-28 NOTE — Patient Instructions (Signed)
" °  VISIT SUMMARY: Today we discussed your recent fall and broken collarbone, ongoing speech difficulties, dizziness, and chronic back pain. We reviewed your medications and made plans for follow-up care.  YOUR PLAN: OSTEOPOROSIS: You have osteoporosis confirmed by a bone density scan. -Continue taking vitamin D  and calcium  supplements. -Continue taking Fosamax  once a week. -Schedule your next bone density scan in June 2026.  - I would keep your appointment in may with your neurologist.    LOW BACK PAIN WITH VERTEBRAL FRACTURE: You have chronic low back pain due to a vertebral fracture. -Continue following up with your orthopedist for management of your vertebral fracture.  DIZZINESS AND GAIT DISTURBANCE: You have intermittent dizziness and gait disturbance with normal blood pressure and glucose levels. -Monitor your symptoms and report any changes.  GENERAL HEALTH MAINTENANCE: We reviewed your general health maintenance. -A referral for a colonoscopy has been sent. -Continue getting mammograms every two years. -Continue getting bone density scans every two years.     "

## 2024-04-29 DIAGNOSIS — S42009A Fracture of unspecified part of unspecified clavicle, initial encounter for closed fracture: Secondary | ICD-10-CM | POA: Insufficient documentation

## 2024-04-29 DIAGNOSIS — Z0000 Encounter for general adult medical examination without abnormal findings: Secondary | ICD-10-CM | POA: Insufficient documentation

## 2024-04-29 NOTE — Assessment & Plan Note (Signed)
 Recent fracture managed with a sling. Pain improving, no surgery required. - Continue follow-up with orthopedist for clavicle fracture management.

## 2024-04-29 NOTE — Assessment & Plan Note (Signed)
 Mammogram and bone density scan up to date. - Sent referral for colonoscopy. - Continue mammograms every two years. - Continue bone density scans every two years.

## 2024-04-29 NOTE — Assessment & Plan Note (Signed)
 Chronic low back pain due to vertebral fracture. Managed by orthopedist. - Continue follow-up with orthopedist for management of vertebral fracture.

## 2024-04-29 NOTE — Assessment & Plan Note (Signed)
 Confirmed by bone density scan. Managed with vitamin D , calcium , and Fosamax . - Continue vitamin D  and calcium  supplementation. - Continue Fosamax  once weekly. - Schedule bone density scan in June 2026.

## 2024-04-29 NOTE — Assessment & Plan Note (Signed)
 Aphasic symptoms improved.  Still with intermittent dizziness.  Has neurology f/u in may.

## 2024-05-08 ENCOUNTER — Other Ambulatory Visit: Payer: Self-pay | Admitting: Family Medicine

## 2024-05-08 DIAGNOSIS — M810 Age-related osteoporosis without current pathological fracture: Secondary | ICD-10-CM

## 2024-05-27 ENCOUNTER — Other Ambulatory Visit

## 2024-06-03 ENCOUNTER — Encounter: Admitting: Family Medicine

## 2024-06-06 ENCOUNTER — Encounter: Admitting: Family Medicine

## 2024-09-01 ENCOUNTER — Ambulatory Visit: Admitting: Diagnostic Neuroimaging

## 2024-10-28 ENCOUNTER — Ambulatory Visit: Admitting: Family Medicine
